# Patient Record
Sex: Male | Born: 1940 | Race: White | Hispanic: No | Smoking: Current some day smoker
Health system: Southern US, Community
[De-identification: ages and names within clinical notes are randomized; demographics above are authoritative.]

## PROBLEM LIST (undated history)

## (undated) DIAGNOSIS — R112 Nausea with vomiting, unspecified: Secondary | ICD-10-CM

## (undated) DIAGNOSIS — R079 Chest pain, unspecified: Secondary | ICD-10-CM

## (undated) DIAGNOSIS — M79605 Pain in left leg: Principal | ICD-10-CM

## (undated) DIAGNOSIS — F329 Major depressive disorder, single episode, unspecified: Secondary | ICD-10-CM

## (undated) DIAGNOSIS — L089 Local infection of the skin and subcutaneous tissue, unspecified: Secondary | ICD-10-CM

## (undated) DIAGNOSIS — I10 Essential (primary) hypertension: Secondary | ICD-10-CM

## (undated) DIAGNOSIS — I209 Angina pectoris, unspecified: Secondary | ICD-10-CM

## (undated) DIAGNOSIS — N189 Chronic kidney disease, unspecified: Secondary | ICD-10-CM

## (undated) DIAGNOSIS — Z8719 Personal history of other diseases of the digestive system: Secondary | ICD-10-CM

## (undated) DIAGNOSIS — E7800 Pure hypercholesterolemia, unspecified: Secondary | ICD-10-CM

## (undated) DIAGNOSIS — E785 Hyperlipidemia, unspecified: Secondary | ICD-10-CM

## (undated) DIAGNOSIS — N1831 Chronic kidney disease, stage 3a: Secondary | ICD-10-CM

## (undated) DIAGNOSIS — I4891 Unspecified atrial fibrillation: Secondary | ICD-10-CM

## (undated) DIAGNOSIS — K219 Gastro-esophageal reflux disease without esophagitis: Secondary | ICD-10-CM

## (undated) DIAGNOSIS — R351 Nocturia: Secondary | ICD-10-CM

## (undated) DIAGNOSIS — I499 Cardiac arrhythmia, unspecified: Secondary | ICD-10-CM

## (undated) DIAGNOSIS — I4892 Unspecified atrial flutter: Secondary | ICD-10-CM

## (undated) DIAGNOSIS — M1990 Unspecified osteoarthritis, unspecified site: Secondary | ICD-10-CM

## (undated) DIAGNOSIS — K5120 Ulcerative (chronic) proctitis without complications: Secondary | ICD-10-CM

## (undated) DIAGNOSIS — F32A Depression, unspecified: Secondary | ICD-10-CM

## (undated) DIAGNOSIS — Z9889 Other specified postprocedural states: Secondary | ICD-10-CM

## (undated) DIAGNOSIS — A389 Scarlet fever, uncomplicated: Secondary | ICD-10-CM

## (undated) DIAGNOSIS — M79604 Pain in right leg: Secondary | ICD-10-CM

## (undated) DIAGNOSIS — B958 Unspecified staphylococcus as the cause of diseases classified elsewhere: Secondary | ICD-10-CM

## (undated) DIAGNOSIS — R32 Unspecified urinary incontinence: Secondary | ICD-10-CM

## (undated) DIAGNOSIS — M4800 Spinal stenosis, site unspecified: Secondary | ICD-10-CM

## (undated) HISTORY — PX: ESOPHAGEAL DILATION: SHX303

## (undated) HISTORY — DX: Gastro-esophageal reflux disease without esophagitis: K21.9

## (undated) HISTORY — PX: INNER EAR SURGERY: SHX679

## (undated) HISTORY — DX: Scarlet fever, uncomplicated: A38.9

## (undated) HISTORY — PX: X-STOP IMPLANTATION: SHX2677

## (undated) HISTORY — PX: CATARACT EXTRACTION W/ INTRAOCULAR LENS  IMPLANT, BILATERAL: SHX1307

## (undated) HISTORY — DX: Pain in right leg: M79.604

## (undated) HISTORY — DX: Pain in right leg: M79.605

## (undated) HISTORY — DX: Ulcerative (chronic) proctitis without complications: K51.20

## (undated) HISTORY — DX: Unspecified atrial flutter: I48.92

## (undated) HISTORY — DX: Spinal stenosis, site unspecified: M48.00

## (undated) HISTORY — DX: Chest pain, unspecified: R07.9

## (undated) HISTORY — DX: Hyperlipidemia, unspecified: E78.5

## (undated) HISTORY — PX: KNEE ARTHROSCOPY: SUR90

## (undated) HISTORY — DX: Pure hypercholesterolemia, unspecified: E78.00

## (undated) HISTORY — DX: Essential (primary) hypertension: I10

## (undated) HISTORY — DX: Unspecified atrial fibrillation: I48.91

---
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
## 1960-06-03 DIAGNOSIS — I4891 Unspecified atrial fibrillation: Secondary | ICD-10-CM

## 1960-06-03 HISTORY — DX: Unspecified atrial fibrillation: I48.91

## 1995-06-04 HISTORY — PX: CARDIAC CATHETERIZATION: SHX172

## 1999-10-22 ENCOUNTER — Emergency Department (HOSPITAL_COMMUNITY): Admission: EM | Admit: 1999-10-22 | Discharge: 1999-10-22 | Payer: Self-pay | Admitting: Emergency Medicine

## 1999-10-30 ENCOUNTER — Encounter: Payer: Self-pay | Admitting: Family Medicine

## 1999-10-30 ENCOUNTER — Encounter: Admission: RE | Admit: 1999-10-30 | Discharge: 1999-10-30 | Payer: Self-pay | Admitting: Family Medicine

## 2005-06-03 HISTORY — PX: ANKLE RECONSTRUCTION: SHX1151

## 2008-03-02 ENCOUNTER — Observation Stay (HOSPITAL_COMMUNITY): Admission: EM | Admit: 2008-03-02 | Discharge: 2008-03-03 | Payer: Self-pay | Admitting: Emergency Medicine

## 2008-03-03 HISTORY — PX: CARDIAC CATHETERIZATION: SHX172

## 2009-06-03 HISTORY — PX: COLONOSCOPY: SHX174

## 2010-10-15 ENCOUNTER — Other Ambulatory Visit: Payer: Self-pay | Admitting: Family Medicine

## 2010-10-15 DIAGNOSIS — M79609 Pain in unspecified limb: Secondary | ICD-10-CM

## 2010-10-16 NOTE — Discharge Summary (Signed)
NAMEBENYAMIN, [name] NO.:  [id number]   MEDICAL RECORD NO.:  [id number]          PATIENT TYPE:  OBV   LOCATION:  2013                         FACILITY:  [facility]   PHYSICIAN:  Cassell Clement, M.D. DATE OF BIRTH:  09-Nov-1940   DATE OF ADMISSION:  03/02/2008  DATE OF DISCHARGE:  03/03/2008                               DISCHARGE SUMMARY   FINAL DIAGNOSES:  1. Chest pain, myocardial infarction ruled out.  2. Normal coronary arteries by cardiac catheterization.  3. Hyperlipidemia.  4. Ulcerative proctitis.  5. Hypertensive cardiovascular disease.  6. Past history of paroxysmal atrial flutter.   OPERATIONS PERFORMED:  Left heart cardiac catheterization and coronary  angiogram by Dr. Peter Swaziland on March 03, 2008.   HISTORY:  This 70 year old gentleman was admitted as an emergency on  March 02, 2008, after experiencing severe substernal chest pain,  dyspnea, and diaphoresis __________ at home after exertion.  He took  aspirin and 1 sublingual nitroglycerin, had the feeling of  partial  improvement.  He had had a cardiac catheterization in 1998, which was  normal.  He has a past history of hypertension and started on Coreg, and  is followed Dr. [name].  He also has a history of ulcerative  proctitis, and is followed by Dr. Sherin Quarry and is on tapering dose of  steroids.   PHYSICAL EXAMINATION:  On admission, essentially unremarkable.  Vital  signs were stable.   HOSPITAL COURSE:  The patient was admitted to telemetry.  EKG showed no  acute changes.  Cardiac enzymes showed no elevation.  Telemetry remained  stable.  On the day of admission, the patient underwent cardiac  catheterization by Dr. Peter Swaziland, which showed normal coronary  arteries and normal left ventricular function and normal ejection  fraction of 55% with no wall motion abnormalities.  The patient  tolerated procedure well.  His groin remained stable postop and he is  expected to be  discharged home improved in the evening of the cath to be  followed by Dr. [name] in followup.  His LDL is 129 with an HDL  of 33 and we are adding low-dose Lipitor to his regimen.   DISCHARGE MEDICATIONS:  1. Aspirin 81 mg daily.  2. Lipitor 20 mg daily.  3. Carvedilol 6.25 mg twice a day.  4. Requip 0.25 mg daily.  5. Prednisone 5 mg daily or as directed by Dr. Sherin Quarry.  6. Dipentum 1500 mg twice a day.  7. Multivitamin 1 daily.  8. Fish oil capsule 1000 mg daily.  9. Coenzyme Q10 daily.  10.Vitamin C daily.  11.Nitrostat __________ sublingually p.r.n. for chest pain.   CONDITION ON DISCHARGE:  Improved.           ______________________________  Cassell Clement, M.D.     TB/MEDQ  D:  03/03/2008  T:  03/04/2008  Job:  [id number]   cc:   Peter M. Swaziland, M.D.  Pam Drown, M.D.  Tasia Catchings, M.D.

## 2010-10-16 NOTE — H&P (Signed)
[name], [name] NO.:  [id number]   MEDICAL RECORD NO.:  [id number]          PATIENT TYPE:  INP   LOCATION:  2013                         FACILITY:  [facility]   PHYSICIAN:  Cassell Clement, M.D. DATE OF BIRTH:  1940/09/27   DATE OF ADMISSION:  03/02/2008  DATE OF DISCHARGE:                              HISTORY & PHYSICAL   CHIEF COMPLAINT:  Chest pain.   HISTORY:  This is a 70 year old married Caucasian gentleman admitted  with substernal chest pain.  He had been shoveling some gravel in his  yard this morning and became fatigued and dyspneic.  He stopped  shoveling, went into lie down and developed shortness of breath and  substernal chest pain.  He estimates the pain lasted several hours.  At  worst, the pain was at 6/10.  Family reports that the patient appeared  somewhat diaphoretic and a ashen in color.  He denied nausea, vomiting,  or any radiation to the arms or neck or to the back.  He took 324 mg of  aspirin and sublingual nitroglycerin, and the intensive pain dropped  from 6/10 to 2/10 and subsequently after arrival in the emergency room  by ambulance has disappeared altogether.  The patient does not have any  prior history of known coronary artery disease.  He did have a previous  cardiac catheterization in 1998, which did not show any significant  obstructive disease according to the patient.   The patient does have past history of hypertension and has been on  Coreg.  His cholesterol status is unknown.  He has a history of  ulcerative proctitis and Dr. Sherin Quarry follows him and he has been on a  tapering dose of prednisone.  The patient also reports that a year ago,  he had paroxysmal atrial flutter.  He went to the [facility]  and his flutter converted spontaneously while his physician was  listening to his heart.   FAMILY HISTORY:  [name] mother died in her late 44s and had a  history of hypertension.  [name] father died  at 44, and died of  prostate cancer.   SOCIAL HISTORY:  The patient is married.  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  He quit smoking cigarettes 40 years ago, but smokes a pipe.  He drinks occasional alcohol.   ALLERGIES:  1. PENICILLIN.  2. COMPAZINE.   PRESENT MEDICATIONS:  1. Carvedilol 6.25 mg twice a day.  2. ReQuip, generic 0.25 mg once a day.  3. Prednisone 5 mg daily.  4. Lorazepam p.r.n.  5. Dipentum 1500 mg daily.  6. Coenzyme Q10 200 mg daily.  7. Multivitamin 1 tablet daily.  8. Fish oil 1000 mg daily.  9. Aspirin 81 mg daily.   REVIEW OF SYSTEMS:  He has not had any cough or sputum production, or  hemoptysis.  He had no pleuritic chest pain.  Gastrointestinal function  has been normal with normal bowel movements and no visible blood.  As  noted, he  has been on a tapering dose prednisone initially 20 mg a day 4  weeks, then 15 for a week, then 10 for a week, and now 5 mg daily.  The  patient's genitourinary history reveals that he has had increasing  urinary frequency for the past week with decreased urine volume.  He has  also had a remote history of prostatitis a year or 2 ago.   PHYSICAL EXAMINATION:  VITAL SIGNS:  His blood pressure is 156/84,  initially and dropped to 121/78 after therapy.  HEENT:  Carotids are normal.  Jugular venous pressure normal.  Thyroid  normal.  CHEST:  Clear.  HEART:  No murmur, gallop, rub, or click.  ABDOMEN:  Soft and nontender.  Liver and spleen not enlarged.  EXTREMITIES:  Good pulses.  No flares.  No edema.   LABORATORY DATA:  His electrocardiogram shows sinus bradycardia and is  nonacute.  The chest x-ray shows no evidence of CHF and his heart size  is normal.  His initial cardiac enzymes are normal.   IMPRESSION:  1. Possible acute coronary syndrome with chest pain resolved.  2. Hypertensive cardiovascular disease.  3. Ulcerative proctitis,  presently on tapering steroids.  4. Past history of paroxysmal atrial flutter.  5. Possible urinary tract infection.   DISPOSITION:  Admit to Dr. Patty Sermons to Telemetry.  We will continue the  IV heparin, which has been started in the emergency room.  We will  continue his beta-blocker.  We will continue daily aspirin.  Will use  nitroglycerin p.r.n.  We will assess fasting lipids in a.m.  We will  anticipate cardiac catheterization tomorrow by Dr. Peter Swaziland, who  incidentally happens to be his wife's cardiologist as well.           ______________________________  Cassell Clement, M.D.     TB/MEDQ  D:  03/02/2008  T:  03/03/2008  Job:  [id number]   cc:   Peter M. Swaziland, M.D.  Pam Drown, M.D.  Tasia Catchings, M.D.

## 2010-10-18 ENCOUNTER — Ambulatory Visit
Admission: RE | Admit: 2010-10-18 | Discharge: 2010-10-18 | Disposition: A | Discharge status: Home / Self Care | Payer: Self-pay | Source: Ambulatory Visit | Attending: Family Medicine | Admitting: Family Medicine

## 2010-10-18 DIAGNOSIS — M79609 Pain in unspecified limb: Secondary | ICD-10-CM

## 2010-11-01 ENCOUNTER — Ambulatory Visit
Admission: RE | Admit: 2010-11-01 | Discharge: 2010-11-01 | Disposition: A | Discharge status: Home / Self Care | Payer: Medicare Other | Source: Ambulatory Visit | Attending: Neurosurgery | Admitting: Neurosurgery

## 2010-11-01 ENCOUNTER — Other Ambulatory Visit: Payer: Self-pay | Admitting: Neurosurgery

## 2010-11-01 DIAGNOSIS — M79606 Pain in leg, unspecified: Secondary | ICD-10-CM

## 2010-11-26 ENCOUNTER — Encounter (HOSPITAL_COMMUNITY)
Admission: RE | Admit: 2010-11-26 | Discharge: 2010-11-26 | Disposition: A | Discharge status: Home / Self Care | Payer: Medicare Other | Source: Ambulatory Visit | Attending: Neurosurgery | Admitting: Neurosurgery

## 2010-11-26 ENCOUNTER — Other Ambulatory Visit (HOSPITAL_COMMUNITY): Payer: Self-pay | Admitting: Neurosurgery

## 2010-11-26 ENCOUNTER — Ambulatory Visit (HOSPITAL_COMMUNITY)
Admission: RE | Admit: 2010-11-26 | Discharge: 2010-11-26 | Disposition: A | Discharge status: Home / Self Care | Payer: Medicare Other | Source: Ambulatory Visit | Attending: Neurosurgery | Admitting: Neurosurgery

## 2010-11-26 DIAGNOSIS — Z01810 Encounter for preprocedural cardiovascular examination: Secondary | ICD-10-CM | POA: Insufficient documentation

## 2010-11-26 DIAGNOSIS — Z01812 Encounter for preprocedural laboratory examination: Secondary | ICD-10-CM | POA: Insufficient documentation

## 2010-11-26 DIAGNOSIS — Z01818 Encounter for other preprocedural examination: Secondary | ICD-10-CM | POA: Insufficient documentation

## 2010-11-26 DIAGNOSIS — M48061 Spinal stenosis, lumbar region without neurogenic claudication: Secondary | ICD-10-CM

## 2010-11-26 DIAGNOSIS — M47814 Spondylosis without myelopathy or radiculopathy, thoracic region: Secondary | ICD-10-CM | POA: Insufficient documentation

## 2010-11-26 LAB — CBC
HCT: 39.5 % (ref 39.0–52.0)
MCHC: 34.7 g/dL (ref 30.0–36.0)
MCV: 93.4 fL (ref 78.0–100.0)
Platelets: 170 10*3/uL (ref 150–400)
RDW: 13.6 % (ref 11.5–15.5)

## 2010-11-26 LAB — BASIC METABOLIC PANEL
BUN: 22 mg/dL (ref 6–23)
Creatinine, Ser: 1.19 mg/dL (ref 0.50–1.35)
GFR calc Af Amer: >60 mL/min (ref >60)
GFR calc non Af Amer: >60 mL/min (ref >60)

## 2010-12-02 HISTORY — PX: LUMBAR LAMINECTOMY: SHX95

## 2010-12-04 ENCOUNTER — Inpatient Hospital Stay (HOSPITAL_COMMUNITY): Payer: Medicare Other

## 2010-12-04 ENCOUNTER — Inpatient Hospital Stay (HOSPITAL_COMMUNITY)
Admission: RE | Admit: 2010-12-04 | Discharge: 2010-12-05 | DRG: 491 | Disposition: A | Discharge status: Home / Self Care | Payer: Medicare Other | Source: Ambulatory Visit | Attending: Neurosurgery | Admitting: Neurosurgery

## 2010-12-04 DIAGNOSIS — I10 Essential (primary) hypertension: Secondary | ICD-10-CM | POA: Diagnosis present

## 2010-12-04 DIAGNOSIS — F17200 Nicotine dependence, unspecified, uncomplicated: Secondary | ICD-10-CM | POA: Diagnosis present

## 2010-12-04 DIAGNOSIS — M48062 Spinal stenosis, lumbar region with neurogenic claudication: Principal | ICD-10-CM | POA: Diagnosis present

## 2010-12-04 DIAGNOSIS — I4891 Unspecified atrial fibrillation: Secondary | ICD-10-CM | POA: Diagnosis present

## 2010-12-04 DIAGNOSIS — R339 Retention of urine, unspecified: Secondary | ICD-10-CM | POA: Diagnosis present

## 2010-12-06 NOTE — Op Note (Signed)
  NAME:  [name], [name] NO.:  [id number]  MEDICAL RECORD NO.:  [id number]  LOCATION:                                 FACILITY:  PHYSICIAN:  Hilda Lias, M.D.   DATE OF BIRTH:  1940-08-30  DATE OF PROCEDURE: DATE OF DISCHARGE:                              OPERATIVE REPORT   PREOPERATIVE DIAGNOSIS:  Lumbar stenosis, L3, L5, S1 with neurogenic claudication.  POSTOPERATIVE DIAGNOSES:  Lumbar stenosis, L3, L5, S1 with neurogenic claudication.  PROCEDURE:  Bilateral L3, L4, L5 laminectomy and foraminotomy. Microscope.  SURGEON:  Hilda Lias, MD.  ASSISTANT:  Danae Orleans. Venetia Maxon, MD.  CLINICAL HISTORY:  The patient is a 70 year old gentleman, complaining of back pain, worse to both legs, associated with weakness.  The pain is getting worse and he noticed that now with less walking.  He developed more pain.  He has arterial Doppler which was negative.  X-rays show severe stenosis at the level of L4-5 and moderate at the level of L3-4. Surgery was advised.  DESCRIPTION OF PROCEDURE:  The patient was taken to the OR, and after intubation, he was positioned in prone manner.  The back was cleaned with DuraPrep.  Drapes were applied.  Midline incision was made through the skin and subcutaneous tissue down to the muscle.  The muscle retracted laterally.  X-rays showed that the clamp was at the level of L3 and L5.  Then, with the Leksell, we removed the spinal process of L3, L4, L5 as well as the interspinous ligament.  Then with the help of the drill as well as the 2- and 3-mm Kerrison punch, we did bilateral laminectomy at L3, L4, L5.  The patient had quite a bit of thickening of the yellow ligament at those two level being the worse level of L4-5 with there was quite a bit of thin of the dura mater.  Decompression was done laterally and then with the help of the microscope we did a foraminotomy to decompress the L3, L4 and L5 nerve root.  At the end, we had  plenty of space with plenty of opening for the foramen.  Valsalva maneuver up to 50 was negative.  Then the area was irrigated.  Fentanyl and Depo-Medrol were left in the dura space and the wound was closed with Vicryl and Steri-Strips.          ______________________________ Hilda Lias, M.D.     EB/MEDQ  D:  12/04/2010  T:  12/05/2010  Job:  [id number]  Electronically Signed by Hilda Lias M.D. on 12/06/2010 01:32:06 PM

## 2010-12-06 NOTE — H&P (Signed)
  NAME:  [name], [name] NO.:  [id number]  MEDICAL RECORD NO.:  [id number]  LOCATION:                                 FACILITY:  PHYSICIAN:  Hilda Lias, M.D.   DATE OF BIRTH:  [birth year]  DATE OF ADMISSION: DATE OF DISCHARGE:                             HISTORY & PHYSICAL   [name] is a gentleman who came to my office about 5 weeks ago complaining of back pain radiating to both legs, which got worse with walking.  At the beginning, it was felt that it was secondary to the cholesterol medication that he was taking, but after he was switched to different medication, he continued to get worse.  The patient had a workup and because of finding, he is being admitted for surgery.  PAST MEDICAL HISTORY:  Negative.  PRIOR SURGERIES:  He has had knee surgery, cardiac catheterization, right femur surgery.  He is allergic to PENICILLIN and COMPAZINE.  He is taking Prilosec, Xanax, Coreg, lisinopril.  FAMILY HISTORY:  Positive for cancer of the prostate, congestive heart failure.  SOCIAL HISTORY:  He does not smoke.  He drinks socially.  He is 5 feet 9 inches, 240 pounds.  REVIEW OF SYSTEMS:  Positive for back pain, leg pain.  PHYSICAL EXAMINATION:  HEAD, EYES, EARS, NOSE, AND THROAT:  Normal. NECK:  Normal. LUNGS:  Clear. HEART:  Heart sounds normal. ABDOMEN:  Normal. EXTREMITIES:  Normal pulse. NEUROLOGIC:  Mental status normal.  Cranial nerves normal.  Strength with no dorsiflexion of both feet.  Straight leg raising is positive bilaterally at 60 degrees.  The lumbar spine MRI showed that he has a severe stenosis at the level of L3-L4, L4-L5.  The patient had arterial Doppler, which was negative.  CLINICAL IMPRESSION:  Lumbar stenosis with neurogenic claudication.  RECOMMENDATION:  The patient is being admitted for surgery.  Procedure would be bilateral L3-L4, L4-L5 laminectomy and foraminotomy.  There is a possibility that we might do a  posterolateral arthrodesis.  He knows the risk of the surgery including possibility of infection, CSF leak, worsening pain, and need for further surgery.          ______________________________ Hilda Lias, M.D.     EB/MEDQ  D:  12/04/2010  T:  12/04/2010  Job:  [id number]  Electronically Signed by Hilda Lias M.D. on 12/06/2010 01:32:03 PM

## 2010-12-13 ENCOUNTER — Other Ambulatory Visit: Payer: Self-pay | Admitting: Neurosurgery

## 2010-12-13 ENCOUNTER — Ambulatory Visit
Admission: RE | Admit: 2010-12-13 | Discharge: 2010-12-13 | Disposition: A | Discharge status: Home / Self Care | Payer: Medicare Other | Source: Ambulatory Visit | Attending: Neurosurgery | Admitting: Neurosurgery

## 2010-12-13 DIAGNOSIS — R51 Headache: Secondary | ICD-10-CM

## 2010-12-19 ENCOUNTER — Other Ambulatory Visit: Payer: Self-pay | Admitting: Otolaryngology

## 2010-12-19 DIAGNOSIS — H7090 Unspecified mastoiditis, unspecified ear: Secondary | ICD-10-CM

## 2010-12-21 ENCOUNTER — Ambulatory Visit
Admission: RE | Admit: 2010-12-21 | Discharge: 2010-12-21 | Disposition: A | Discharge status: Home / Self Care | Payer: Medicare Other | Source: Ambulatory Visit | Attending: Otolaryngology | Admitting: Otolaryngology

## 2010-12-21 DIAGNOSIS — H7090 Unspecified mastoiditis, unspecified ear: Secondary | ICD-10-CM

## 2011-03-04 LAB — CBC
HCT: 43.7
MCHC: 34
MCV: 96.7
Platelets: 169
RDW: 15.7 — ABNORMAL HIGH

## 2011-03-04 LAB — COMPREHENSIVE METABOLIC PANEL
Albumin: 3.3 — ABNORMAL LOW
BUN: 20
Calcium: 8.9
Chloride: 105
Creatinine, Ser: 1.11
Total Bilirubin: 1.7 — ABNORMAL HIGH
Total Protein: 5.6 — ABNORMAL LOW

## 2011-03-04 LAB — URINALYSIS, ROUTINE W REFLEX MICROSCOPIC
Nitrite: NEGATIVE
Specific Gravity, Urine: 1.008
pH: 7.5

## 2011-03-04 LAB — POCT I-STAT, CHEM 8
Calcium, Ion: 1.14
Chloride: 104
HCT: 46
TCO2: 30

## 2011-03-04 LAB — DIFFERENTIAL
Basophils Absolute: 0
Lymphocytes Relative: 17
Monocytes Absolute: 0.4
Neutro Abs: 5.4

## 2011-03-04 LAB — POCT CARDIAC MARKERS: Myoglobin, poc: 103

## 2011-03-04 LAB — APTT: aPTT: 26

## 2011-03-04 LAB — URINE CULTURE

## 2011-03-04 LAB — OCCULT BLOOD X 1 CARD TO LAB, STOOL: Fecal Occult Bld: NEGATIVE

## 2011-03-04 LAB — CK TOTAL AND CKMB (NOT AT ARMC)
CK, MB: 4.8 — ABNORMAL HIGH
Relative Index: 4.3 — ABNORMAL HIGH
Total CK: 111

## 2011-03-05 LAB — HEPARIN LEVEL (UNFRACTIONATED): Heparin Unfractionated: 1.11 — ABNORMAL HIGH

## 2011-03-05 LAB — CBC
MCHC: 34.6
Platelets: 148 — ABNORMAL LOW
RBC: 3.9 — ABNORMAL LOW
WBC: 9.8

## 2011-03-05 LAB — CARDIAC PANEL(CRET KIN+CKTOT+MB+TROPI)
Relative Index: INVALID
Total CK: 86

## 2011-03-05 LAB — C-REACTIVE PROTEIN: CRP: 0.1 — ABNORMAL LOW (ref <0.6)

## 2011-03-05 LAB — LIPID PANEL: Cholesterol: 181

## 2011-05-16 ENCOUNTER — Ambulatory Visit: Payer: Medicare Other | Attending: Family Medicine | Admitting: Physical Therapy

## 2011-05-16 DIAGNOSIS — M545 Low back pain, unspecified: Secondary | ICD-10-CM | POA: Insufficient documentation

## 2011-05-16 DIAGNOSIS — M25559 Pain in unspecified hip: Secondary | ICD-10-CM | POA: Insufficient documentation

## 2011-05-16 DIAGNOSIS — IMO0001 Reserved for inherently not codable concepts without codable children: Secondary | ICD-10-CM | POA: Insufficient documentation

## 2011-05-22 ENCOUNTER — Ambulatory Visit: Payer: Medicare Other

## 2011-05-29 ENCOUNTER — Ambulatory Visit: Payer: Medicare Other

## 2011-06-03 ENCOUNTER — Ambulatory Visit: Payer: Medicare Other

## 2011-06-05 ENCOUNTER — Ambulatory Visit: Payer: Medicare Other | Attending: Family Medicine | Admitting: Physical Therapy

## 2011-06-05 DIAGNOSIS — M545 Low back pain, unspecified: Secondary | ICD-10-CM | POA: Insufficient documentation

## 2011-06-05 DIAGNOSIS — IMO0001 Reserved for inherently not codable concepts without codable children: Secondary | ICD-10-CM | POA: Insufficient documentation

## 2011-06-05 DIAGNOSIS — M25559 Pain in unspecified hip: Secondary | ICD-10-CM | POA: Insufficient documentation

## 2011-06-10 ENCOUNTER — Ambulatory Visit: Payer: Medicare Other | Admitting: Physical Therapy

## 2011-06-12 ENCOUNTER — Ambulatory Visit: Payer: Medicare Other | Admitting: Physical Therapy

## 2011-06-17 ENCOUNTER — Ambulatory Visit: Payer: Medicare Other | Admitting: Physical Therapy

## 2011-06-19 ENCOUNTER — Ambulatory Visit: Payer: Medicare Other | Admitting: Physical Therapy

## 2011-06-24 ENCOUNTER — Encounter: Payer: Medicare Other | Admitting: Physical Therapy

## 2011-06-26 ENCOUNTER — Ambulatory Visit: Payer: Medicare Other | Admitting: Physical Therapy

## 2011-07-01 ENCOUNTER — Ambulatory Visit: Payer: Medicare Other | Admitting: Physical Therapy

## 2011-07-03 ENCOUNTER — Ambulatory Visit: Payer: Medicare Other | Admitting: Physical Therapy

## 2011-07-17 ENCOUNTER — Other Ambulatory Visit: Payer: Self-pay | Admitting: Orthopaedic Surgery

## 2011-07-17 DIAGNOSIS — M5410 Radiculopathy, site unspecified: Secondary | ICD-10-CM

## 2011-07-21 ENCOUNTER — Ambulatory Visit
Admission: RE | Admit: 2011-07-21 | Discharge: 2011-07-21 | Disposition: A | Discharge status: Home / Self Care | Payer: Medicare Other | Source: Ambulatory Visit | Attending: Orthopaedic Surgery | Admitting: Orthopaedic Surgery

## 2011-07-21 DIAGNOSIS — M5410 Radiculopathy, site unspecified: Secondary | ICD-10-CM

## 2011-07-21 MED ORDER — GADOBENATE DIMEGLUMINE 529 MG/ML IV SOLN
20.0000 mL | Freq: Once | INTRAVENOUS | Status: AC | PRN
  Administered 2011-07-21: 20 mL via INTRAVENOUS

## 2012-02-15 IMAGING — CT CT TEMPORAL BONES W/O CM
4 of 5 series · 17 of 30 positions shown, 19 images · non-contrast
Comparison: None.

CLINICAL DATA: Right ear mastoiditis.  Decreased hearing on the
right side.

CT TEMPORAL BONES WITHOUT CONTRAST
TECHNIQUE: Axial and coronal plane CT imaging of the petrous
temporal bones was performed with thin-collimation image
reconstruction.  No intravenous contrast was administered.
Multiplanar CT image reconstructions were also generated.

[Series 3: ax mag right · axial · 0.19mm/px · z∈[-18,+28]mm · 5 of 224 slices shown, 7 images]
[im 38/224  brain]
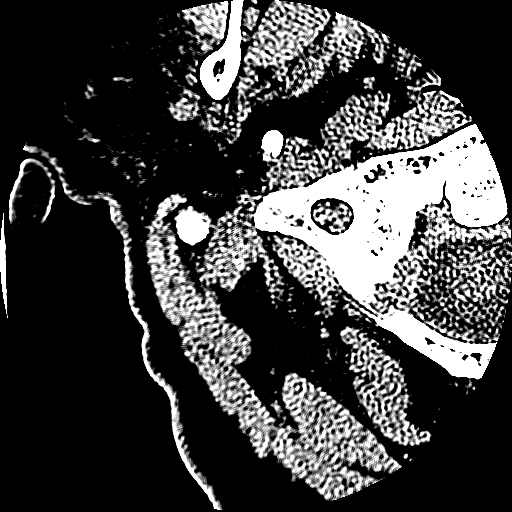
[im 38/224  bone]
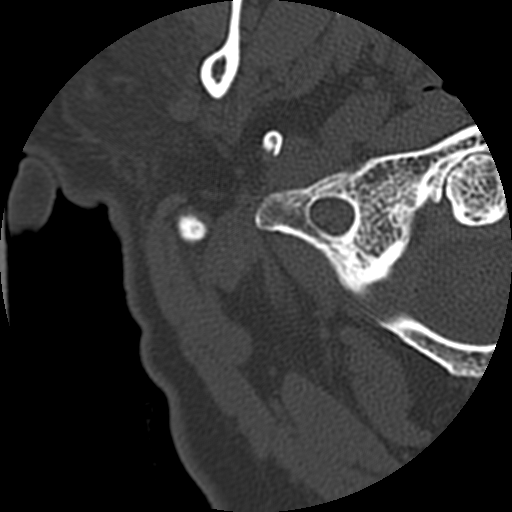
[im 75/224  bone]
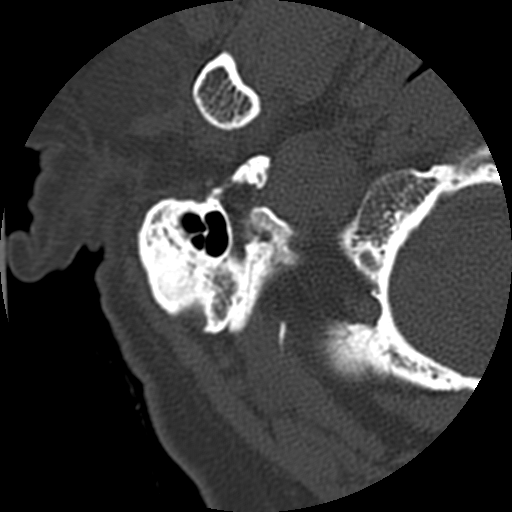
[im 112/224  bone]
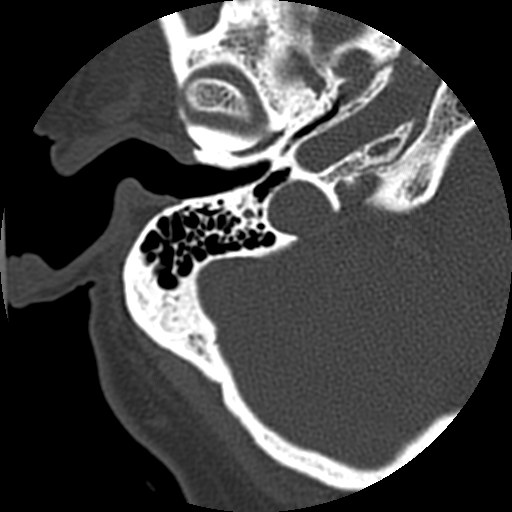
[im 149/224  bone]
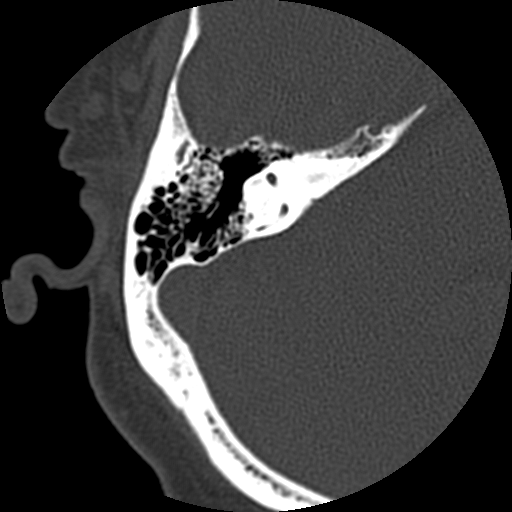
[im 186/224  brain]
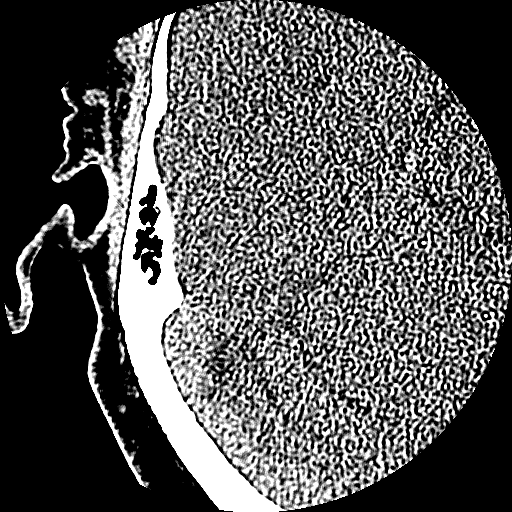
[im 186/224  bone]
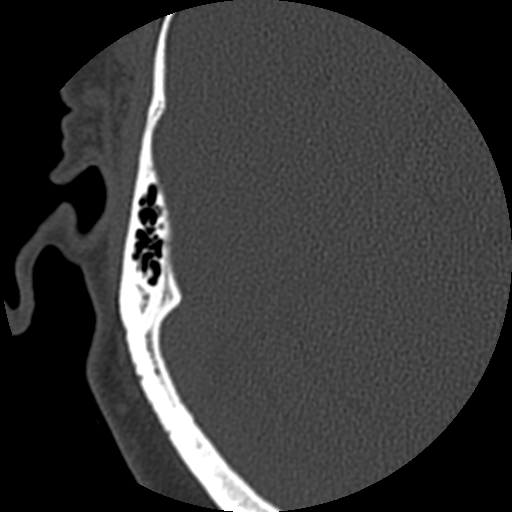

[Series 4: ax mag left · axial · 0.19mm/px · z∈[-16,+26]mm · 4 of 224 slices shown]
[im 45/224  bone]
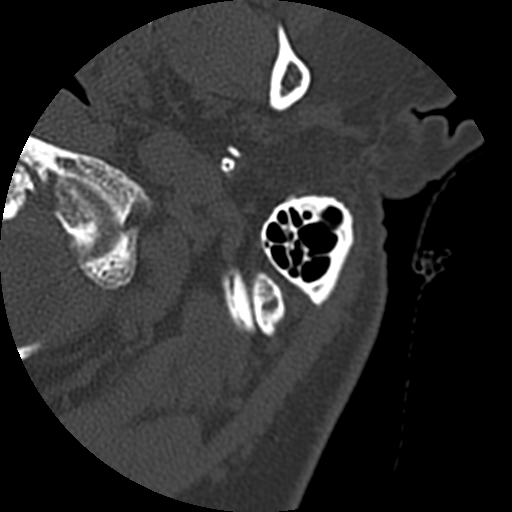
[im 90/224  bone]
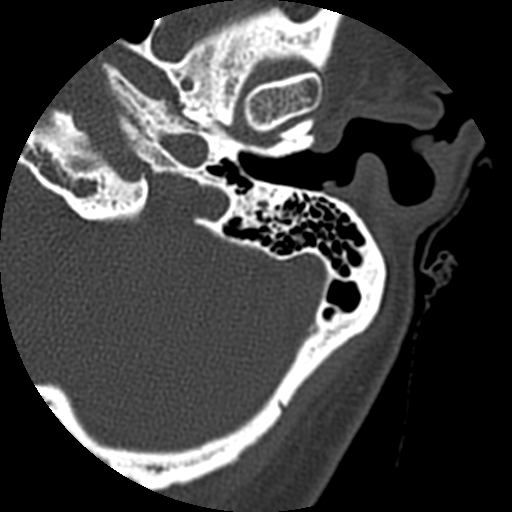
[im 134/224  bone]
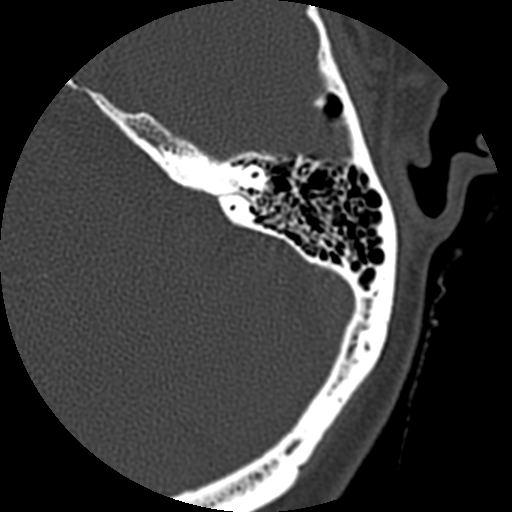
[im 179/224  bone]
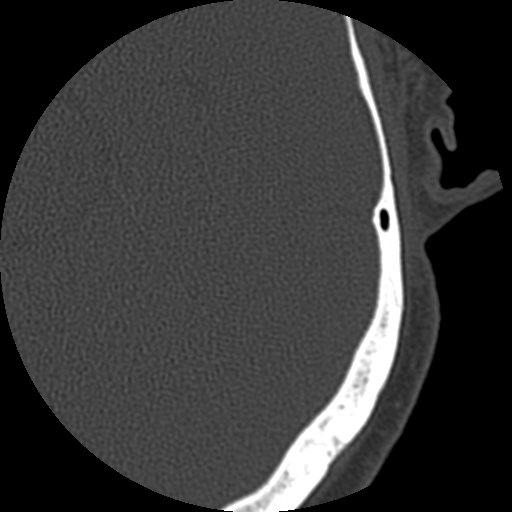

[Series 300: rt cor · coronal · 0.19mm/px · 5 of 266 slices shown]
[im 45/266  bone]
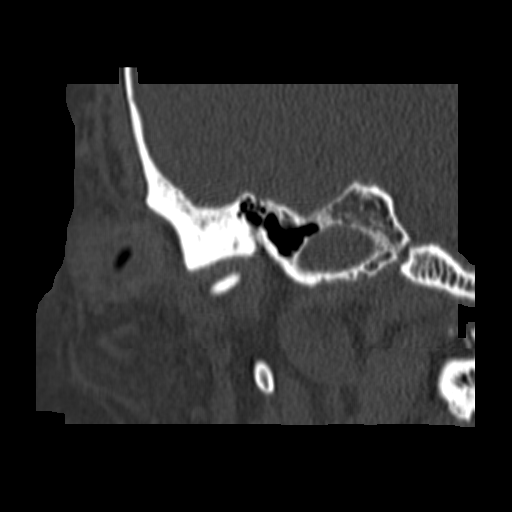
[im 89/266  bone]
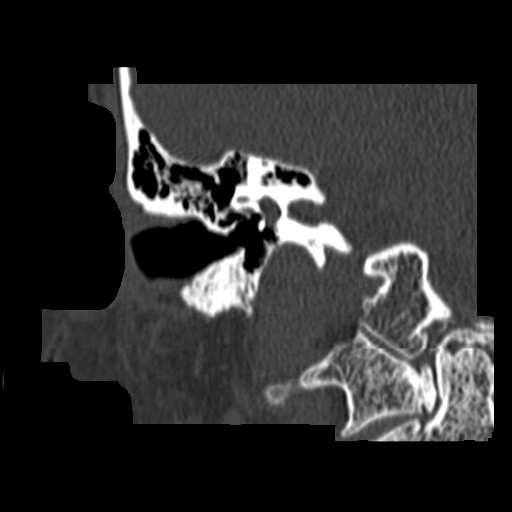
[im 133/266  bone]
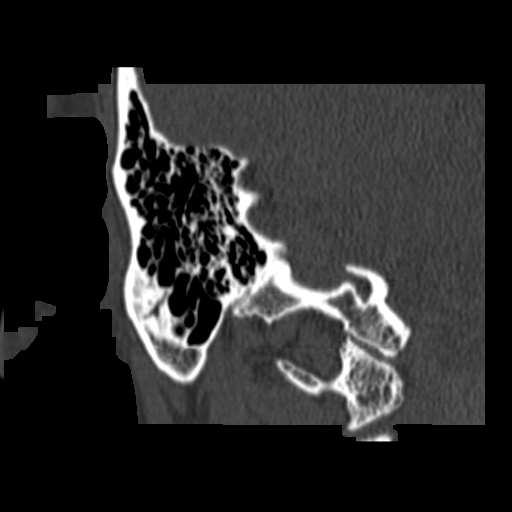
[im 177/266  bone]
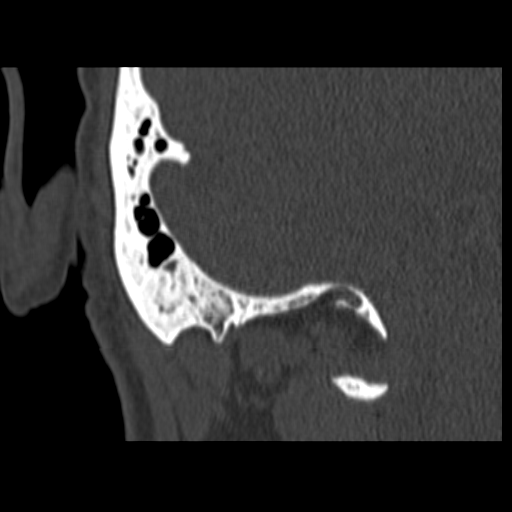
[im 221/266  bone]
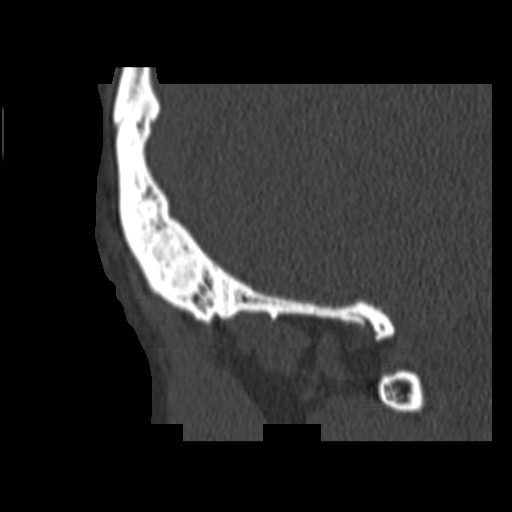

[Series 400: lt cor · coronal · 0.19mm/px · 3 of 264 slices shown]
[im 44/264  bone]
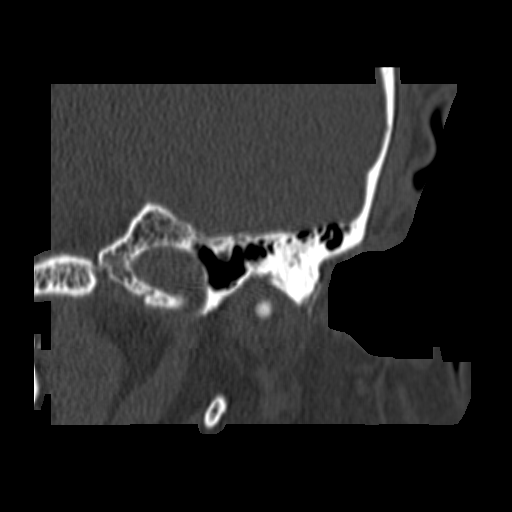
[im 88/264  bone]
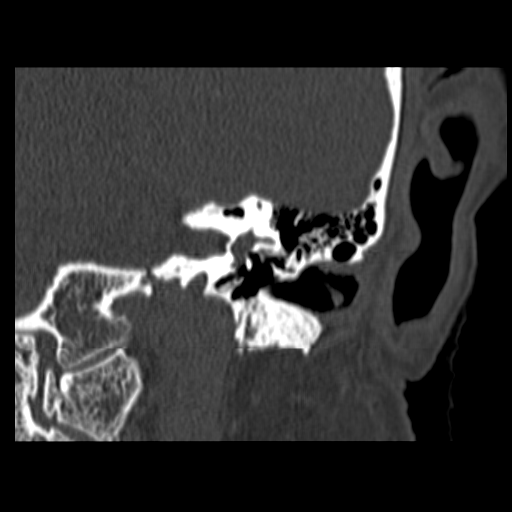
[im 132/264  bone]
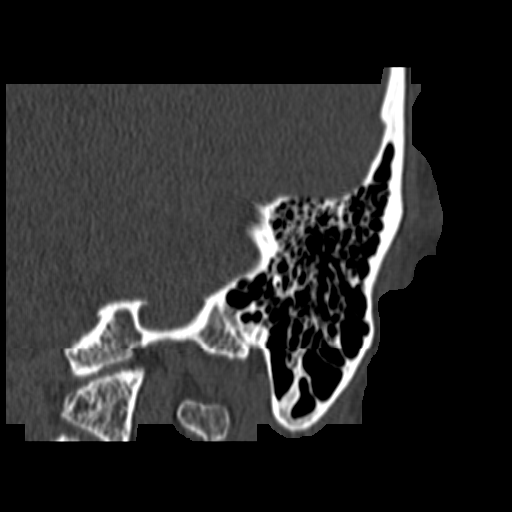

[17 of 30 positions shown; findings below may reference images not displayed]

FINDINGS: The right external auditory canal is clear.  The tympanic
membrane is visualized and appears to be intact.  The middle ear
ossicles are normally formed and articulating.  The round window is
patent.  Minimal fluid is present along the inferior aspect of the
right mastoid air cells posteriorly.  There is no coalescense or
sclerosis.  The vast majority of the mastoid air cells are clear.
The middle ear cavity is clear.  The inner ear structures are
normally formed.  Incidental note is made of a high riding jugular
bulb.  The sigmoid plate is intact.  The internal auditory canal
and vestibular aqueduct are within normal limits.

The left external auditory canal is within normal limits.  The
tympanic membrane is visualized and appears to be intact.  The
middle ear ossicles are normally formed and articulating.  The oval
window is patent.  The mastoid air cells and middle ear cavity are
clear.  The inner ear structures are normally formed.  The
vestibular aqueduct and internal auditory canal are within normal
limits.
IMPRESSION: 1.  Minimal fluid of the right mastoid air cells is likely
incidental.  This is unlikely to represent any significant
mastoiditis.
2.  No coalescense of air cells or sclerosis to suggest chronic
mastoiditis.
3.  Incidental note is made of a high riding jugular bulb on the
right.  The sigmoid plate is intact.
4.  Unremarkable left temporal bone.

## 2012-10-29 ENCOUNTER — Other Ambulatory Visit: Payer: Self-pay | Admitting: Otolaryngology

## 2012-10-29 DIAGNOSIS — H905 Unspecified sensorineural hearing loss: Secondary | ICD-10-CM

## 2012-10-29 DIAGNOSIS — H903 Sensorineural hearing loss, bilateral: Secondary | ICD-10-CM

## 2012-11-23 ENCOUNTER — Ambulatory Visit
Admission: RE | Admit: 2012-11-23 | Discharge: 2012-11-23 | Disposition: A | Discharge status: Home / Self Care | Payer: Medicare Other | Source: Ambulatory Visit | Attending: Otolaryngology | Admitting: Otolaryngology

## 2012-11-23 DIAGNOSIS — H903 Sensorineural hearing loss, bilateral: Secondary | ICD-10-CM

## 2012-11-23 DIAGNOSIS — H905 Unspecified sensorineural hearing loss: Secondary | ICD-10-CM

## 2012-11-23 MED ORDER — GADOBENATE DIMEGLUMINE 529 MG/ML IV SOLN
10.0000 mL | Freq: Once | INTRAVENOUS | Status: AC | PRN
  Administered 2012-11-23: 10 mL via INTRAVENOUS

## 2013-05-24 DIAGNOSIS — E782 Mixed hyperlipidemia: Secondary | ICD-10-CM | POA: Insufficient documentation

## 2013-05-24 DIAGNOSIS — F1920 Other psychoactive substance dependence, uncomplicated: Secondary | ICD-10-CM | POA: Insufficient documentation

## 2013-07-05 ENCOUNTER — Encounter (INDEPENDENT_AMBULATORY_CARE_PROVIDER_SITE_OTHER): Payer: Self-pay

## 2013-07-05 ENCOUNTER — Encounter: Payer: Self-pay | Admitting: Cardiology

## 2013-07-05 ENCOUNTER — Ambulatory Visit (INDEPENDENT_AMBULATORY_CARE_PROVIDER_SITE_OTHER): Payer: Medicare Other | Admitting: Cardiology

## 2013-07-05 VITALS — BP 120/74 | HR 65 | Ht 70.5 in | Wt 247.0 lb

## 2013-07-05 DIAGNOSIS — E782 Mixed hyperlipidemia: Secondary | ICD-10-CM

## 2013-07-05 DIAGNOSIS — M4800 Spinal stenosis, site unspecified: Secondary | ICD-10-CM | POA: Insufficient documentation

## 2013-07-05 DIAGNOSIS — N183 Chronic kidney disease, stage 3 unspecified: Secondary | ICD-10-CM | POA: Insufficient documentation

## 2013-07-05 DIAGNOSIS — I10 Essential (primary) hypertension: Secondary | ICD-10-CM

## 2013-07-05 DIAGNOSIS — K219 Gastro-esophageal reflux disease without esophagitis: Secondary | ICD-10-CM | POA: Insufficient documentation

## 2013-07-05 DIAGNOSIS — H9190 Unspecified hearing loss, unspecified ear: Secondary | ICD-10-CM | POA: Insufficient documentation

## 2013-07-05 DIAGNOSIS — R7301 Impaired fasting glucose: Secondary | ICD-10-CM | POA: Insufficient documentation

## 2013-07-05 DIAGNOSIS — F339 Major depressive disorder, recurrent, unspecified: Secondary | ICD-10-CM | POA: Insufficient documentation

## 2013-07-05 DIAGNOSIS — G4700 Insomnia, unspecified: Secondary | ICD-10-CM | POA: Insufficient documentation

## 2013-07-05 DIAGNOSIS — R1310 Dysphagia, unspecified: Secondary | ICD-10-CM | POA: Insufficient documentation

## 2013-07-05 DIAGNOSIS — K5120 Ulcerative (chronic) proctitis without complications: Secondary | ICD-10-CM | POA: Insufficient documentation

## 2013-07-05 DIAGNOSIS — R000 Tachycardia, unspecified: Secondary | ICD-10-CM | POA: Insufficient documentation

## 2013-07-05 DIAGNOSIS — M1990 Unspecified osteoarthritis, unspecified site: Secondary | ICD-10-CM | POA: Insufficient documentation

## 2013-07-05 MED ORDER — OMEGA-3-ACID ETHYL ESTERS 1 G PO CAPS: 2.0000 g | ORAL_CAPSULE | Freq: Two times a day (BID) | ORAL | Status: DC

## 2013-07-05 NOTE — Patient Instructions (Signed)
Stop fenofibrate  Start Lovaza 2 grams twice a day.  Work on getting your weight down and exercising as much as possible  I will see you in 3 months with fasting lab work.

## 2013-07-06 NOTE — Progress Notes (Signed)
[name] Date of Birth: [birth year] Medical Record #[id number]  History of Present Illness: [name] is seen at the request of Dr. Addison Lank for evaluation of hyperlipidemia. He is a 73 yo WM with history of mixed hyperlipidemia, HTN, and obesity. In review of his lab work cholesterol has ranged from 127-211 on different treatments with triglycerides of 134-300. LDL appears to be about 129 on no Rx. He has been intolerant to statins due to severe myalgias. This includes lipitor at a dose of 20 mg daily and Crestor 10 mg 3 days a week. Welchol apparently made no change in his lipid levels. Niacin was associated with severe flushing. Currently on fenofibrate. He was concerned about taking fish oil due to concerns about mercury. [name] has no history of vascular disease. He had normal cardiac caths in 1998 and 2009. No history of PAD or CVA/TIA. He does have difficulty with his weight and activity is limited by a number of arthritic/orthopedic problems. He apparently had afib in 1962 but no documented recurrence.  No current outpatient prescriptions on file prior to visit.   No current facility-administered medications on file prior to visit.    Allergies  Allergen Reactions  . Atorvastatin   . Gemfibrozil   . Monascus Purpureus Went Yeast   . Penicillins   . Prochlorperazine   . Rosuvastatin     Past Medical History  Diagnosis Date  . Chest pain     Myocardial infarction ruled out  . Hyperlipidemia   . Ulcerative proctitis   . Hypertensive cardiovascular disease   . Paroxysmal atrial flutter     History of Paroxysmal atrial flutter   . Scarlet fever 1945  . Atrial fibrillation 1962  . Esophageal reflux   . HTN (hypertension)     Past Surgical History  Procedure Laterality Date  . Cardiac catheterization  03/03/2008    Left heart cardiac catheterization and coronary  . Knee arthroscopy  1991, 1995  . Ankle reconstruction Right 2007    [place]  . Lumbar laminectomy  12/2010    . Dex insection Left 12/2012    ear  . Cardiac catheterization  1997    Dr Wynonia Lawman    History  Smoking status  . Former Smoker  Smokeless tobacco  . Not on file    Comment: quit 1980    History  Alcohol Use  . Yes    Comment: 1-3 weekly    Family History  Problem Relation Age of Onset  . Prostate cancer Father   . Heart attack Brother   . Kidney cancer Brother   . Brain cancer Brother     Review of Systems: As noted in HPI.  All other systems were reviewed and are negative.  Physical Exam: BP 120/74  Pulse 65  Ht 5' 10.5" (1.791 m)  Wt 247 lb (112.038 kg)  BMI 34.93 kg/m2 He is an obese WM in NAD.  HEENT: [place]/AT, PERRL, EOMI. Sclera are clear. Oropharynx is clear.  Neck: no JVD, bruits, thyromegaly, or adenopathy. Lungs: clear CV: RRR, normal S1-2, no gallop, murmur,click Abdomen: obese, soft, NT, no masses, bruits, or HSM Ext: no cyanosis or edema. Pulses are 2+. Skin: warm and dry, no xanthalaismas or tendon xanthomas. Neuro: alert and oriented x 3. CN II-XII intact  LABORATORY DATA: 05/24/13 Cholesterol-211, triglycerides-300, HDL-24, LDL-126 Complete chemistry panel-normal. Creatinine 1.33  Ecg: Normal  Assessment / Plan: 1. Mixed hyperlipidemia. Multiple drug intolerances including statins and niacin. Little benefit demonstrated with Welchol. While  calculated 10 year risk based on age, lipids, and history of HTN is high, I think this risk is lower based on normal cardiac cath in past. Options may include very low dose statin- ie crestor 5 mg twice a week, Zetia, fibrates, and fish oil. Lifestyle modification is very important here with need for weight loss and increased aerobic activity. I have recommended stopping fenofibrate for now. Will start Lovaza 4 grams daily. Will focus on weight loss. I will see in 3 months and we will check a fasting lipoprotein analysis at that time.   2. HTN- controlled.  3. Normal cardiac cath 2009.

## 2013-10-07 ENCOUNTER — Other Ambulatory Visit: Payer: Medicare Other

## 2013-10-08 ENCOUNTER — Other Ambulatory Visit (INDEPENDENT_AMBULATORY_CARE_PROVIDER_SITE_OTHER): Payer: Medicare Other

## 2013-10-08 DIAGNOSIS — I10 Essential (primary) hypertension: Secondary | ICD-10-CM

## 2013-10-08 DIAGNOSIS — E782 Mixed hyperlipidemia: Secondary | ICD-10-CM

## 2013-10-08 LAB — BASIC METABOLIC PANEL
BUN: 27 mg/dL — ABNORMAL HIGH (ref 6–23)
CALCIUM: 9 mg/dL (ref 8.4–10.5)
CO2: 28 meq/L (ref 19–32)
CREATININE: 1.5 mg/dL (ref 0.4–1.5)
Chloride: 106 mEq/L (ref 96–112)
GFR: 47.04 mL/min — ABNORMAL LOW (ref >60.00)
GLUCOSE: 98 mg/dL (ref 70–99)
Potassium: 4.8 mEq/L (ref 3.5–5.1)
Sodium: 140 mEq/L (ref 135–145)

## 2013-10-08 LAB — HEPATIC FUNCTION PANEL
ALK PHOS: 54 U/L (ref 39–117)
ALT: 19 U/L (ref 0–53)
AST: 23 U/L (ref 0–37)
Albumin: 3.5 g/dL (ref 3.5–5.2)
BILIRUBIN TOTAL: 0.5 mg/dL (ref 0.2–1.2)
Bilirubin, Direct: 0 mg/dL (ref 0.0–0.3)
Total Protein: 6.2 g/dL (ref 6.0–8.3)

## 2013-10-11 LAB — NMR LIPOPROFILE WITH LIPIDS
CHOLESTEROL, TOTAL: 214 mg/dL — AB (ref <200)
HDL PARTICLE NUMBER: 16.7 umol/L — AB (ref >=30.5)
HDL Size: 8.1 nm — ABNORMAL LOW (ref >=9.2)
HDL-C: 27 mg/dL — AB (ref >=40)
LDL CALC: 145 mg/dL — AB (ref <100)
LDL Particle Number: 3115 nmol/L — ABNORMAL HIGH (ref <1000)
LDL Size: 19.6 nm — ABNORMAL LOW (ref >20.5)
LP-IR SCORE: 73 — AB (ref <=45)
Large HDL-P: <1.3 umol/L — ABNORMAL LOW (ref >=4.8)
Large VLDL-P: 2.9 nmol/L — ABNORMAL HIGH (ref <=2.7)
Triglycerides: 210 mg/dL — ABNORMAL HIGH (ref <150)
VLDL Size: 47 nm — ABNORMAL HIGH (ref <=46.6)

## 2013-10-13 ENCOUNTER — Other Ambulatory Visit: Payer: Self-pay

## 2013-10-13 DIAGNOSIS — E782 Mixed hyperlipidemia: Secondary | ICD-10-CM

## 2013-10-13 MED ORDER — EZETIMIBE 10 MG PO TABS: 10.0000 mg | ORAL_TABLET | Freq: Every day | ORAL | Status: DC

## 2013-10-14 ENCOUNTER — Ambulatory Visit (INDEPENDENT_AMBULATORY_CARE_PROVIDER_SITE_OTHER): Payer: Medicare Other | Admitting: Cardiology

## 2013-10-14 ENCOUNTER — Encounter: Payer: Self-pay | Admitting: Cardiology

## 2013-10-14 VITALS — BP 137/87 | HR 80 | Ht 70.0 in | Wt 251.8 lb

## 2013-10-14 DIAGNOSIS — N183 Chronic kidney disease, stage 3 unspecified: Secondary | ICD-10-CM

## 2013-10-14 DIAGNOSIS — E782 Mixed hyperlipidemia: Secondary | ICD-10-CM

## 2013-10-14 DIAGNOSIS — I10 Essential (primary) hypertension: Secondary | ICD-10-CM

## 2013-10-14 NOTE — Patient Instructions (Signed)
Go ahead and start Zetia and keep your appointment with Gay Filler in the lipid clinic.  I will see you in 4 months.

## 2013-10-14 NOTE — Progress Notes (Signed)
[name] Date of Birth: 08-20-1940 Medical Record #[id number]  History of Present Illness: [name] is seen for follow up of hyperlipidemia. He is a 73 yo WM with history of mixed hyperlipidemia, HTN, and obesity. In review of his lab work cholesterol has ranged from 127-211 on different treatments with triglycerides of 134-300. LDL appears to be about 129 on no Rx. He has been intolerant to statins due to severe myalgias. This includes lipitor at a dose of 20 mg daily and Crestor 10 mg 3 days a week. Welchol apparently made no change in his lipid levels. Niacin was associated with severe flushing.  He was started on Lovaza in February. He has no history of vascular disease. He had normal cardiac caths in 1998 and 2009. No history of PAD or CVA/TIA. He does have difficulty with his weight and activity is limited by a number of arthritic/orthopedic problems. He apparently had afib in 1962 but no documented recurrence.  Current Outpatient Prescriptions on File Prior to Visit  Medication Sig Dispense Refill  . ALPRAZolam (XANAX) 0.25 MG tablet Take 0.25 mg by mouth at bedtime as needed for anxiety.      . Ascorbic Acid (VITAMIN C PO) Take 1 capsule by mouth daily.       Marland Kitchen aspirin 81 MG tablet Take 81 mg by mouth daily.      . carvedilol (COREG) 12.5 MG tablet Take 12.5 mg by mouth 2 (two) times daily with a meal.      . COENZYME Q-10 PO Take 1 capsule by mouth daily.       . cyclobenzaprine (FLEXERIL) 5 MG tablet Take 1 tablet by mouth as needed.       . ezetimibe (ZETIA) 10 MG tablet Take 1 tablet (10 mg total) by mouth daily.  30 tablet  6  . HYDROcodone-acetaminophen (NORCO/VICODIN) 5-325 MG per tablet Take 1 tablet by mouth at bedtime.       Marland Kitchen lisinopril (PRINIVIL,ZESTRIL) 20 MG tablet Take 20 mg by mouth daily.      . Magnesium 250 MG TABS Take 1 tablet by mouth daily.       . MULTIPLE VITAMIN PO Take 1 capsule by mouth daily.       . nitroGLYCERIN (NITROSTAT) 0.4 MG SL tablet Place 0.4  mg under the tongue every 5 (five) minutes as needed for chest pain.      Marland Kitchen omega-3 acid ethyl esters (LOVAZA) 1 G capsule Take 2 capsules (2 g total) by mouth 2 (two) times daily.  120 capsule  11  . omeprazole (PRILOSEC OTC) 20 MG tablet Take 20 mg by mouth daily.      . ondansetron (ZOFRAN) 4 MG tablet Take 4 mg by mouth every 8 (eight) hours as needed for nausea or vomiting.       No current facility-administered medications on file prior to visit.    Allergies  Allergen Reactions  . Prochlorperazine   . Atorvastatin   . Gemfibrozil   . Monascus Purpureus Went Yeast   . Penicillins   . Rosuvastatin     Past Medical History  Diagnosis Date  . Chest pain     Myocardial infarction ruled out  . Hyperlipidemia   . Ulcerative proctitis   . Hypertensive cardiovascular disease   . Paroxysmal atrial flutter     History of Paroxysmal atrial flutter   . Scarlet fever 1945  . Atrial fibrillation 1962  . Esophageal reflux   . HTN (hypertension)  Past Surgical History  Procedure Laterality Date  . Cardiac catheterization  03/03/2008    Left heart cardiac catheterization and coronary  . Knee arthroscopy  1991, 1995  . Ankle reconstruction Right 2007    [place]  . Lumbar laminectomy  12/2010  . Dex insection Left 12/2012    ear  . Cardiac catheterization  1997    Dr Wynonia Lawman    History  Smoking status  . Former Smoker  Smokeless tobacco  . Not on file    Comment: quit 1980    History  Alcohol Use  . Yes    Comment: 1-3 weekly    Family History  Problem Relation Age of Onset  . Prostate cancer Father   . Heart attack Brother   . Kidney cancer Brother   . Brain cancer Brother     Review of Systems: As noted in HPI.  All other systems were reviewed and are negative.  Physical Exam: BP 137/87  Pulse 80  Ht 5\' 10"  (1.778 m)  Wt 251 lb 12.8 oz (114.216 kg)  BMI 36.13 kg/m2 He is an obese WM in NAD.  HEENT: /AT, PERRL, EOMI. Sclera are clear. Oropharynx is  clear.  Neck: no JVD, bruits, thyromegaly, or adenopathy. Lungs: clear CV: RRR, normal S1-2, no gallop, murmur,click Abdomen: obese, soft, NT, no masses, bruits, or HSM Ext: no cyanosis or edema. Pulses are 2+. Skin: warm and dry, no xanthalaismas or tendon xanthomas. Neuro: alert and oriented x 3. CN II-XII intact  LABORATORY DATA: Lab Results  Component Value Date   WBC 7.6 11/26/2010   HGB 13.7 11/26/2010   HCT 39.5 11/26/2010   PLT 170 11/26/2010   GLUCOSE 98 10/08/2013   CHOL  Value: 181        ATP III CLASSIFICATION:  <200     mg/dL   Desirable  200-239  mg/dL   Borderline High  >=240    mg/dL   High 03/03/2008   TRIG 210* 10/08/2013   HDL 32* 03/03/2008   LDLCALC 145* 10/08/2013   ALT 19 10/08/2013   AST 23 10/08/2013   NA 140 10/08/2013   K 4.8 10/08/2013   CL 106 10/08/2013   CREATININE 1.5 10/08/2013   BUN 27* 10/08/2013   CO2 28 10/08/2013   INR 0.9 03/02/2008   Recent lipoprotein analysis reviewed in Epic. LDL particle number 3115. Small LDL particle number >2300. Triglycerides 210.   Ecg: Normal  Assessment / Plan: 1. Mixed hyperlipidemia. Multiple drug intolerances including statins and niacin. Little benefit demonstrated with Welchol. While calculated 10 year risk based on age, lipids, and history of HTN is high, I think this risk is lower based on normal cardiac cath in past. Tiglycerides improved with Lovaza. Still markedly elevated LDL particle number with predominant small particles. Lifestyle modification is stressed with need for weight loss and increased aerobic activity.Will start Zetia 10 mg daily. Continue  Lovaza 4 grams daily. Will refer to lipid clinic.   2. HTN- controlled.  3. Normal cardiac cath 2009.

## 2013-10-19 ENCOUNTER — Ambulatory Visit (INDEPENDENT_AMBULATORY_CARE_PROVIDER_SITE_OTHER): Payer: Medicare Other | Admitting: Pharmacist

## 2013-10-19 DIAGNOSIS — Z79899 Other long term (current) drug therapy: Secondary | ICD-10-CM

## 2013-10-19 DIAGNOSIS — E782 Mixed hyperlipidemia: Secondary | ICD-10-CM

## 2013-10-19 MED ORDER — ROSUVASTATIN CALCIUM 10 MG PO TABS: 10.0000 mg | ORAL_TABLET | ORAL | Status: DC

## 2013-10-19 NOTE — Patient Instructions (Signed)
1.  Continue Zetia 10 mg once daily. 2.  In 4 weeks, start Crestor 10 mg once weekly since you tolerated this in the past. 3.  Recheck cholesterol and liver in 3 months (01/11/14 AM- fasting), and see Ysidro Evert 2 days later to review 01/13/14 at 10:00 am.

## 2013-10-19 NOTE — Progress Notes (Signed)
Patient is a pleasant 73 y.o. WM referred to lipid clinic by Dr. Martinique given elevated LDL-P number (3,000 nmol/L) and inability to tolerate lipid lowering therapy.  Patient started on Zetia 10 mg qd one week ago and tolerating this well thus far.  Patient tells me that he has a h/o spinal stenosis which he is now seeing a neurologist in [place] for, and feels this may have contributed to his leg aches in the past, and may not all have been from statins.  He is not certain about this association however.  He is scheduled to get an MRI on his back next week by neurology.  He has a cath in 2010 which was negative for CAD.  He was able to tolerate Crestor once week it seems by PCP, and titrated slowly (2012).  ONce he got to three times per week, he had to stop due to muscle aches in his legs.  Dr. Martinique wanted to know if he qualified for PCSK-9 inhibitor study.  RF:  HTN, age, low HDL, obesity, LDL-P number of 3,000 - LDL goal at least < 100, and LDL-P number goal preferably < 1300 nmol/L Meds:  Zetia 10 mg qd (started one week ago), Lovaza 4 g/d. Intolerant:  Lipitor 20 mg qd, Crestor tiw, Lopid, NIacin, Welchol, red yeast rice  Exercise:  Currently on treadmill for 2-3 days per week for 30 minutes.  Trying to get up to 7 days per week, for at least 30 minutes per day. Social history:  Former smoker.  Drinks alcohol occasionally. Diet:  Eats reasonable low fat diet already.  Labs:   10/2013:  LDL-P number 3115, LDL 145, TG 210, HDL 27, TC 214, LFTs normal, Glucose 98, Scr 1.5 (Lovaza 4 g/d only).  Zetia was started soon after these labs were drawn.  Current Outpatient Prescriptions  Medication Sig Dispense Refill  . ACETAMINOPHEN PO Take 200 mg by mouth every 6 (six) hours as needed.      . ALPRAZolam (XANAX) 0.25 MG tablet Take 0.25 mg by mouth at bedtime as needed for anxiety.      Marland Kitchen amitriptyline (ELAVIL) 25 MG tablet Take 25 mg by mouth daily.      . Ascorbic Acid (VITAMIN C PO) Take 1 capsule  by mouth daily.       Marland Kitchen aspirin 81 MG tablet Take 81 mg by mouth daily.      . carvedilol (COREG) 12.5 MG tablet Take 12.5 mg by mouth 2 (two) times daily with a meal.      . COENZYME Q-10 PO Take 1 capsule by mouth daily.       . cyclobenzaprine (FLEXERIL) 5 MG tablet Take 1 tablet by mouth as needed.       . ezetimibe (ZETIA) 10 MG tablet Take 1 tablet (10 mg total) by mouth daily.  30 tablet  6  . HYDROcodone-acetaminophen (NORCO/VICODIN) 5-325 MG per tablet Take 1 tablet by mouth at bedtime.       Marland Kitchen lisinopril (PRINIVIL,ZESTRIL) 20 MG tablet Take 20 mg by mouth daily.      . Magnesium 250 MG TABS Take 1 tablet by mouth daily.       . MULTIPLE VITAMIN PO Take 1 capsule by mouth daily.       . nitroGLYCERIN (NITROSTAT) 0.4 MG SL tablet Place 0.4 mg under the tongue every 5 (five) minutes as needed for chest pain.      Marland Kitchen omega-3 acid ethyl esters (LOVAZA) 1 G capsule Take  2 capsules (2 g total) by mouth 2 (two) times daily.  120 capsule  11  . omeprazole (PRILOSEC OTC) 20 MG tablet Take 20 mg by mouth daily.      . ondansetron (ZOFRAN) 4 MG tablet Take 4 mg by mouth every 8 (eight) hours as needed for nausea or vomiting.       No current facility-administered medications for this visit.   Allergies  Allergen Reactions  . Prochlorperazine   . Atorvastatin   . Gemfibrozil   . Monascus Purpureus Went Yeast   . Penicillins   . Rosuvastatin     Failed Crestor tiw due to leg aches (tolerated Crestor qweek and biw)  . Welchol [Colesevelam Hcl]     Leg aches   Family History  Problem Relation Age of Onset  . Prostate cancer Father   . Heart attack Brother   . Kidney cancer Brother   . Brain cancer Brother     .

## 2013-10-19 NOTE — Assessment & Plan Note (Signed)
Patient doesn't qualify for SPIRE clinical trial as he doesn't have a h/o CAD, diabetes, PAD, or CVA.  Given he did tolerate Crestor once weekly in the past, and as this can get a 20% LDL reduction, will plan on starting this again.  He recently started Zetia which can also get a 20% reduction, and he is tolerating this well also.  Patient to increase treadmill frequency, and continue Zetia 10 mg qd and Lovaza daily for now.  In 4 weeks, he is to start Crestor 10 mg once weekly (samples given to start).  Will recheck NMR in 3 months, and if LDL-P still elevated, could consider increasing Crestor or adding PCSK-9 inhibitor (if on the market) if needed.

## 2013-10-23 ENCOUNTER — Encounter: Payer: Self-pay | Admitting: Cardiology

## 2014-01-11 ENCOUNTER — Other Ambulatory Visit (INDEPENDENT_AMBULATORY_CARE_PROVIDER_SITE_OTHER): Payer: Medicare Other

## 2014-01-11 DIAGNOSIS — E782 Mixed hyperlipidemia: Secondary | ICD-10-CM

## 2014-01-11 DIAGNOSIS — Z79899 Other long term (current) drug therapy: Secondary | ICD-10-CM

## 2014-01-11 LAB — HEPATIC FUNCTION PANEL
ALT: 18 U/L (ref 0–53)
AST: 19 U/L (ref 0–37)
Albumin: 3.6 g/dL (ref 3.5–5.2)
Alkaline Phosphatase: 50 U/L (ref 39–117)
BILIRUBIN TOTAL: 0.7 mg/dL (ref 0.2–1.2)
Bilirubin, Direct: 0.1 mg/dL (ref 0.0–0.3)
Total Protein: 6.4 g/dL (ref 6.0–8.3)

## 2014-01-12 LAB — NMR LIPOPROFILE WITH LIPIDS
Cholesterol, Total: 135 mg/dL (ref <200)
HDL PARTICLE NUMBER: 20.7 umol/L — AB (ref >=30.5)
HDL Size: 8.4 nm — ABNORMAL LOW (ref >=9.2)
HDL-C: 24 mg/dL — AB (ref >=40)
LDL CALC: 68 mg/dL (ref <100)
LDL Particle Number: 1439 nmol/L — ABNORMAL HIGH (ref <1000)
LDL SIZE: 19.5 nm — AB (ref >20.5)
LP-IR SCORE: 67 — AB (ref <=45)
Large HDL-P: <1.3 umol/L — ABNORMAL LOW (ref >=4.8)
Large VLDL-P: 3.1 nmol/L — ABNORMAL HIGH (ref <=2.7)
Small LDL Particle Number: 1300 nmol/L — ABNORMAL HIGH (ref <=527)
TRIGLYCERIDES: 214 mg/dL — AB (ref <150)
VLDL Size: 44.8 nm (ref <=46.6)

## 2014-01-13 ENCOUNTER — Ambulatory Visit (INDEPENDENT_AMBULATORY_CARE_PROVIDER_SITE_OTHER): Payer: Medicare Other | Admitting: Pharmacist

## 2014-01-13 VITALS — Wt 247.0 lb

## 2014-01-13 DIAGNOSIS — Z79899 Other long term (current) drug therapy: Secondary | ICD-10-CM

## 2014-01-13 DIAGNOSIS — E782 Mixed hyperlipidemia: Secondary | ICD-10-CM

## 2014-01-13 MED ORDER — ROSUVASTATIN CALCIUM 10 MG PO TABS: ORAL_TABLET | ORAL | Status: DC

## 2014-01-13 NOTE — Progress Notes (Signed)
Patient is a pleasant 73 y.o. WM referred to lipid clinic by Dr. Martinique given elevated LDL-P number (3,000 nmol/L) and inability to tolerate lipid lowering therapy.  Patient added Crestor 10 mg once weekly to his Zetia and Lovaza in 10/2013, and is tolerating this regimen well.  He denies any muscle aches or joint stiffness.  Patient tells me that he has a h/o spinal stenosis which he is now seeing a neurologist in [place] for, and feels this may have contributed to his leg aches in the past, and may not all have been from statins.  He is not certain about this association however, but tolerating Crestor weekly well.  He has a cath in 2010 which was negative for CAD.  In the past he was able to tolerate Crestor 1-2 times per week, but once he got to three times per week, he had to stop due to muscle aches in his legs. He doesn't qualify for PCSK-9 inhibitor use.  RF:  HTN, age, low HDL, obesity, LDL-P number of 3,000 - LDL goal at least < 100, and LDL-P number goal preferably < 1300 nmol/L Meds:  Zetia 10 mg qd, Crestor 10 mg once weekly, Lovaza 4 g/d. Intolerant:  Lipitor 20 mg qd, Crestor tiw, Lopid, NIacin, Welchol, red yeast rice  Exercise:  Was doingtreadmill for 2-3 days per week for 30 minutes, however due to ankle pain from a fracture 8 years ago, he is limited to only 5 minutes per day.  Social history:  Former smoker.  Drinks alcohol occasionally. Diet:  Eats reasonable low fat diet already, however admits to eating a lot more carbs and sugar lately, which may explain his TG remaining > 200 mg/dL despite adding zetia and statin.  Labs:   01/2014:  LDL-P number 1439, LDL 68, TG 214, HDL 24, LFTs normal (Crestor 10 mg once weekly, Zetia 10 mg qd, Lovaza 4 g/d) 10/2013:  LDL-P number 3115, LDL 145, TG 210, HDL 27, TC 214, LFTs normal, Glucose 98, Scr 1.5 (Lovaza 4 g/d only).  Zetia was started soon after these labs were drawn.  Current Outpatient Prescriptions  Medication Sig Dispense Refill  .  ACETAMINOPHEN PO Take 200 mg by mouth every 6 (six) hours as needed.      . ALPRAZolam (XANAX) 0.25 MG tablet Take 0.25 mg by mouth at bedtime as needed for anxiety.      Marland Kitchen amitriptyline (ELAVIL) 25 MG tablet Take 25 mg by mouth daily.      . Ascorbic Acid (VITAMIN C PO) Take 1 capsule by mouth daily.       Marland Kitchen aspirin 81 MG tablet Take 81 mg by mouth daily.      . carvedilol (COREG) 12.5 MG tablet Take 12.5 mg by mouth 2 (two) times daily with a meal.      . COENZYME Q-10 PO Take 1 capsule by mouth daily.       . cyclobenzaprine (FLEXERIL) 5 MG tablet Take 1 tablet by mouth as needed.       . ezetimibe (ZETIA) 10 MG tablet Take 1 tablet (10 mg total) by mouth daily.  30 tablet  6  . HYDROcodone-acetaminophen (NORCO/VICODIN) 5-325 MG per tablet Take 1 tablet by mouth at bedtime.       Marland Kitchen lisinopril (PRINIVIL,ZESTRIL) 20 MG tablet Take 20 mg by mouth daily.      . Magnesium 250 MG TABS Take 1 tablet by mouth daily.       . MULTIPLE VITAMIN PO Take  1 capsule by mouth daily.       . nitroGLYCERIN (NITROSTAT) 0.4 MG SL tablet Place 0.4 mg under the tongue every 5 (five) minutes as needed for chest pain.      Marland Kitchen omega-3 acid ethyl esters (LOVAZA) 1 G capsule Take 2 capsules (2 g total) by mouth 2 (two) times daily.  120 capsule  11  . omeprazole (PRILOSEC OTC) 20 MG tablet Take 20 mg by mouth daily.      . ondansetron (ZOFRAN) 4 MG tablet Take 4 mg by mouth every 8 (eight) hours as needed for nausea or vomiting.      . rosuvastatin (CRESTOR) 10 MG tablet Take 1 tablet (10 mg total) by mouth once a week.  90 tablet  3   No current facility-administered medications for this visit.   Allergies  Allergen Reactions  . Prochlorperazine   . Atorvastatin   . Gemfibrozil   . Monascus Purpureus Went Yeast   . Penicillins   . Rosuvastatin     Failed Crestor tiw due to leg aches (tolerated Crestor qweek and biw)  . Welchol [Colesevelam Hcl]     Leg aches   Family History  Problem Relation Age of Onset   . Prostate cancer Father   . Heart attack Brother   . Kidney cancer Brother   . Brain cancer Brother     .

## 2014-01-13 NOTE — Assessment & Plan Note (Signed)
Patient excited to see great improvement, and is asking if he can increase to Crestor twice weekly as he would like to get LDL-P number to < 1300.  He tolerated crestor biw in past, so I think this is a appropriate dose change.  Will continue Zetia + Lovaza, and increase Crestor to 10 mg twice weekly.  Will likely not need to titrate medication further in the future given he has responded so well to this combination.  Will recheck NMR / Liver in 4 months, and see me a few days later.  He agrees to try and limit carbs/sugar between now and next visit.

## 2014-01-13 NOTE — Patient Instructions (Signed)
1.  Continue Zetia 10 mg once daily and Lovaza 4 per day. 2.  Increase Crestor 10 mg to twice weekly (Monday / Thursday) 3.  Continue to walk as much as ankle can tolerate. 4.  Low fat / low sugar diet encouraged. 5.  Recheck cholesterol and liver in 4 months (05/09/14) and see Ysidro Evert 3 days later (05/12/14 at 10:30 am)

## 2014-02-01 ENCOUNTER — Ambulatory Visit (INDEPENDENT_AMBULATORY_CARE_PROVIDER_SITE_OTHER): Payer: Medicare Other | Admitting: Cardiology

## 2014-02-01 ENCOUNTER — Encounter: Payer: Self-pay | Admitting: Cardiology

## 2014-02-01 VITALS — Ht 70.0 in | Wt 252.1 lb

## 2014-02-01 DIAGNOSIS — N183 Chronic kidney disease, stage 3 unspecified: Secondary | ICD-10-CM

## 2014-02-01 DIAGNOSIS — M48061 Spinal stenosis, lumbar region without neurogenic claudication: Secondary | ICD-10-CM

## 2014-02-01 DIAGNOSIS — I10 Essential (primary) hypertension: Secondary | ICD-10-CM

## 2014-02-01 DIAGNOSIS — IMO0001 Reserved for inherently not codable concepts without codable children: Secondary | ICD-10-CM

## 2014-02-01 DIAGNOSIS — Z0389 Encounter for observation for other suspected diseases and conditions ruled out: Secondary | ICD-10-CM

## 2014-02-01 DIAGNOSIS — M4807 Spinal stenosis, lumbosacral region: Secondary | ICD-10-CM

## 2014-02-01 MED ORDER — LISINOPRIL 10 MG PO TABS
10.0000 mg | ORAL_TABLET | Freq: Every day | ORAL | Status: DC
Start: 2014-02-01 — End: 2014-05-12

## 2014-02-01 NOTE — Assessment & Plan Note (Signed)
Some orthostatic symptoms

## 2014-02-01 NOTE — Progress Notes (Signed)
02/01/2014 [name]   1940-06-18  [id number]  Primary Physicia MCNEILL,WENDY, MD Primary Cardiologist: Dr Martinique  HPI:  73 y/o with a history of normal coronaries in 1998 and 2009. He has dyslipidemia and problems with statin intolerance although this actually may have been secondary to spinal stenosis. He is being followed at the lipid clinic and has had marked improvement in his lipid profile. He has been unable to loose wgt secondary to his back pain. Unfortunately he feels like "the injections aren't helping anymore". He has a lot of pain at night.            He is here today for routine follow up. He relates a history of orthostatic episodes. His wife is an Therapist, sports and has taken his B/P during these episodes and found it to be low -"90". He has not had syncope and he denies chest pain or unusual dyspnea.    Current Outpatient Prescriptions  Medication Sig Dispense Refill  . ACETAMINOPHEN PO Take 200 mg by mouth every 6 (six) hours as needed.      . ALPRAZolam (XANAX) 0.25 MG tablet Take 0.25 mg by mouth at bedtime as needed for anxiety.      Marland Kitchen amitriptyline (ELAVIL) 25 MG tablet Take 25 mg by mouth daily.      . Ascorbic Acid (VITAMIN C PO) Take 1 capsule by mouth daily.       Marland Kitchen aspirin 81 MG tablet Take 81 mg by mouth daily.      . carvedilol (COREG) 12.5 MG tablet Take 12.5 mg by mouth 2 (two) times daily with a meal.      . COENZYME Q-10 PO Take 1 capsule by mouth daily.       . cyclobenzaprine (FLEXERIL) 5 MG tablet Take 1 tablet by mouth as needed.       . ezetimibe (ZETIA) 10 MG tablet Take 1 tablet (10 mg total) by mouth daily.  30 tablet  6  . gabapentin (NEURONTIN) 100 MG capsule Take 1 capsule by mouth 3 (three) times daily as needed.      Marland Kitchen HYDROcodone-acetaminophen (NORCO/VICODIN) 5-325 MG per tablet Take 1 tablet by mouth at bedtime.       Marland Kitchen lisinopril (PRINIVIL,ZESTRIL) 10 MG tablet Take 1 tablet (10 mg total) by mouth daily.  30 tablet  11  . Magnesium 250 MG TABS Take  1 tablet by mouth daily.       . MULTIPLE VITAMIN PO Take 1 capsule by mouth daily.       . nitroGLYCERIN (NITROSTAT) 0.4 MG SL tablet Place 0.4 mg under the tongue every 5 (five) minutes as needed for chest pain.      Marland Kitchen omega-3 acid ethyl esters (LOVAZA) 1 G capsule Take 2 capsules (2 g total) by mouth 2 (two) times daily.  120 capsule  11  . omeprazole (PRILOSEC OTC) 20 MG tablet Take 20 mg by mouth daily.      . ondansetron (ZOFRAN) 4 MG tablet Take 4 mg by mouth every 8 (eight) hours as needed for nausea or vomiting.      . rosuvastatin (CRESTOR) 10 MG tablet Take 1 tablet twice weekly  90 tablet  1   No current facility-administered medications for this visit.    Allergies  Allergen Reactions  . Prochlorperazine   . Atorvastatin   . Gemfibrozil   . Monascus Purpureus Went Yeast   . Penicillins   . Rosuvastatin     Failed Crestor  tiw due to leg aches (tolerated Crestor qweek and biw)  . Welchol [Colesevelam Hcl]     Leg aches    History   Social History  . Marital Status: Married    Spouse Name: N/A    Number of Children: N/A  . Years of Education: N/A   Occupational History  . Not on file.   Social History Main Topics  . Smoking status: Former Research scientist (life sciences)  . Smokeless tobacco: Not on file     Comment: quit 1980  . Alcohol Use: Yes     Comment: 1-3 weekly  . Drug Use: No  . Sexual Activity: Not on file   Other Topics Concern  . Not on file   Social History Narrative  . No narrative on file     Review of Systems: General: negative for chills, fever, night sweats or weight changes.  Cardiovascular: negative for chest pain, dyspnea on exertion, edema, orthopnea, palpitations, paroxysmal nocturnal dyspnea or shortness of breath Dermatological: negative for rash Respiratory: negative for cough or wheezing Urologic: negative for hematuria Abdominal: negative for nausea, vomiting, diarrhea, bright red blood per rectum, melena, or hematemesis Neurologic: negative for  visual changes, syncope, or dizziness All other systems reviewed and are otherwise negative except as noted above.    Height 5\' 10"  (1.778 m), weight 252 lb 1.6 oz (114.352 kg).  General appearance: alert, cooperative, no distress and moderately obese Neck: no carotid bruit and no JVD Lungs: clear to auscultation bilaterally Heart: regular rate and rhythm, S1, S2 normal, no murmur, click, rub or gallop  EKG NSR  ASSESSMENT AND PLAN:   Mixed hyperlipidemia Improved, followed by lipid clinic.  Hypertension Some orthostatic symptoms  CKD (chronic kidney disease), stage III Last Scr 1.5  Spinal stenosis Getting injections   Normal coronary arteries 1998, 2009   PLAN  I cut his Lisinopril back to 10 mg daily and asked him to take this QHS. I wanted to check a CBC and BMP but he assures me Dr Leonides Schanz will do this today when he sees her this afternoon. He'll follow up with Dr Martinique in 6 months. He asked if he could switch to regular fish oil (cost of Lovaza) and I told him this would be fine, just let Lipid Clinic know.   Nelle Sayed KPA-C 02/01/2014 9:32 AM

## 2014-02-01 NOTE — Assessment & Plan Note (Signed)
Last Scr 1.5

## 2014-02-01 NOTE — Patient Instructions (Signed)
Kerin Ransom, PA-C has recommended making the following medication changes:  DECREASE Lisinopril to 10 mg - take 1 tablet at bedtime.  Your physician recommends that you schedule a follow-up appointment in 6 months with Dr Martinique.

## 2014-02-01 NOTE — Assessment & Plan Note (Addendum)
Getting injections 

## 2014-02-01 NOTE — Assessment & Plan Note (Signed)
Improved, followed by lipid clinic.

## 2014-02-01 NOTE — Assessment & Plan Note (Signed)
1998, 2009 

## 2014-04-19 ENCOUNTER — Ambulatory Visit (INDEPENDENT_AMBULATORY_CARE_PROVIDER_SITE_OTHER): Payer: Medicare Other | Admitting: Neurology

## 2014-04-19 ENCOUNTER — Encounter: Payer: Self-pay | Admitting: Neurology

## 2014-04-19 VITALS — BP 125/78 | HR 80 | Ht 70.0 in | Wt 251.0 lb

## 2014-04-19 DIAGNOSIS — M79605 Pain in left leg: Secondary | ICD-10-CM

## 2014-04-19 DIAGNOSIS — G2581 Restless legs syndrome: Secondary | ICD-10-CM

## 2014-04-19 DIAGNOSIS — M79604 Pain in right leg: Secondary | ICD-10-CM

## 2014-04-19 MED ORDER — ROPINIROLE HCL 1 MG PO TABS: ORAL_TABLET | ORAL | Status: DC

## 2014-04-19 NOTE — Progress Notes (Signed)
PATIENT: [name] DOB: 1941-04-01  HISTORICAL  [name] is a 73 yo RH male, accompanied by his wife, referred by Dr. Jacelyn Grip, and his primary care physician Dr. Warner Mccreedy nail for evaluation of chronic low back pain, bilateral lower extremity muscle ache  He had a past medical history of hypertension, hyperlipidemia, presenting with chronic low back pain bilateral lower extremity muscle achy pain since 2010. Next  Initially it was thought due to his statin treatment, he has tried different statin without improving his symptoms,over the years, he also received multiple bilateral facet joint fluoroscopy guided injection without improving his symptoms. Next  In 2012, he was diagnosed with L4 and 5 lumbar stenosis, had lumbar laminectomy L3-4, L4-L5 by Dr. [name] in July 2012,without improving his symptoms,  He was seen by different specialists, tried different medications, this including Elavil, Xanax as needed, he is currently taking hydrocodone 5/325 mg 1-2 tablets every night, Xanax 0.25 milligram as needed, Flexeril 10 mg every night, gabapentin 600 mg every night without significant improvement,  During the daytime, he denies significant low back pain, or bilateral lower extremity pain, he does has history of right ankle fracture, mild gait difficulty due to right ankle pain, but starting at evening time, he began to noticed deep achy pain starting from midline low back, radiating along bilateral lateral leg, constant, he tends to pace around, has difficulty sleeping at nighttime, sometimes he has to got up take a hot bath couple times every night, put ice on his back, on his feet, only provide temporary relief.  He has also tried acupuncture, massage, TENS unit, without significant improvement, previously, he has a short trial of low-dose Requip, without improving his symptoms, no significant side effect noticed either.  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 Atorvastatin   . Compazine [Prochlorperazine Edisylate]   . Gemfibrozil   . Monascus Purpureus Went Yeast   . Penicillins   . Rosuvastatin     Failed Crestor tiw due to leg aches (tolerated Crestor qweek and biw)  . Welchol [Colesevelam Hcl]     Leg aches    HOME MEDICATIONS: Current Outpatient Prescriptions on File Prior to Visit  Medication Sig Dispense Refill  . ACETAMINOPHEN PO Take 200 mg by mouth every 6 (six) hours as needed.    . ALPRAZolam (XANAX) 0.25 MG tablet Take 0.25 mg by mouth at bedtime as needed for anxiety.    Marland Kitchen amitriptyline (ELAVIL) 25 MG tablet Take 25 mg by mouth daily.    . Ascorbic Acid (VITAMIN C PO) Take 1 capsule by mouth daily.     Marland Kitchen aspirin 81 MG tablet Take 81 mg by mouth daily.    Marland Kitchen  carvedilol (COREG) 12.5 MG tablet Take 12.5 mg by mouth 2 (two) times daily with a meal.    . COENZYME Q-10 PO Take 1 capsule by mouth daily.     . cyclobenzaprine (FLEXERIL) 5 MG tablet Take 1 tablet by mouth as needed.     . ezetimibe (ZETIA) 10 MG tablet Take 1 tablet (10 mg total) by mouth daily.  30 tablet 6  . gabapentin (NEURONTIN) 100 MG capsule Take 1 capsule by mouth 3 (three) times daily as needed.    Marland Kitchen HYDROcodone-acetaminophen (NORCO/VICODIN) 5-325 MG per tablet Take 1 tablet by mouth at bedtime.     Marland Kitchen lisinopril (PRINIVIL,ZESTRIL) 10 MG tablet Take 1 tablet (10 mg total) by mouth daily. 30 tablet 11  . Magnesium 250 MG TABS Take 1 tablet by mouth daily.     . MULTIPLE VITAMIN PO Take 1 capsule by mouth daily.     . nitroGLYCERIN (NITROSTAT) 0.4 MG SL tablet Place 0.4 mg under the tongue every 5 (five) minutes as needed for chest pain.    Marland Kitchen omega-3 acid ethyl esters (LOVAZA) 1 G capsule Take 2 capsules (2 g total) by mouth 2 (two) times daily. 120 capsule 11  . omeprazole (PRILOSEC OTC) 20 MG tablet Take 20 mg by mouth daily.    . ondansetron (ZOFRAN) 4 MG tablet Take 4 mg by mouth every 8 (eight) hours as needed for nausea or vomiting.    . rosuvastatin (CRESTOR) 10 MG tablet Take 1 tablet twice weekly 90 tablet 1   No current facility-administered medications on file prior to visit.    PAST MEDICAL HISTORY: Past Medical History  Diagnosis Date  . Chest pain     Myocardial infarction ruled out  . Hyperlipidemia   . Ulcerative proctitis   . Hypertensive cardiovascular disease   . Paroxysmal atrial flutter     History of Paroxysmal atrial flutter   . Scarlet fever 1945  . Atrial fibrillation 1962  . Esophageal reflux   . HTN (hypertension)   . Leg pain, bilateral     PAST SURGICAL HISTORY: Past Surgical History  Procedure Laterality Date  . Cardiac catheterization  03/03/2008    Left heart cardiac catheterization and coronary  . Knee arthroscopy  1991, 1995  . Ankle reconstruction Right 2007    [place]  . Lumbar laminectomy  12/2010  . Dex insection Left 12/2012    ear  . Cardiac catheterization  1997    Dr Wynonia Lawman    FAMILY HISTORY: Family History  Problem Relation Age of Onset  . Prostate cancer Father   . Heart attack Brother   . Kidney cancer  Brother   . Brain cancer Brother     SOCIAL HISTORY:  History   Social History  . Marital Status: Married    Spouse Name: Bethena Roys    Number of Children: 3  . Years of Education: college   Occupational History    Retired Network engineer job   Social History Main Topics  . Smoking status: Former Research scientist (life sciences)  . Smokeless tobacco: Never Used     Comment: quit 1980  . Alcohol Use: Yes     Comment: 1-3 weekly  . Drug Use: No  . Sexual Activity: Not on file   Other Topics Concern  . Not on file   Social History Narrative   Patient is retired and lives at home with his wife Bethena Roys.    Education college.   Caffeine one cup daily.     PHYSICAL  EXAM   Filed Vitals:   04/19/14 0803  BP: 125/78  Pulse: 80  Height: 5\' 10"  (1.778 m)  Weight: 251 lb (113.853 kg)    Not recorded      Body mass index is 36.01 kg/(m^2).   Generalized: In no acute distress  Neck: Supple, no carotid bruits   Cardiac: Regular rate rhythm  Pulmonary: Clear to auscultation bilaterally  Musculoskeletal: No deformity  Neurological examination  Mentation: Alert oriented to time, place, history taking, and causual conversation  Cranial nerve II-XII: Pupils were equal round reactive to light. Extraocular movements were full.  Visual field were full on confrontational test. Bilateral fundi were sharp.  Facial sensation and strength were normal. Hearing was intact to finger rubbing bilaterally. Uvula tongue midline.  Head turning and shoulder shrug and were normal and symmetric.Tongue protrusion into cheek strength was normal.  Motor: Normal tone, bulk and strength.  Sensory: Intact to fine touch, pinprick, mildly decreased vibratory sensation at toes,, and preserved proprioception at toes.  Coordination: Normal finger to nose, heel-to-shin bilaterally there was no truncal ataxia  Gait: Rising up from seated position without assistance, normal stance, without trunk ataxia, moderate stride, good arm swing,  smooth turning, able to perform tiptoe, and heel walking without difficulty.   Romberg signs: Negative  Deep tendon reflexes: Brachioradialis 1/1, biceps1/1, triceps 1/1, patellar 2/2, Achilles trace, plantar responses were flexor bilaterally.   DIAGNOSTIC DATA (LABS, IMAGING, TESTING) - I reviewed patient records, labs, notes, testing and imaging myself where available.  Lab Results  Component Value Date   WBC 7.6 11/26/2010   HGB 13.7 11/26/2010   HCT 39.5 11/26/2010   MCV 93.4 11/26/2010   PLT 170 11/26/2010      Component Value Date/Time   NA 140 10/08/2013 0739   K 4.8 10/08/2013 0739   CL 106 10/08/2013 0739   CO2 28 10/08/2013 0739   GLUCOSE 98 10/08/2013 0739   BUN 27* 10/08/2013 0739   CREATININE 1.5 10/08/2013 0739   CALCIUM 9.0 10/08/2013 0739   PROT 6.4 01/11/2014 0811   ALBUMIN 3.6 01/11/2014 0811   AST 19 01/11/2014 0811   ALT 18 01/11/2014 0811   ALKPHOS 50 01/11/2014 0811   BILITOT 0.7 01/11/2014 0811   GFRNONAA >60 11/26/2010 0817   GFRAA >60 11/26/2010 0817   Lab Results  Component Value Date   CHOL  03/03/2008    181        ATP III CLASSIFICATION:  <200     mg/dL   Desirable  200-239  mg/dL   Borderline High  >=240    mg/dL   High   HDL 32* 03/03/2008   LDLCALC 68 01/11/2014   TRIG 214* 01/11/2014   CHOLHDL 5.7 03/03/2008   ASSESSMENT AND PLAN  [name] is a 73 y.o. male complains of chronic low back pain, bilateral lower extremity achy pain, walking around improved symptoms, suggestive of restless leg syndromes, he had a history of L 4 5 stenosis, status post decompression, most recent repeat MRI in June 2015 showed no significant canal or foraminal stenosis, failed to improve multiple lumbar facet joint injection, previous electrodiagnostic study showed no evidence of peripheral neuropathy of all lumbar radiculopathy, he has tried and failed different medications, this including Xanax, Flexeril, Neurontin, hydrocodone, Elavil,  1, will  try titrating dose of Requip, 1 mg every day, titrating to 2 mg every night 2. Bring laboratory result at next visit 3. RTC in 3-4 weeks.   Marcial Pacas,  M.D. Ph.D.  [facility] [address], [place], [place] [address] [phone]

## 2014-05-09 ENCOUNTER — Other Ambulatory Visit (INDEPENDENT_AMBULATORY_CARE_PROVIDER_SITE_OTHER): Payer: Medicare Other | Admitting: *Deleted

## 2014-05-09 DIAGNOSIS — Z79899 Other long term (current) drug therapy: Secondary | ICD-10-CM

## 2014-05-09 DIAGNOSIS — E782 Mixed hyperlipidemia: Secondary | ICD-10-CM

## 2014-05-09 LAB — HEPATIC FUNCTION PANEL
ALK PHOS: 54 U/L (ref 39–117)
ALT: 15 U/L (ref 0–53)
AST: 21 U/L (ref 0–37)
Albumin: 4 g/dL (ref 3.5–5.2)
BILIRUBIN DIRECT: 0 mg/dL (ref 0.0–0.3)
BILIRUBIN TOTAL: 0.6 mg/dL (ref 0.2–1.2)
TOTAL PROTEIN: 6.5 g/dL (ref 6.0–8.3)

## 2014-05-11 LAB — NMR LIPOPROFILE WITH LIPIDS
CHOLESTEROL, TOTAL: 116 mg/dL (ref 100–199)
HDL PARTICLE NUMBER: 19.9 umol/L — AB (ref >=30.5)
HDL Size: 8.8 nm — ABNORMAL LOW (ref >=9.2)
HDL-C: 20 mg/dL — ABNORMAL LOW (ref >39)
LARGE VLDL-P: 11.5 nmol/L — AB (ref <=2.7)
LDL CALC: 56 mg/dL (ref 0–99)
LDL Particle Number: 962 nmol/L (ref <1000)
LDL SIZE: 19.7 nm (ref >=20.8)
LP-IR SCORE: 80 — AB (ref <=45)
SMALL LDL PARTICLE NUMBER: 784 nmol/L — AB (ref <=527)
Triglycerides: 199 mg/dL — ABNORMAL HIGH (ref 0–149)
VLDL Size: 58 nm — ABNORMAL HIGH (ref <=46.6)

## 2014-05-12 ENCOUNTER — Ambulatory Visit (INDEPENDENT_AMBULATORY_CARE_PROVIDER_SITE_OTHER): Payer: Medicare Other | Admitting: Pharmacist

## 2014-05-12 DIAGNOSIS — E782 Mixed hyperlipidemia: Secondary | ICD-10-CM

## 2014-05-12 NOTE — Progress Notes (Signed)
Patient is a pleasant 73 y.o. WM referred to lipid clinic by Dr. Martinique given elevated LDL-P number (3,000 nmol/L) and inability to tolerate lipid lowering therapy.  Patient added Crestor 10 mg once weekly to his Zetia and Lovaza in 10/2013 and saw significant improvement in LDL and LDL-P but was still above goal so this was increased to Crestor 10mg  two days of the week in August.  He denies any muscle aches or joint stiffness.  Patient tells me that he has a h/o spinal stenosis which he is now seeing a neurologist in [place] for, and feels this may have contributed to his leg aches in the past, and may not all have been from statins.  He is not certain about this association however, but tolerating Crestor twice weekly well.  He has a cath in 2010 which was negative for CAD.    RF:  HTN, age, low HDL, obesity, LDL-P number of 3,000 - LDL goal at least < 100, and LDL-P number goal preferably < 1300 nmol/L Meds:  Zetia 10 mg qd, Crestor 10 mg twice weekly, fish oil 4 g/d. Intolerant:  Lipitor 20 mg qd, Crestor tiw, Lopid, NIacin, Welchol, red yeast rice  Exercise:  He will walk on the treadmill for ~15 minutes daily but limited to ankle pain and swelling due to a fracture 8 years ago Social history:  Former smoker.  Drinks alcohol occasionally. Diet:  Eats reasonable low fat diet already, and states that he has tried to do a better job at limiting sugars and fats.  His wife is a retired Marine scientist and helps him keep track of this.   Labs:   05/2014: LDL-P 962, LDL 56, TG 199, HDL 20, TC 116, LFTs normal (Crestor 10mg  2x/week, Zetia 10mg , Fish oil 4g/d) 01/2014:  LDL-P number 1439, LDL 68, TG 214, HDL 24, LFTs normal (Crestor 10 mg once weekly, Zetia 10 mg qd, Lovaza 4 g/d) 10/2013:  LDL-P number 3115, LDL 145, TG 210, HDL 27, TC 214, LFTs normal, Glucose 98, Scr 1.5 (Lovaza 4 g/d only).  Zetia was started soon after these labs were drawn.  Current Outpatient Prescriptions  Medication Sig Dispense Refill  .  carvedilol (COREG) 3.125 MG tablet Take 3.125 mg by mouth 2 (two) times daily with a meal.    . gabapentin (NEURONTIN) 300 MG capsule Take 600 mg by mouth at bedtime.    Marland Kitchen lisinopril (PRINIVIL,ZESTRIL) 10 MG tablet Take 5 mg by mouth daily.    . Magnesium 100 MG TABS Take 200 mg by mouth at bedtime.    Marland Kitchen rOPINIRole (REQUIP) 1 MG tablet Take 2 mg by mouth at bedtime.    . ACETAMINOPHEN PO Take 200 mg by mouth every 6 (six) hours as needed.    . ALPRAZolam (XANAX) 0.25 MG tablet Take 0.25 mg by mouth at bedtime as needed for anxiety.    . Ascorbic Acid (VITAMIN C PO) Take 1 capsule by mouth daily.     Marland Kitchen aspirin 81 MG tablet Take 81 mg by mouth daily.    Marland Kitchen COENZYME Q-10 PO Take 1 capsule by mouth daily.     Marland Kitchen ezetimibe (ZETIA) 10 MG tablet Take 1 tablet (10 mg total) by mouth daily. 30 tablet 6  . HYDROcodone-acetaminophen (NORCO/VICODIN) 5-325 MG per tablet Take 1 tablet by mouth at bedtime.     . MULTIPLE VITAMIN PO Take 1 capsule by mouth daily.     . nitroGLYCERIN (NITROSTAT) 0.4 MG SL tablet Place 0.4 mg under  the tongue every 5 (five) minutes as needed for chest pain.    Marland Kitchen omega-3 acid ethyl esters (LOVAZA) 1 G capsule Take 2 capsules (2 g total) by mouth 2 (two) times daily. 120 capsule 11  . omeprazole (PRILOSEC OTC) 20 MG tablet Take 20 mg by mouth daily.    . ondansetron (ZOFRAN) 4 MG tablet Take 4 mg by mouth every 8 (eight) hours as needed for nausea or vomiting.    . rosuvastatin (CRESTOR) 10 MG tablet Take 1 tablet twice weekly 90 tablet 1   No current facility-administered medications for this visit.   Allergies  Allergen Reactions  . Atorvastatin   . Compazine [Prochlorperazine Edisylate]   . Gemfibrozil   . Monascus Purpureus Went Yeast   . Penicillins   . Rosuvastatin     Failed Crestor tiw due to leg aches (tolerated Crestor qweek and biw)  . Welchol [Colesevelam Hcl]     Leg aches   Family History  Problem Relation Age of Onset  . Prostate cancer Father   . Heart  attack Brother   . Kidney cancer Brother   . Brain cancer Brother     .

## 2014-05-12 NOTE — Patient Instructions (Signed)
Continue your current medications.  Your cholesterol looks great.   Recheck labs in 6 months.

## 2014-05-12 NOTE — Assessment & Plan Note (Signed)
Pt's LDL-P greatly improved since starting therapy and increasing Crestor to twice a week.  He is tolerating therapy with no issues.  Will continue with current plan and recheck in 6 months.  If labs are stable, will have him follow up with PCP and Dr. Martinique for management afterwards.

## 2014-05-17 ENCOUNTER — Ambulatory Visit (INDEPENDENT_AMBULATORY_CARE_PROVIDER_SITE_OTHER): Payer: Medicare Other | Admitting: Neurology

## 2014-05-17 ENCOUNTER — Encounter: Payer: Self-pay | Admitting: Neurology

## 2014-05-17 VITALS — BP 130/81 | HR 69 | Ht 70.0 in | Wt 248.0 lb

## 2014-05-17 DIAGNOSIS — I10 Essential (primary) hypertension: Secondary | ICD-10-CM

## 2014-05-17 MED ORDER — ROPINIROLE HCL 1 MG PO TABS: 4.0000 mg | ORAL_TABLET | Freq: Every day | ORAL | Status: DC

## 2014-05-17 NOTE — Progress Notes (Signed)
PATIENT: [name] DOB: [birth year]  HISTORICAL  [name] is a 73 yo RH male, accompanied by his wife, referred by Dr. Jacelyn Grip, and his primary care physician Dr. Warner Mccreedy nail for evaluation of chronic low back pain, bilateral lower extremity muscle ache  He had a past medical history of hypertension, hyperlipidemia, presenting with chronic low back pain bilateral lower extremity muscle achy pain since 2010. Next  Initially it was thought due to his statin treatment, he has tried different statin without improving his symptoms,over the years, he also received multiple bilateral facet joint fluoroscopy guided injection without improving his symptoms. Next  In 2012, he was diagnosed with L4 and 5 lumbar stenosis, had lumbar laminectomy L3-4, L4-L5 by Dr. [name] in July 2012,without improving his symptoms,  He was seen by different specialists, tried different medications, this including Elavil, Xanax as needed, he is currently taking hydrocodone 5/325 mg 1-2 tablets every night, Xanax 0.25 milligram as needed, Flexeril 10 mg every night, gabapentin 600 mg every night without significant improvement,  During the daytime, he denies significant low back pain, or bilateral lower extremity pain, he does has history of right ankle fracture, mild gait difficulty due to right ankle pain, but starting at evening time, he began to noticed deep achy pain starting from midline low back, radiating along bilateral lateral leg, constant, he tends to pace around, has difficulty sleeping at nighttime, sometimes he has to got up take a hot bath couple times every night, put ice on his back, on his feet, only provide temporary relief.  He has also tried acupuncture, massage, TENS unit, without significant improvement, previously, he has a short trial of low-dose Requip, without improving his symptoms, no significant side effect noticed either.  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 without significant central canal stenosis, mild foraminal narrowing bilaterally at L3 and 4, progression of mild lateral recess narrowing bilaterally at L4-5, foraminal narrowing is stable at this level  MRI of the brain in June 2014 showed mild atrophy, no acute intracranial abnormality  UPDATE Dec 15th 2015:  He is tolerating gabapentin 300 mg 2 tablets every night, Requip 1 mg 2 tablets every night, he can sleep better with the medications, but continue complains of bilateral lower extremity doll achy pain, numbness, urgency to move, especially at nighttime, He also complains of slow progressive gait difficulty, grasps things for support, he has urinary urgency, but no incontinence.  REVIEW OF SYSTEMS: Full 14 system review of systems performed and notable only for hearing loss, ringing ears, joint pain, achy muscles, insomnia, restless legs ALLERGIES: Allergies  Allergen Reactions  . Atorvastatin   . Compazine [Prochlorperazine Edisylate]   . Gemfibrozil   . Monascus Purpureus Went Yeast   . Penicillins   . Rosuvastatin     Failed Crestor tiw due to leg aches (tolerated Crestor qweek and biw)  . Welchol [Colesevelam Hcl]     Leg aches    HOME MEDICATIONS: Current Outpatient Prescriptions on File Prior to Visit  Medication Sig Dispense Refill  . ACETAMINOPHEN PO Take 200 mg by mouth every 6 (six) hours  as needed.    . ALPRAZolam (XANAX) 0.25 MG tablet Take 0.25 mg by mouth at bedtime as needed for anxiety.    . Ascorbic Acid (VITAMIN C PO) Take 1 capsule by mouth daily.     Marland Kitchen aspirin 81 MG tablet  Take 81 mg by mouth daily.    . carvedilol (COREG) 3.125 MG tablet Take 3.125 mg by mouth 2 (two) times daily with a meal.    . COENZYME Q-10 PO Take 1 capsule by mouth daily.     Marland Kitchen ezetimibe (ZETIA) 10 MG tablet Take 1 tablet (10 mg total) by mouth daily. 30 tablet 6  . gabapentin (NEURONTIN) 300 MG capsule Take 600 mg by mouth at bedtime.    Marland Kitchen HYDROcodone-acetaminophen (NORCO/VICODIN) 5-325 MG per tablet Take 1 tablet by mouth at bedtime.     Marland Kitchen lisinopril (PRINIVIL,ZESTRIL) 10 MG tablet Take 5 mg by mouth daily.    . Magnesium 100 MG TABS Take 200 mg by mouth at bedtime.    . MULTIPLE VITAMIN PO Take 1 capsule by mouth daily.     . nitroGLYCERIN (NITROSTAT) 0.4 MG SL tablet Place 0.4 mg under the tongue every 5 (five) minutes as needed for chest pain.    Marland Kitchen omega-3 acid ethyl esters (LOVAZA) 1 G capsule Take 2 capsules (2 g total) by mouth 2 (two) times daily. 120 capsule 11  . omeprazole (PRILOSEC OTC) 20 MG tablet Take 20 mg by mouth daily.    . ondansetron (ZOFRAN) 4 MG tablet Take 4 mg by mouth every 8 (eight) hours as needed for nausea or vomiting.    Marland Kitchen rOPINIRole (REQUIP) 1 MG tablet Take 2 mg by mouth at bedtime.    . rosuvastatin (CRESTOR) 10 MG tablet Take 1 tablet twice weekly 90 tablet 1   No current facility-administered medications on file prior to visit.    PAST MEDICAL HISTORY: Past Medical History  Diagnosis Date  . Chest pain     Myocardial infarction ruled out  . Hyperlipidemia   . Ulcerative proctitis   . Hypertensive cardiovascular disease   . Paroxysmal atrial flutter     History of Paroxysmal atrial flutter   . Scarlet fever 1945  . Atrial fibrillation 1962  . Esophageal reflux   . HTN (hypertension)   . Leg pain, bilateral     PAST SURGICAL HISTORY: Past Surgical History  Procedure Laterality Date  . Cardiac catheterization  03/03/2008    Left heart cardiac catheterization and coronary  . Knee arthroscopy  1991, 1995  . Ankle reconstruction Right 2007     [place]  . Lumbar laminectomy  12/2010  . Dex insection Left 12/2012    ear  . Cardiac catheterization  1997    Dr Wynonia Lawman    FAMILY HISTORY: Family History  Problem Relation Age of Onset  . Prostate cancer Father   . Heart attack Brother   . Kidney cancer Brother   . Brain cancer Brother     SOCIAL HISTORY:  History   Social History  . Marital Status: Married    Spouse Name: Bethena Roys    Number of Children: 3  . Years of Education: college   Occupational History    Retired Network engineer job   Social History Main Topics  . Smoking status: Former Research scientist (life sciences)  . Smokeless tobacco: Never Used     Comment: quit 1980  . Alcohol Use: Yes     Comment: 1-3 weekly  . Drug Use: No  . Sexual Activity:  Not on file   Other Topics Concern  . Not on file   Social History Narrative   Patient is retired and lives at home with his wife Bethena Roys.    Education college.   Caffeine one cup daily.     PHYSICAL EXAM   Filed Vitals:   05/17/14 1245  BP: 130/81  Pulse: 69  Height: 5\' 10"  (1.778 m)  Weight: 248 lb (112.492 kg)    Not recorded      Body mass index is 35.58 kg/(m^2).   Generalized: In no acute distress  Neck: Supple, no carotid bruits   Cardiac: Regular rate rhythm  Pulmonary: Clear to auscultation bilaterally  Musculoskeletal: No deformity  Neurological examination  Mentation: Alert oriented to time, place, history taking, and causual conversation  Cranial nerve II-XII: Pupils were equal round reactive to light. Extraocular movements were full.  Visual field were full on confrontational test. Bilateral fundi were sharp.  Facial sensation and strength were normal. Hearing was intact to finger rubbing bilaterally. Uvula tongue midline.  Head turning and shoulder shrug and were normal and symmetric.Tongue protrusion into cheek strength was normal.  Motor: Normal tone, bulk and strength.  Sensory: Intact to fine touch, pinprick, mildly decreased vibratory sensation at  toes,, and preserved proprioception at toes.  Coordination: Normal finger to nose, heel-to-shin bilaterally there was no truncal ataxia  Gait: Rising up from seated position without assistance, Wide-based, cautious gait, could not perform tiptoe, heel, and tandem walking  Romberg signs: Negative  Deep tendon reflexes: Brachioradialis 1/1, biceps1/1, triceps 1/1, patellar 2/2, Achilles trace, plantar responses were flexor bilaterally.   DIAGNOSTIC DATA (LABS, IMAGING, TESTING) - I reviewed patient records, labs, notes, testing and imaging myself where available.  Lab Results  Component Value Date   WBC 7.6 11/26/2010   HGB 13.7 11/26/2010   HCT 39.5 11/26/2010   MCV 93.4 11/26/2010   PLT 170 11/26/2010      Component Value Date/Time   NA 140 10/08/2013 0739   K 4.8 10/08/2013 0739   CL 106 10/08/2013 0739   CO2 28 10/08/2013 0739   GLUCOSE 98 10/08/2013 0739   BUN 27* 10/08/2013 0739   CREATININE 1.5 10/08/2013 0739   CALCIUM 9.0 10/08/2013 0739   PROT 6.5 05/09/2014 0853   ALBUMIN 4.0 05/09/2014 0853   AST 21 05/09/2014 0853   ALT 15 05/09/2014 0853   ALKPHOS 54 05/09/2014 0853   BILITOT 0.6 05/09/2014 0853   GFRNONAA >60 11/26/2010 0817   GFRAA >60 11/26/2010 0817   Lab Results  Component Value Date   CHOL  03/03/2008    181        ATP III CLASSIFICATION:  <200     mg/dL   Desirable  200-239  mg/dL   Borderline High  >=240    mg/dL   High   HDL 32* 03/03/2008   LDLCALC 56 05/09/2014   TRIG 199* 05/09/2014   CHOLHDL 5.7 03/03/2008   ASSESSMENT AND PLAN  [name] is a 73 y.o. male complains of chronic low back pain, bilateral lower extremity achy pain, walking around improved symptoms, suggestive of restless leg syndromes, he had a history of L 4 5 stenosis, status post decompression, most recent repeat MRI in June 2015 showed no significant canal or foraminal stenosis, failed to improve with multiple lumbar facet joint injection, previous electrodiagnostic  study showed no evidence of peripheral neuropathy or lumbar radiculopathy, he has tried and failed different medications, this includes Xanax, Flexeril, Neurontin,  hydrocodone, Elavil,  1, will try titrating dose of Requip, 1 mgup to 4 mg each night, keep gabapentin 300 mg 2 tablets every night  2. 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 Ph.D.  [facility] [address], [place], [place] [address] [phone]

## 2014-06-13 ENCOUNTER — Encounter: Payer: Self-pay | Admitting: Neurology

## 2014-06-14 ENCOUNTER — Encounter: Payer: Self-pay | Admitting: Neurology

## 2014-06-14 NOTE — Telephone Encounter (Signed)
Dana: Please check on his MRI cervical spine, make sure it is on schedule  Please also move up his appointment after MRI cervical spine

## 2014-07-01 ENCOUNTER — Ambulatory Visit (INDEPENDENT_AMBULATORY_CARE_PROVIDER_SITE_OTHER): Payer: Medicare Other | Admitting: Neurology

## 2014-07-01 ENCOUNTER — Encounter: Payer: Self-pay | Admitting: Neurology

## 2014-07-01 VITALS — BP 132/79 | HR 73 | Ht 69.0 in | Wt 231.0 lb

## 2014-07-01 DIAGNOSIS — M79604 Pain in right leg: Secondary | ICD-10-CM

## 2014-07-01 DIAGNOSIS — I10 Essential (primary) hypertension: Secondary | ICD-10-CM

## 2014-07-01 DIAGNOSIS — G2581 Restless legs syndrome: Secondary | ICD-10-CM

## 2014-07-01 DIAGNOSIS — M79605 Pain in left leg: Secondary | ICD-10-CM

## 2014-07-01 NOTE — Progress Notes (Signed)
PATIENT: [name] DOB: Jun 01, 1941  HISTORICAL  [name] is a 74 yo RH male, accompanied by his wife, referred by Dr. Jacelyn Hawkins, and his primary care physician Dr. Leonides Hawkins for evaluation of chronic low back pain, bilateral lower extremity muscle ache  He had a past medical history of hypertension, hyperlipidemia, presenting with chronic low back pain, bilateral lower extremity muscle achy pain since 2010.   Initially it was thought due to his statin treatment, he has tried different statin without improving his symptoms,over the years, he also received multiple lumbar bilateral facet joint fluoroscopy guided injection without improving his symptoms.   In 2012, he was diagnosed with L4 and 5 lumbar stenosis, had lumbar laminectomy L3-4, L4-L5 by Dr. Joya Hawkins in July 2012,without improving his symptoms,  He was seen by different specialists, tried different medications, this including Elavil, Xanax as needed, he is currently taking hydrocodone 5/325 mg 1-2 tablets every night, Xanax 0.25 milligram as needed, Flexeril 10 mg every night, gabapentin 600 mg every night without significant improvement,  During the daytime, he denies significant low back pain, or bilateral lower extremity pain, he does has history of right ankle fracture, mild gait difficulty due to right ankle pain, but starting at evening time, he began to noticed deep achy pain starting from midline low back, radiating along bilateral lateral leg, constant, he tends to pace around, has difficulty sleeping at nighttime, sometimes he has to got up take a hot bath couple times every night, put ice on his back, on his feet, only provide temporary relief.  He has also tried acupuncture, massage, TENS unit, without significant improvement, previously, he has a short trial of low-dose Requip, without improving his symptoms, no significant side effect noticed either.  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 space without significant central canal stenosis, mild foraminal narrowing bilaterally at L3 and 4, progression of mild lateral recess narrowing bilaterally at L4-5, foraminal narrowing is stable at this level  MRI of the brain in June 2014 showed mild atrophy, no acute intracranial abnormality  UPDATE Dec 15th 2015:  He is tolerating gabapentin 300 mg 2 tablets every night, Requip 1 mg 2 tablets every night, he can sleep better with the medications, but continue complains of bilateral lower extremity dull achy pain, numbness, urgency to move, especially at nighttime, He also complains of slow progressive gait difficulty, grasps things for support, he has urinary urgency, but no incontinence.  UPDATE Jan 29th 2016: He is now taking gabapentin 300mg  32 tabs qhs and requip 1mg , 3 tabs qhs, which has been very helpful,  30%, improvement, he still has bilatarel leg pain at evening, stretching,massage,  He denies bowel and bladder incontinence , he has tinlging at the soles of his feet.   He started water aerobic,  He is taking less hydrocodone, 1/2 tab qhs,    REVIEW OF SYSTEMS: Full 14 system review of systems performed and notable only for  restless leg, insomnia, achy muscles, walking difficulties, hearing loss, ringing ears ALLERGIES: Allergies  Allergen Reactions  . Atorvastatin   . Compazine [Prochlorperazine Edisylate]   .  Gemfibrozil   . Monascus Purpureus Went Yeast   . Penicillins   . Rosuvastatin     Failed Crestor tiw due to leg aches (tolerated Crestor qweek and biw)  . Welchol  [Colesevelam Hcl]     Leg aches    HOME MEDICATIONS: Current Outpatient Prescriptions on File Prior to Visit  Medication Sig Dispense Refill  . ACETAMINOPHEN PO Take 200 mg by mouth every 6 (six) hours as needed.    . ALPRAZolam (XANAX) 0.25 MG tablet Take 0.25 mg by mouth at bedtime as needed for anxiety.    . Ascorbic Acid (VITAMIN C PO) Take 1 capsule by mouth daily.     Marland Kitchen aspirin 81 MG tablet Take 81 mg by mouth daily.    . carvedilol (COREG) 3.125 MG tablet Take 3.125 mg by mouth 2 (two) times daily with a meal.    . COENZYME Q-10 PO Take 1 capsule by mouth daily.     Marland Kitchen ezetimibe (ZETIA) 10 MG tablet Take 1 tablet (10 mg total) by mouth daily. 30 tablet 6  . gabapentin (NEURONTIN) 300 MG capsule Take 600 mg by mouth at bedtime.    Marland Kitchen HYDROcodone-acetaminophen (NORCO/VICODIN) 5-325 MG per tablet Take 1 tablet by mouth at bedtime.     Marland Kitchen lisinopril (PRINIVIL,ZESTRIL) 10 MG tablet Take 5 mg by mouth daily.    . Magnesium 100 MG TABS Take 200 mg by mouth at bedtime.    . MULTIPLE VITAMIN PO Take 1 capsule by mouth daily.     . nitroGLYCERIN (NITROSTAT) 0.4 MG SL tablet Place 0.4 mg under the tongue every 5 (five) minutes as needed for chest pain.    Marland Kitchen omega-3 acid ethyl esters (LOVAZA) 1 G capsule Take 2 capsules (2 g total) by mouth 2 (two) times daily. 120 capsule 11  . omeprazole (PRILOSEC OTC) 20 MG tablet Take 20 mg by mouth daily.    . ondansetron (ZOFRAN) 4 MG tablet Take 4 mg by mouth every 8 (eight) hours as needed for nausea or vomiting.    Marland Kitchen rOPINIRole (REQUIP) 1 MG tablet Take 4 tablets (4 mg total) by mouth at bedtime. 120 tablet 11  . rosuvastatin (CRESTOR) 10 MG tablet Take 1 tablet twice weekly 90 tablet 1   No current facility-administered medications on file prior to visit.    PAST MEDICAL HISTORY: Past Medical History  Diagnosis Date  . Chest pain     Myocardial infarction ruled out  . Hyperlipidemia   . Ulcerative proctitis   . Hypertensive cardiovascular  disease   . Paroxysmal atrial flutter     History of Paroxysmal atrial flutter   . Scarlet fever 1945  . Atrial fibrillation 1962  . Esophageal reflux   . HTN (hypertension)   . Leg pain, bilateral     PAST SURGICAL HISTORY: Past Surgical History  Procedure Laterality Date  . Cardiac catheterization  03/03/2008    Left heart cardiac catheterization and coronary  . Knee arthroscopy  1991, 1995  . Ankle reconstruction Right 2007    [place]  . Lumbar laminectomy  12/2010  . Dex insection Left 12/2012    ear  . Cardiac catheterization  1997    Dr Wynonia Lawman    FAMILY HISTORY: Family History  Problem Relation Age of Onset  . Prostate cancer Father   . Heart attack Brother   . Kidney cancer Brother   . Brain cancer Brother     SOCIAL HISTORY:  History   Social History  .  Marital Status: Married    Spouse Name: Bethena Roys    Number of Children: 3  . Years of Education: college   Occupational History    Retired Network engineer job   Social History Main Topics  . Smoking status: Former Research scientist (life sciences)  . Smokeless tobacco: Never Used     Comment: quit 1980  . Alcohol Use: Yes     Comment: 1-3 weekly  . Drug Use: No  . Sexual Activity: Not on file   Other Topics Concern  . Not on file   Social History Narrative   Patient is retired and lives at home with his wife Bethena Roys.    Education college.   Caffeine one cup daily.     PHYSICAL EXAM   Filed Vitals:   07/01/14 0825  BP: 132/79  Pulse: 73  Height: 5\' 9"  (1.753 m)  Weight: 231 lb (104.781 kg)    Not recorded      Body mass index is 34.1 kg/(m^2).   Generalized: In no acute distress  Neck: Supple, no carotid bruits   Cardiac: Regular rate rhythm  Pulmonary: Clear to auscultation bilaterally  Musculoskeletal: No deformity  Neurological examination  Mentation: Alert oriented to time, place, history taking, and causual conversation  Cranial nerve II-XII: Pupils were equal round reactive to light. Extraocular movements  were full.  Visual field were full on confrontational test. Bilateral fundi were sharp.  Facial sensation and strength were normal. Hearing was intact to finger rubbing bilaterally. Uvula tongue midline.  Head turning and shoulder shrug and were normal and symmetric.Tongue protrusion into cheek strength was normal.  Motor: Normal tone, bulk and strength, with exception of mild toe extension/flexion weakness.  Sensory: Intact to fine touch, pinprick, mildly decreased vibratory sensation at toes,, and preserved proprioception at toes.  Coordination: Normal finger to nose, heel-to-shin bilaterally there was no truncal ataxia  Gait: Rising up from seated position without assistance, Wide-based, cautious gait, could not perform tiptoe, heel, and tandem walking  Romberg signs: Negative  Deep tendon reflexes: Brachioradialis 1/1, biceps1/1, triceps 1/1, patellar 2/2, Achilles trace, plantar responses were flexor bilaterally.   DIAGNOSTIC DATA (LABS, IMAGING, TESTING) - I reviewed patient records, labs, notes, testing and imaging myself where available.  Lab Results  Component Value Date   WBC 7.6 11/26/2010   HGB 13.7 11/26/2010   HCT 39.5 11/26/2010   MCV 93.4 11/26/2010   PLT 170 11/26/2010      Component Value Date/Time   NA 140 10/08/2013 0739   K 4.8 10/08/2013 0739   CL 106 10/08/2013 0739   CO2 28 10/08/2013 0739   GLUCOSE 98 10/08/2013 0739   BUN 27* 10/08/2013 0739   CREATININE 1.5 10/08/2013 0739   CALCIUM 9.0 10/08/2013 0739   PROT 6.5 05/09/2014 0853   ALBUMIN 4.0 05/09/2014 0853   AST 21 05/09/2014 0853   ALT 15 05/09/2014 0853   ALKPHOS 54 05/09/2014 0853   BILITOT 0.6 05/09/2014 0853   GFRNONAA >60 11/26/2010 0817   GFRAA >60 11/26/2010 0817   Lab Results  Component Value Date   CHOL  03/03/2008    181        ATP III CLASSIFICATION:  <200     mg/dL   Desirable  200-239  mg/dL   Borderline High  >=240    mg/dL   High   HDL 32* 03/03/2008   LDLCALC 56  05/09/2014   TRIG 199* 05/09/2014   CHOLHDL 5.7 03/03/2008   ASSESSMENT AND PLAN  Fara Olden A  Handy is a 74 y.o. male complains of chronic low back pain, bilateral lower extremity achy pain, walking around improved symptoms, suggestive of restless leg syndromes, he had a history of L 4 5 stenosis, status post decompression, most recent repeat MRI in June 2015 showed no significant canal or foraminal stenosis, failed to improve with multiple lumbar facet joint injection, previous electrodiagnostic study showed no evidence of peripheral neuropathy or lumbar radiculopathy, he has tried and failed different medications, this includes Xanax, Flexeril, Neurontin, hydrocodone, Elavil,  1, keep current dose of Requip, 1 milligram, 3 tablet each night, keep gabapentin 300 mg 3 tablets every night  2. Rule out cervical spondylitic myelopathy, MRI of cervical spine, 3, return to clinic in 3 month,  No orders of the defined types were placed in this encounter.    Return in about 3 months (around 09/30/2014).  Marcial Pacas, M.D. Ph.D.  [facility] [address] [place], [place] [address] [phone]

## 2014-07-16 ENCOUNTER — Ambulatory Visit
Admission: RE | Admit: 2014-07-16 | Discharge: 2014-07-16 | Disposition: A | Discharge status: Home / Self Care | Payer: Medicare Other | Source: Ambulatory Visit | Attending: Neurology | Admitting: Neurology

## 2014-07-16 DIAGNOSIS — I10 Essential (primary) hypertension: Secondary | ICD-10-CM

## 2014-07-18 ENCOUNTER — Ambulatory Visit (INDEPENDENT_AMBULATORY_CARE_PROVIDER_SITE_OTHER): Payer: Medicare Other | Admitting: Cardiology

## 2014-07-18 ENCOUNTER — Encounter: Payer: Self-pay | Admitting: Cardiology

## 2014-07-18 VITALS — BP 130/60 | HR 68 | Ht 70.0 in | Wt 250.6 lb

## 2014-07-18 DIAGNOSIS — I10 Essential (primary) hypertension: Secondary | ICD-10-CM

## 2014-07-18 DIAGNOSIS — N183 Chronic kidney disease, stage 3 unspecified: Secondary | ICD-10-CM

## 2014-07-18 DIAGNOSIS — E782 Mixed hyperlipidemia: Secondary | ICD-10-CM

## 2014-07-18 MED ORDER — LISINOPRIL 2.5 MG PO TABS: 2.5000 mg | ORAL_TABLET | Freq: Every day | ORAL | Status: DC

## 2014-07-18 NOTE — Patient Instructions (Addendum)
Continue your current therapy  I will see you in 6 months.   

## 2014-07-18 NOTE — Progress Notes (Signed)
[name] Date of Birth: Jun 09, 1940 Medical Record #[id number]  History of Present Illness: [name] is seen for follow up of hyperlipidemia. He has a history of mixed hyperlipidemia, HTN, and obesity.  He has been intolerant to statins due to severe myalgias. This includes lipitor at a dose of 20 mg daily and Crestor 10 mg 3 days a week. Welchol apparently made no change in his lipid levels. Niacin was associated with severe flushing.   He has no history of vascular disease. He had normal cardiac caths in 1998 and 2009. No history of PAD or CVA/TIA.  He apparently had afib in 1962 but no documented recurrence.  Since his last visit with me he has been followed in the lipid clinic. He is now on crestor 2 days a week and Zetia. He had a dramatic drop in his cholesterol. He does complain of the cost of Zetia but likes the results. He is exercising 2 days a week with water aerobics. He is now on gabapentin for restless leg syndrome. Overall he feels well. He did develop lightheadedness on lisinopril 5 mg but with reduction in dose to 2.5 mg the lightheadedness resolved.  Current Outpatient Prescriptions on File Prior to Visit  Medication Sig Dispense Refill  . ACETAMINOPHEN PO Take 200 mg by mouth every 6 (six) hours as needed.    . ALPRAZolam (XANAX) 0.25 MG tablet Take 0.25 mg by mouth at bedtime as needed for anxiety.    . Ascorbic Acid (VITAMIN C PO) Take 1 capsule by mouth daily.     Marland Kitchen aspirin 81 MG tablet Take 81 mg by mouth daily.    . carvedilol (COREG) 3.125 MG tablet Take 3.125 mg by mouth 2 (two) times daily with a meal.    . COENZYME Q-10 PO Take 1 capsule by mouth daily.     Marland Kitchen ezetimibe (ZETIA) 10 MG tablet Take 1 tablet (10 mg total) by mouth daily. 30 tablet 6  . gabapentin (NEURONTIN) 300 MG capsule Take 900 mg by mouth at bedtime.     Marland Kitchen HYDROcodone-acetaminophen (NORCO/VICODIN) 5-325 MG per tablet Take 1 tablet by mouth at bedtime.     . Magnesium 100 MG TABS Take 200 mg by  mouth at bedtime.    . MULTIPLE VITAMIN PO Take 1 capsule by mouth daily.     . nitroGLYCERIN (NITROSTAT) 0.4 MG SL tablet Place 0.4 mg under the tongue every 5 (five) minutes as needed for chest pain.    Marland Kitchen omeprazole (PRILOSEC OTC) 20 MG tablet Take 20 mg by mouth daily.    . ondansetron (ZOFRAN) 4 MG tablet Take 4 mg by mouth every 8 (eight) hours as needed for nausea or vomiting.    Marland Kitchen rOPINIRole (REQUIP) 1 MG tablet Take 4 tablets (4 mg total) by mouth at bedtime. (Patient taking differently: Take 3 mg by mouth at bedtime. ) 120 tablet 11  . rosuvastatin (CRESTOR) 10 MG tablet Take 1 tablet twice weekly 90 tablet 1   No current facility-administered medications on file prior to visit.    Allergies  Allergen Reactions  . Atorvastatin   . Compazine [Prochlorperazine Edisylate]   . Gemfibrozil   . Monascus Purpureus Went Yeast   . Penicillins   . Rosuvastatin     Failed Crestor tiw due to leg aches (tolerated Crestor qweek and biw)  . Welchol [Colesevelam Hcl]     Leg aches    Past Medical History  Diagnosis Date  . Chest pain  Myocardial infarction ruled out  . Hyperlipidemia   . Ulcerative proctitis   . Hypertensive cardiovascular disease   . Paroxysmal atrial flutter     History of Paroxysmal atrial flutter   . Scarlet fever 1945  . Atrial fibrillation 1962  . Esophageal reflux   . HTN (hypertension)   . Leg pain, bilateral     Past Surgical History  Procedure Laterality Date  . Cardiac catheterization  03/03/2008    Left heart cardiac catheterization and coronary  . Knee arthroscopy  1991, 1995  . Ankle reconstruction Right 2007    [place]  . Lumbar laminectomy  12/2010  . Dex insection Left 12/2012    ear  . Cardiac catheterization  1997    Dr Wynonia Lawman    History  Smoking status  . Former Smoker  Smokeless tobacco  . Never Used    Comment: quit 1980    History  Alcohol Use  . Yes    Comment: 1-3 weekly    Family History  Problem Relation Age of  Onset  . Prostate cancer Father   . Heart attack Brother   . Kidney cancer Brother   . Brain cancer Brother     Review of Systems: As noted in HPI.  All other systems were reviewed and are negative.  Physical Exam: BP 130/60 mmHg  Pulse 68  Ht 5\' 10"  (1.778 m)  Wt 250 lb 9.6 oz (113.671 kg)  BMI 35.96 kg/m2 He is an obese WM in NAD.  HEENT: [place]/AT, PERRL, EOMI. Sclera are clear. Oropharynx is clear.  Neck: no JVD, bruits, thyromegaly, or adenopathy. Lungs: clear CV: RRR, normal S1-2, no gallop, murmur,click Abdomen: obese, soft, NT, no masses, bruits, or HSM Ext: no cyanosis or edema. Pulses are 2+. Skin: warm and dry, no xanthalaismas or tendon xanthomas. Neuro: alert and oriented x 3. CN II-XII intact  LABORATORY DATA: Lab Results  Component Value Date   WBC 7.6 11/26/2010   HGB 13.7 11/26/2010   HCT 39.5 11/26/2010   PLT 170 11/26/2010   GLUCOSE 98 10/08/2013   CHOL  03/03/2008    181        ATP III CLASSIFICATION:  <200     mg/dL   Desirable  200-239  mg/dL   Borderline High  >=240    mg/dL   High   TRIG 199* 05/09/2014   HDL 32* 03/03/2008   LDLCALC 56 05/09/2014   ALT 15 05/09/2014   AST 21 05/09/2014   NA 140 10/08/2013   K 4.8 10/08/2013   CL 106 10/08/2013   CREATININE 1.5 10/08/2013   BUN 27* 10/08/2013   CO2 28 10/08/2013   INR 0.9 03/02/2008   Recent lipoprotein analysis reviewed in Epic. LDL particle number reduced from 3115 to 962. Small LDL particle number >2300 to 784. LDL reduced from 145 to 56. Triglycerides 199.     Assessment / Plan: 1. Mixed hyperlipidemia. Multiple drug intolerances including statins and niacin. Marked reduction with combination of Zetia and low dose Crestor twice a week. Continue current therapy. Follow up in 6 months.  2. HTN- controlled. Continue lower dose of lisinopril.   3. Normal cardiac cath 2009.

## 2014-07-19 ENCOUNTER — Telehealth: Payer: Self-pay | Admitting: Neurology

## 2014-07-19 NOTE — Telephone Encounter (Signed)
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

## 2014-07-19 NOTE — Telephone Encounter (Signed)
Aware of results - he will keep his appt in April to further discuss.

## 2014-08-05 ENCOUNTER — Encounter: Payer: Self-pay | Admitting: Neurology

## 2014-08-05 ENCOUNTER — Other Ambulatory Visit: Payer: Self-pay | Admitting: *Deleted

## 2014-08-05 MED ORDER — GABAPENTIN 300 MG PO CAPS: 300.0000 mg | ORAL_CAPSULE | Freq: Three times a day (TID) | ORAL | Status: DC

## 2014-08-11 ENCOUNTER — Encounter: Payer: Self-pay | Admitting: Neurology

## 2014-08-11 NOTE — Telephone Encounter (Signed)
[name]: We will do please take care of this.

## 2014-08-19 ENCOUNTER — Ambulatory Visit (INDEPENDENT_AMBULATORY_CARE_PROVIDER_SITE_OTHER): Payer: Medicare Other | Admitting: Neurology

## 2014-08-19 ENCOUNTER — Encounter: Payer: Self-pay | Admitting: Neurology

## 2014-08-19 VITALS — BP 132/82 | HR 64 | Ht 70.0 in | Wt 252.0 lb

## 2014-08-19 DIAGNOSIS — G2581 Restless legs syndrome: Secondary | ICD-10-CM

## 2014-08-19 MED ORDER — CLONAZEPAM 0.5 MG PO TABS: ORAL_TABLET | ORAL | Status: DC

## 2014-08-19 NOTE — Progress Notes (Signed)
PATIENT: [name] DOB: 1940/06/10  HISTORICAL  [name] is a 74 yo RH male, accompanied by his wife, referred by Dr. Jacelyn Grip, and his primary care physician Dr. Leonides Schanz for evaluation of chronic low back pain, bilateral lower extremity muscle ache  He had a past medical history of hypertension, hyperlipidemia, presenting with chronic low back pain, bilateral lower extremity muscle achy pain since 2010.   Initially it was thought due to his statin treatment, he has tried different statin without improving his symptoms,over the years, he also received multiple lumbar bilateral facet joint fluoroscopy guided injection without improving his symptoms.   In 2012, he was diagnosed with L4 and 5 lumbar stenosis, had lumbar laminectomy L3-4, L4-L5 by Dr. [name] in July 2012,without improving his symptoms,  He was seen by different specialists, tried different medications, this including Elavil, Xanax as needed, he is currently taking hydrocodone 5/325 mg 1-2 tablets every night, Xanax 0.25 milligram as needed, Flexeril 10 mg every night, gabapentin 600 mg every night without significant improvement,  During the daytime, he denies significant low back pain, or bilateral lower extremity pain, he does has history of right ankle fracture, mild gait difficulty due to right ankle pain, but starting at evening time, he began to noticed deep achy pain starting from midline low back, radiating along bilateral lateral leg, constant, he tends to pace around, has difficulty sleeping at nighttime, sometimes he has to got up take a hot bath couple times every night, put ice on his back, on his feet, only provide temporary relief.  He has also tried acupuncture, massage, TENS unit, without significant improvement, previously, he has a short trial of low-dose Requip, without improving his symptoms, no significant side effect noticed either.  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 space without significant central canal stenosis, mild foraminal narrowing bilaterally at L3 and 4, progression of mild lateral recess narrowing bilaterally at L4-5, foraminal narrowing is stable at this level  MRI of the brain in June 2014 showed mild atrophy, no acute intracranial abnormality  UPDATE Dec 15th 2015:  He is tolerating gabapentin 300 mg 2 tablets every night, Requip 1 mg 2 tablets every night, he can sleep better with the medications, but continue complains of bilateral lower extremity dull achy pain, numbness, urgency to move, especially at nighttime, He also complains of slow progressive gait difficulty, grasps things for support, he has urinary urgency, but no incontinence.  UPDATE Jan 29th 2016: He is now taking gabapentin 300mg   2 tabs qhs and requip 1mg , 3 tabs qhs, which has been very helpful,  30%, improvement, he still has bilatarel leg pain at evening, stretching,massage,  He denies bowel and bladder incontinence , he has tinlging at the soles of his feet.   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  He  continue complaining significant difficulty at nighttime, came in with a full page list of symptoms, in the morning time, he denies significant back pain, no low back pain,  symptoms started in the late afternoon, gradually building up, around 9: to10 PM, he noticed achy feeling down lateral side of both leg, increased by sitting still, intense urge to lie on the floor, stretching muscle backwards, late night hot soaking bath to help him sleep, he has to take about 3 AM last night, difficulty sleeping, excessive fatigue during the daytime, could not sit through movies, standing up in the aisle 2/3 of the movie He is now taking, hydrocodone  5/325mg  2 tabs 9pm, advil 200mg  2 tab   He has quit taking Requip 1 mg, up to 4 tablets every night, also gabapentin, complains of upset stomach, without relieving his symptoms, he is now taking Advil 200 mg 2 tablets as needed, hydrocodone/Tylenol 5/325 mg 2 tablets every night for symptoms control, which only mild, temporarily, Tylenol helps some too.  REVIEW OF SYSTEMS: Full 14 system review of systems performed and notable only for restless leg  ALLERGIES: Allergies  Allergen Reactions  . Atorvastatin   . Compazine [Prochlorperazine Edisylate]   . Gemfibrozil   . Monascus Purpureus Went Yeast   . Penicillins   . Rosuvastatin     Failed Crestor tiw due to leg aches (tolerated Crestor qweek and biw)  . Welchol [Colesevelam Hcl]     Leg aches    HOME MEDICATIONS: Current Outpatient Prescriptions on File Prior to Visit  Medication Sig Dispense Refill  . ACETAMINOPHEN PO Take 200 mg by mouth every 6 (six) hours as needed.    . ALPRAZolam (XANAX) 0.25 MG tablet Take 0.25 mg by mouth at bedtime as needed for anxiety.    . Ascorbic Acid (VITAMIN C PO) Take 1 capsule by mouth daily.     Marland Kitchen aspirin 81 MG tablet Take 81 mg by mouth daily.    . carvedilol (COREG) 3.125 MG tablet Take 3.125 mg by mouth 2 (two) times daily with a meal.    . COENZYME Q-10 PO Take 1 capsule by mouth daily.     Marland Kitchen ezetimibe (ZETIA) 10 MG tablet Take 1 tablet (10 mg total) by mouth daily. 30 tablet 6  . HYDROcodone-acetaminophen (NORCO/VICODIN) 5-325  MG per tablet Take 1 tablet by mouth at bedtime.     Marland Kitchen lisinopril (PRINIVIL,ZESTRIL) 2.5 MG tablet Take 1 tablet (2.5 mg total) by mouth daily. 90 tablet 3  . Magnesium 100 MG TABS Take 200 mg by mouth at bedtime.    . MULTIPLE VITAMIN PO Take 1 capsule by mouth daily.     . nitroGLYCERIN (NITROSTAT) 0.4 MG SL tablet Place 0.4 mg under the tongue every 5 (five) minutes as needed for chest pain.    . Omega-3 Fatty Acids (FISH OIL) 1000 MG CAPS Take 1,000 mg by mouth 4 (four) times daily.    Marland Kitchen omeprazole (PRILOSEC OTC) 20 MG tablet Take 20 mg by mouth daily.    . ondansetron (ZOFRAN) 4 MG tablet Take 4 mg by mouth every 8 (eight) hours as needed for nausea or vomiting.    . rosuvastatin (CRESTOR) 10 MG tablet Take 1 tablet twice weekly 90 tablet 1   No current facility-administered medications on file prior to visit.    PAST MEDICAL HISTORY: Past Medical History  Diagnosis Date  . Chest pain     Myocardial infarction ruled out  . Hyperlipidemia   . Ulcerative  proctitis   . Hypertensive cardiovascular disease   . Paroxysmal atrial flutter     History of Paroxysmal atrial flutter   . Scarlet fever 1945  . Atrial fibrillation 1962  . Esophageal reflux   . HTN (hypertension)   . Leg pain, bilateral     PAST SURGICAL HISTORY: Past Surgical History  Procedure Laterality Date  . Cardiac catheterization  03/03/2008    Left heart cardiac catheterization and coronary  . Knee arthroscopy  1991, 1995  . Ankle reconstruction Right 2007    [place]  . Lumbar laminectomy  12/2010  . Dex insection Left 12/2012    ear  . Cardiac catheterization  1997    Dr Wynonia Lawman    FAMILY HISTORY: Family History  Problem Relation Age of Onset  . Prostate cancer Father   . Heart attack Brother   . Kidney cancer Brother   . Brain cancer Brother     SOCIAL HISTORY:  History   Social History  . Marital Status: Married    Spouse Name: Bethena Roys    Number of Children: 3  . Years of Education: college    Occupational History    Retired Network engineer job   Social History Main Topics  . Smoking status: Former Research scientist (life sciences)  . Smokeless tobacco: Never Used     Comment: quit 1980  . Alcohol Use: Yes     Comment: 1-3 weekly  . Drug Use: No  . Sexual Activity: Not on file   Other Topics Concern  . Not on file   Social History Narrative   Patient is retired and lives at home with his wife Bethena Roys.    Education college.   Caffeine one cup daily.     PHYSICAL EXAM   Filed Vitals:   08/19/14 0954  BP: 132/82  Pulse: 64  Height: 5\' 10"  (1.778 m)  Weight: 252 lb (114.306 kg)    Not recorded      Body mass index is 36.16 kg/(m^2). PHYSICAL EXAMNIATION:  Gen: NAD, conversant, well nourised, obese, well groomed                     Cardiovascular: Regular rate rhythm, no peripheral edema, warm, nontender. Eyes: Conjunctivae clear without exudates or hemorrhage Neck: Supple, no carotid bruise. Pulmonary: Clear to auscultation bilaterally   NEUROLOGICAL EXAM:  MENTAL STATUS: Speech:    Speech is normal; fluent and spontaneous with normal comprehension.  Cognition:    The patient is oriented to person, place, and time;     recent and remote memory intact;     language fluent;     normal attention, concentration,     fund of knowledge.  CRANIAL NERVES: CN II: Visual fields are full to confrontation. Fundoscopic exam is normal with sharp discs and no vascular changes. Venous pulsations are present bilaterally. Pupils are 4 mm and briskly reactive to light. Visual acuity is 20/20 bilaterally. CN III, IV, VI: extraocular movement are normal. No ptosis. CN V: Facial sensation is intact to pinprick in all 3 divisions bilaterally. Corneal responses are intact.  CN VII: Face is symmetric with normal eye closure and smile. CN VIII: Hearing is normal to rubbing fingers CN IX, X: Palate elevates symmetrically. Phonation is normal. CN XI: Head turning and shoulder shrug are intact CN XII: Tongue is  midline with normal movements and no atrophy.  MOTOR: There is no pronator drift of out-stretched arms. Muscle bulk and tone are normal. Muscle strength is normal.  Shoulder abduction Shoulder external rotation Elbow flexion Elbow extension Wrist flexion Wrist extension Finger abduction Hip flexion Knee flexion Knee extension Ankle dorsi flexion Ankle plantar flexion  R 5 5 5 5 5 5 5 5 5 5 5 5   L 5 5 5 5 5 5 5 5 5 5 5 5     REFLEXES: Reflexes are 2+ and symmetric at the biceps, triceps, knees, and ankles. Plantar responses are flexor.  SENSORY: Light touch, pinprick, position sense, and vibration sense are intact in fingers and toes.  COORDINATION: Rapid alternating movements and fine finger movements are intact. There is no dysmetria on finger-to-nose and heel-knee-shin. There are no abnormal or extraneous movements.   GAIT/STANCE: Dragging right leg some  Romberg is absent.     DIAGNOSTIC DATA (LABS, IMAGING, TESTING) - I reviewed patient records, labs, notes, testing and imaging myself where available.  Lab Results  Component Value Date   WBC 7.6 11/26/2010   HGB 13.7 11/26/2010   HCT 39.5 11/26/2010   MCV 93.4 11/26/2010   PLT 170 11/26/2010      Component Value Date/Time   NA 140 10/08/2013 0739   K 4.8 10/08/2013 0739   CL 106 10/08/2013 0739   CO2 28 10/08/2013 0739   GLUCOSE 98 10/08/2013 0739   BUN 27* 10/08/2013 0739   CREATININE 1.5 10/08/2013 0739   CALCIUM 9.0 10/08/2013 0739   PROT 6.5 05/09/2014 0853   ALBUMIN 4.0 05/09/2014 0853   AST 21 05/09/2014 0853   ALT 15 05/09/2014 0853   ALKPHOS 54 05/09/2014 0853   BILITOT 0.6 05/09/2014 0853   GFRNONAA >60 11/26/2010 0817   GFRAA >60 11/26/2010 0817   Lab Results  Component Value Date   CHOL 116 05/09/2014   HDL 20* 05/09/2014   LDLCALC 56 05/09/2014   TRIG 199* 05/09/2014   CHOLHDL 5.7 03/03/2008   ASSESSMENT AND PLAN  KUPER RENNELS is a 74 y.o. male complains of chronic low back pain,  bilateral lower extremity achy pain, walking around improved symptoms, suggestive of restless leg syndromes, he had a history of L 4, 5 stenosis, status post decompression, most recent repeat MRI lumbar in June 2015 showed no significant canal or foraminal stenosis, failed to improve with multiple lumbar facet joint injection, previous electrodiagnostic study showed no evidence of peripheral neuropathy or lumbar radiculopathy, he has tried and failed different medications, this includes Xanax, Flexeril, Neurontin, hydrocodone, Elavil,requip, advil, Tylenol  1. Clonazepam 0.5 mg, 1-2 tablets every night  2, decreased frequent advil, hydrocodone, Tylenol use 3. RTC in one month  Marcial Pacas, M.D. Ph.D.  [facility] [address] [place], [place] [address] [phone]

## 2014-08-31 ENCOUNTER — Ambulatory Visit (INDEPENDENT_AMBULATORY_CARE_PROVIDER_SITE_OTHER): Payer: Medicare Other | Admitting: Adult Health

## 2014-08-31 ENCOUNTER — Encounter: Payer: Self-pay | Admitting: Adult Health

## 2014-08-31 VITALS — BP 149/81 | HR 74 | Ht 70.0 in | Wt 252.0 lb

## 2014-08-31 DIAGNOSIS — G2581 Restless legs syndrome: Secondary | ICD-10-CM | POA: Insufficient documentation

## 2014-08-31 MED ORDER — PREGABALIN 50 MG PO CAPS: 50.0000 mg | ORAL_CAPSULE | Freq: Every day | ORAL | Status: DC

## 2014-08-31 NOTE — Progress Notes (Addendum)
PATIENT: [name] DOB: 02/18/1941  REASON FOR VISIT: follow up- RLS HISTORY FROM: patient  HISTORY OF PRESENT ILLNESS: [name] is a 74 year old male with a history of restless leg syndrome. He returns today for follow-up. The patient was just recently seen earlier this month for his restless legs syndrome. Dr. [name] started him on Klonopin. The patient states that he cannot tolerate this medication it caused him to be confused and affected his balance. He has stopped this medication. The patient states that the pain in both his legs have increased. He was taking hydrocodone 1-2 tablets at bedtime and that helps some. He states that he has burning and tingling that travels up and down the entire leg bilaterally. He will sometimes have swelling in the right ankle which he contributes to a prior injury. He states that he will have to use ice packs or a warm bath to control his discomfort. He also states that putting his bare feet on ice does calm down the symptoms. In the past he saw Dr. Jacelyn Grip and has received spinal injections. Patient states that Dr. [name] asked him to hold off on this for now. The patient states that he has had nerve conduction studies in the past that was unremarkable. The patient returns today for an evaluation.   HISTORY 08/19/14 [name]): [name] is a 74 yo RH male, accompanied by his wife, referred by Dr. Jacelyn Grip, and his primary care physician Dr. Leonides Schanz for evaluation of chronic low back pain, bilateral lower extremity muscle ache  He had a past medical history of hypertension, hyperlipidemia, presenting with chronic low back pain, bilateral lower extremity muscle achy pain since 2010.   Initially it was thought due to his statin treatment, he has tried different statin without improving his symptoms,over the years, he also received multiple lumbar bilateral facet joint fluoroscopy guided injection without improving his symptoms.   In 2012, he was diagnosed with L4 and 5  lumbar stenosis, had lumbar laminectomy L3-4, L4-L5 by Dr. [name] in July 2012,without improving his symptoms,  He was seen by different specialists, tried different medications, this including Elavil, Xanax as needed, he is currently taking hydrocodone 5/325 mg 1-2 tablets every night, Xanax 0.25 milligram as needed, Flexeril 10 mg every night, gabapentin 600 mg every night without significant improvement,  During the daytime, he denies significant low back pain, or bilateral lower extremity pain, he does has history of right ankle fracture, mild gait difficulty due to right ankle pain, but starting at evening time, he began to noticed deep achy pain starting from midline low back, radiating along bilateral lateral leg, constant, he tends to pace around, has difficulty sleeping at nighttime, sometimes he has to got up take a hot bath couple times every night, put ice on his back, on his feet, only provide temporary relief.  He has also tried acupuncture, massage, TENS unit, without significant improvement, previously, he has a short trial of low-dose Requip, without improving his symptoms, no significant side effect noticed either.  He has bilateral lateral 3 toes numbness, no weakness, no bilateral fingertips numbness, or weakness  Per record, electrodiagnostic study failed to demonstrate significant etiology,  We have reviewed MRI lumbar in 2012, there was evidence of L4-5 spinal stenosis, and the most recent MRI Lumbar was in June 2014,progression of multilevel facet arthropathy, status post a laminectomy at L3-4, with residual large synovial cyst extending along the posterior epidural space without significant central canal stenosis, mild foraminal narrowing  bilaterally at L3 and 4, progression of mild lateral recess narrowing bilaterally at L4-5, foraminal narrowing is stable at this level  MRI of the brain in June 2014 showed mild atrophy, no acute intracranial abnormality  UPDATE Dec 15th  2015:  He is tolerating gabapentin 300 mg 2 tablets every night, Requip 1 mg 2 tablets every night, he can sleep better with the medications, but continue complains of bilateral lower extremity dull achy pain, numbness, urgency to move, especially at nighttime, He also complains of slow progressive gait difficulty, grasps things for support, he has urinary urgency, but no incontinence.  UPDATE Jan 29th 2016: He is now taking gabapentin 300mg  2 tabs qhs and requip 1mg , 3 tabs qhs, which has been very helpful, 30%, improvement, he still has bilatarel leg pain at evening, stretching,massage, He denies bowel and bladder incontinence , he has tinlging at the soles of his feet.   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  He continue complaining significant difficulty at nighttime, came in with a full page list of symptoms, in the morning time, he denies significant back pain, no low back pain, symptoms started in the late afternoon, gradually building up, around 9: to10 PM, he noticed achy feeling down lateral side of both leg, increased by sitting still, intense urge to lie on the floor, stretching muscle backwards, late night hot soaking bath to help him sleep, he has to take about 3 AM last night, difficulty sleeping, excessive fatigue during the daytime, could not sit through movies, standing up in the aisle 2/3 of the movie He is now taking, hydrocodone 5/325mg  2 tabs 9pm, advil 200mg  2 tab   He has quit taking Requip 1 mg, up to 4 tablets every night, also gabapentin, complains of upset stomach, without relieving his symptoms, he is now taking Advil 200 mg 2 tablets as needed,  hydrocodone/Tylenol 5/325 mg 2 tablets every night for symptoms control, which only mild, temporarily, Tylenol helps some too.  REVIEW OF SYSTEMS: Out of a complete 14 system review of symptoms, the patient complains only of the following symptoms, and all other reviewed systems are negative.  Restless leg, insomnia, activity change, fatigue  ALLERGIES: Allergies  Allergen Reactions  . Atorvastatin   . Compazine [Prochlorperazine Edisylate]   . Gemfibrozil   . Monascus Purpureus Went Yeast   . Penicillins   . Rosuvastatin     Failed Crestor tiw due to leg aches (tolerated Crestor qweek and biw)  . Welchol [Colesevelam Hcl]     Leg aches    HOME MEDICATIONS: Outpatient Prescriptions Prior to Visit  Medication Sig Dispense Refill  . ACETAMINOPHEN PO Take 200 mg by mouth every 6 (six) hours as needed.    . Ascorbic Acid (VITAMIN C PO) Take 500 mg by mouth daily.     Marland Kitchen aspirin 81 MG tablet Take 81 mg by mouth daily.    . carvedilol (COREG) 3.125 MG tablet Take 3.125 mg by mouth 2 (two) times daily with a meal.    . COENZYME Q-10 PO Take 1,000 mg by mouth daily.     Marland Kitchen ezetimibe (ZETIA) 10 MG tablet Take 1 tablet (10 mg total) by mouth daily. 30 tablet 6  . HYDROcodone-acetaminophen (  NORCO/VICODIN) 5-325 MG per tablet Take 1 tablet by mouth at bedtime.     Marland Kitchen lisinopril (PRINIVIL,ZESTRIL) 2.5 MG tablet Take 1 tablet (2.5 mg total) by mouth daily. 90 tablet 3  . MULTIPLE VITAMIN PO Take 1 capsule by mouth daily.     . nitroGLYCERIN (NITROSTAT) 0.4 MG SL tablet Place 0.4 mg under the tongue every 5 (five) minutes as needed for chest pain.    Marland Kitchen omeprazole (PRILOSEC OTC) 20 MG tablet Take 20 mg by mouth daily.    . ondansetron (ZOFRAN) 4 MG tablet Take 4 mg by mouth every 8 (eight) hours as needed for nausea or vomiting.    . rosuvastatin (CRESTOR) 10 MG tablet Take 1 tablet twice weekly 90 tablet 1  . Omega-3 Fatty Acids (FISH OIL) 1000 MG CAPS Take 1,000 mg by mouth 4 (four) times daily.     . clonazePAM (KLONOPIN) 0.5 MG tablet One to 2 tabs po qhs (Patient not taking: Reported on 08/31/2014) 60 tablet 3  . ALPRAZolam (XANAX) 0.25 MG tablet Take 0.25 mg by mouth at bedtime as needed for anxiety.    . Magnesium 100 MG TABS Take 200 mg by mouth at bedtime.     No facility-administered medications prior to visit.    PAST MEDICAL HISTORY: Past Medical History  Diagnosis Date  . Chest pain     Myocardial infarction ruled out  . Hyperlipidemia   . Ulcerative proctitis   . Hypertensive cardiovascular disease   . Paroxysmal atrial flutter     History of Paroxysmal atrial flutter   . Scarlet fever 1945  . Atrial fibrillation 1962  . Esophageal reflux   . HTN (hypertension)   . Leg pain, bilateral     PAST SURGICAL HISTORY: Past Surgical History  Procedure Laterality Date  . Cardiac catheterization  03/03/2008    Left heart cardiac catheterization and coronary  . Knee arthroscopy  1991, 1995  . Ankle reconstruction Right 2007    [place]  . Lumbar laminectomy  12/2010  . Dex insection Left 12/2012    ear  . Cardiac catheterization  1997    Dr Wynonia Lawman    FAMILY HISTORY: Family History  Problem Relation Age of Onset  . Prostate cancer Father   . Heart attack Brother   . Kidney cancer Brother   . Brain cancer Brother     SOCIAL HISTORY: History   Social History  . Marital Status: Married    Spouse Name: Bethena Roys  . Number of Children: 3  . Years of Education: college   Occupational History  .      Retired    Social History Main Topics  . Smoking status: Former Research scientist (life sciences)  . Smokeless tobacco: Never Used     Comment: quit 1980  . Alcohol Use: 0.0 oz/week    0 Standard drinks or equivalent per week     Comment: 1-3 weekly  . Drug Use: No  . Sexual Activity: Not on file   Other Topics Concern  . Not on file   Social History Narrative   Patient is retired and lives at home with his wife Bethena Roys.    Education college.   Caffeine one cup daily.       PHYSICAL EXAM  Filed Vitals:   08/31/14 0822  BP: 149/81  Pulse: 74  Height: 5\' 10"  (1.778 m)  Weight: 252 lb (114.306 kg)   Body mass index is 36.16 kg/(m^2).   Generalized: Well developed, in no acute distress  Neurological examination  Mentation: Alert oriented to time, place, history taking. Follows all commands speech and language fluent Cranial nerve II-XII: Pupils were equal round reactive to light. Extraocular movements were full, visual field were full on confrontational test. Facial sensation and strength were normal. Uvula tongue midline. Head turning and shoulder shrug  were normal and symmetric. Motor: The motor testing reveals 5 over 5 strength of all 4 extremities. Good symmetric motor tone is noted throughout.  Sensory: Sensory testing is intact to soft touch on all 4 extremities. No evidence of extinction is noted.  Coordination: Cerebellar testing reveals good finger-nose-finger and heel-to-shin bilaterally.  Gait and station: Gait is normal. Tandem gait is normal. Romberg is negative. No drift is seen.  Reflexes: Deep tendon reflexes are symmetric and normal bilaterally.     DIAGNOSTIC DATA (LABS, IMAGING, TESTING) - I reviewed patient records, labs, notes, testing and imaging myself where available.  Lab Results  Component Value Date   WBC 7.6 11/26/2010   HGB 13.7 11/26/2010   HCT 39.5 11/26/2010   MCV 93.4 11/26/2010   PLT 170 11/26/2010      Component Value Date/Time   NA 140 10/08/2013 0739   K 4.8 10/08/2013 0739   CL 106 10/08/2013 0739   CO2 28 10/08/2013 0739   GLUCOSE 98 10/08/2013 0739   BUN 27* 10/08/2013 0739   CREATININE 1.5 10/08/2013 0739   CALCIUM 9.0 10/08/2013 0739   PROT 6.5 05/09/2014 0853   ALBUMIN 4.0 05/09/2014 0853   AST 21 05/09/2014 0853   ALT 15 05/09/2014 0853   ALKPHOS 54 05/09/2014 0853   BILITOT 0.6 05/09/2014 0853   GFRNONAA >60 11/26/2010 0817   GFRAA >60 11/26/2010 0817   Lab Results  Component  Value Date   CHOL 116 05/09/2014   HDL 20* 05/09/2014   LDLCALC 56 05/09/2014   TRIG 199* 05/09/2014   CHOLHDL 5.7 03/03/2008      ASSESSMENT AND PLAN 74 y.o. year old male  has a past medical history of Chest pain; Hyperlipidemia; Ulcerative proctitis; Hypertensive cardiovascular disease; Paroxysmal atrial flutter; Scarlet fever (1945); Atrial fibrillation (1962); Esophageal reflux; HTN (hypertension); and Leg pain, bilateral. here with:  1. Restless leg syndrome  Patient continues to have discomfort associated with his restless legs. In the past he has tried Requip and gabapentin but could not tolerate these medications. I will start the patient on a low-dose of Lyrica 50 mg at bedtime. If he is able to tolerate this medication it may need to be increased to reach maximal benefit. I will also check blood work- Iron and ferritin level today. If his symptoms fail to improve he should let us know. Otherwise he will return in approximately 1 month with Dr. [name].     Ward Givens, MSN, NP-C 08/31/2014, 8:50 AM [facility] [address] [place], [place] [id number] [phone]  Note: This document was prepared with digital dictation and possible smart phrase technology. Any transcriptional errors that result from this process are unintentional.

## 2014-08-31 NOTE — Patient Instructions (Addendum)
Begin Lyrica 50 mg at bedtime Try not to take in conjunction with Hydrocodone unless pain in not relieved.  If your symptoms fail to improve please let us know. I will check iron levels today.   Pregabalin capsules What is this medicine? PREGABALIN (pre GAB a lin) is used to treat nerve pain from diabetes, shingles, spinal cord injury, and fibromyalgia. It is also used to control seizures in epilepsy. This medicine may be used for other purposes; ask your health care provider or pharmacist if you have questions. COMMON BRAND NAME(S): Lyrica What should I tell my health care provider before I take this medicine? They need to know if you have any of these conditions: -bleeding problems -heart disease, including heart failure -history of alcohol or drug abuse -kidney disease -suicidal thoughts, plans, or attempt; a previous suicide attempt by you or a family member -an unusual or allergic reaction to pregabalin, gabapentin, other medicines, foods, dyes, or preservatives -pregnant or trying to get pregnant or trying to conceive with your partner -breast-feeding How should I use this medicine? Take this medicine by mouth with a glass of water. Follow the directions on the prescription label. You can take this medicine with or without food. Take your doses at regular intervals. Do not take your medicine more often than directed. Do not stop taking except on your doctor's advice. A special MedGuide will be given to you by the pharmacist with each prescription and refill. Be sure to read this information carefully each time. Talk to your pediatrician regarding the use of this medicine in children. Special care may be needed. Overdosage: If you think you have taken too much of this medicine contact a poison control center or emergency room at once. NOTE: This medicine is only for you. Do not share this medicine with others. What if I miss a dose? If you miss a dose, take it as soon as you can. If  it is almost time for your next dose, take only that dose. Do not take double or extra doses. What may interact with this medicine? -alcohol -certain medicines for blood pressure like captopril, enalapril, or lisinopril -certain medicines for diabetes, like pioglitazone or rosiglitazone -certain medicines for anxiety or sleep -narcotic medicines for pain This list may not describe all possible interactions. Give your health care provider a list of all the medicines, herbs, non-prescription drugs, or dietary supplements you use. Also tell them if you smoke, drink alcohol, or use illegal drugs. Some items may interact with your medicine. What should I watch for while using this medicine? Tell your doctor or healthcare professional if your symptoms do not start to get better or if they get worse. Visit your doctor or health care professional for regular checks on your progress. Do not stop taking except on your doctor's advice. You may develop a severe reaction. Your doctor will tell you how much medicine to take. Wear a medical identification bracelet or chain if you are taking this medicine for seizures, and carry a card that describes your disease and details of your medicine and dosage times. You may get drowsy or dizzy. Do not drive, use machinery, or do anything that needs mental alertness until you know how this medicine affects you. Do not stand or sit up quickly, especially if you are an older patient. This reduces the risk of dizzy or fainting spells. Alcohol may interfere with the effect of this medicine. Avoid alcoholic drinks. If you have a heart condition, like congestive heart failure,  and notice that you are retaining water and have swelling in your hands or feet, contact your health care provider immediately. The use of this medicine may increase the chance of suicidal thoughts or actions. Pay special attention to how you are responding while on this medicine. Any worsening of mood, or  thoughts of suicide or dying should be reported to your health care professional right away. This medicine has caused reduced sperm counts in some men. This may interfere with the ability to father a child. You should talk to your doctor or health care professional if you are concerned about your fertility. Women who become pregnant while using this medicine for seizures may enroll in the [place] Pregnancy Registry by calling ([phone]. This registry collects information about the safety of antiepileptic drug use during pregnancy. What side effects may I notice from receiving this medicine? Side effects that you should report to your doctor or health care professional as soon as possible: -allergic reactions like skin rash, itching or hives, swelling of the face, lips, or tongue -breathing problems -changes in vision -chest pain -confusion -jerking or unusual movements of any part of your body -loss of memory -muscle pain, tenderness, or weakness -suicidal thoughts or other mood changes -swelling of the ankles, feet, hands -unusual bruising or bleeding Side effects that usually do not require medical attention (Report these to your doctor or health care professional if they continue or are bothersome.): -dizziness -drowsiness -dry mouth -headache -nausea -tremors -trouble sleeping -weight gain This list may not describe all possible side effects. Call your doctor for medical advice about side effects. You may report side effects to FDA at 1-800-FDA-1088. Where should I keep my medicine? Keep out of the reach of children. This medicine can be abused. Keep your medicine in a safe place to protect it from theft. Do not share this medicine with anyone. Selling or giving away this medicine is dangerous and against the law. Store at room temperature between 15 and 30 degrees C (59 and 86 degrees F). Throw away any unused medicine after the expiration date. NOTE:  This sheet is a summary. It may not cover all possible information. If you have questions about this medicine, talk to your doctor, pharmacist, or health care provider.  2015, Elsevier/[employer] Standard. (2010-11-22 20:00:36)

## 2014-08-31 NOTE — Progress Notes (Signed)
I agree with the assessment and plan as directed by NP .The patient is known to me .   Champayne Kocian, MD  

## 2014-09-01 ENCOUNTER — Telehealth: Payer: Self-pay | Admitting: Adult Health

## 2014-09-01 LAB — IRON AND TIBC
Iron Saturation: 6 % — CL (ref 15–55)
Iron: 25 ug/dL — ABNORMAL LOW (ref 38–169)
Total Iron Binding Capacity: 419 ug/dL (ref 250–450)
UIBC: 394 ug/dL — ABNORMAL HIGH (ref 111–343)

## 2014-09-01 LAB — FERRITIN: Ferritin: 9 ng/mL — ABNORMAL LOW (ref 30–400)

## 2014-09-01 MED ORDER — FERROUS SULFATE 325 (65 FE) MG PO TABS: 325.0000 mg | ORAL_TABLET | Freq: Three times a day (TID) | ORAL | Status: DC

## 2014-09-01 NOTE — Telephone Encounter (Signed)
I called the patient. I informed him that his lab results does show low iron level as well as ferritin. We will start the patient on ferrous sulfate 325 mg 3 times a day. We will repeat lab work in one to 2 months. I consulted with Dr. Brett Fairy The patient is interested in the new restless leg  research trial going on in our office.

## 2014-09-06 ENCOUNTER — Ambulatory Visit: Payer: Medicare Other | Admitting: Neurology

## 2014-09-07 NOTE — Progress Notes (Signed)
I have reviewed and agreed above plan. 

## 2014-09-19 ENCOUNTER — Ambulatory Visit: Payer: Medicare Other | Admitting: Neurology

## 2014-09-29 ENCOUNTER — Ambulatory Visit: Payer: Medicare Other | Admitting: Neurology

## 2014-09-29 ENCOUNTER — Ambulatory Visit (INDEPENDENT_AMBULATORY_CARE_PROVIDER_SITE_OTHER): Payer: Medicare Other | Admitting: Neurology

## 2014-09-29 ENCOUNTER — Encounter: Payer: Self-pay | Admitting: Neurology

## 2014-09-29 VITALS — BP 133/84 | HR 58 | Ht 70.0 in | Wt 252.0 lb

## 2014-09-29 DIAGNOSIS — I10 Essential (primary) hypertension: Secondary | ICD-10-CM

## 2014-09-29 MED ORDER — CLONAZEPAM 0.5 MG PO TABS: 0.5000 mg | ORAL_TABLET | Freq: Every day | ORAL | Status: DC

## 2014-09-29 MED ORDER — FERROUS SULFATE 325 (65 FE) MG PO TABS: 325.0000 mg | ORAL_TABLET | Freq: Three times a day (TID) | ORAL | Status: DC

## 2014-09-29 MED ORDER — PREGABALIN 50 MG PO CAPS: 150.0000 mg | ORAL_CAPSULE | Freq: Every day | ORAL | Status: DC

## 2014-09-29 NOTE — Progress Notes (Signed)
PATIENT: [name] DOB: Nov 23, 1940  REASON FOR VISIT: follow up- RLS HISTORY FROM: patient  HISTORY OF PRESENT ILLNESS: [name] is a 74 year old male with a history of restless leg syndrome. He returns today for follow-up. The patient was just recently seen earlier this month for his restless legs syndrome. Dr. [name] started him on Klonopin. The patient states that he cannot tolerate this medication it caused him to be confused and affected his balance. He has stopped this medication. The patient states that the pain in both his legs have increased. He was taking hydrocodone 1-2 tablets at bedtime and that helps some. He states that he has burning and tingling that travels up and down the entire leg bilaterally. He will sometimes have swelling in the right ankle which he contributes to a prior injury. He states that he will have to use ice packs or a warm bath to control his discomfort. He also states that putting his bare feet on ice does calm down the symptoms. In the past he saw Dr. Jacelyn Grip and has received spinal injections. Patient states that Dr. [name] asked him to hold off on this for now. The patient states that he has had nerve conduction studies in the past that was unremarkable. The patient returns today for an evaluation.   HISTORY 08/19/14 [name]): [name] is a 74 yo RH male, accompanied by his wife, referred by Dr. Jacelyn Grip, and his primary care physician Dr. Leonides Schanz for evaluation of chronic low back pain, bilateral lower extremity muscle ache  He had a past medical history of hypertension, hyperlipidemia, presenting with chronic low back pain, bilateral lower extremity muscle achy pain since 2010.   Initially it was thought due to his statin treatment, he has tried different statin without improving his symptoms,over the years, he also received multiple lumbar bilateral facet joint fluoroscopy guided injection without improving his symptoms.   In 2012, he was diagnosed with L4 and 5  lumbar stenosis, had lumbar laminectomy L3-4, L4-L5 by Dr. [name] in July 2012,without improving his symptoms,  He was seen by different specialists, tried different medications, this including Elavil, Xanax as needed, he is currently taking hydrocodone 5/325 mg 1-2 tablets every night, Xanax 0.25 milligram as needed, Flexeril 10 mg every night, gabapentin 600 mg every night without significant improvement,  During the daytime, he denies significant low back pain, or bilateral lower extremity pain, he does has history of right ankle fracture, mild gait difficulty due to right ankle pain, but starting at evening time, he began to noticed deep achy pain starting from midline low back, radiating along bilateral lateral leg, constant, he tends to pace around, has difficulty sleeping at nighttime, sometimes he has to got up take a hot bath couple times every night, put ice on his back, on his feet, only provide temporary relief.  He has also tried acupuncture, massage, TENS unit, without significant improvement, previously, he has a short trial of low-dose Requip, without improving his symptoms, no significant side effect noticed either.  He has bilateral lateral 3 toes numbness, no weakness, no bilateral fingertips numbness, or weakness  Per record, electrodiagnostic study failed to demonstrate significant etiology,  We have reviewed MRI lumbar in 2012, there was evidence of L4-5 spinal stenosis, and the most recent MRI Lumbar was in June 2014,progression of multilevel facet arthropathy, status post a laminectomy at L3-4, with residual large synovial cyst extending along the posterior epidural space without significant central canal stenosis, mild foraminal narrowing  bilaterally at L3 and 4, progression of mild lateral recess narrowing bilaterally at L4-5, foraminal narrowing is stable at this level  MRI of the brain in June 2014 showed mild atrophy, no acute intracranial abnormality  UPDATE Dec 15th  2015:  He is tolerating gabapentin 300 mg 2 tablets every night, Requip 1 mg 2 tablets every night, he can sleep better with the medications, but continue complains of bilateral lower extremity dull achy pain, numbness, urgency to move, especially at nighttime, He also complains of slow progressive gait difficulty, grasps things for support, he has urinary urgency, but no incontinence.  UPDATE Jan 29th 2016: He is now taking gabapentin 300mg  2 tabs qhs and requip 1mg , 3 tabs qhs, which has been very helpful, 30%, improvement, he still has bilatarel leg pain at evening, stretching,massage, He denies bowel and bladder incontinence , he has tinlging at the soles of his feet.   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  He continue complaining significant difficulty at nighttime, came in with a full page list of symptoms, in the morning time, he denies significant back pain, no low back pain, symptoms started in the late afternoon, gradually building up, around 9: to10 PM, he noticed achy feeling down lateral side of both leg, increased by sitting still, intense urge to lie on the floor, stretching muscle backwards, late night hot soaking bath to help him sleep, he has to take about 3 AM last night, difficulty sleeping, excessive fatigue during the daytime, could not sit through movies, standing up in the aisle 2/3 of the movie He is now taking, hydrocodone 5/325mg  2 tabs 9pm, advil 200mg  2 tab   He has quit taking Requip 1 mg, up to 4 tablets every night, also gabapentin, complains of upset stomach, without relieving his symptoms, he is now taking Advil 200 mg 2 tablets as needed,  hydrocodone/Tylenol 5/325 mg 2 tablets every night for symptoms control, which only mild, temporarily, Tylenol helps some too.  UPDATE April 29th 2016: Laboratory showed low iron, ferritin level at 9, he was put on iron supplement, his restless leg symptoms has much improved, He is now taking clonazepam 0.5 milligram every night, Lyrica 50 mg every night, he can sleep much better,  He continue, combat right ankle pain  REVIEW OF SYSTEMS: Out of a complete 14 system review of symptoms, the patient complains only of the following symptoms, and all other reviewed systems are negative.  Restless leg, insomnia, activity change, fatigue  ALLERGIES: Allergies  Allergen Reactions  . Atorvastatin   . Compazine [Prochlorperazine Edisylate]   . Gemfibrozil   . Monascus Purpureus Went Yeast   . Penicillins   . Rosuvastatin     Failed Crestor tiw due to leg aches (tolerated Crestor qweek and biw)  . Welchol [Colesevelam Hcl]     Leg aches    HOME MEDICATIONS: Outpatient Prescriptions Prior to Visit  Medication Sig Dispense Refill  . ACETAMINOPHEN PO Take 200 mg by mouth every 6 (six) hours as needed.    . Ascorbic Acid (VITAMIN C PO) Take 500 mg by mouth daily.     Marland Kitchen aspirin 81 MG tablet Take 81 mg by mouth daily.    . carvedilol (COREG) 3.125  MG tablet Take 3.125 mg by mouth 2 (two) times daily with a meal.    . clonazePAM (KLONOPIN) 0.5 MG tablet One to 2 tabs po qhs 60 tablet 3  . COENZYME Q-10 PO Take 1,000 mg by mouth daily.     Marland Kitchen ezetimibe (ZETIA) 10 MG tablet Take 1 tablet (10 mg total) by mouth daily. 30 tablet 6  . ferrous sulfate 325 (65 FE) MG tablet Take 1 tablet (325 mg total) by mouth 3 (three) times daily with meals. 90 tablet 3  . HYDROcodone-acetaminophen (NORCO/VICODIN) 5-325 MG per tablet Take 1 tablet by mouth at bedtime.     Marland Kitchen lisinopril (PRINIVIL,ZESTRIL) 2.5 MG tablet Take 1 tablet (2.5 mg total) by mouth daily. 90 tablet 3  . MULTIPLE VITAMIN PO Take 1 capsule by  mouth daily.     Marland Kitchen omeprazole (PRILOSEC OTC) 20 MG tablet Take 20 mg by mouth daily.    . ondansetron (ZOFRAN) 4 MG tablet Take 4 mg by mouth every 8 (eight) hours as needed for nausea or vomiting.    . pregabalin (LYRICA) 50 MG capsule Take 1 capsule (50 mg total) by mouth at bedtime. 30 capsule 2  . rosuvastatin (CRESTOR) 10 MG tablet Take 1 tablet twice weekly 90 tablet 1  . nitroGLYCERIN (NITROSTAT) 0.4 MG SL tablet Place 0.4 mg under the tongue every 5 (five) minutes as needed for chest pain.     No facility-administered medications prior to visit.    PAST MEDICAL HISTORY: Past Medical History  Diagnosis Date  . Chest pain     Myocardial infarction ruled out  . Hyperlipidemia   . Ulcerative proctitis   . Hypertensive cardiovascular disease   . Paroxysmal atrial flutter     History of Paroxysmal atrial flutter   . Scarlet fever 1945  . Atrial fibrillation 1962  . Esophageal reflux   . HTN (hypertension)   . Leg pain, bilateral     PAST SURGICAL HISTORY: Past Surgical History  Procedure Laterality Date  . Cardiac catheterization  03/03/2008    Left heart cardiac catheterization and coronary  . Knee arthroscopy  1991, 1995  . Ankle reconstruction Right 2007    [place]  . Lumbar laminectomy  12/2010  . Dex insection Left 12/2012    ear  . Cardiac catheterization  1997    Dr Wynonia Lawman    FAMILY HISTORY: Family History  Problem Relation Age of Onset  . Prostate cancer Father   . Heart attack Brother   . Kidney cancer Brother   . Brain cancer Brother     SOCIAL HISTORY: History   Social History  . Marital Status: Married    Spouse Name: Bethena Roys  . Number of Children: 3  . Years of Education: college   Occupational History  .      Retired    Social History Main Topics  . Smoking status: Former Research scientist (life sciences)  . Smokeless tobacco: Never Used     Comment: quit 1980  . Alcohol Use: 0.0 oz/week    0 Standard drinks or equivalent per week     Comment: 1-3 weekly  . Drug  Use: No  . Sexual Activity: Not on file   Other Topics Concern  . Not on file   Social History Narrative   Patient is retired and lives at home with his wife Bethena Roys.    Education college.   Caffeine one cup daily.      PHYSICAL EXAM  Filed Vitals:   09/29/14 1100  BP: 133/84  Pulse: 58  Height: 5\' 10"  (1.778 m)  Weight: 252 lb (114.306 kg)   Body mass index is 36.16 kg/(m^2).   PHYSICAL EXAMNIATION:  Gen: NAD, conversant, well nourised, obese, well groomed                     Cardiovascular: Regular rate rhythm, no peripheral edema, warm, nontender. Eyes: Conjunctivae clear without exudates or hemorrhage Neck: Supple, no carotid bruise. Pulmonary: Clear to auscultation bilaterally   NEUROLOGICAL EXAM:  MENTAL STATUS: Speech:    Speech is normal; fluent and spontaneous with normal comprehension.  Cognition:    The patient is oriented to person, place, and time;     recent and remote memory intact;     language fluent;     normal attention, concentration,     fund of knowledge.  CRANIAL NERVES: CN II: Visual fields are full to confrontation. Fundoscopic exam is normal with sharp discs and no vascular changes. Venous pulsations are present bilaterally. Pupils are 4 mm and briskly reactive to light. Visual acuity is 20/20 bilaterally. CN III, IV, VI: extraocular movement are normal. No ptosis. CN V: Facial sensation is intact to pinprick in all 3 divisions bilaterally. Corneal responses are intact.  CN VII: Face is symmetric with normal eye closure and smile. CN VIII: Hearing is normal to rubbing fingers CN IX, X: Palate elevates symmetrically. Phonation is normal. CN XI: Head turning and shoulder shrug are intact CN XII: Tongue is midline with normal movements and no atrophy.  MOTOR: There is no pronator drift of out-stretched arms. Muscle bulk and tone are normal. Muscle strength is normal.   Shoulder abduction Shoulder external rotation Elbow flexion Elbow  extension Wrist flexion Wrist extension Finger abduction Hip flexion Knee flexion Knee extension Ankle dorsi flexion Ankle plantar flexion  R 5 5 5 5 5 5 5 5 5 5 5 5   L 5 5 5 5 5 5 5 5 5 5 5 5     REFLEXES: Reflexes are 2+ and symmetric at the biceps, triceps, knees, and ankles. Plantar responses are flexor.  SENSORY: Light touch, pinprick, position sense, and vibration sense are intact in fingers and toes.  COORDINATION: Rapid alternating movements and fine finger movements are intact. There is no dysmetria on finger-to-nose and heel-knee-shin. There are no abnormal or extraneous movements.   GAIT/STANCE: Posture is normal. Mild antalgic gait Romberg is absent.   DIAGNOSTIC DATA (LABS, IMAGING, TESTING) - I reviewed patient records, labs, notes, testing and imaging myself where available.  Lab Results  Component Value Date   WBC 7.6 11/26/2010   HGB 13.7 11/26/2010   HCT 39.5 11/26/2010   MCV 93.4 11/26/2010   PLT 170 11/26/2010      Component Value Date/Time   NA 140 10/08/2013 0739   K 4.8 10/08/2013 0739   CL 106 10/08/2013 0739   CO2 28 10/08/2013 0739   GLUCOSE 98 10/08/2013 0739   BUN 27* 10/08/2013 0739   CREATININE 1.5 10/08/2013 0739   CALCIUM 9.0 10/08/2013 0739   PROT 6.5 05/09/2014 0853   ALBUMIN 4.0 05/09/2014 0853   AST 21 05/09/2014 0853   ALT 15 05/09/2014 0853   ALKPHOS 54 05/09/2014 0853   BILITOT 0.6 05/09/2014 0853   GFRNONAA >60 11/26/2010 0817   GFRAA >60 11/26/2010 0817   Lab Results  Component Value Date   CHOL 116 05/09/2014   HDL 20* 05/09/2014   LDLCALC 56 05/09/2014   TRIG 199* 05/09/2014  CHOLHDL 5.7 03/03/2008      ASSESSMENT AND PLAN 74 y.o. year old male with   Restless leg syndrome, low iron level,  His symptoms has much improved with iron supplement, Continue clonazepam 0.5 milligrams once every night, Lyrica 50 mg 2 tablets every night Return to clinic in 2 months   Marcial Pacas, M.D. Ph.D.  [facility] [place], [place] [address] Phone: [phone] Fax:      [phone]

## 2014-11-10 ENCOUNTER — Other Ambulatory Visit (INDEPENDENT_AMBULATORY_CARE_PROVIDER_SITE_OTHER): Payer: Medicare Other | Admitting: *Deleted

## 2014-11-10 DIAGNOSIS — E782 Mixed hyperlipidemia: Secondary | ICD-10-CM | POA: Diagnosis not present

## 2014-11-10 LAB — HEPATIC FUNCTION PANEL
ALBUMIN: 4 g/dL (ref 3.5–5.2)
ALK PHOS: 66 U/L (ref 39–117)
ALT: 20 U/L (ref 0–53)
AST: 25 U/L (ref 0–37)
BILIRUBIN TOTAL: 0.3 mg/dL (ref 0.2–1.2)
Bilirubin, Direct: 0.1 mg/dL (ref 0.0–0.3)
Total Protein: 6.5 g/dL (ref 6.0–8.3)

## 2014-11-10 NOTE — Addendum Note (Signed)
Addended by: Eulis Foster on: 11/10/2014 09:42 AM   Modules accepted: Orders

## 2014-11-12 LAB — NMR LIPOPROFILE WITH LIPIDS
Cholesterol, Total: 146 mg/dL (ref 100–199)
HDL PARTICLE NUMBER: 23.3 umol/L — AB (ref >=30.5)
HDL Size: 8.4 nm — ABNORMAL LOW (ref >=9.2)
HDL-C: 27 mg/dL — AB (ref >39)
LARGE VLDL-P: 5.8 nmol/L — AB (ref <=2.7)
LDL CALC: 84 mg/dL (ref 0–99)
LDL Particle Number: 1302 nmol/L — ABNORMAL HIGH (ref <1000)
LDL Size: 20.2 nm (ref >=20.8)
LP-IR Score: 75 — ABNORMAL HIGH (ref <=45)
Large HDL-P: <1.3 umol/L — ABNORMAL LOW (ref >=4.8)
Small LDL Particle Number: 828 nmol/L — ABNORMAL HIGH (ref <=527)
Triglycerides: 175 mg/dL — ABNORMAL HIGH (ref 0–149)
VLDL Size: 47.9 nm — ABNORMAL HIGH (ref <=46.6)

## 2014-11-17 ENCOUNTER — Ambulatory Visit (INDEPENDENT_AMBULATORY_CARE_PROVIDER_SITE_OTHER): Payer: Medicare Other | Admitting: Pharmacist

## 2014-11-17 DIAGNOSIS — E782 Mixed hyperlipidemia: Secondary | ICD-10-CM

## 2014-11-17 MED ORDER — ROSUVASTATIN CALCIUM 10 MG PO TABS: ORAL_TABLET | ORAL | Status: DC

## 2014-11-17 NOTE — Progress Notes (Signed)
Patient is a pleasant 74 y.o. WM referred to lipid clinic by Dr. Martinique given elevated LDL-P number (3,000 nmol/L) and inability to tolerate lipid lowering therapy.  Patient added Crestor 10 mg once weekly to his Zetia and Lovaza in 10/2013 and saw significant improvement in LDL and LDL-P but was still above goal so this was increased to Crestor 10mg  two days of the week in August.  He denies any muscle aches or joint stiffness.  Patient tells me that he is now seeing a neurologist and after starting iron and Lyrica, he now has no other pains.  He feels this may have contributed to his leg aches in the past, and may not all have been from statins.  He wants to increase his Crestor dose after seeing his labs on MyChart.  He has a cath in 2010 which was negative for CAD.    RF:  HTN, age, low HDL, obesity, LDL-P number of 3,000 - LDL goal at least < 100, and LDL-P number goal preferably < 1300 nmol/L Meds:  Zetia 10 mg qd, Crestor 10 mg twice weekly, fish oil 4 g/d. Intolerant:  Lipitor 20 mg qd, Crestor tiw, Lopid, NIacin, Welchol, red yeast rice  Exercise:  Pt is able to work out in his yard and garden now that his leg pain has resolved.  Social history:  Former smoker.  Drinks alcohol occasionally. Diet:  Eats reasonable low fat diet already, and states that he has tried to do a better job at limiting sugars and fats.  His wife is a retired Marine scientist and helps him keep track of this.   Labs:   11/2014: LDL-P 1302, LDL 84, TG 175, HDL 27, TC 146, LFTs normal (Crestor 10mg  2x/week, Zetia 10mg , Fish oil 4g/d) 05/2014: LDL-P 962, LDL 56, TG 199, HDL 20, TC 116, LFTs normal (Crestor 10mg  2x/week, Zetia 10mg , Fish oil 4g/d) 01/2014:  LDL-P number 1439, LDL 68, TG 214, HDL 24, LFTs normal (Crestor 10 mg once weekly, Zetia 10 mg qd, Lovaza 4 g/d) 10/2013:  LDL-P number 3115, LDL 145, TG 210, HDL 27, TC 214, LFTs normal, Glucose 98, Scr 1.5 (Lovaza 4 g/d only).  Zetia was started soon after these labs were  drawn.  Current Outpatient Prescriptions  Medication Sig Dispense Refill  . ACETAMINOPHEN PO Take 200 mg by mouth every 6 (six) hours as needed.    . Ascorbic Acid (VITAMIN C PO) Take 500 mg by mouth daily.     Marland Kitchen aspirin 81 MG tablet Take 81 mg by mouth daily.    . carvedilol (COREG) 3.125 MG tablet Take 3.125 mg by mouth 2 (two) times daily with a meal.    . clonazePAM (KLONOPIN) 0.5 MG tablet Take 1 tablet (0.5 mg total) by mouth at bedtime. One to 2 tabs po qhs 30 tablet 5  . COENZYME Q-10 PO Take 1,000 mg by mouth daily.     Marland Kitchen ezetimibe (ZETIA) 10 MG tablet Take 1 tablet (10 mg total) by mouth daily. 30 tablet 6  . ferrous sulfate 325 (65 FE) MG tablet Take 1 tablet (325 mg total) by mouth 3 (three) times daily with meals. 90 tablet 11  . lisinopril (PRINIVIL,ZESTRIL) 2.5 MG tablet Take 1 tablet (2.5 mg total) by mouth daily. 90 tablet 3  . MULTIPLE VITAMIN PO Take 1 capsule by mouth daily.     Marland Kitchen omeprazole (PRILOSEC OTC) 20 MG tablet Take 20 mg by mouth daily.    . pregabalin (LYRICA) 50  MG capsule Take 3 capsules (150 mg total) by mouth at bedtime. 90 capsule 6  . rosuvastatin (CRESTOR) 10 MG tablet Take 1 tablet by mouth every day or as directed 90 tablet 1  . sertraline (ZOLOFT) 50 MG tablet 50 mg. Taking three times weekly.    . traMADol (ULTRAM) 50 MG tablet      No current facility-administered medications for this visit.   Allergies  Allergen Reactions  . Atorvastatin   . Compazine [Prochlorperazine Edisylate]   . Gemfibrozil   . Monascus Purpureus Went Yeast   . Penicillins   . Rosuvastatin     Failed Crestor tiw due to leg aches (tolerated Crestor qweek and biw)  . Welchol [Colesevelam Hcl]     Leg aches   Family History  Problem Relation Age of Onset  . Prostate cancer Father   . Heart attack Brother   . Kidney cancer Brother   . Brain cancer Brother     .Assessment and Plan 1.  Hyperlipidemia-  Pt's LDL and LDL-P did increase over the past 6 months.  They  are still much better than baseline.  He has now identified the cause of his past pains were not statin related so he is willing to go up on the Crestor dose.  Will have him titrate 1 extra day a week each month to see what the max dose he may tolerate will be.  Will recheck labs in 6 months.

## 2014-11-17 NOTE — Patient Instructions (Signed)
Increase your Crestor by one extra tablet per week every month to see how you tolerate.  If you start having problems, please call Gay Filler at [phone]  We will recheck your labs in 6 months.

## 2014-12-29 ENCOUNTER — Ambulatory Visit: Payer: Medicare Other | Admitting: Neurology

## 2015-01-03 ENCOUNTER — Ambulatory Visit (INDEPENDENT_AMBULATORY_CARE_PROVIDER_SITE_OTHER): Payer: Medicare Other | Admitting: Neurology

## 2015-01-03 ENCOUNTER — Encounter: Payer: Self-pay | Admitting: Neurology

## 2015-01-03 VITALS — BP 138/82 | HR 64 | Ht 70.0 in | Wt 256.0 lb

## 2015-01-03 DIAGNOSIS — I10 Essential (primary) hypertension: Secondary | ICD-10-CM

## 2015-01-03 DIAGNOSIS — R799 Abnormal finding of blood chemistry, unspecified: Secondary | ICD-10-CM

## 2015-01-03 DIAGNOSIS — R790 Abnormal level of blood mineral: Secondary | ICD-10-CM

## 2015-01-03 DIAGNOSIS — G2581 Restless legs syndrome: Secondary | ICD-10-CM

## 2015-01-03 MED ORDER — FERROUS FUM-IRON POLYSACCH-FA 162-115.2-1 MG PO CAPS: 1.0000 | ORAL_CAPSULE | Freq: Every day | ORAL | Status: DC

## 2015-01-03 NOTE — Progress Notes (Signed)
Chief Complaint  Patient presents with  . Restless Leg Syndrome    Reports his symptoms being well controlled with the combination of clonazepam, Lyrica and his iron supplement.  He is no longer having to use hydrocodone.  . Muscle Pain      PATIENT: [name] DOB: 19-Aug-1940  REASON FOR VISIT: follow up- RLS HISTORY FROM: patient  HISTORY OF PRESENT ILLNESS: [name] is a 74 year old male with a history of restless leg syndrome. He returns today for follow-up. The patient was just recently seen earlier this month for his restless legs syndrome. Dr. Krista Hawkins started him on Klonopin. The patient states that he cannot tolerate this medication it caused him to be confused and affected his balance. He has stopped this medication. The patient states that the pain in both his legs have increased. He was taking hydrocodone 1-2 tablets at bedtime and that helps some. He states that he has burning and tingling that travels up and down the entire leg bilaterally. He will sometimes have swelling in the right ankle which he contributes to a prior injury. He states that he will have to use ice packs or a warm bath to control his discomfort. He also states that putting his bare feet on ice does calm down the symptoms. In the past he saw [name] and has received spinal injections. Patient states that Dr. Krista Hawkins asked him to hold off on this for now. The patient states that he has had nerve conduction studies in the past that was unremarkable. The patient returns today for an evaluation.   HISTORY 08/19/14 [name]): [name] is a 74 yo RH male, accompanied by his wife, referred by [name], and his primary care physician [name] for evaluation of chronic low back pain, bilateral lower extremity muscle ache  He had a past medical history of hypertension, hyperlipidemia, presenting with chronic low back pain, bilateral lower extremity muscle achy pain since 2010.   Initially it was thought due to his statin  treatment, he has tried different statin without improving his symptoms,over the years, he also received multiple lumbar bilateral facet joint fluoroscopy guided injection without improving his symptoms.   In 2012, he was diagnosed with L4 and 5 lumbar stenosis, had lumbar laminectomy L3-4, L4-L5 by [name] in July 2012,without improving his symptoms,  He was seen by different specialists, tried different medications, this including Elavil, Xanax as needed, he is currently taking hydrocodone 5/325 mg 1-2 tablets every night, Xanax 0.25 milligram as needed, Flexeril 10 mg every night, gabapentin 600 mg every night without significant improvement,  During the daytime, he denies significant low back pain, or bilateral lower extremity pain, he does has history of right ankle fracture, mild gait difficulty due to right ankle pain, but starting at evening time, he began to noticed deep achy pain starting from midline low back, radiating along bilateral lateral leg, constant, he tends to pace around, has difficulty sleeping at nighttime, sometimes he has to got up take a hot bath couple times every night, put ice on his back, on his feet, only provide temporary relief.  He has also tried acupuncture, massage, TENS unit, without significant improvement, previously, he has a short trial of low-dose Requip, without improving his symptoms, no significant side effect noticed either.  He has bilateral lateral 3 toes numbness, no weakness, no bilateral fingertips numbness, or weakness  Per record, electrodiagnostic study failed to demonstrate significant etiology,  We have reviewed MRI lumbar in 2012,  there was evidence of L4-5 spinal stenosis, and the most recent MRI Lumbar was in June 2014,progression of multilevel facet arthropathy, status post a laminectomy at L3-4, with residual large synovial cyst extending along the posterior epidural space without significant central canal stenosis, mild foraminal  narrowing bilaterally at L3 and 4, progression of mild lateral recess narrowing bilaterally at L4-5, foraminal narrowing is stable at this level  MRI of the brain in June 2014 showed mild atrophy, no acute intracranial abnormality  UPDATE Dec 15th 2015:  He is tolerating gabapentin 300 mg 2 tablets every night, Requip 1 mg 2 tablets every night, he can sleep better with the medications, but continue complains of bilateral lower extremity dull achy pain, numbness, urgency to move, especially at nighttime, He also complains of slow progressive gait difficulty, grasps things for support, he has urinary urgency, but no incontinence.  UPDATE Jan 29th 2016: He is now taking gabapentin 300mg  2 tabs qhs and requip 1mg , 3 tabs qhs, which has been very helpful, 30%, improvement, he still has bilatarel leg pain at evening, stretching,massage, He denies bowel and bladder incontinence , he has tinlging at the soles of his feet.   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  He continue complaining significant difficulty at nighttime, came in with a full page list of symptoms, in the morning time, he denies significant back pain, no low back pain, symptoms started in the late afternoon, gradually building up, around 9: to10 PM, he noticed achy feeling down lateral side of both leg, increased by sitting still, intense urge to lie on the floor, stretching muscle backwards, late night hot soaking bath to help him sleep, he has to take about 3 AM last night, difficulty sleeping, excessive fatigue during the daytime, could not sit through movies, standing up in the aisle 2/3 of the movie He is now taking,  hydrocodone 5/325mg  2 tabs 9pm, advil 200mg  2 tab   He has quit taking Requip 1 mg, up to 4 tablets every night, also gabapentin, complains of upset stomach, without relieving his symptoms, he is now taking Advil 200 mg 2 tablets as needed, hydrocodone/Tylenol 5/325 mg 2 tablets every night for symptoms control, which only mild, temporarily, Tylenol helps some too.  UPDATE April 29th 2016: Laboratory showed low iron, ferritin level at 9, he was put on iron supplement, his restless leg symptoms has much improved, He is now taking clonazepam 0.5 milligram every night, Lyrica 50 mg every night, he can sleep much better,  He continue, combat right ankle pain  UPDATE August 2nd 2016: He sleeps well, he is now taking clonazepam 0.5mg  qhs, Lyrica 50mg  3 tabs qhs, no longer on  Hydrocodone, he complains of side effect,  REVIEW OF SYSTEMS: Out of a complete 14 system review of symptoms, the patient complains only of the following symptoms, and all other reviewed systems are negative.  Restless leg, insomnia, activity change, fatigue  ALLERGIES: Allergies  Allergen Reactions  . Atorvastatin   . Compazine [Prochlorperazine Edisylate]   . Gemfibrozil   . Monascus Purpureus Went Yeast   . Penicillins   . Rosuvastatin     Failed Crestor tiw due to leg aches (tolerated Crestor qweek and biw)  .  Welchol [Colesevelam Hcl]     Leg aches    HOME MEDICATIONS: Outpatient Prescriptions Prior to Visit  Medication Sig Dispense Refill  . ACETAMINOPHEN PO Take 200 mg by mouth every 6 (six) hours as needed.    . Ascorbic Acid (VITAMIN C PO) Take 500 mg by mouth daily.     Marland Kitchen aspirin 81 MG tablet Take 81 mg by mouth daily.    . carvedilol (COREG) 3.125 MG tablet Take 3.125 mg by mouth 2 (two) times daily with a meal.    . clonazePAM (KLONOPIN) 0.5 MG tablet Take 1 tablet (0.5 mg total) by mouth at bedtime. One to 2 tabs po qhs 30 tablet 5  . COENZYME Q-10 PO Take 1,000 mg by mouth daily.     Marland Kitchen ezetimibe  (ZETIA) 10 MG tablet Take 1 tablet (10 mg total) by mouth daily. 30 tablet 6  . ferrous sulfate 325 (65 FE) MG tablet Take 1 tablet (325 mg total) by mouth 3 (three) times daily with meals. 90 tablet 11  . lisinopril (PRINIVIL,ZESTRIL) 2.5 MG tablet Take 1 tablet (2.5 mg total) by mouth daily. 90 tablet 3  . MULTIPLE VITAMIN PO Take 1 capsule by mouth daily.     Marland Kitchen omeprazole (PRILOSEC OTC) 20 MG tablet Take 20 mg by mouth daily.    . pregabalin (LYRICA) 50 MG capsule Take 3 capsules (150 mg total) by mouth at bedtime. 90 capsule 6  . rosuvastatin (CRESTOR) 10 MG tablet Take 1 tablet by mouth every day or as directed 90 tablet 1  . sertraline (ZOLOFT) 50 MG tablet 50 mg. Taking three times weekly.    . traMADol (ULTRAM) 50 MG tablet      No facility-administered medications prior to visit.    PAST MEDICAL HISTORY: Past Medical History  Diagnosis Date  . Chest pain     Myocardial infarction ruled out  . Hyperlipidemia   . Ulcerative proctitis   . Hypertensive cardiovascular disease   . Paroxysmal atrial flutter     History of Paroxysmal atrial flutter   . Scarlet fever 1945  . Atrial fibrillation 1962  . Esophageal reflux   . HTN (hypertension)   . Leg pain, bilateral     PAST SURGICAL HISTORY: Past Surgical History  Procedure Laterality Date  . Cardiac catheterization  03/03/2008    Left heart cardiac catheterization and coronary  . Knee arthroscopy  1991, 1995  . Ankle reconstruction Right 2007    [place]  . Lumbar laminectomy  12/2010  . Dex insection Left 12/2012    ear  . Cardiac catheterization  1997    Dr Wynonia Lawman    FAMILY HISTORY: Family History  Problem Relation Age of Onset  . Prostate cancer Father   . Heart attack Brother   . Kidney cancer Brother   . Brain cancer Brother     SOCIAL HISTORY: History   Social History  . Marital Status: Married    Spouse Name: Bethena Roys  . Number of Children: 3  . Years of Education: college   Occupational History  .        Retired    Social History Main Topics  . Smoking status: Former Research scientist (life sciences)  . Smokeless tobacco: Never Used     Comment: quit 1980  . Alcohol Use: 0.0 oz/week    0 Standard drinks or equivalent per week     Comment: 1-3 weekly  . Drug Use: No  . Sexual Activity: Not on file   Other  Topics Concern  . Not on file   Social History Narrative   Patient is retired and lives at home with his wife Bethena Roys.    Education college.   Caffeine one cup daily.      PHYSICAL EXAM  Filed Vitals:   01/03/15 1148  BP: 138/82  Pulse: 64  Height: 5\' 10"  (1.778 m)  Weight: 256 lb (116.121 kg)   Body mass index is 36.73 kg/(m^2).   PHYSICAL EXAMNIATION:  Gen: NAD, conversant, well nourised, obese, well groomed                     Cardiovascular: Regular rate rhythm, no peripheral edema, warm, nontender. Eyes: Conjunctivae clear without exudates or hemorrhage Neck: Supple, no carotid bruise. Pulmonary: Clear to auscultation bilaterally   NEUROLOGICAL EXAM:  MENTAL STATUS: Speech:    Speech is normal; fluent and spontaneous with normal comprehension.  Cognition:    The patient is oriented to person, place, and time;     recent and remote memory intact;     language fluent;     normal attention, concentration,     fund of knowledge.  CRANIAL NERVES: CN II: Visual fields are full to confrontation.  Pupils were equal round reactive to light CN III, IV, VI: extraocular movement are normal. No ptosis. CN V: Facial sensation is intact to pinprick in all 3 divisions bilaterally. Corneal responses are intact.  CN VII: Face is symmetric with normal eye closure and smile. CN VIII: Hearing is normal to rubbing fingers CN IX, X: Palate elevates symmetrically. Phonation is normal. CN XI: Head turning and shoulder shrug are intact CN XII: Tongue is midline with normal movements and no atrophy.  MOTOR: There is no pronator drift of out-stretched arms. Muscle bulk and tone are normal. Muscle  strength is normal.   REFLEXES: Reflexes are 2+ and symmetric at the biceps, triceps, knees, and ankles. Plantar responses are flexor.  SENSORY:  length dependent decreased light touch,  Vibratory sensation to distal shin level  COORDINATION: Rapid alternating movements and fine finger movements are intact. There is no dysmetria on finger-to-nose and heel-knee-shin.   GAIT/STANCE: Posture is normal. Mild antalgic gait Romberg is absent.   DIAGNOSTIC DATA (LABS, IMAGING, TESTING) - I reviewed patient records, labs, notes, testing and imaging myself where available.  Lab Results  Component Value Date   WBC 7.6 11/26/2010   HGB 13.7 11/26/2010   HCT 39.5 11/26/2010   MCV 93.4 11/26/2010   PLT 170 11/26/2010      Component Value Date/Time   NA 140 10/08/2013 0739   K 4.8 10/08/2013 0739   CL 106 10/08/2013 0739   CO2 28 10/08/2013 0739   GLUCOSE 98 10/08/2013 0739   BUN 27* 10/08/2013 0739   CREATININE 1.5 10/08/2013 0739   CALCIUM 9.0 10/08/2013 0739   PROT 6.5 11/10/2014 0943   ALBUMIN 4.0 11/10/2014 0943   AST 25 11/10/2014 0943   ALT 20 11/10/2014 0943   ALKPHOS 66 11/10/2014 0943   BILITOT 0.3 11/10/2014 0943   GFRNONAA >60 11/26/2010 0817   GFRAA >60 11/26/2010 0817   Lab Results  Component Value Date   CHOL 146 11/10/2014   HDL 27* 11/10/2014   LDLCALC 84 11/10/2014   TRIG 175* 11/10/2014   CHOLHDL 5.7 03/03/2008   ASSESSMENT AND PLAN 74 y.o. year old male with  Restless leg syndrome, low iron level,  His symptoms has much improved with iron supplement, will change to tamdem one  tablets daily  Continue clonazepam 0.5 milligrams once every night, Lyrica 50 mg 3 tablets every night Gait difficulty:  Due to combination of right ankle pain, peripheral neuropathy,  Limited exercise capacity because previous right ankle surgery   Marcial Pacas, M.D. Ph.D.  [facility] [place], [place] [address] Phone: [phone] Fax:       [phone]

## 2015-01-04 LAB — FERRITIN: Ferritin: 54 ng/mL (ref 30–400)

## 2015-01-04 LAB — IRON AND TIBC
IRON SATURATION: 47 % (ref 15–55)
Iron: 141 ug/dL (ref 38–169)
Total Iron Binding Capacity: 300 ug/dL (ref 250–450)
UIBC: 159 ug/dL (ref 111–343)

## 2015-01-06 ENCOUNTER — Encounter: Payer: Self-pay | Admitting: Neurology

## 2015-01-17 ENCOUNTER — Telehealth: Payer: Self-pay | Admitting: Neurology

## 2015-01-17 NOTE — Telephone Encounter (Signed)
Dr [name],  Something very disturbing happened to me last night, (actually this morning, at 5 AM). While soundly asleep, I lost control of my bladder and awoke in a very wet bed.  This has never happened to me before in my memory. Could this be a side effect of the calming effect of Clonazepam 0.5 mg? Perhaps the calming of my brain and nerves may becoming too calming? Should I cut the pill in half?   The only change in my medication is the adding of the Se-Tan Plus vitamin with iron in place of the 3 iron supplements per day.Marland KitchenMarland KitchenThat should not cause this.   This is very, very worrisome to me.  Thank you,  [name]   Spoke to patient, says this was an isolated incident and he felt he was in such a deep sleep that he just did not wake up to go to the bathroom.  He has already cut his clonazepam in half and he has not had any further episodes.  Also, per Dr. [name], he can use clonazepam on just a prn basis.

## 2015-01-17 NOTE — Telephone Encounter (Signed)
Pt called, stated he sent email 01/06/15 and has not recvd a response. He would like for you to call him.

## 2015-01-23 ENCOUNTER — Encounter: Payer: Self-pay | Admitting: Cardiology

## 2015-01-23 ENCOUNTER — Ambulatory Visit (INDEPENDENT_AMBULATORY_CARE_PROVIDER_SITE_OTHER): Payer: Medicare Other | Admitting: Cardiology

## 2015-01-23 VITALS — BP 130/88 | HR 63 | Ht 70.0 in | Wt 255.0 lb

## 2015-01-23 DIAGNOSIS — E782 Mixed hyperlipidemia: Secondary | ICD-10-CM

## 2015-01-23 DIAGNOSIS — I10 Essential (primary) hypertension: Secondary | ICD-10-CM | POA: Diagnosis not present

## 2015-01-23 NOTE — Progress Notes (Signed)
[name] Date of Birth: [birth year] Medical Record #[id number]  History of Present Illness: [name] is seen for follow up of hyperlipidemia. He has a history of mixed hyperlipidemia, HTN, and obesity.  He has been intolerant to statins in the past due to severe myalgias.  Welchol apparently made no change in his lipid levels. Niacin was associated with severe flushing.   He has no history of vascular disease. He had normal cardiac caths in 1998 and 2009. No history of PAD or CVA/TIA.  He apparently had afib in 1962 but no documented recurrence.  He is currently on Zetia daily and Crestor 3 days/week. Tolerating this well. More inactive due to knee and ankle injury. Had recent cataract surgery.   Current Outpatient Prescriptions on File Prior to Visit  Medication Sig Dispense Refill  . ACETAMINOPHEN PO Take 200 mg by mouth every 6 (six) hours as needed.    . Ascorbic Acid (VITAMIN C PO) Take 500 mg by mouth daily.     Marland Kitchen aspirin 81 MG tablet Take 81 mg by mouth daily.    . carvedilol (COREG) 3.125 MG tablet Take 3.125 mg by mouth 2 (two) times daily with a meal.    . clonazePAM (KLONOPIN) 0.5 MG tablet Take 1 tablet (0.5 mg total) by mouth at bedtime. One to 2 tabs po qhs (Patient taking differently: Take 0.25 mg by mouth as needed. One to 2 tabs po qhs) 30 tablet 5  . COENZYME Q-10 PO Take 1,000 mg by mouth daily.     Marland Kitchen ezetimibe (ZETIA) 10 MG tablet Take 1 tablet (10 mg total) by mouth daily. 30 tablet 6  . Ferrous Fum-Iron Polysacch-FA 162-115.2-1 MG CAPS Take 1 tablet by mouth daily. 90 each 3  . lisinopril (PRINIVIL,ZESTRIL) 2.5 MG tablet Take 1 tablet (2.5 mg total) by mouth daily. 90 tablet 3  . omeprazole (PRILOSEC OTC) 20 MG tablet Take 20 mg by mouth daily.    . pregabalin (LYRICA) 50 MG capsule Take 3 capsules (150 mg total) by mouth at bedtime. 90 capsule 6  . rosuvastatin (CRESTOR) 10 MG tablet Take 1 tablet by mouth every day or as directed (Patient taking differently: Take  10 mg by mouth. Take 1 tablet by mouth three times each week.) 90 tablet 1  . sertraline (ZOLOFT) 50 MG tablet 50 mg. Taking three times weekly.     No current facility-administered medications on file prior to visit.    Allergies  Allergen Reactions  . Atorvastatin   . Compazine [Prochlorperazine Edisylate]   . Gemfibrozil   . Monascus Purpureus Went Yeast   . Penicillins   . Rosuvastatin     Failed Crestor tiw due to leg aches (tolerated Crestor qweek and biw)  . Welchol [Colesevelam Hcl]     Leg aches    Past Medical History  Diagnosis Date  . Chest pain     Myocardial infarction ruled out  . Hyperlipidemia   . Ulcerative proctitis   . Hypertensive cardiovascular disease   . Paroxysmal atrial flutter     History of Paroxysmal atrial flutter   . Scarlet fever 1945  . Atrial fibrillation 1962  . Esophageal reflux   . HTN (hypertension)   . Leg pain, bilateral     Past Surgical History  Procedure Laterality Date  . Cardiac catheterization  03/03/2008    Left heart cardiac catheterization and coronary  . Knee arthroscopy  1991, 1995  . Ankle reconstruction Right 2007  [place]  . Lumbar laminectomy  12/2010  . Dex insection Left 12/2012    ear  . Cardiac catheterization  1997    Dr Wynonia Lawman    History  Smoking status  . Former Smoker  Smokeless tobacco  . Never Used    Comment: quit 1980    History  Alcohol Use  . 0.0 oz/week  . 0 Standard drinks or equivalent per week    Comment: 1-3 weekly    Family History  Problem Relation Age of Onset  . Prostate cancer Father   . Heart attack Brother   . Kidney cancer Brother   . Brain cancer Brother     Review of Systems: As noted in HPI.  All other systems were reviewed and are negative.  Physical Exam: BP 130/88 mmHg  Pulse 63  Ht 5\' 10"  (1.778 m)  Wt 115.667 kg (255 lb)  BMI 36.59 kg/m2 He is an obese WM in NAD.  HEENT: [place]/AT, PERRL, EOMI. Sclera are clear. Oropharynx is clear.  Neck: no JVD,  bruits, thyromegaly, or adenopathy. Lungs: clear CV: RRR, normal S1-2, no gallop, murmur,click Abdomen: obese, soft, NT, no masses, bruits, or HSM Ext: no cyanosis or edema. Pulses are 2+. Skin: warm and dry, no xanthalaismas or tendon xanthomas. Neuro: alert and oriented x 3. CN II-XII intact  LABORATORY DATA: Lab Results  Component Value Date   WBC 7.6 11/26/2010   HGB 13.7 11/26/2010   HCT 39.5 11/26/2010   PLT 170 11/26/2010   GLUCOSE 98 10/08/2013   CHOL 146 11/10/2014   TRIG 175* 11/10/2014   HDL 27* 11/10/2014   LDLCALC 84 11/10/2014   ALT 20 11/10/2014   AST 25 11/10/2014   NA 140 10/08/2013   K 4.8 10/08/2013   CL 106 10/08/2013   CREATININE 1.5 10/08/2013   BUN 27* 10/08/2013   CO2 28 10/08/2013   INR 0.9 03/02/2008   Recent lipoprotein analysis reviewed in Epic. LDL particle number reduced from 3115 to 962 and back to 1302. Small LDL particle number >2300 to 828. LDL reduced from 145 to 84. Triglycerides 175.     Assessment / Plan: 1. Mixed hyperlipidemia. Multiple drug intolerances including statins and niacin. Marked reduction with combination of Zetia and low dose Crestor increased to 3x/week. Continue current therapy. Follow up in 6 months.  2. HTN- controlled. Continue current therapy   3. Normal cardiac cath 2009.

## 2015-01-23 NOTE — Patient Instructions (Signed)
Continue your current therapy  I will see you in 6 months.   

## 2015-03-17 ENCOUNTER — Ambulatory Visit: Payer: Medicare Other | Attending: Otolaryngology | Admitting: Rehabilitative and Restorative Service Providers"

## 2015-03-17 DIAGNOSIS — M6281 Muscle weakness (generalized): Secondary | ICD-10-CM

## 2015-03-17 DIAGNOSIS — H8102 Meniere's disease, left ear: Secondary | ICD-10-CM | POA: Diagnosis present

## 2015-03-17 DIAGNOSIS — R269 Unspecified abnormalities of gait and mobility: Secondary | ICD-10-CM | POA: Diagnosis present

## 2015-03-17 DIAGNOSIS — R42 Dizziness and giddiness: Secondary | ICD-10-CM | POA: Diagnosis present

## 2015-03-17 NOTE — Therapy (Signed)
[facility] [address] [place], [place], [address] Phone: [phone]   Fax:  [phone]  Physical Therapy Evaluation  Patient Details  Name: [name] MRN: [id number] Date of Birth: 07-May-1941 Referring Provider: Dr. Cresenciano Lick  Encounter Date: 03/17/2015      PT End of Session - 03/17/15 1443    Visit Number 1   Number of Visits 12   Date for PT Re-Evaluation 04/30/15   Authorization Type G code every 10th visit   PT Start Time 0935   PT Stop Time 1020   PT Time Calculation (min) 45 min   Equipment Utilized During Treatment Gait belt   Activity Tolerance Patient tolerated treatment well   Behavior During Therapy [facility] for tasks assessed/performed      Past Medical History  Diagnosis Date  . Chest pain     Myocardial infarction ruled out  . Hyperlipidemia   . Ulcerative proctitis   . Hypertensive cardiovascular disease   . Paroxysmal atrial flutter     History of Paroxysmal atrial flutter   . Scarlet fever 1945  . Atrial fibrillation 1962  . Esophageal reflux   . HTN (hypertension)   . Leg pain, bilateral     Past Surgical History  Procedure Laterality Date  . Cardiac catheterization  03/03/2008    Left heart cardiac catheterization and coronary  . Knee arthroscopy  1991, 1995  . Ankle reconstruction Right 2007    [place]  . Lumbar laminectomy  12/2010  . Dex insection Left 12/2012    ear  . Cardiac catheterization  1997    Dr Wynonia Lawman    There were no vitals filed for this visit.  Visit Diagnosis:  Abnormality of gait  Meniere's disease, left  Dizziness and giddiness  Generalized muscle weakness      Subjective Assessment - 03/17/15 0939    Subjective The patient reports episodic dizziness x 13 years.  During spells, the patient experiences vertigo (spinning), vomitting, inability to walk without assistance that can last hours to days.  He notes some vertigo after spells resolve.  The  patient experiences these episodes 4 times/year.  He reports his ENT recommended he reduce salt intake.     Pertinent History The patient had dexamethasone injection in L ear.  Moderate hearing loss with tinnitus.  Patient c/o aural fullness.     Patient Stated Goals "I don't know if therapy is what I need"   Currently in Pain? No/denies  h/o back pain due to stenosis, didn't go to sleep until 4am due to bilateral leg pain            [facility] PT Assessment - 03/17/15 0946    Assessment   Medical Diagnosis Meniere's disease with h/o L ear injection of dex   Referring Provider Dr. Cresenciano Lick   Onset Date/Surgical Date --  chronic, intermittent in nature   Prior Therapy none   Precautions   Precautions Fall   Precaution Comments --  grabs environmental barriers for support    Balance Screen   Has the patient fallen in the past 6 months No   Has the patient had a decrease in activity level because of a fear of falling?  Yes   Is the patient reluctant to leave their home because of a fear of falling?  No   Home Environment   Living Environment Private residence   Living Arrangements Spouse/significant other   Type of [place] to  enter   Entrance Stairs-Number of Steps 3   Home Layout One level   [place] - single point   Observation/Other Assessments   Focus on Therapeutic Outcomes (FOTO)  51%   Other Surveys  --  DHI=72%   Sensation   Light Touch Appears Intact  tingling in legs from restless leg syndrome   Posture/Postural Control   Posture/Postural Control Postural limitations   Postural Limitations Rounded Shoulders;Forward head;Posterior pelvic tilt   ROM / Strength   AROM / PROM / Strength AROM;Strength   AROM   Overall AROM  Deficits   Overall AROM Comments WFLs except for L shoulder with h/o RTC tear 40+ years ago and decreased function over the past year.   Strength   Overall Strength Deficits   Overall Strength Comments R UE is 5/5, L  UE <3/5 due to h/o RTC tear, bilateral hip flexion 4/5, bilateral knee extension 5/5 (tightness with pain noted on extending knees in hamstrings), 5/5 bilateral knee flexion, 4/5 bilateral ankle dorsiflexion   Ambulation/Gait   Ambulation/Gait Yes   Ambulation/Gait Assistance 6: Modified independent (Device/Increase time)   Ambulation Distance (Feet) 200 Feet   Assistive device None   Gait Pattern --  unsteadiness   Ambulation Surface Level   Gait velocity 2.89 ft/sec   Gait Comments Dynamic gait activities iwth horizontal/vertical head turns with CGA worse with up/down   Standardized Balance Assessment   Standardized Balance Assessment Berg Balance Test;Timed Up and Go Test   Berg Balance Test   Sit to Stand Able to stand without using hands and stabilize independently   Standing Unsupported Able to stand safely 2 minutes   Sitting with Back Unsupported but Feet Supported on Floor or Stool Able to sit safely and securely 2 minutes   Stand to Sit Sits safely with minimal use of hands   Transfers Able to transfer safely, minor use of hands   Standing Unsupported with Eyes Closed Able to stand 10 seconds with supervision   Standing Ubsupported with Feet Together Able to place feet together independently and stand 1 minute safely   From Standing, Reach Forward with Outstretched Arm Can reach forward >12 cm safely (5")   From Standing Position, Pick up Object from Floor Able to pick up shoe safely and easily   From Standing Position, Turn to Look Behind Over each Shoulder Needs supervision when turning   Turn 360 Degrees Needs close supervision or verbal cueing   Standing Unsupported, Alternately Place Feet on Step/Stool Able to stand independently and safely and complete 8 steps in 20 seconds   Standing Unsupported, One Foot in Front Able to take small step independently and hold 30 seconds   Standing on One Leg Able to lift leg independently and hold > 10 seconds   Total Score 46   Berg  comment: 46/56 Berg score            Vestibular Assessment - 03/17/15 0950    Symptom Behavior   Type of Dizziness Spinning   Frequency of Dizziness --  episodic multiple times per year   Duration of Dizziness --  hours to days   Aggravating Factors --  episodic/spontaneous onset   Relieving Factors No known relieving factors   Occulomotor Exam   Occulomotor Alignment Normal   Spontaneous Absent   Gaze-induced Absent   Smooth Pursuits Intact   Saccades Intact   Vestibulo-Occular Reflex   VOR 1 Head Only (x 1 viewing) slow gaze x 1 x 5  reps without dizziness (neck tightness noted)      NEUROMUSCULAR RE-EDUCATION: Corner balance exercises consisting of: Standing on level surface tandem and then compliant surface with feet apart with eyes closed with supervision for safety.        PT Education - 03/17/15 1442    Education provided Yes   Education Details HEP: tandem stance in corner and pillow standing with eyes closed   Person(s) Educated Patient   Methods Explanation;Demonstration;Handout   Comprehension Verbalized understanding;Returned demonstration          PT Short Term Goals - 03/17/15 1443    PT SHORT TERM GOAL #1   Title The patient will be indep with HEP for balance, gait and general adaptation exercises   Baseline Target date 04/16/2015   Time 4   Period Weeks   PT SHORT TERM GOAL #2   Title The patient will improve gait speed from 2.89 ft/sec to > or equal to 3.4 ft/sec to demo improvement in functional mobility.   Baseline Target date 04/16/2015   Time 4   Period Weeks   PT SHORT TERM GOAL #3   Title The patient will improve balance demonstrating tandem stance without UE support x 10 seconds.   Baseline Target date 04/16/2015   Time 4   Period Weeks   PT SHORT TERM GOAL #4   Title The patient will perform 360 degree turn without loss of balance in <6 seconds to each direction.   Baseline Target date 04/16/2015   Time 4   Period Weeks   PT  SHORT TERM GOAL #5   Title The patient will perform home walking program with written instruction on progression.   Baseline Target date 04/16/2015   Time 4   Period Weeks           PT Long Term Goals - 03/17/15 1608    PT LONG TERM GOAL #1   Title The patient will verbalize understanding of community exercise program for post d/c.   Baseline Target date 11/27/206   Time 6   Period Weeks   PT LONG TERM GOAL #2   Title The patient will improve dizziness handicap index from 72% to < or equal to 50% to demo decreased self perception of balance.   Baseline Target date 04/30/2015   Time 6   Period Weeks   PT LONG TERM GOAL #3   Title The patient will improve Berg balance score from 46/56 to > or equal to 52/56.   Baseline Target date 04/30/2015   Time 6   Period Weeks               Plan - 03/17/15 1610    Clinical Impression Statement The patient is a 74 yo male with h/o L Meniere's Disease reporting declining mobility and balance over the past year.  He has not had a recept episode of vertigo, however has mild dizziness noted with turns, need to grab walls/rails for balance during functional tasks.  At today's evaluation, he presents with specific impairments of decreased core strength noted during balance activities (h/o back surgery 2012), difficulty with turns and compliant surface standing with eyes closed (most likely from h/o ankle surgery and h/o Meniere's), tightness in hamstrings from immobiity, decreased narrow base of support standing and general decline in confidence during mobiity.  PT explained multifactorial nature of balance issues impacting both sensory and motor control of balance.  Instructed patient in initial HEP to begin today.   Pt will benefit  from skilled therapeutic intervention in order to improve on the following deficits Abnormal gait;Difficulty walking;Decreased mobility;Decreased strength;Impaired sensation;Decreased balance;Impaired  flexibility;Decreased activity tolerance;Dizziness   Rehab Potential Good   PT Frequency 2x / week   PT Duration 6 weeks   PT Treatment/Interventions ADLs/Self Care Home Management;Balance training;Neuromuscular re-education;Gait training;Stair training;Functional mobility training;Therapeutic activities;Therapeutic exercise;Manual techniques;Patient/family education;Vestibular   PT Next Visit Plan Check HEP, add gaze x 1 adaptation, turns and balance activities with head turns to HEP.  Dynamic gait in the clinic and unlevel surface negotiation to tolerance.   Consulted and Agree with Plan of Care Patient;Family member/caregiver   Family Member Consulted spouse          G-Codes - 04-02-2015 1614    Functional Assessment Tool Used Berg=46/56, Dizziness handicap index=72%   Functional Limitation Mobility: Walking and moving around   Mobility: Walking and Moving Around Current Status [phone]) At least 40 percent but less than 60 percent impaired, limited or restricted   Mobility: Walking and Moving Around Goal Status [phone]) At least 20 percent but less than 40 percent impaired, limited or restricted       Problem List Patient Active Problem List   Diagnosis Date Noted  . Restless leg syndrome, uncontrolled 08/31/2014  . Restless leg syndrome 04/19/2014  . Leg pain, bilateral   . Normal coronary arteries 02/01/2014  . CKD (chronic kidney disease), stage III 07/05/2013  . Dysphagia 07/05/2013  . Esophageal reflux 07/05/2013  . Hearing difficulty 07/05/2013  . Hypertension 07/05/2013  . Impaired fasting glucose 07/05/2013  . Insomnia 07/05/2013  . Spinal stenosis 07/05/2013  . Major depressive disorder, recurrent episode ([place]) 07/05/2013  . Osteoarthrosis, unspecified whether generalized or localized, unspecified site 07/05/2013  . Tachyarrhythmia 07/05/2013  . 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[place]) 07/05/2013  . Mixed hyperlipidemia 05/24/2013    Jessi Pitstick, PT 02-Apr-2015,  4:16 PM  [facility] [address] [place], [place], [address] Phone: [phone]   Fax:  [phone]  Name: [name] MRN: [id number] Date of Birth: 02/21/1941

## 2015-03-17 NOTE — Patient Instructions (Signed)
Feet Apart (Compliant Surface) Varied Arm Positions - Eyes Closed    Stand on compliant surface: __pillow______ with feet shoulder width apart and arms at your sides. Close eyes and visualize upright position. Hold__30__ seconds. Repeat __3__ times per session. Do __2__ sessions per day.  Copyright  VHI. All rights reserved.   Feet Heel-Toe "Tandem", Varied Arm Positions - Eyes Open    With eyes open, right foot directly in front of the other, arms out, look straight ahead at a stationary object. Hold __30__ seconds. Repeat __3__ times per (each leg forward) session. Do _2___ sessions per day.  Copyright  VHI. All rights reserved.   HAVE HELP WHEN YOU BEGIN TRYING THESE.

## 2015-03-24 ENCOUNTER — Ambulatory Visit: Payer: Medicare Other | Admitting: Rehabilitative and Restorative Service Providers"

## 2015-03-24 DIAGNOSIS — H8102 Meniere's disease, left ear: Secondary | ICD-10-CM

## 2015-03-24 DIAGNOSIS — R269 Unspecified abnormalities of gait and mobility: Secondary | ICD-10-CM | POA: Diagnosis not present

## 2015-03-24 NOTE — Patient Instructions (Signed)
Knee Extension (Hamstring Stretch) With Pelvis Neutral    Slowly and gently bring foot up- can prop on floor. Be sure pelvis does not tip backward (flex) or rotate. Hold _30__ seconds. Do _3__ times, each leg, _1-2__ times per day.  http://ss.exer.us/88   Copyright  VHI. All rights reserved.  Turning in Place: Solid Surface    Standing in place, lead with head and turn slowly making full turns toward left and then to the right. Repeat __3-5__ times per session. Do __2__ sessions per day.   Copyright  VHI. All rights reserved.  Gaze Stabilization: Standing Feet Apart    Feet shoulder width apart, keeping eyes on target on wall _3___ feet away, tilt head down slightly and move head side to side for _10 times Do _2___ sessions per day. Copyright  VHI. All rights reserved.

## 2015-03-24 NOTE — Therapy (Signed)
[facility] [address] [place], [place], [address] Phone: [phone]   Fax:  [phone]  Physical Therapy Treatment  Patient Details  Name: [name] MRN: [id number] Date of Birth: 1941-05-24 Referring Provider: Dr. Cresenciano Lick  Encounter Date: 03/24/2015      PT End of Session - 03/24/15 1055    Visit Number 2   Number of Visits 12   Date for PT Re-Evaluation 04/30/15   Authorization Type G code every 10th visit   PT Start Time 0804   PT Stop Time 0850   PT Time Calculation (min) 46 min   Equipment Utilized During Treatment Gait belt   Activity Tolerance Patient tolerated treatment well   Behavior During Therapy [facility] for tasks assessed/performed      Past Medical History  Diagnosis Date  . Chest pain     Myocardial infarction ruled out  . Hyperlipidemia   . Ulcerative proctitis   . Hypertensive cardiovascular disease   . Paroxysmal atrial flutter     History of Paroxysmal atrial flutter   . Scarlet fever 1945  . Atrial fibrillation 1962  . Esophageal reflux   . HTN (hypertension)   . Leg pain, bilateral     Past Surgical History  Procedure Laterality Date  . Cardiac catheterization  03/03/2008    Left heart cardiac catheterization and coronary  . Knee arthroscopy  1991, 1995  . Ankle reconstruction Right 2007    [place]  . Lumbar laminectomy  12/2010  . Dex insection Left 12/2012    ear  . Cardiac catheterization  1997    Dr Wynonia Lawman    There were no vitals filed for this visit.  Visit Diagnosis:  Abnormality of gait  Meniere's disease, left      Subjective Assessment - 03/24/15 0808    Subjective The patient reports that his home exercises are getting easier.  He feels like his balance is improving with the HEP since eval.   Patient Stated Goals "I don't know if therapy is what I need"   Currently in Pain? No/denies     NEUROMUSCULAR RE-EDUCATION:  Sensory Organization Testing=60%  compared to age/height normative value of 65%.   Patient demonstrated WNLs use of somatosensory feedback for balance, WNLs use of visual feedback for balance, and moderately decreased use of vestibular feedback for balance.  Corner balance exercises consisting of: Standing on foam surface with feet apart with eyes closed with supervision for safety.  Gaze x 1 viewing adaptation x 10 repetition with report of visual blurring and cues for technique (provided for HEP)  Tandem and backwards walking along countertop with CGA for safety.  180 degree turns and 360 degree x 3 reps each with supervision with cues for visual spotting to targets.  Patient without significant dizizness today.    THERAPEUTIC EXERCISE: Hamstring stretch seated with cues on upright posture (provided for HEP).       [facility] Adult PT Treatment/Exercise - 03/24/15 1058    Ambulation/Gait   Ambulation/Gait Yes   Ambulation/Gait Assistance 7: Independent   Ambulation Distance (Feet) 400 Feet   Assistive device None   Ambulation Surface Level   Gait Comments patient with longer stride length and improved gait over evaluation.  He c/o tightness behind knees with gait activities.   Balance   Balance Assessed --           PT Education - 03/24/15 1055    Education provided Yes   Education Details HEP: 993  degree turns, gaze adaptation x 1 viewing, hamstring stretch   Person(s) Educated Patient   Methods Explanation;Demonstration;Handout   Comprehension Verbalized understanding;Returned demonstration          PT Short Term Goals - 03/24/15 1056    PT SHORT TERM GOAL #1   Title The patient will be indep with HEP for balance, gait and general adaptation exercises   Baseline Met on 03/24/2015   Time 4   Period Weeks   Status Achieved   PT SHORT TERM GOAL #2   Title The patient will improve gait speed from 2.89 ft/sec to > or equal to 3.4 ft/sec to demo improvement in functional mobility.   Baseline Target date  04/16/2015   Time 4   Period Weeks   PT SHORT TERM GOAL #3   Title The patient will improve balance demonstrating tandem stance without UE support x 10 seconds.   Baseline Target date 04/16/2015   Time 4   Period Weeks   PT SHORT TERM GOAL #4   Title The patient will perform 360 degree turn without loss of balance in <6 seconds to each direction.   Baseline Target date 04/16/2015   Time 4   Period Weeks   PT SHORT TERM GOAL #5   Title The patient will perform home walking program with written instruction on progression.   Baseline Target date 04/16/2015   Time 4   Period Weeks           PT Long Term Goals - 03/17/15 1608    PT LONG TERM GOAL #1   Title The patient will verbalize understanding of community exercise program for post d/c.   Baseline Target date 11/27/206   Time 6   Period Weeks   PT LONG TERM GOAL #2   Title The patient will improve dizziness handicap index from 72% to < or equal to 50% to demo decreased self perception of balance.   Baseline Target date 04/30/2015   Time 6   Period Weeks   PT LONG TERM GOAL #3   Title The patient will improve Berg balance score from 46/56 to > or equal to 52/56.   Baseline Target date 04/30/2015   Time 6   Period Weeks               Plan - 03/24/15 1056    Clinical Impression Statement The paitent is demonstrating improved balance with initiation of HEP at evaluation.  PT progressing balance and gait activities to improve functional mobility.    PT Next Visit Plan Progress gaze x 1 viewing, check HEP, dynamic gait.   Consulted and Agree with Plan of Care Patient        Problem List Patient Active Problem List   Diagnosis Date Noted  . Restless leg syndrome, uncontrolled 08/31/2014  . Restless leg syndrome 04/19/2014  . Leg pain, bilateral   . Normal coronary arteries 02/01/2014  . CKD (chronic kidney disease), stage III 07/05/2013  . Dysphagia 07/05/2013  . Esophageal reflux 07/05/2013  . Hearing  difficulty 07/05/2013  . Hypertension 07/05/2013  . Impaired fasting glucose 07/05/2013  . Insomnia 07/05/2013  . Spinal stenosis 07/05/2013  . Major depressive disorder, recurrent episode ([place]) 07/05/2013  . Osteoarthrosis, unspecified whether generalized or localized, unspecified site 07/05/2013  . Tachyarrhythmia 07/05/2013  . 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[place]) 07/05/2013  . Mixed hyperlipidemia 05/24/2013    Tally Mckinnon, PT 03/24/2015, 12:13 PM  [facility] [address] [place],  [place], [address] Phone: [phone]   Fax:  [phone]  Name: [name] MRN: [id number] Date of Birth: 1941-04-01

## 2015-03-28 ENCOUNTER — Ambulatory Visit: Payer: Medicare Other | Admitting: Rehabilitative and Restorative Service Providers"

## 2015-03-28 DIAGNOSIS — R269 Unspecified abnormalities of gait and mobility: Secondary | ICD-10-CM | POA: Diagnosis not present

## 2015-03-28 DIAGNOSIS — H8102 Meniere's disease, left ear: Secondary | ICD-10-CM

## 2015-03-28 DIAGNOSIS — R42 Dizziness and giddiness: Secondary | ICD-10-CM

## 2015-03-28 NOTE — Therapy (Signed)
[facility] [address] [place], [place], [address] Phone: [phone]   Fax:  [phone]  Physical Therapy Treatment  Patient Details  Name: [name] MRN: [id number] Date of Birth: January 07, 1941 Referring Provider: Dr. Cresenciano Lick  Encounter Date: 03/28/2015      PT End of Session - 03/28/15 0945    Visit Number 3   Number of Visits 12   Date for PT Re-Evaluation 04/30/15   Authorization Type G code every 10th visit   PT Start Time 0936   PT Stop Time 1016   PT Time Calculation (min) 40 min   Activity Tolerance Patient tolerated treatment well   Behavior During Therapy [facility] for tasks assessed/performed      Past Medical History  Diagnosis Date  . Chest pain     Myocardial infarction ruled out  . Hyperlipidemia   . Ulcerative proctitis   . Hypertensive cardiovascular disease   . Paroxysmal atrial flutter     History of Paroxysmal atrial flutter   . Scarlet fever 1945  . Atrial fibrillation 1962  . Esophageal reflux   . HTN (hypertension)   . Leg pain, bilateral     Past Surgical History  Procedure Laterality Date  . Cardiac catheterization  03/03/2008    Left heart cardiac catheterization and coronary  . Knee arthroscopy  1991, 1995  . Ankle reconstruction Right 2007    [place]  . Lumbar laminectomy  12/2010  . Dex insection Left 12/2012    ear  . Cardiac catheterization  1997    Dr Wynonia Lawman    There were no vitals filed for this visit.  Visit Diagnosis:  Abnormality of gait  Dizziness and giddiness  Meniere's disease, left      Subjective Assessment - 03/28/15 0942    Subjective The patient reports that in doing his hamstring stretches he hyperextended his right  knee and felt pain and weakness associated.  He held off on exercises on Saturday and Sunday and returned to stretches yesterday.  He felt symptoms improved.  The patient reports he was standing still on Sunday when he experienced a  10 minute episode of dizziness.  It wasn't severe, but described as "a little bit of unsteadiness".     Patient Stated Goals "I don't know if therapy is what I need"   Currently in Pain? No/denies           [facility] Adult PT Treatment/Exercise - 03/28/15 0950    Ambulation/Gait   Ambulation/Gait Yes   Ambulation/Gait Assistance 7: Independent   Ambulation Distance (Feet) 500 Feet   Assistive device None   Ambulation Surface Level   Gait Comments dynamic gait activities with horiz/vertical head turns    Neuro Re-ed    Neuro Re-ed Details  Wall bumps standing on rocker board with eyes open, progressing to eyes closed and then adding head turns with eyes open.  Added proactive balance activities on rocker board with UE reaching.  Performed tandem gait activities near wall decreasing UE support.     Exercises   Exercises Neck   Neck Exercises: Seated   Other Seated Exercise seated levator stretch x 20 seconds R and L sides         Vestibular Treatment/Exercise - 03/28/15 0946    Vestibular Treatment/Exercise   Vestibular Treatment Provided Gaze   Habituation Exercises Seated Horizontal Head Turns   Gaze Exercises X1 Viewing Horizontal   Seated Horizontal Head Turns   Number of Reps  5   Symptom Description  performed for neck A/ROM with HEP provided   X1 Viewing Horizontal   Foot Position standing feet apart   Time --  15 times   Reps 3   Comments patient has increasing neck tightness and reports letter moves as speed increases               PT Education - 03/28/15 1546    Education provided Yes   Education Details HEP: neck A/ROM rotation and diagonal/levator stretch   Person(s) Educated Patient   Methods Explanation;Demonstration;Handout   Comprehension Verbalized understanding;Returned demonstration          PT Short Term Goals - 03/24/15 1056    PT SHORT TERM GOAL #1   Title The patient will be indep with HEP for balance, gait and general adaptation  exercises   Baseline Met on 03/24/2015   Time 4   Period Weeks   Status Achieved   PT SHORT TERM GOAL #2   Title The patient will improve gait speed from 2.89 ft/sec to > or equal to 3.4 ft/sec to demo improvement in functional mobility.   Baseline Target date 04/16/2015   Time 4   Period Weeks   PT SHORT TERM GOAL #3   Title The patient will improve balance demonstrating tandem stance without UE support x 10 seconds.   Baseline Target date 04/16/2015   Time 4   Period Weeks   PT SHORT TERM GOAL #4   Title The patient will perform 360 degree turn without loss of balance in <6 seconds to each direction.   Baseline Target date 04/16/2015   Time 4   Period Weeks   PT SHORT TERM GOAL #5   Title The patient will perform home walking program with written instruction on progression.   Baseline Target date 04/16/2015   Time 4   Period Weeks           PT Long Term Goals - 03/17/15 1608    PT LONG TERM GOAL #1   Title The patient will verbalize understanding of community exercise program for post d/c.   Baseline Target date 11/27/206   Time 6   Period Weeks   PT LONG TERM GOAL #2   Title The patient will improve dizziness handicap index from 72% to < or equal to 50% to demo decreased self perception of balance.   Baseline Target date 04/30/2015   Time 6   Period Weeks   PT LONG TERM GOAL #3   Title The patient will improve Berg balance score from 46/56 to > or equal to 52/56.   Baseline Target date 04/30/2015   Time 6   Period Weeks               Plan - 03/28/15 1547    Clinical Impression Statement The patient is progressing well with HEP.  PT to continue working towards [place].  Patient's neck tightness (h/o guarding from vertigo + stiffness) is limiting performance on gaze adaptation x 1 viewing. PT added neck stretching to current HEP.   PT Next Visit Plan Progress gaze x 1 viewing, check HEP, dynamic gait.   Consulted and Agree with Plan of Care Patient         Problem List Patient Active Problem List   Diagnosis Date Noted  . Restless leg syndrome, uncontrolled 08/31/2014  . Restless leg syndrome 04/19/2014  . Leg pain, bilateral   . Normal coronary arteries 02/01/2014  . CKD (  chronic kidney disease), stage III 07/05/2013  . Dysphagia 07/05/2013  . Esophageal reflux 07/05/2013  . Hearing difficulty 07/05/2013  . Hypertension 07/05/2013  . Impaired fasting glucose 07/05/2013  . Insomnia 07/05/2013  . Spinal stenosis 07/05/2013  . Major depressive disorder, recurrent episode ([place]) 07/05/2013  . Osteoarthrosis, unspecified whether generalized or localized, unspecified site 07/05/2013  . Tachyarrhythmia 07/05/2013  . 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[place]) 07/05/2013  . Mixed hyperlipidemia 05/24/2013    Mickey Esguerra, PT 03/28/2015, 3:54 PM  [facility] [address] [place], [place], [address] Phone: [phone]   Fax:  [phone]  Name: YAHYA BOLDMAN MRN: [id number] Date of Birth: 1941-03-13

## 2015-03-28 NOTE — Patient Instructions (Signed)
1) Letter execise, move 45 degrees to each side x 15 repetitions (2 sets)  Neck Diagonal - Arms at Sides    Standing or sitting facing forward, arms at sides, turn head halfway to one side and move head diagonally to look towards your shoulder.  Hold each position __20__ seconds. Repeat on other side. Do __3__ repetitions.   http://bt.exer.us/254   Copyright  VHI. All rights reserved.  Active Neck Rotation    With head in a comfortable position and chin gently tucked in, rotate head to the right. Hold __3__ seconds. Repeat to the left. Repeat _5___ times. Do __2__ sessions per day.  http://gt2.exer.us/10   Copyright  VHI. All rights reserved.

## 2015-04-04 ENCOUNTER — Ambulatory Visit: Payer: Medicare Other | Attending: Otolaryngology | Admitting: Rehabilitative and Restorative Service Providers"

## 2015-04-04 ENCOUNTER — Encounter: Payer: Self-pay | Admitting: Neurology

## 2015-04-04 DIAGNOSIS — R42 Dizziness and giddiness: Secondary | ICD-10-CM | POA: Diagnosis present

## 2015-04-04 DIAGNOSIS — R269 Unspecified abnormalities of gait and mobility: Secondary | ICD-10-CM | POA: Diagnosis present

## 2015-04-04 NOTE — Patient Instructions (Signed)
TREADMILL TRAINING:  In home, use safety cord and side rails for support. Begin 5 minutes and add 2-3 minutes per week. Begin at 1.5 mph and slowly add speed to tolerance.  Only change one factor per day (either speed or time on the treadmill)

## 2015-04-05 NOTE — Therapy (Signed)
[facility] [address] [place], [place], [address] Phone: [phone]   Fax:  [phone]  Physical Therapy Treatment  Patient Details  Name: [name] MRN: [id number] Date of Birth: 06/27/1940 Referring Provider: Dr. Cresenciano Lick  Encounter Date: 04/04/2015      PT End of Session - 04/04/15 1159    Visit Number 4   Number of Visits 12   Date for PT Re-Evaluation 04/30/15   Authorization Type G code every 10th visit   PT Start Time 1150   PT Stop Time 1230   PT Time Calculation (min) 40 min   Activity Tolerance Patient tolerated treatment well   Behavior During Therapy [facility] for tasks assessed/performed      Past Medical History  Diagnosis Date  . Chest pain     Myocardial infarction ruled out  . Hyperlipidemia   . Ulcerative proctitis   . Hypertensive cardiovascular disease   . Paroxysmal atrial flutter     History of Paroxysmal atrial flutter   . Scarlet fever 1945  . Atrial fibrillation 1962  . Esophageal reflux   . HTN (hypertension)   . Leg pain, bilateral     Past Surgical History  Procedure Laterality Date  . Cardiac catheterization  03/03/2008    Left heart cardiac catheterization and coronary  . Knee arthroscopy  1991, 1995  . Ankle reconstruction Right 2007    [place]  . Lumbar laminectomy  12/2010  . Dex insection Left 12/2012    ear  . Cardiac catheterization  1997    Dr Wynonia Lawman    There were no vitals filed for this visit.  Visit Diagnosis:  Abnormality of gait  Dizziness and giddiness      Subjective Assessment - 04/04/15 1153    Subjective The patient reports that he is unsteady after sitting for long periods of time.  If sitting for minutes, he does not experience the same sensation.  He describes it as unsteady sensation (similar to what was provoked during sensory organization testing).   Currently in Pain? No/denies            [facility] PT Assessment - 04/04/15 1218    Standardized Balance Assessment   Standardized Balance Assessment Berg Balance Test   Berg Balance Test   Sit to Stand Able to stand without using hands and stabilize independently   Standing Unsupported Able to stand safely 2 minutes   Sitting with Back Unsupported but Feet Supported on Floor or Stool Able to sit safely and securely 2 minutes   Stand to Sit Sits safely with minimal use of hands   Transfers Able to transfer safely, minor use of hands   Standing Unsupported with Eyes Closed Able to stand 10 seconds safely   Standing Ubsupported with Feet Together Able to place feet together independently and stand 1 minute safely   From Standing, Reach Forward with Outstretched Arm Can reach confidently >25 cm (10")   From Standing Position, Pick up Object from Floor Able to pick up shoe safely and easily   From Standing Position, Turn to Look Behind Over each Shoulder Looks behind from both sides and weight shifts well   Turn 360 Degrees Able to turn 360 degrees safely in 4 seconds or less   Standing Unsupported, Alternately Place Feet on Step/Stool Able to stand independently and safely and complete 8 steps in 20 seconds   Standing Unsupported, One Foot in [place] to place foot tandem independently and hold 30  seconds   Standing on One Leg Tries to lift leg/unable to hold 3 seconds but remains standing independently   Total Score 53   Berg comment: 53/56            Vestibular Assessment - 04/04/15 1205    Orthostatics   BP supine (x 5 minutes) 119/81 mmHg   HR supine (x 5 minutes) 58   BP standing (after 1 minute) 117/85 mmHg  patient reports 3/10 lightheadedness   HR standing (after 1 minute) 77   BP standing (after 3 minutes) 138/85 mmHg   HR standing (after 3 minutes) 62   Orthostatics Comment can be as bad as 6/10 at home when sitting for long periods     Gait: 70M=4.17 ft/sec Walked on treadmill for 5 minutes at 1.4 mph with supervision to assess if this exercise was  appropriate for home. Pt performed activity well and demonstrated understanding and confidence. He is very motivated to get back into a walking program.    SELF CARE/HOME MANAGEMENT: Gave information about ACT program.  Treadmill training: Begin by using handrails and safety cord. Walk for 5 minutes at 1.5 mph. Progress either time or speed as tolerated (one or the other) and increase time 2-3 minutes per week.           PT Education - 04/05/15 1116    Education provided Yes   Education Details home progression of treadmill walking for safety   Person(s) Educated Patient   Methods Explanation;Handout   Comprehension Verbalized understanding;Returned demonstration          PT Short Term Goals - 04/04/15 1215    PT SHORT TERM GOAL #1   Title The patient will be indep with HEP for balance, gait and general adaptation exercises   Baseline Met on 03/24/2015   Time 4   Period Weeks   Status Achieved   PT SHORT TERM GOAL #2   Title The patient will improve gait speed from 2.89 ft/sec to > or equal to 3.4 ft/sec to demo improvement in functional mobility.   Baseline Met scoring 4.17 ft/sec   Time 4   Period Weeks   Status Achieved   PT SHORT TERM GOAL #3   Title The patient will improve balance demonstrating tandem stance without UE support x 10 seconds.   Baseline Target date 04/16/2015   Time 4   Period Weeks   Status Achieved   PT SHORT TERM GOAL #4   Title The patient will perform 360 degree turn without loss of balance in <6 seconds to each direction.   Baseline Target date 04/16/2015   Time 4   Period Weeks   Status Achieved   PT SHORT TERM GOAL #5   Title The patient will perform home walking program with written instruction on progression.   Baseline Met on 04/05/2015 with instruction on beginning treadmill training, with slow progression.   Time 4   Period Weeks   Status Achieved           PT Long Term Goals - 04/05/15 1119    PT LONG TERM GOAL #1    Title The patient will verbalize understanding of community exercise program for post d/c.   Baseline Target date 11/27/206   Time 6   Period Weeks   PT LONG TERM GOAL #2   Title The patient will improve dizziness handicap index from 72% to < or equal to 50% to demo decreased self perception of balance.   Baseline Target  date 04/30/2015   Time 6   Period Weeks   PT LONG TERM GOAL #3   Title The patient will improve Berg balance score from 46/56 to > or equal to 52/56.   Baseline Pt scores 53/56 on 04/05/2015   Time 6   Period Weeks   Status Achieved               Plan - 04/05/15 1117    Clinical Impression Statement Patient is continue to progress toward goals. Today's session was focused on reassessing short term goals. He fulfilled three short term goals today including: increase gait speed, tandem stance x10 sec, and the 360 degree turns <6 sec. He also met one long term goal by improving his Berg balance score to 53/56.    PT Next Visit Plan Check HEP, discuss community ACT program/silver sneakers; d/c next visit   Consulted and Agree with Plan of Care Patient        Problem List Patient Active Problem List   Diagnosis Date Noted  . Restless leg syndrome, uncontrolled 08/31/2014  . Restless leg syndrome 04/19/2014  . Leg pain, bilateral   . Normal coronary arteries 02/01/2014  . CKD (chronic kidney disease), stage III 07/05/2013  . Dysphagia 07/05/2013  . Esophageal reflux 07/05/2013  . Hearing difficulty 07/05/2013  . Hypertension 07/05/2013  . Impaired fasting glucose 07/05/2013  . Insomnia 07/05/2013  . Spinal stenosis 07/05/2013  . Major depressive disorder, recurrent episode ([place]) 07/05/2013  . Osteoarthrosis, unspecified whether generalized or localized, unspecified site 07/05/2013  . Tachyarrhythmia 07/05/2013  . 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[place]) 07/05/2013  . Mixed hyperlipidemia 05/24/2013    Tycen Dockter, PT 04/05/2015, 11:23 AM  [facility] [address] [place], [place], [address] Phone: [phone]   Fax:  [phone]  Name: [name] MRN: [id number] Date of Birth: 1941/02/26

## 2015-04-07 ENCOUNTER — Encounter: Payer: Medicare Other | Admitting: Rehabilitative and Restorative Service Providers"

## 2015-04-10 ENCOUNTER — Encounter: Payer: Self-pay | Admitting: Neurology

## 2015-04-10 ENCOUNTER — Other Ambulatory Visit: Payer: Self-pay

## 2015-04-10 ENCOUNTER — Encounter: Payer: Medicare Other | Admitting: Rehabilitative and Restorative Service Providers"

## 2015-04-10 ENCOUNTER — Telehealth: Payer: Self-pay | Admitting: *Deleted

## 2015-04-10 MED ORDER — PREGABALIN 50 MG PO CAPS: 200.0000 mg | ORAL_CAPSULE | Freq: Every day | ORAL | Status: DC

## 2015-04-10 NOTE — Telephone Encounter (Signed)
Updated Rx request entered, forwarded to provider for approval.

## 2015-04-10 NOTE — Telephone Encounter (Signed)
Rx for Lyrica faxed and confirmed to pharmacy at [phone].

## 2015-04-17 ENCOUNTER — Encounter: Payer: Medicare Other | Admitting: Rehabilitative and Restorative Service Providers"

## 2015-04-19 ENCOUNTER — Ambulatory Visit: Payer: Medicare Other | Admitting: Rehabilitative and Restorative Service Providers"

## 2015-04-19 ENCOUNTER — Encounter: Payer: Self-pay | Admitting: Rehabilitative and Restorative Service Providers"

## 2015-04-19 DIAGNOSIS — R269 Unspecified abnormalities of gait and mobility: Secondary | ICD-10-CM | POA: Diagnosis not present

## 2015-04-19 DIAGNOSIS — R42 Dizziness and giddiness: Secondary | ICD-10-CM

## 2015-04-19 NOTE — Therapy (Signed)
[facility] [address] [place], [place], [address] Phone: [phone]   Fax:  [phone]  Patient Details  Name: [name] MRN: [id number] Date of Birth: 17-May-1941 Referring Provider:  No ref. provider found  Encounter Date: 04/19/2015  PHYSICAL THERAPY DISCHARGE SUMMARY  Visits from Start of Care: 5  Current functional level related to goals / functional outcomes:     PT Short Term Goals - 04/04/15 1215    PT SHORT TERM GOAL #1   Title The patient will be indep with HEP for balance, gait and general adaptation exercises   Baseline Met on 03/24/2015   Time 4   Period Weeks   Status Achieved   PT SHORT TERM GOAL #2   Title The patient will improve gait speed from 2.89 ft/sec to > or equal to 3.4 ft/sec to demo improvement in functional mobility.   Baseline Met scoring 4.17 ft/sec   Time 4   Period Weeks   Status Achieved   PT SHORT TERM GOAL #3   Title The patient will improve balance demonstrating tandem stance without UE support x 10 seconds.   Baseline Target date 04/16/2015   Time 4   Period Weeks   Status Achieved   PT SHORT TERM GOAL #4   Title The patient will perform 360 degree turn without loss of balance in <6 seconds to each direction.   Baseline Target date 04/16/2015   Time 4   Period Weeks   Status Achieved   PT SHORT TERM GOAL #5   Title The patient will perform home walking program with written instruction on progression.   Baseline Met on 04/05/2015 with instruction on beginning treadmill training, with slow progression.   Time 4   Period Weeks   Status Achieved         PT Long Term Goals - 04/19/15 1048    PT LONG TERM GOAL #1   Title The patient will verbalize understanding of community exercise program for post d/c.   Baseline Patient attending ACT by Dedra Skeens   Time 6   Period Weeks   Status Achieved   PT LONG TERM GOAL #2   Title The patient will improve dizziness  handicap index from 72% to < or equal to 50% to demo decreased self perception of balance.   Baseline Patient improved from 72% to 20% at today's visit.   Time 6   Period Weeks   Status Achieved   PT LONG TERM GOAL #3   Title The patient will improve Berg balance score from 46/56 to > or equal to 52/56.   Baseline Pt scores 53/56 on 04/05/2015   Time 6   Period Weeks   Status Achieved         Plan - 04/19/15 1313    Clinical Impression Statement The patient met 3/3 LTGs and all STGs.  PT discharging with instruction to continue HEP and community wellness program.   PT Next Visit Plan Discharge today- patient requests notes routed to his referring MD, primary MD, and neurologist.   Consulted and Agree with Plan of Care Patient      Remaining deficits: H/o intermittent vertigo, no recent complaints. Decreased high level balance activities.   Education / Equipment: Home exercise program for balance and gaze adaptation, community wellness for post d/c.  Plan: Patient agrees to discharge.  Patient goals were met. Patient is being discharged due to meeting the stated rehab goals.  ?????  Thank you for the referral of this patient. Rudell Cobb, MPT  [facility] 04/19/2015, 7:03 PM  [facility] [address] [place], [place], [address] Phone: [phone]   Fax:  [phone]

## 2015-04-19 NOTE — Therapy (Signed)
[facility] [address] [place], [place], [address] Phone: [phone]   Fax:  [phone]  Physical Therapy Treatment  Patient Details  Name: [name] MRN: [id number] Date of Birth: 1940/12/18 Referring Provider: Dr. Cresenciano Lick  Encounter Date: 04/19/2015      PT End of Session - 04/19/15 1048    Visit Number 5   Number of Visits 12   Date for PT Re-Evaluation 04/30/15   Authorization Type G code every 10th visit   PT Start Time 1018   PT Stop Time 1100   PT Time Calculation (min) 42 min   Activity Tolerance Patient tolerated treatment well   Behavior During Therapy [facility] for tasks assessed/performed      Past Medical History  Diagnosis Date  . Chest pain     Myocardial infarction ruled out  . Hyperlipidemia   . Ulcerative proctitis   . Hypertensive cardiovascular disease   . Paroxysmal atrial flutter     History of Paroxysmal atrial flutter   . Scarlet fever 1945  . Atrial fibrillation 1962  . Esophageal reflux   . HTN (hypertension)   . Leg pain, bilateral     Past Surgical History  Procedure Laterality Date  . Cardiac catheterization  03/03/2008    Left heart cardiac catheterization and coronary  . Knee arthroscopy  1991, 1995  . Ankle reconstruction Right 2007    [place]  . Lumbar laminectomy  12/2010  . Dex insection Left 12/2012    ear  . Cardiac catheterization  1997    Dr Wynonia Lawman    There were no vitals filed for this visit.  Visit Diagnosis:  Abnormality of gait  Dizziness and giddiness      Subjective Assessment - 04/19/15 1024    Subjective The patient tried [facility] and feels that he will be able to exercise there for long term.    The patient reports he tried the treadmill and it hurt the right knee.     Patient Stated Goals "I don't know if therapy is what I need"   Currently in Pain? No/denies     NEUROMUSCULAR RE-EDUCATION: Reviewed prior HEP including: Gaze x 1  adaptation with verbal cues for patient to perform at smaller ROM and faster pace.  He feels limited due to neck tightness.  We discussed performing neck stretches working on improving ROM, but then to use gaze exercise for faster paced activities within tolerable ROM for cervical spine.  Corner balance activities including foam with eyes closed and tandem with eyes open  Habituation- turns x 360 degrees R and L sides performed 3 reps each today with minimal unsteadiness noted  Sensory Organization Testing=72% compared to age/height normative value of 65%.   Patient demonstrated WNLs use of somatosensory feedback for balance, WNLs use of visual feedback for balance, and WNLs use of vestibular feedback for balance.  *Patient improved from initial score of 60% with moderately diminished use of vestibular inputs.  SELF CARE/HOME MANAGEMENT: PT and patient discussed continuation of community exercise after d/c.  The patient was eligible for silver sneakers, however has tried [employer] and had increased pain.  PT recommended a smaller setting where personal trainers available at [facility].  He has been attending 2x/week for the past 2 weeks and feels well supported in that environment for post therapy activities.   DHI=20%       PT Education - 04/19/15 1312    Education provided Yes  Education Details Reviewed all HEP and recommended the patient continue on days he does not participate in community exercise          PT Short Term Goals - 04/04/15 1215    PT SHORT TERM GOAL #1   Title The patient will be indep with HEP for balance, gait and general adaptation exercises   Baseline Met on 03/24/2015   Time 4   Period Weeks   Status Achieved   PT SHORT TERM GOAL #2   Title The patient will improve gait speed from 2.89 ft/sec to > or equal to 3.4 ft/sec to demo improvement in functional mobility.   Baseline Met scoring 4.17 ft/sec   Time 4   Period Weeks   Status Achieved   PT  SHORT TERM GOAL #3   Title The patient will improve balance demonstrating tandem stance without UE support x 10 seconds.   Baseline Target date 04/16/2015   Time 4   Period Weeks   Status Achieved   PT SHORT TERM GOAL #4   Title The patient will perform 360 degree turn without loss of balance in <6 seconds to each direction.   Baseline Target date 04/16/2015   Time 4   Period Weeks   Status Achieved   PT SHORT TERM GOAL #5   Title The patient will perform home walking program with written instruction on progression.   Baseline Met on 04/05/2015 with instruction on beginning treadmill training, with slow progression.   Time 4   Period Weeks   Status Achieved           PT Long Term Goals - May 14, 2015 1048    PT LONG TERM GOAL #1   Title The patient will verbalize understanding of community exercise program for post d/c.   Baseline Patient attending [facility]   Time 6   Period Weeks   Status Achieved   PT LONG TERM GOAL #2   Title The patient will improve dizziness handicap index from 72% to < or equal to 50% to demo decreased self perception of balance.   Baseline Patient improved from 72% to 20% at today's visit.   Time 6   Period Weeks   Status Achieved   PT LONG TERM GOAL #3   Title The patient will improve Berg balance score from 46/56 to > or equal to 52/56.   Baseline Pt scores 53/56 on 04/05/2015   Time 6   Period Weeks   Status Achieved               Plan - May 14, 2015 1313    Clinical Impression Statement The patient met 3/3 LTGs and all STGs.  PT discharging with instruction to continue HEP and community wellness program.   PT Next Visit Plan Discharge today- patient requests notes routed to his referring MD, primary MD, and neurologist.   Consulted and Agree with Plan of Care Patient         G-Codes - 05-14-2015 1900    Functional Assessment Tool Used Berg=53/56.   Functional Limitation Mobility: Walking and moving around   Mobility: Walking and  Moving Around Goal Status [phone]) At least 20 percent but less than 40 percent impaired, limited or restricted   Mobility: Walking and Moving Around Discharge Status [phone]) At least 1 percent but less than 20 percent impaired, limited or restricted        Problem List Patient Active Problem List   Diagnosis Date Noted  . Restless leg syndrome,  uncontrolled 08/31/2014  . Restless leg syndrome 04/19/2014  . Leg pain, bilateral   . Normal coronary arteries 02/01/2014  . CKD (chronic kidney disease), stage III 07/05/2013  . Dysphagia 07/05/2013  . Esophageal reflux 07/05/2013  . Hearing difficulty 07/05/2013  . Hypertension 07/05/2013  . Impaired fasting glucose 07/05/2013  . Insomnia 07/05/2013  . Spinal stenosis 07/05/2013  . Major depressive disorder, recurrent episode ([place]) 07/05/2013  . Osteoarthrosis, unspecified whether generalized or localized, unspecified site 07/05/2013  . Tachyarrhythmia 07/05/2013  . 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[place]) 07/05/2013  . Mixed hyperlipidemia 05/24/2013    Esmeralda Malay, PT 04/19/2015, 1:14 PM  [facility] [address] [place], [place], [address] Phone: [phone]   Fax:  [phone]  Name: [name] MRN: [id number] Date of Birth: [birth year]

## 2015-05-04 ENCOUNTER — Other Ambulatory Visit: Payer: Self-pay | Admitting: Neurology

## 2015-05-10 ENCOUNTER — Other Ambulatory Visit (INDEPENDENT_AMBULATORY_CARE_PROVIDER_SITE_OTHER): Payer: Medicare Other | Admitting: *Deleted

## 2015-05-10 DIAGNOSIS — E782 Mixed hyperlipidemia: Secondary | ICD-10-CM | POA: Diagnosis not present

## 2015-05-10 DIAGNOSIS — I10 Essential (primary) hypertension: Secondary | ICD-10-CM | POA: Diagnosis not present

## 2015-05-10 LAB — HEPATIC FUNCTION PANEL
ALK PHOS: 71 U/L (ref 40–115)
ALT: 23 U/L (ref 9–46)
AST: 26 U/L (ref 10–35)
Albumin: 4.3 g/dL (ref 3.6–5.1)
BILIRUBIN DIRECT: 0.1 mg/dL (ref <=0.2)
Indirect Bilirubin: 0.5 mg/dL (ref 0.2–1.2)
Total Bilirubin: 0.6 mg/dL (ref 0.2–1.2)
Total Protein: 6.6 g/dL (ref 6.1–8.1)

## 2015-05-10 LAB — LIPID PANEL
CHOL/HDL RATIO: 5.2 ratio — AB (ref <=5.0)
Cholesterol: 120 mg/dL — ABNORMAL LOW (ref 125–200)
HDL: 23 mg/dL — ABNORMAL LOW (ref >=40)
LDL Cholesterol: 63 mg/dL (ref <130)
Triglycerides: 172 mg/dL — ABNORMAL HIGH (ref <150)
VLDL: 34 mg/dL — ABNORMAL HIGH (ref <30)

## 2015-05-10 NOTE — Addendum Note (Signed)
Addended by: Eulis Foster on: 05/10/2015 07:49 AM   Modules accepted: Orders

## 2015-05-10 NOTE — Addendum Note (Signed)
Addended by: Eulis Foster on: 05/10/2015 07:48 AM   Modules accepted: Orders

## 2015-05-10 NOTE — Addendum Note (Signed)
Addended by: Eulis Foster on: 05/10/2015 08:05 AM   Modules accepted: Orders

## 2015-05-13 ENCOUNTER — Other Ambulatory Visit: Payer: Self-pay | Admitting: Neurology

## 2015-07-10 ENCOUNTER — Ambulatory Visit (INDEPENDENT_AMBULATORY_CARE_PROVIDER_SITE_OTHER): Payer: Medicare Other | Admitting: Adult Health

## 2015-07-10 ENCOUNTER — Encounter: Payer: Self-pay | Admitting: Adult Health

## 2015-07-10 VITALS — BP 138/84 | HR 68 | Ht 70.0 in | Wt 260.0 lb

## 2015-07-10 DIAGNOSIS — E611 Iron deficiency: Secondary | ICD-10-CM | POA: Diagnosis not present

## 2015-07-10 DIAGNOSIS — G2581 Restless legs syndrome: Secondary | ICD-10-CM

## 2015-07-10 MED ORDER — FERROUS FUM-IRON POLYSACCH-FA 162-115.2-1 MG PO CAPS: 1.0000 | ORAL_CAPSULE | Freq: Every day | ORAL | Status: DC

## 2015-07-10 NOTE — Progress Notes (Signed)
I have reviewed and agreed above plan. 

## 2015-07-10 NOTE — Patient Instructions (Signed)
Continue Klonopin 0.25 mg nightly, lyrica and iron supplement We will check blood work today If your symptoms worsen or you develop new symptoms please let us know.

## 2015-07-10 NOTE — Progress Notes (Signed)
PATIENT: [name] DOB: 1940/12/29  REASON FOR VISIT: follow up- RLS HISTORY FROM: patient  HISTORY OF PRESENT ILLNESS: [name] is a  75 year old male with a history of restless leg syndrome. He returns today for follow-up. The patient states that since he began taking Klonopin , Lyrica and the iron supplement his symptoms have drastically improved. He states that he typically takes a hot bath before bed and this has also helped his symptoms. He does not have any of the symptoms during the day. He states that occasionally he'll have the achy feeling in his legs but nothing compared to what it was. He states that he is now sleeping well. He states that he did decrease the Klonopin after having an episode of urinary incontinence during the night. He states that since he decreased the Klonopin he has not had this happen again. He denies any new neurological symptoms. He returns today for an evaluation.  HISTORY: [name] is a 75 year old male with a history of restless leg syndrome. He returns today for follow-up. The patient was just recently seen earlier this month for his restless legs syndrome. Dr. [name] started him on Klonopin. The patient states that he cannot tolerate this medication it caused him to be confused and affected his balance. He has stopped this medication. The patient states that the pain in both his legs have increased. He was taking hydrocodone 1-2 tablets at bedtime and that helps some. He states that he has burning and tingling that travels up and down the entire leg bilaterally. He will sometimes have swelling in the right ankle which he contributes to a prior injury. He states that he will have to use ice packs or a warm bath to control his discomfort. He also states that putting his bare feet on ice does calm down the symptoms. In the past he saw Dr. Jacelyn Grip and has received spinal injections. Patient states that Dr. [name] asked him to hold off on this for now. The patient  states that he has had nerve conduction studies in the past that was unremarkable. The patient returns today for an evaluation.   HISTORY 08/19/14 [name]): [name] is a 75 yo RH male, accompanied by his wife, referred by Dr. Jacelyn Grip, and his primary care physician Dr. Leonides Schanz for evaluation of chronic low back pain, bilateral lower extremity muscle ache  He had a past medical history of hypertension, hyperlipidemia, presenting with chronic low back pain, bilateral lower extremity muscle achy pain since 2010.   Initially it was thought due to his statin treatment, he has tried different statin without improving his symptoms,over the years, he also received multiple lumbar bilateral facet joint fluoroscopy guided injection without improving his symptoms.   In 2012, he was diagnosed with L4 and 5 lumbar stenosis, had lumbar laminectomy L3-4, L4-L5 by Dr. [name] in July 2012,without improving his symptoms,  He was seen by different specialists, tried different medications, this including Elavil, Xanax as needed, he is currently taking hydrocodone 5/325 mg 1-2 tablets every night, Xanax 0.25 milligram as needed, Flexeril 10 mg every night, gabapentin 600 mg every night without significant improvement,  During the daytime, he denies significant low back pain, or bilateral lower extremity pain, he does has history of right ankle fracture, mild gait difficulty due to right ankle pain, but starting at evening time, he began to noticed deep achy pain starting from midline low back, radiating along bilateral lateral leg, constant, he tends to pace around,  has difficulty sleeping at nighttime, sometimes he has to got up take a hot bath couple times every night, put ice on his back, on his feet, only provide temporary relief.  He has also tried acupuncture, massage, TENS unit, without significant improvement, previously, he has a short trial of low-dose Requip, without improving his symptoms, no significant side  effect noticed either.  He has bilateral lateral 3 toes numbness, no weakness, no bilateral fingertips numbness, or weakness  Per record, electrodiagnostic study failed to demonstrate significant etiology,  We have reviewed MRI lumbar in 2012, there was evidence of L4-5 spinal stenosis, and the most recent MRI Lumbar was in June 2014,progression of multilevel facet arthropathy, status post a laminectomy at L3-4, with residual large synovial cyst extending along the posterior epidural space without significant central canal stenosis, mild foraminal narrowing bilaterally at L3 and 4, progression of mild lateral recess narrowing bilaterally at L4-5, foraminal narrowing is stable at this level  MRI of the brain in June 2014 showed mild atrophy, no acute intracranial abnormality  UPDATE Dec 15th 2015:  He is tolerating gabapentin 300 mg 2 tablets every night, Requip 1 mg 2 tablets every night, he can sleep better with the medications, but continue complains of bilateral lower extremity dull achy pain, numbness, urgency to move, especially at nighttime, He also complains of slow progressive gait difficulty, grasps things for support, he has urinary urgency, but no incontinence.  UPDATE Jan 29th 2016: He is now taking gabapentin 300mg  2 tabs qhs and requip 1mg , 3 tabs qhs, which has been very helpful, 30%, improvement, he still has bilatarel leg pain at evening, stretching,massage, He denies bowel and bladder incontinence , he has tinlging at the soles of his feet.   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  He continue complaining significant difficulty at nighttime, came in with a full  page list of symptoms, in the morning time, he denies significant back pain, no low back pain, symptoms started in the late afternoon, gradually building up, around 9: to10 PM, he noticed achy feeling down lateral side of both leg, increased by sitting still, intense urge to lie on the floor, stretching muscle backwards, late night hot soaking bath to help him sleep, he has to take about 3 AM last night, difficulty sleeping, excessive fatigue during the daytime, could not sit through movies, standing up in the aisle 2/3 of the movie He is now taking, hydrocodone 5/325mg  2 tabs 9pm, advil 200mg  2 tab   He has quit taking Requip 1 mg, up to 4 tablets every night, also gabapentin, complains of upset stomach, without relieving his symptoms, he is now taking Advil 200 mg 2 tablets as needed, hydrocodone/Tylenol 5/325 mg 2 tablets every night for symptoms control, which only mild, temporarily, Tylenol helps some too.  UPDATE April 29th 2016: Laboratory showed low iron, ferritin level at 9, he was put on iron supplement, his restless leg symptoms has much improved, He is now taking clonazepam 0.5 milligram every night, Lyrica 50 mg every night, he can sleep much better,  He continue, combat right ankle pain  UPDATE August 2nd 2016: He sleeps well, he is now taking clonazepam 0.5mg  qhs, Lyrica 50mg  3 tabs qhs, no  longer on Hydrocodone, he complains of side effect,  REVIEW OF SYSTEMS: Out of a complete 14 system review of symptoms, the patient complains only of the following symptoms, and all other reviewed systems are negative.   insomnia, muscle cramps  ALLERGIES: Allergies  Allergen Reactions  . Atorvastatin   . Compazine [Prochlorperazine Edisylate]   . Gemfibrozil   . Monascus Purpureus Went Yeast   . Penicillins   . Rosuvastatin     Failed Crestor tiw due to leg aches (tolerated Crestor qweek and biw)  . Welchol [Colesevelam Hcl]     Leg aches    HOME MEDICATIONS: Outpatient  Prescriptions Prior to Visit  Medication Sig Dispense Refill  . ACETAMINOPHEN PO Take 200 mg by mouth every 6 (six) hours as needed.    . Ascorbic Acid (VITAMIN C PO) Take 500 mg by mouth daily.     Marland Kitchen aspirin 81 MG tablet Take 81 mg by mouth daily.    . carvedilol (COREG) 3.125 MG tablet Take 3.125 mg by mouth 2 (two) times daily with a meal.    . clonazePAM (KLONOPIN) 0.5 MG tablet Take 0.5-1 tablets (0.25-0.5 mg total) by mouth at bedtime as needed. 30 tablet 3  . COENZYME Q-10 PO Take 1,000 mg by mouth daily.     Marland Kitchen ezetimibe (ZETIA) 10 MG tablet Take 1 tablet (10 mg total) by mouth daily. 30 tablet 6  . Ferrous Fum-Iron Polysacch-FA 162-115.2-1 MG CAPS Take 1 tablet by mouth daily. 90 each 3  . lisinopril (PRINIVIL,ZESTRIL) 2.5 MG tablet Take 1 tablet (2.5 mg total) by mouth daily. 90 tablet 3  . omeprazole (PRILOSEC OTC) 20 MG tablet Take 20 mg by mouth daily.    . rosuvastatin (CRESTOR) 10 MG tablet Take 1 tablet by mouth every day or as directed (Patient taking differently: Take 10 mg by mouth. Take 1 tablet by mouth three times each week.) 90 tablet 1  . sertraline (ZOLOFT) 50 MG tablet Take 50 mg by mouth daily. Taking three times weekly.    . pregabalin (LYRICA) 50 MG capsule Take 4 capsules (200 mg total) by mouth at bedtime. 120 capsule 3   No facility-administered medications prior to visit.    PAST MEDICAL HISTORY: Past Medical History  Diagnosis Date  . Chest pain     Myocardial infarction ruled out  . Hyperlipidemia   . Ulcerative proctitis ([place])   . Hypertensive cardiovascular disease   . Paroxysmal atrial flutter ([facility])     History of Paroxysmal atrial flutter   . Scarlet fever 1945  . Atrial fibrillation ([place]) 1962  . Esophageal reflux   . HTN (hypertension)   . Leg pain, bilateral     PAST SURGICAL HISTORY: Past Surgical History  Procedure Laterality Date  . Cardiac catheterization  03/03/2008    Left heart cardiac catheterization and coronary  . Knee  arthroscopy  1991, 1995  . Ankle reconstruction Right 2007    [place]  . Lumbar laminectomy  12/2010  . Dex insection Left 12/2012    ear  . Cardiac catheterization  1997    Dr Wynonia Lawman    FAMILY HISTORY: Family History  Problem Relation Age of Onset  . Prostate cancer Father   . Heart attack Brother   . Kidney cancer Brother   . Brain cancer Brother     SOCIAL HISTORY: Social History   Social History  . Marital Status: Married    Spouse Name: Bethena Roys  . Number of Children: 3  .  Years of Education: college   Occupational History  .      Retired    Social History Main Topics  . Smoking status: Former Research scientist (life sciences)  . Smokeless tobacco: Never Used     Comment: quit 1980  . Alcohol Use: 0.0 oz/week    0 Standard drinks or equivalent per week     Comment: 1-3 weekly  . Drug Use: No  . Sexual Activity: Not on file   Other Topics Concern  . Not on file   Social History Narrative   Patient is retired and lives at home with his wife Bethena Roys.    Education college.   Caffeine one cup daily.      PHYSICAL EXAM  Filed Vitals:   07/10/15 1021  BP: 138/84  Pulse: 68  Height: 5\' 10"  (1.778 m)  Weight: 260 lb (117.935 kg)   Body mass index is 37.31 kg/(m^2).  Generalized: Well developed, in no acute distress   Neurological examination  Mentation: Alert oriented to time, place, history taking. Follows all commands speech and language fluent Cranial nerve II-XII: Pupils were equal round reactive to light. Extraocular movements were full, visual field were full on confrontational test. Facial sensation and strength were normal. Uvula tongue midline. Head turning and shoulder shrug  were normal and symmetric. Motor: The motor testing reveals 5 over 5 strength of all 4 extremities. Good symmetric motor tone is noted throughout.  Sensory: Sensory testing is intact to soft touch on all 4 extremities. No evidence of extinction is noted.  Coordination: Cerebellar testing reveals good  finger-nose-finger and heel-to-shin bilaterally.  Gait and station: Gait is normal. Tandem gait is  unsteady. Romberg is negative. No drift is seen.  Reflexes: Deep tendon reflexes are symmetric and normal bilaterally.   DIAGNOSTIC DATA (LABS, IMAGING, TESTING) - I reviewed patient records, labs, notes, testing and imaging myself where available.      Component Value Date/Time   NA 140 10/08/2013 0739   K 4.8 10/08/2013 0739   CL 106 10/08/2013 0739   CO2 28 10/08/2013 0739   GLUCOSE 98 10/08/2013 0739   BUN 27* 10/08/2013 0739   CREATININE 1.5 10/08/2013 0739   CALCIUM 9.0 10/08/2013 0739   PROT 6.6 05/10/2015 0805   ALBUMIN 4.3 05/10/2015 0805   AST 26 05/10/2015 0805   ALT 23 05/10/2015 0805   ALKPHOS 71 05/10/2015 0805   BILITOT 0.6 05/10/2015 0805   GFRNONAA >60 11/26/2010 0817   GFRAA >60 11/26/2010 0817   Lab Results  Component Value Date   CHOL 120* 05/10/2015   HDL 23* 05/10/2015   LDLCALC 63 05/10/2015   TRIG 172* 05/10/2015   CHOLHDL 5.2* 05/10/2015    Iron/TIBC/Ferritin/ %Sat    Component Value Date/Time   IRON 141 01/03/2015 1249   TIBC 300 01/03/2015 1249   FERRITIN 54 01/03/2015 1249   IRONPCTSAT 47 01/03/2015 1249     ASSESSMENT AND PLAN 75 y.o. year old male  has a past medical history of Chest pain; Hyperlipidemia; Ulcerative proctitis ([place] [address]); Hypertensive cardiovascular disease; Paroxysmal atrial flutter ([place]); Scarlet fever (1945); Atrial fibrillation ([place]) (1962); Esophageal reflux; HTN (hypertension); and Leg pain, bilateral. here with:   1. Restless leg syndrome 2. Iron deficiency  Overall the patient is doing well. He will continue on Klonopin 0.25mg ,  Lyrica 100 mg twice a day and iron supplement. I will recheck his iron levels today. Patient advised that if his symptoms worsen or he develops new symptoms he should let us know.  He will follow-up in 6 months or sooner if needed.  Ward Givens, MSN, NP-C 07/10/2015, 10:38 AM [facility] [address], [place], [place] [address] [phone]

## 2015-07-11 ENCOUNTER — Telehealth: Payer: Self-pay | Admitting: Adult Health

## 2015-07-11 LAB — IRON AND TIBC
Iron Saturation: 30 % (ref 15–55)
Iron: 87 ug/dL (ref 38–169)
TIBC: 291 ug/dL (ref 250–450)
UIBC: 204 ug/dL (ref 111–343)

## 2015-07-11 LAB — FERRITIN: FERRITIN: 64 ng/mL (ref 30–400)

## 2015-07-11 NOTE — Telephone Encounter (Signed)
Called and spoke to patient relayed labs were ok. Relayed to patient continue his iron. Patient understood.

## 2015-07-11 NOTE — Telephone Encounter (Signed)
-----   Message from Ward Givens, NP sent at 07/11/2015  9:53 AM EST ----- Lab work ok. Continue iron supplement

## 2015-07-24 ENCOUNTER — Ambulatory Visit (INDEPENDENT_AMBULATORY_CARE_PROVIDER_SITE_OTHER): Payer: Medicare Other | Admitting: Cardiology

## 2015-07-24 ENCOUNTER — Encounter: Payer: Self-pay | Admitting: Cardiology

## 2015-07-24 VITALS — BP 130/70 | HR 74 | Ht 70.0 in | Wt 257.3 lb

## 2015-07-24 DIAGNOSIS — E782 Mixed hyperlipidemia: Secondary | ICD-10-CM | POA: Diagnosis not present

## 2015-07-24 DIAGNOSIS — I10 Essential (primary) hypertension: Secondary | ICD-10-CM | POA: Diagnosis not present

## 2015-07-24 NOTE — Patient Instructions (Signed)
Continue your current therapy  I will see you in 6 months.   

## 2015-07-24 NOTE — Progress Notes (Signed)
[name] Date of Birth: 04/18/1941 Medical Record [id number]  History of Present Illness: [name] is seen for follow up of hyperlipidemia. He has a history of mixed hyperlipidemia, HTN, and obesity.  He has been intolerant to statins due to severe myalgias. This includes lipitor at a dose of 20 mg daily and Crestor 10 mg 3 days a week. Welchol apparently made no change in his lipid levels. Niacin was associated with severe flushing.   He has no history of vascular disease. He had normal cardiac caths in 1998 and 2009. No history of PAD or CVA/TIA.  He apparently had afib in 1962 but no documented recurrence.  Since his last visit with me he has been followed in the lipid clinic. He is now on crestor 4 days a week and Zetia. He had significant improvement in lipid levels.  His restless leg syndrome has improved on iron and lyrica. Sleeping much better now.   Current Outpatient Prescriptions on File Prior to Visit  Medication Sig Dispense Refill  . ACETAMINOPHEN PO Take 200 mg by mouth every 6 (six) hours as needed.    . Ascorbic Acid (VITAMIN C PO) Take 500 mg by mouth daily.     Marland Kitchen aspirin 81 MG tablet Take 81 mg by mouth daily.    . carvedilol (COREG) 3.125 MG tablet Take 3.125 mg by mouth 2 (two) times daily with a meal.    . clonazePAM (KLONOPIN) 0.5 MG tablet Take 0.5-1 tablets (0.25-0.5 mg total) by mouth at bedtime as needed. 30 tablet 3  . COENZYME Q-10 PO Take 1,000 mg by mouth daily.     Marland Kitchen ezetimibe (ZETIA) 10 MG tablet Take 1 tablet (10 mg total) by mouth daily. 30 tablet 6  . Ferrous Fum-Iron Polysacch-FA 162-115.2-1 MG CAPS Take 1 tablet by mouth daily. 90 each 3  . lisinopril (PRINIVIL,ZESTRIL) 2.5 MG tablet Take 1 tablet (2.5 mg total) by mouth daily. 90 tablet 3  . LYRICA 100 MG capsule Take 100 mg by mouth 2 (two) times daily.     Marland Kitchen omeprazole (PRILOSEC OTC) 20 MG tablet Take 20 mg by mouth daily.    . rosuvastatin (CRESTOR) 10 MG tablet Take 1 tablet by mouth every day  or as directed (Patient taking differently: Take 10 mg by mouth. Take 1 tablet by mouth four times each week.) 90 tablet 1  . sertraline (ZOLOFT) 50 MG tablet Take 50 mg by mouth daily.      No current facility-administered medications on file prior to visit.    Allergies  Allergen Reactions  . Atorvastatin   . Compazine [Prochlorperazine Edisylate]   . Gemfibrozil   . Monascus Purpureus Went Yeast   . Penicillins   . Rosuvastatin     Failed Crestor tiw due to leg aches (tolerated Crestor qweek and biw)  . Welchol [Colesevelam Hcl]     Leg aches    Past Medical History  Diagnosis Date  . Chest pain     Myocardial infarction ruled out  . Hyperlipidemia   . Ulcerative proctitis ([place])   . Hypertensive cardiovascular disease   . Paroxysmal atrial flutter (HCC)     History of Paroxysmal atrial flutter   . Scarlet fever 1945  . Atrial fibrillation ([place]) 1962  . Esophageal reflux   . HTN (hypertension)   . Leg pain, bilateral     Past Surgical History  Procedure Laterality Date  . Cardiac catheterization  03/03/2008    Left heart cardiac catheterization  and coronary  . Knee arthroscopy  1991, 1995  . Ankle reconstruction Right 2007    [place]  . Lumbar laminectomy  12/2010  . Dex insection Left 12/2012    ear  . Cardiac catheterization  1997    Dr Wynonia Lawman    History  Smoking status  . Former Smoker  Smokeless tobacco  . Never Used    Comment: quit 1980    History  Alcohol Use  . 0.0 oz/week  . 0 Standard drinks or equivalent per week    Comment: 1-3 weekly    Family History  Problem Relation Age of Onset  . Prostate cancer Father   . Heart attack Brother   . Kidney cancer Brother   . Brain cancer Brother     Review of Systems: As noted in HPI.  All other systems were reviewed and are negative.  Physical Exam: BP 130/70 mmHg  Pulse 74  Ht 5\' 10"  (1.778 m)  Wt 116.711 kg (257 lb 4.8 oz)  BMI 36.92 kg/m2 He is an obese WM in NAD.  HEENT: [place]/AT,  PERRL, EOMI. Sclera are clear. Oropharynx is clear.  Neck: no JVD, bruits, thyromegaly, or adenopathy. Lungs: clear CV: RRR, normal S1-2, no gallop, murmur,click Abdomen: obese, soft, NT, no masses, bruits, or HSM Ext: no cyanosis or edema. Pulses are 2+. Skin: warm and dry, no xanthalaismas or tendon xanthomas. Neuro: alert and oriented x 3. CN II-XII intact  LABORATORY DATA: Lab Results  Component Value Date   WBC 7.6 11/26/2010   HGB 13.7 11/26/2010   HCT 39.5 11/26/2010   PLT 170 11/26/2010   GLUCOSE 98 10/08/2013   CHOL 120* 05/10/2015   TRIG 172* 05/10/2015   HDL 23* 05/10/2015   LDLCALC 63 05/10/2015   ALT 23 05/10/2015   AST 26 05/10/2015   NA 140 10/08/2013   K 4.8 10/08/2013   CL 106 10/08/2013   CREATININE 1.5 10/08/2013   BUN 27* 10/08/2013   CO2 28 10/08/2013   INR 0.9 03/02/2008     Assessment / Plan: 1. Mixed hyperlipidemia. Multiple drug intolerances including statins and niacin. Marked reduction with combination of Zetia and low dose Crestor 4x  week. Continue current therapy. Follow up in 6 months.  2. HTN- controlled. Continue lower dose of lisinopril.   3. Normal cardiac cath 2009.

## 2015-08-03 ENCOUNTER — Other Ambulatory Visit: Payer: Self-pay | Admitting: *Deleted

## 2015-08-03 ENCOUNTER — Other Ambulatory Visit: Payer: Self-pay | Admitting: Cardiology

## 2015-08-03 MED ORDER — LYRICA 100 MG PO CAPS: 100.0000 mg | ORAL_CAPSULE | Freq: Two times a day (BID) | ORAL | Status: DC

## 2015-08-03 NOTE — Telephone Encounter (Signed)
REFILL 

## 2015-11-19 ENCOUNTER — Other Ambulatory Visit: Payer: Self-pay | Admitting: Neurology

## 2015-12-29 ENCOUNTER — Encounter: Payer: Self-pay | Admitting: Nurse Practitioner

## 2015-12-29 ENCOUNTER — Telehealth: Payer: Self-pay | Admitting: Cardiology

## 2016-01-03 ENCOUNTER — Ambulatory Visit (INDEPENDENT_AMBULATORY_CARE_PROVIDER_SITE_OTHER): Payer: Medicare Other | Admitting: Adult Health

## 2016-01-03 ENCOUNTER — Encounter: Payer: Self-pay | Admitting: Adult Health

## 2016-01-03 VITALS — BP 117/71 | HR 68 | Ht 70.0 in | Wt 255.8 lb

## 2016-01-03 DIAGNOSIS — G2581 Restless legs syndrome: Secondary | ICD-10-CM | POA: Diagnosis not present

## 2016-01-03 MED ORDER — LYRICA 100 MG PO CAPS: 100.0000 mg | ORAL_CAPSULE | Freq: Two times a day (BID) | ORAL | 5 refills | Status: DC

## 2016-01-03 NOTE — Progress Notes (Signed)
I have read the note, and I agree with the clinical assessment and plan.  Richard A. Sater, MD, PhD Certified in Neurology, Clinical Neurophysiology, Sleep Medicine, Pain Medicine and Neuroimaging  [facility] [address], [address] [place], [place] [address] [phone]  

## 2016-01-03 NOTE — Progress Notes (Signed)
PATIENT: [name] DOB: 26-Sep-1940  REASON FOR VISIT: follow up- RLS HISTORY FROM: patient  HISTORY OF PRESENT ILLNESS: HISTORY Per Dr. [name] notes:  HISTORY : [name] is a 75 yo RH male, accompanied by his wife, referred by Dr. Jacelyn Grip, and his primary care physician Dr. Leonides Schanz for evaluation of chronic low back pain, bilateral lower extremity muscle ache He had a past medical history of hypertension, hyperlipidemia, presenting with chronic low back pain, bilateral lower extremity muscle achy pain since 2010.  Initially it was thought due to his statin treatment, he has tried different statin without improving his symptoms,over the years, he also received multiple lumbar bilateral facet joint fluoroscopy guided injection without improving his symptoms.  In 2012, he was diagnosed with L4 and 5 lumbar stenosis, had lumbar laminectomy L3-4, L4-L5 by Dr. [name] in July 2012,without improving his symptoms, He was seen by different specialists, tried different medications, this including Elavil, Xanax as needed, he is currently taking hydrocodone 5/325 mg 1-2 tablets every night, Xanax 0.25 milligram as needed, Flexeril 10 mg every night, gabapentin 600 mg every night without significant improvement, During the daytime, he denies significant low back pain, or bilateral lower extremity pain, he does has history of right ankle fracture, mild gait difficulty due to right ankle pain, but starting at evening time, he began to noticed deep achy pain starting from midline low back, radiating along bilateral lateral leg, constant, he tends to pace around, has difficulty sleeping at nighttime, sometimes he has to got up take a hot bath couple times every night, put ice on his back, on his feet, only provide temporary relief. He has also tried acupuncture, massage, TENS unit, without significant improvement, previously, he has a short trial of low-dose Requip, without improving his symptoms, no  significant side effect noticed either. He has bilateral lateral 3 toes numbness, no weakness, no bilateral fingertips numbness, or weakness Per record, electrodiagnostic study failed to demonstrate significant etiology, We have reviewed MRI lumbar in 2012, there was evidence of L4-5 spinal stenosis, and the most recent MRI Lumbar was in June 2014,progression of multilevel facet arthropathy, status post a laminectomy at L3-4, with residual large synovial cyst extending along the posterior epidural space without significant central canal stenosis, mild foraminal narrowing bilaterally at L3 and 4, progression of mild lateral recess narrowing bilaterally at L4-5, foraminal narrowing is stable at this level MRI of the brain in June 2014 showed mild atrophy, no acute intracranial abnormality  UPDATE Dec 15th 2015:  He is tolerating gabapentin 300 mg 2 tablets every night, Requip 1 mg 2 tablets every night, he can sleep better with the medications, but continue complains of bilateral lower extremity dull achy pain, numbness, urgency to move, especially at nighttime, He also complains of slow progressive gait difficulty, grasps things for support, he has urinary urgency, but no incontinence.  UPDATE Jan 29th 2016: He is now taking gabapentin 300mg  2 tabs qhs and requip 1mg , 3 tabs qhs, which has been very helpful, 30%, improvement, he still has bilatarel leg pain at evening, stretching,massage, He denies bowel and bladder incontinence , he has tinlging at the soles of his feet.   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  He continue complaining significant difficulty at nighttime, came  in with a full page list of symptoms, in the morning time, he denies significant back pain, no low back pain, symptoms started in the late afternoon, gradually building up, around 9: to10 PM, he noticed achy feeling down lateral side of both leg, increased by sitting still, intense urge to lie on the floor, stretching muscle backwards, late night hot soaking bath to help him sleep, he has to take about 3 AM last night, difficulty sleeping, excessive fatigue during the daytime, could not sit through movies, standing up in the aisle 2/3 of the movie He is now taking, hydrocodone 5/325mg  2 tabs 9pm, advil 200mg  2 tab   He has quit taking Requip 1 mg, up to 4 tablets every night, also gabapentin, complains of upset stomach, without relieving his symptoms, he is now taking Advil 200 mg 2 tablets as needed, hydrocodone/Tylenol 5/325 mg 2 tablets every night for symptoms control, which only mild, temporarily, Tylenol helps some too.  UPDATE April 29th 2016: Laboratory showed low iron, ferritin level at 9, he was put on iron supplement, his restless leg symptoms has much improved, He is now taking clonazepam 0.5 milligram every night, Lyrica 50 mg every night, he can sleep much better,  He continue, combat right ankle pain  UPDATE August 2nd 2016: He sleeps well, he is now taking clonazepam 0.5mg  qhs, Lyrica 50mg  3 tabs qhs, no longer on Hydrocodone, he complains of side effect  UPDATE 07/10/15 (MM): 07/10/15: [name] is a  75 year old male with a history of restless leg syndrome. He returns today for follow-up. The patient states that since he began taking Klonopin , Lyrica and the iron supplement his symptoms have drastically improved. He states that he typically takes a hot bath before bed and this has also helped his symptoms. He does not have any of the symptoms during the day. He states that occasionally he'll have the achy feeling in his legs but nothing compared to what it was. He states that he is  now sleeping well. He states that he did decrease the Klonopin after having an episode of urinary incontinence during the night. He states that since he decreased the Klonopin he has not had this happen again. He denies any new neurological symptoms. He returns today for an evaluation.  TODAY 01/03/16  [name] is a 75 year old male with a history of restless leg syndrome. He returns today for follow-up. He continues to get good benefit taking Lyrica Klonopin and iron. He states that he "feels the past he's felt in years." His wife just recently returned from a trip to [place]. he states he has been experiencing some back pain and plans to get an epidural steroid injection with Dr. Jacelyn Grip. He denies any new neurological symptoms. He returns today for follow-up.  REVIEW OF SYSTEMS: Out of a complete 14 system review of symptoms, the patient complains only of the following symptoms, and all other reviewed systems are negative.  Back pain, walking difficulty, restless leg  ALLERGIES: Allergies  Allergen Reactions  . Atorvastatin   . Compazine [Prochlorperazine Edisylate]   . Gemfibrozil   . Monascus Purpureus Went Yeast   . Penicillins   . Rosuvastatin     Failed Crestor tiw due to leg aches (tolerated Crestor qweek and biw)  . Welchol [Colesevelam Hcl]     Leg aches    HOME MEDICATIONS: Outpatient Medications Prior to Visit  Medication Sig Dispense Refill  .  ACETAMINOPHEN PO Take 200 mg by mouth every 6 (six) hours as needed.    . Ascorbic Acid (VITAMIN C PO) Take 500 mg by mouth daily.     Marland Kitchen aspirin 81 MG tablet Take 81 mg by mouth daily.    . carvedilol (COREG) 3.125 MG tablet Take 3.125 mg by mouth 2 (two) times daily with a meal.    . clonazePAM (KLONOPIN) 0.5 MG tablet TAKE 1/2 TO 1 TABLET AT BEDTIME AS NEEDED. 30 tablet 5  . COENZYME Q-10 PO Take 1,000 mg by mouth daily.     . CRESTOR 10 MG tablet TAKE 1 TABLET ONCE DAILY OR AS DIRECTED. (Patient taking differently: TAKE 1 TABLET ONCE  DAILY OR AS DIRECTED.  taking 3 tablets per week at this time.  01-03-16) 60 tablet 6  . ezetimibe (ZETIA) 10 MG tablet Take 1 tablet (10 mg total) by mouth daily. 30 tablet 6  . Ferrous Fum-Iron Polysacch-FA 162-115.2-1 MG CAPS Take 1 tablet by mouth daily. 90 each 3  . lisinopril (PRINIVIL,ZESTRIL) 2.5 MG tablet Take 1 tablet (2.5 mg total) by mouth daily. 90 tablet 3  . omeprazole (PRILOSEC OTC) 20 MG tablet Take 20 mg by mouth daily.    . sertraline (ZOLOFT) 50 MG tablet Take 50 mg by mouth daily.     Marland Kitchen LYRICA 100 MG capsule Take 1 capsule (100 mg total) by mouth 2 (two) times daily. 60 capsule 5   No facility-administered medications prior to visit.     PAST MEDICAL HISTORY: Past Medical History:  Diagnosis Date  . Atrial fibrillation ([place]) 1962  . Chest pain    Myocardial infarction ruled out  . Esophageal reflux   . HTN (hypertension)   . Hyperlipidemia   . Hypertensive cardiovascular disease   . Leg pain, bilateral   . Paroxysmal atrial flutter (HCC)    History of Paroxysmal atrial flutter   . Scarlet fever 1945  . Ulcerative proctitis ([place])     PAST SURGICAL HISTORY: Past Surgical History:  Procedure Laterality Date  . ANKLE RECONSTRUCTION Right 2007   [place]  . CARDIAC CATHETERIZATION  03/03/2008   Left heart cardiac catheterization and coronary  . CARDIAC CATHETERIZATION  1997   Dr Wynonia Lawman  . dex insection Left 12/2012   ear  . KNEE ARTHROSCOPY  1991, 1995  . LUMBAR LAMINECTOMY  12/2010    FAMILY HISTORY: Family History  Problem Relation Age of Onset  . Prostate cancer Father   . Heart attack Brother   . Kidney cancer Brother   . Brain cancer Brother     SOCIAL HISTORY: Social History   Social History  . Marital status: Married    Spouse name: Bethena Roys  . Number of children: 3  . Years of education: college   Occupational History  .      Retired    Social History Main Topics  . Smoking status: Former Research scientist (life sciences)  . Smokeless tobacco: Never Used      Comment: quit 1980  . Alcohol use 0.0 oz/week     Comment: 1-3 weekly  . Drug use: No  . Sexual activity: Not on file   Other Topics Concern  . Not on file   Social History Narrative   Patient is retired and lives at home with his wife Bethena Roys.    Education college.   Caffeine one cup daily.      PHYSICAL EXAM  Vitals:   01/03/16 1329  BP: 117/71  Pulse: 68  Weight:  255 lb 12.8 oz (116 kg)  Height: 5\' 10"  (1.778 m)   Body mass index is 36.7 kg/m.  Generalized: Well developed, in no acute distress   Neurological examination  Mentation: Alert oriented to time, place, history taking. Follows all commands speech and language fluent Cranial nerve II-XII: Pupils were equal round reactive to light. Extraocular movements were full, visual field were full on confrontational test. Facial sensation and strength were normal. Uvula tongue midline. Head turning and shoulder shrug  were normal and symmetric. Motor: The motor testing reveals 5 over 5 strength of all 4 extremities. Good symmetric motor tone is noted throughout.  Sensory: Sensory testing is intact to soft touch on all 4 extremities. No evidence of extinction is noted.  Coordination: Cerebellar testing reveals good finger-nose-finger and heel-to-shin bilaterally.  Gait and station: Gait is normal. Reflexes: Deep tendon reflexes are symmetric and normal bilaterally.   DIAGNOSTIC DATA (LABS, IMAGING, TESTING) - I reviewed patient records, labs, notes, testing and imaging myself where available.      ASSESSMENT AND PLAN 75 y.o. year old male  has a past medical history of Atrial fibrillation ([place]) (1962); Chest pain; Esophageal reflux; HTN (hypertension); Hyperlipidemia; Hypertensive cardiovascular disease; Leg pain, bilateral; Paroxysmal atrial flutter ([place]); Scarlet fever (1945); and Ulcerative proctitis ([place]). here with:  1. Restless leg syndrome  Overall the patient is doing well. He will continue on Lyrica, Klonopin  and iron. Advised that if his symptoms worsen or he develops any new symptoms he should let us know. Follow-up in 6 months with Dr. [name].    Ward Givens, MSN, NP-C 01/03/2016, 2:08 PM [facility] [address] [place], [place] [address] [phone]

## 2016-01-03 NOTE — Patient Instructions (Signed)
Continue Lyrica, iron and Klonopin  If your symptoms worsen or you develop new symptoms please let us know.

## 2016-01-08 ENCOUNTER — Ambulatory Visit: Payer: Medicare Other | Admitting: Adult Health

## 2016-01-18 ENCOUNTER — Other Ambulatory Visit: Payer: Self-pay | Admitting: Cardiology

## 2016-01-18 ENCOUNTER — Encounter: Payer: Self-pay | Admitting: Cardiology

## 2016-01-18 LAB — HEPATIC FUNCTION PANEL
ALT: 22 U/L (ref 9–46)
AST: 34 U/L (ref 10–35)
Albumin: 4.4 g/dL (ref 3.6–5.1)
Alkaline Phosphatase: 65 U/L (ref 40–115)
BILIRUBIN DIRECT: 0.1 mg/dL (ref <=0.2)
BILIRUBIN INDIRECT: 0.3 mg/dL (ref 0.2–1.2)
BILIRUBIN TOTAL: 0.4 mg/dL (ref 0.2–1.2)
Total Protein: 6.5 g/dL (ref 6.1–8.1)

## 2016-01-18 LAB — LIPID PANEL
CHOL/HDL RATIO: 3.8 ratio (ref <=5.0)
Cholesterol: 117 mg/dL — ABNORMAL LOW (ref 125–200)
HDL: 31 mg/dL — AB (ref >=40)
LDL CALC: 65 mg/dL (ref <130)
TRIGLYCERIDES: 107 mg/dL (ref <150)
VLDL: 21 mg/dL (ref <30)

## 2016-01-30 ENCOUNTER — Encounter: Payer: Self-pay | Admitting: Nurse Practitioner

## 2016-01-30 ENCOUNTER — Ambulatory Visit (INDEPENDENT_AMBULATORY_CARE_PROVIDER_SITE_OTHER): Payer: Medicare Other | Admitting: Nurse Practitioner

## 2016-01-30 VITALS — BP 115/73 | HR 60 | Ht 70.0 in | Wt 252.0 lb

## 2016-01-30 DIAGNOSIS — E785 Hyperlipidemia, unspecified: Secondary | ICD-10-CM

## 2016-01-30 DIAGNOSIS — I10 Essential (primary) hypertension: Secondary | ICD-10-CM | POA: Insufficient documentation

## 2016-01-30 NOTE — Progress Notes (Signed)
Office Visit    Patient Name: [name] Date of Encounter: 01/30/2016  Primary Care Provider:  Cari Caraway, MD Primary Cardiologist:  P. Martinique, MD   Chief Complaint    75 year old male with a history of hypertension and hyperlipidemia who presents for follow-up.  Past Medical History    Past Medical History:  Diagnosis Date  . Atrial fibrillation ([place]) 1962   a. No recurrence since 1962.  . Chest pain    a. 1998 Cath: nl cors;  b. 2009 Cath: nl cors.  . Esophageal reflux   . Essential hypertension   . History of Paroxysmal atrial flutter ([place])   . Hyperlipidemia   . Leg pain, bilateral   . Scarlet fever 1945  . Ulcerative proctitis [facility])    Past Surgical History:  Procedure Laterality Date  . ANKLE RECONSTRUCTION Right 2007   [place]  . CARDIAC CATHETERIZATION  03/03/2008   Left heart cardiac catheterization and coronary  . CARDIAC CATHETERIZATION  1997   Dr Wynonia Lawman  . dex insection Left 12/2012   ear  . KNEE ARTHROSCOPY  1991, 1995  . LUMBAR LAMINECTOMY  12/2010    Allergies  Allergies  Allergen Reactions  . Atorvastatin   . Compazine [Prochlorperazine Edisylate]   . Gemfibrozil   . Monascus Purpureus Went Yeast   . Niacin And Related     [place]  . Penicillins   . Rosuvastatin     Failed Crestor tiw due to leg aches (tolerated Crestor qweek and biw)  . Welchol [Colesevelam Hcl]     Leg aches    History of Present Illness    75 year old male with a history of hypertension, hyperlipidemia, and obesity. He has prior intolerance to statins due to severe myalgias but is currently tolerating low-dose Crestor 4 days per week along with Zetia. He has no prior history of CAD, PAD, or CVA/TIA. He has had catheterizations performed in 1998 and again in 2009, both of which were revealing normal coronary arteries. Last lipids from December of 2016 showed a total cholesterol of 120,Triglycerides 172, HDL 23, and LDL of 63. LFTs were normal at that  time.  Since his last visit, he is continued to do well. He tries to walk as much as he can but says this is limited to about a quarter mile a few days a week. He has chronic right knee pain, which limits his activity. That said, he travels quite a bit and recently came back from a [place] cruise. He denies chest pain, dyspnea, PND, orthopnea, dizziness, syncope, edema, or early satiety. He recently had follow-up lipids with an LDL of 65. He is now tolerating Crestor 5 days a week.  Home Medications    Prior to Admission medications   Medication Sig Start Date End Date Taking? Authorizing Provider  ACETAMINOPHEN PO Take 200 mg by mouth every 6 (six) hours as needed.    Historical Provider, MD  Ascorbic Acid (VITAMIN C PO) Take 500 mg by mouth daily.     Historical Provider, MD  aspirin 81 MG tablet Take 81 mg by mouth daily.    Historical Provider, MD  carvedilol (COREG) 3.125 MG tablet Take 3.125 mg by mouth 2 (two) times daily with a meal.    Historical Provider, MD  clonazePAM (KLONOPIN) 0.5 MG tablet TAKE 1/2 TO 1 TABLET AT BEDTIME AS NEEDED. 11/21/15   Marcial Pacas, MD  COENZYME Q-10 PO Take 1,000 mg by mouth daily.     Historical Provider, MD  CRESTOR 10 MG tablet TAKE 1 TABLET ONCE DAILY OR AS DIRECTED. Patient taking differently: TAKE 1 TABLET ONCE DAILY OR AS DIRECTED.  taking 3 tablets per week at this time.  01-03-16 08/03/15   Peter M Martinique, MD  ezetimibe (ZETIA) 10 MG tablet Take 1 tablet (10 mg total) by mouth daily. 10/13/13   Peter M Martinique, MD  Ferrous Fum-Iron Polysacch-FA [id number] MG CAPS Take 1 tablet by mouth daily. 07/10/15   Ward Givens, NP  lisinopril (PRINIVIL,ZESTRIL) 2.5 MG tablet Take 1 tablet (2.5 mg total) by mouth daily. 07/18/14   Peter M Martinique, MD  LYRICA 100 MG capsule Take 1 capsule (100 mg total) by mouth 2 (two) times daily. 01/03/16   Ward Givens, NP  omeprazole (PRILOSEC OTC) 20 MG tablet Take 20 mg by mouth daily.    Historical Provider, MD  sertraline  (ZOLOFT) 50 MG tablet Take 50 mg by mouth daily.     Historical Provider, MD    Review of Systems    As above, doing well. Activities limited by right knee and left shoulder pain.  He denies chest pain, palpitations, dyspnea, pnd, orthopnea, n, v, dizziness, syncope, edema, weight gain, or early satiety.  All other systems reviewed and are otherwise negative except as noted above.  Physical Exam    VS:  BP 115/73   Pulse 60   Ht 5\' 10"  (1.778 m)   Wt 252 lb (114.3 kg)   BMI 36.16 kg/m  , BMI Body mass index is 36.16 kg/m. GEN: Well nourished, well developed, in no acute distress.  HEENT: normal.  Neck: Supple, no JVD, carotid bruits, or masses. Cardiac: RRR, no murmurs, rubs, or gallops. No clubbing, cyanosis, edema.  Radials/DP/PT 2+ and equal bilaterally.  Respiratory:  Respirations regular and unlabored, clear to auscultation bilaterally. GI: Soft, nontender, nondistended, BS + x 4. MS: no deformity or atrophy. Skin: warm and dry, no rash. Neuro:  Strength and sensation are intact. Psych: Normal affect.  Accessory Clinical Findings    ECG - Regular sinus rhythm, 60, left axis, prior inferior infarct, no acute ST or T changes.  Assessment & Plan    1.  Hyperlipidemia: Patient had follow-up lipids which we were able to obtain from salt his labs and will have to have scanned into [place]. His LDL was 65. He is now tolerating Crestor 5 days per week. He says he doesn't think he ever had a true allergy to Crestor but instead was having back pain. He also remains on Zetia. LFTs were within normal limits. No change to therapy at this time.  2. Essential hypertension: Blood pressure is stable on beta blocker and ACE inhibitor therapy. No changes necessary today.  3. Morbid obesity: We discussed the importance of regular activity and calorie restriction. Unfortunate, his activities limited by right knee and left shoulder pain.  4. Disposition: Follow-up with Dr. Martinique in  one year or sooner if necessary.  Murray Hodgkins, NP 01/30/2016, 5:13 PM

## 2016-01-30 NOTE — Patient Instructions (Signed)
Medication Instructions:  Your physician recommends that you continue on your current medications as directed. Please refer to the Current Medication list given to you today.  Labwork: None Ordered  Testing/Procedures: None Ordered  Follow-Up: Your physician wants you to follow-up in: 68 MONTH with DR Martinique. You will receive a reminder letter in the mail two months in advance. If you don't receive a letter, please call our office to schedule the follow-up appointment.  Any Other Special Instructions Will Be Listed Below (If Applicable).  CONTACT OFFICE SOONER THAN A YEAR IF NEEDED.    If you need a refill on your cardiac medications before your next appointment, please call your pharmacy.

## 2016-02-08 ENCOUNTER — Other Ambulatory Visit: Payer: Self-pay | Admitting: Neurology

## 2016-03-16 ENCOUNTER — Other Ambulatory Visit: Payer: Self-pay | Admitting: Orthopaedic Surgery

## 2016-03-16 DIAGNOSIS — M5442 Lumbago with sciatica, left side: Secondary | ICD-10-CM

## 2016-03-25 ENCOUNTER — Ambulatory Visit
Admission: RE | Admit: 2016-03-25 | Discharge: 2016-03-25 | Disposition: A | Discharge status: Home / Self Care | Payer: Medicare Other | Source: Ambulatory Visit | Attending: Orthopaedic Surgery | Admitting: Orthopaedic Surgery

## 2016-03-25 DIAGNOSIS — M5442 Lumbago with sciatica, left side: Secondary | ICD-10-CM

## 2016-03-26 ENCOUNTER — Encounter: Payer: Self-pay | Admitting: Neurology

## 2016-04-03 ENCOUNTER — Other Ambulatory Visit: Payer: Self-pay | Admitting: Neurosurgery

## 2016-04-07 ENCOUNTER — Encounter: Payer: Self-pay | Admitting: Adult Health

## 2016-04-08 NOTE — Telephone Encounter (Signed)
Patient would like to discuss MRI scan and pending surgery. He would like an appointment with Dr. [name] or me. Can Dr. [name] work in him to her schedule. I will not be able to review scan with him.

## 2016-04-09 ENCOUNTER — Telehealth: Payer: Self-pay | Admitting: *Deleted

## 2016-04-09 NOTE — Telephone Encounter (Signed)
Spoke to patient - he is having back surgery with Dr. Vertell Limber on 04/18/16.  He has been prescribed oxycodone for his post-surgical pain.  Dr. Vertell Limber is aware that he is taking Lyrica 100mg , BID.  He still has concerns about taking the two medications together and wanted to clear this with you.

## 2016-04-10 NOTE — Telephone Encounter (Signed)
Please call patient, it is okay to take Lyrica together with narcotics if needed,  But if he is taking narcotics for postsurgical pain, he may be able to decrease Lyrica to 100 mg every night to decrease the side effect of polypharmacy,  Once he is off the narcotics, he may consider increase Lyrica to 100 mg twice a day if needed

## 2016-04-10 NOTE — Telephone Encounter (Signed)
Spoke to patient - he is aware of Dr. Rhea Belton response and verbalized understanding.

## 2016-04-12 NOTE — Pre-Procedure Instructions (Addendum)
[name]  04/12/2016      [place], [place] [address] Phone: [phone] Fax: [phone]    Your procedure is scheduled on November 16  Thursday   Report to [place] at [place].M.  Call this number if you have problems the morning of surgery:  [phone]   Remember:  Do not eat food or drink liquids after midnight.   Take these medicines the morning of surgery with A SIP OF WATER ACETAMINOPHEN, carvedilol (COREG), clonazePAM (KLONOPIN), LYRICA, omeprazole (PRILOSEC OTC), sertraline (ZOLOFT)  7 days prior to surgery STOP taking any Aspirin, Aleve, Naproxen, Ibuprofen, Motrin, Advil, Goody's, BC's, all herbal medications, fish oil, and all vitamins    Do not wear jewelry.  Do not wear lotions, powders, or cologne, or deoderant.  Men may shave face and neck.  Do not bring valuables to the hospital.  [facility] is not responsible for any belongings or valuables.  Contacts, dentures or bridgework may not be worn into surgery.  Leave your suitcase in the car.  After surgery it may be brought to your room.  For patients admitted to the hospital, discharge time will be determined by your treatment team.  Patients discharged the day of surgery will not be allowed to drive home.    Special instructions:   [facility]- Preparing For Surgery  Before surgery, you can play an important role. Because skin is not sterile, your skin needs to be as free of germs as possible. You can reduce the number of germs on your skin by washing with CHG (chlorahexidine gluconate) Soap before surgery.  CHG is an antiseptic cleaner which kills germs and bonds with the skin to continue killing germs even after washing.  Please do not use if you have an allergy to CHG or antibacterial soaps. If your skin becomes reddened/irritated stop using the CHG.  Do not shave (including legs and  underarms) for at least 48 hours prior to first CHG shower. It is OK to shave your face.  Please follow these instructions carefully.   1. Shower the NIGHT BEFORE SURGERY and the MORNING OF SURGERY with CHG.   2. If you chose to wash your hair, wash your hair first as usual with your normal shampoo.  3. After you shampoo, rinse your hair and body thoroughly to remove the shampoo.  4. Use CHG as you would any other liquid soap. You can apply CHG directly to the skin and wash gently with a scrungie or a clean washcloth.   5. Apply the CHG Soap to your body ONLY FROM THE NECK DOWN.  Do not use on open wounds or open sores. Avoid contact with your eyes, ears, mouth and genitals (private parts). Wash genitals (private parts) with your normal soap.  6. Wash thoroughly, paying special attention to the area where your surgery will be performed.  7. Thoroughly rinse your body with warm water from the neck down.  8. DO NOT shower/wash with your normal soap after using and rinsing off the CHG Soap.  9. Pat yourself dry with a CLEAN TOWEL.   10. Wear CLEAN PAJAMAS   11. Place CLEAN SHEETS on your bed the night of your first shower and DO NOT SLEEP WITH PETS.    Day of Surgery: Do not apply any deodorants/lotions. Please wear clean clothes to the hospital/surgery center.      Please  read over the following fact sheets that you were given.

## 2016-04-15 ENCOUNTER — Encounter (HOSPITAL_COMMUNITY)
Admission: RE | Admit: 2016-04-15 | Discharge: 2016-04-15 | Disposition: A | Discharge status: Home / Self Care | Payer: Medicare Other | Source: Ambulatory Visit | Attending: Neurosurgery | Admitting: Neurosurgery

## 2016-04-15 ENCOUNTER — Encounter (HOSPITAL_COMMUNITY): Payer: Self-pay

## 2016-04-15 DIAGNOSIS — K5120 Ulcerative (chronic) proctitis without complications: Secondary | ICD-10-CM | POA: Insufficient documentation

## 2016-04-15 DIAGNOSIS — N183 Chronic kidney disease, stage 3 (moderate): Secondary | ICD-10-CM | POA: Insufficient documentation

## 2016-04-15 DIAGNOSIS — F339 Major depressive disorder, recurrent, unspecified: Secondary | ICD-10-CM | POA: Insufficient documentation

## 2016-04-15 DIAGNOSIS — K219 Gastro-esophageal reflux disease without esophagitis: Secondary | ICD-10-CM | POA: Insufficient documentation

## 2016-04-15 DIAGNOSIS — R000 Tachycardia, unspecified: Secondary | ICD-10-CM

## 2016-04-15 DIAGNOSIS — Z01812 Encounter for preprocedural laboratory examination: Secondary | ICD-10-CM

## 2016-04-15 DIAGNOSIS — R7301 Impaired fasting glucose: Secondary | ICD-10-CM | POA: Insufficient documentation

## 2016-04-15 DIAGNOSIS — I129 Hypertensive chronic kidney disease with stage 1 through stage 4 chronic kidney disease, or unspecified chronic kidney disease: Secondary | ICD-10-CM | POA: Insufficient documentation

## 2016-04-15 DIAGNOSIS — M1990 Unspecified osteoarthritis, unspecified site: Secondary | ICD-10-CM | POA: Insufficient documentation

## 2016-04-15 DIAGNOSIS — M4800 Spinal stenosis, site unspecified: Secondary | ICD-10-CM | POA: Insufficient documentation

## 2016-04-15 DIAGNOSIS — E782 Mixed hyperlipidemia: Secondary | ICD-10-CM | POA: Insufficient documentation

## 2016-04-15 DIAGNOSIS — G2581 Restless legs syndrome: Secondary | ICD-10-CM

## 2016-04-15 DIAGNOSIS — M79605 Pain in left leg: Secondary | ICD-10-CM | POA: Insufficient documentation

## 2016-04-15 DIAGNOSIS — M79604 Pain in right leg: Secondary | ICD-10-CM

## 2016-04-15 HISTORY — DX: Nausea with vomiting, unspecified: R11.2

## 2016-04-15 HISTORY — DX: Unspecified osteoarthritis, unspecified site: M19.90

## 2016-04-15 HISTORY — DX: Unspecified staphylococcus as the cause of diseases classified elsewhere: B95.8

## 2016-04-15 HISTORY — DX: Cardiac arrhythmia, unspecified: I49.9

## 2016-04-15 HISTORY — DX: Other specified postprocedural states: Z98.890

## 2016-04-15 HISTORY — DX: Local infection of the skin and subcutaneous tissue, unspecified: L08.9

## 2016-04-15 HISTORY — DX: Major depressive disorder, single episode, unspecified: F32.9

## 2016-04-15 HISTORY — DX: Nocturia: R35.1

## 2016-04-15 HISTORY — DX: Depression, unspecified: F32.A

## 2016-04-15 LAB — BASIC METABOLIC PANEL
Anion gap: 7 (ref 5–15)
BUN: 20 mg/dL (ref 6–20)
CALCIUM: 9.2 mg/dL (ref 8.9–10.3)
CO2: 26 mmol/L (ref 22–32)
CREATININE: 1.27 mg/dL — AB (ref 0.61–1.24)
Chloride: 104 mmol/L (ref 101–111)
GFR calc Af Amer: >60 mL/min (ref >60)
GFR calc non Af Amer: 53 mL/min — ABNORMAL LOW (ref >60)
GLUCOSE: 88 mg/dL (ref 65–99)
Potassium: 4.9 mmol/L (ref 3.5–5.1)
Sodium: 137 mmol/L (ref 135–145)

## 2016-04-15 LAB — CBC
HEMATOCRIT: 44 % (ref 39.0–52.0)
Hemoglobin: 14.9 g/dL (ref 13.0–17.0)
MCH: 31.6 pg (ref 26.0–34.0)
MCHC: 33.9 g/dL (ref 30.0–36.0)
MCV: 93.4 fL (ref 78.0–100.0)
Platelets: 201 10*3/uL (ref 150–400)
RBC: 4.71 MIL/uL (ref 4.22–5.81)
RDW: 13.6 % (ref 11.5–15.5)
WBC: 7.4 10*3/uL (ref 4.0–10.5)

## 2016-04-15 LAB — SURGICAL PCR SCREEN
MRSA, PCR: NEGATIVE
Staphylococcus aureus: POSITIVE — AB

## 2016-04-15 LAB — ABO/RH: ABO/RH(D): O POS

## 2016-04-15 LAB — TYPE AND SCREEN
ABO/RH(D): O POS
Antibody Screen: NEGATIVE

## 2016-04-15 NOTE — Progress Notes (Signed)
   04/15/16 0950  OBSTRUCTIVE SLEEP APNEA  Have you ever been diagnosed with sleep apnea through a sleep study? No  Do you snore loudly (loud enough to be heard through closed doors)?  0  Do you often feel tired, fatigued, or sleepy during the daytime (such as falling asleep during driving or talking to someone)? 0  Has anyone observed you stop breathing during your sleep? 0  Do you have, or are you being treated for high blood pressure? 1  BMI more than 35 kg/m2? 1  Age > 50 (1-yes) 1  Neck circumference greater than:Male 16 inches or larger, Male 17inches or larger? 1  Male Gender (Yes=1) 1  Obstructive Sleep Apnea Score 5  Score 5 or greater  Results sent to PCP

## 2016-04-16 NOTE — Progress Notes (Signed)
Anesthesia Chart Review:  Pt is a 75 year old male scheduled for right L3-4, L4-5 anterolateral lumbar interbody fusion on 04/18/2016 with Erline Levine, MD.   - Cardiologist is Peter Martinique, MD, last office visit 01/30/16 with Murray Hodgkins, NP, follow up in 1 year recommended.  - PCP is Cari Caraway, MD  PMH includes:  Atrial fibrillation (none since 1962), paroxysmal atrial flutter, HTN, ulcerative proctitis, post-op N/V, GERD.  Current smoker. BMI 35  Medications include: ASA, carvedilol, crestor, zetia, iron, lisinopril, prilosec  Preoperative labs reviewed.    EKG 01/30/16: NSR. LAD. Inferior infarct, age undetermined. Appears stable since 2012 EKG.   Cardiac cath 03/03/08: Normal coronaries  If no changes, I anticipate pt can proceed with surgery as scheduled.   Willeen Cass, FNP-BC [facility]/Anesthesiology Phone: [phone] 04/16/2016 11:03 AM

## 2016-04-16 NOTE — H&P (Signed)
Patient ID:   [id number] Patient: [name]  Date of Birth: Sep 16, 1940 Visit Type: Office Visit   Date: 04/03/2016 11:30 AM Provider: Marchia Meiers. Vertell Limber MD   This 75 year old male presents for back pain.  History of Present Illness: 1.  back pain    [name], 75 year old retired male, and father of [name] CRNA at [employer] [facility] [employer], visits for evaluation of increasing left leg pain and weakness.  Urinary urge and stress incontinence has become problematic over the last month. His left leg pain radiates into his shin and he has occasional bilateral foot tingling. His pain is relieved when he leans forward.   Lyrica 100 mg twice a day Ibuprofen 200 mg one per day, instructed to avoid due to kidney issues Norco caused severe nausea  ESI's last year Physical therapy in years past  History: HTN, depression, cholesterol, GERD Surgical history: Cataract surgery 03/12/16, right ankle fracture repair 2007, laminectomy bilateral L3-L5 2012 by Dr. [name], right knee scope 1991 & 2017  X-rays on Can[facility] reveal L4-5 spondylolisthesis   03/25/16 MRI: Progressive lumbar spondylosis and degenerative disc disease, causing mild to moderate impingement at L3-4 and mild impingement at L2-3 and L4-5 as detailed above. Prior posterior decompression at L3-L4-L5.        PAST MEDICAL/SURGICAL HISTORY   (Detailed)  Disease/disorder Onset Date Management Date Comments    Cataract extraction    Anxiety      Arm weakness    LRH 04/03/2016 -  Depression      Hypertension         PAST MEDICAL HISTORY, SURGICAL HISTORY, FAMILY HISTORY, SOCIAL HISTORY AND REVIEW OF SYSTEMS  04/03/2016, which I have signed.  Family History  (Detailed)   SOCIAL HISTORY  (Detailed) Tobacco use reviewed. Preferred language is Unknown.   Smoking status: Light tobacco smoker.  SMOKING STATUS Use Status Type Smoking Status Usage Per Day Years Used Total Pack Years  yes Pipe Light tobacco smoker 1 Pipes             MEDICATIONS(added, continued or stopped this visit): Started Medication Directions Instruction Stopped   aspirin 81 mg tablet,delayed release take 1 tablet by oral route  every day     clonazepam 0.5 mg tablet take 1 tablet by oral route  every day     Coreg 3.125 mg tablet take 1 tablet by oral route 2 times every day with food     Lyrica 100 mg capsule take 1 capsule by oral route 2 times every day    04/03/2016 Percocet 5 mg-325 mg tablet ake 1 po q6hrs prn pain     Tylenol 325 mg tablet take 1 tablet by oral route  every 4 hours as needed       ALLERGIES: Ingredient Reaction Medication Name Comment  PROCHLORPERAZINE  Compazine   PENICILLINS Unknown     Reviewed, updated.    Vitals Date Temp F BP Pulse Ht In Wt Lb BMI BSA Pain Score  04/03/2016  135/83 64 70 249 35.73  9/10     PHYSICAL EXAM General Level of Distress: no acute distress Overall Appearance: normal    Cardiovascular Cardiac: regular rate and rhythm without murmur  Respiratory Lungs: clear to auscultation  Neurological Recent and Remote Memory: normal Attention Span and Concentration:   normal Language: normal Fund of Knowledge: normal  Right Left Sensation: normal normal Upper Extremity Coordination: normal normal  Lower Extremity Coordination: normal normal  Musculoskeletal Gait and Station: normal  Right Left Upper Extremity Muscle Strength: normal normal Lower Extremity Muscle Strength: normal normal Upper Extremity Muscle Tone:  normal normal Lower Extremity Muscle Tone: normal normal  Motor Strength Upper and lower extremity motor strength was tested in the clinically pertinent muscles.     Deep Tendon Reflexes  Right Left Biceps: normal normal Triceps: normal normal Brachiloradialis: normal normal Patellar: normal normal Achilles: normal normal  Sensory Sensation was tested at L1 to S1.   Cranial Nerves II. Optic Nerve/Visual Fields: normal III.  Oculomotor: normal IV. Trochlear: normal V. Trigeminal: normal VI. Abducens: normal VII. Facial: normal VIII. Acoustic/Vestibular: normal IX. Glossopharyngeal: normal X. Vagus: normal XI. Spinal Accessory: normal XII. Hypoglossal: normal  Motor and other Tests Lhermittes: negative Rhomberg: negative    Right Left Hoffman's: normal normal Clonus: normal normal Babinski: normal normal SLR: negative positive Patrick's Corky Sox): negative negative Toe Walk: normal normal Toe Lift: normal normal Heel Walk: normal normal SI Joint: nontender nontender   Additional Findings:  UE and LE strength is normal, no pain to palpation on low back or sciatic notch discomfort   DIAGNOSTIC RESULTS X-rays on Canopy reveal L4-5 spondylolisthesis   03/25/16 MRI: Progressive lumbar spondylosis and degenerative disc disease, causing mild to moderate impingement at L3-4 and mild impingement at L2-3 and L4-5 as detailed above. Prior posterior decompression at L3-L4-L5.    IMPRESSION The patient is experiencing left buttock and leg pain/weakness as well as urinary urgency. On review of his imaging, he has progressive spondylosis and degeneration throughout his lumbar spine that is causing moderate impingement at L3-4 and L4-5. His facet joint at L3-4 is very incompetent and there is a spondylolisthesis at the L4-5 level. On confrontational testing, his strength and reflexes are normal, but he has a positive SLR on the left. I recommend an L3-4, L4-5 XLIF for alleviation of his left leg symptoms. As far as his urinary urgency, I believe this may be due to his extreme pain and that he is holding his urine because it is so painful to get up at night. I will prescribe him with percocet for additional pain relief.   Completed Orders (this encounter) Order Details Reason Side Interpretation Result Initial Treatment Date Region  Lumbar Spine- AP/Lat/Flex/Ex      04/03/2016 All Levels to All Levels    Assessment/Plan # Detail Type Description   1. Assessment Lumbar radiculopathy (M54.16).       2. 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         Prescribed percocet. We discussed the importance of weight loss. Schedule L3-4, L4-5 XLIF. Nurse education given. Fitted for LSO brace.   Orders: Diagnostic Procedures: Assessment Procedure  M54.16 Lumbar Spine- AP/Lat/Flex/Ex  Miscellaneous: Assessment   M99.83 Aspen Lo Sag Rigid Panel Quick    MEDICATIONS PRESCRIBED TODAY    Rx Quantity Refills  PERCOCET 5 mg-325 mg  60 0            Provider:  Marchia Meiers. Vertell Limber MD  04/03/2016 12:52 PM Dictation edited by: Johnella Moloney    CC Providers: [facility] [address] [place],  [place]  [id number]-   [facility] [place], [place] [address]-              Electronically signed by Marchia Meiers. Vertell Limber MD on 04/06/2016 12:37 PM

## 2016-04-17 MED ORDER — VANCOMYCIN HCL 10 G IV SOLR
1500.0000 mg | INTRAVENOUS | Status: AC
  Administered 2016-04-18: 1500 mg via INTRAVENOUS
  Filled 2016-04-17: qty 1500

## 2016-04-17 NOTE — Anesthesia Preprocedure Evaluation (Addendum)
Anesthesia Evaluation  Patient identified by MRN, date of birth, ID band Patient awake    Reviewed: Allergy & Precautions, H&P , NPO status , Patient's Chart, lab work & pertinent test results, reviewed documented beta blocker date and time   History of Anesthesia Complications (+) PONV  Airway Mallampati: I  TM Distance: >3 FB Neck ROM: Full    Dental no notable dental hx. (+) Teeth Intact, Dental Advisory Given   Pulmonary Current Smoker,    Pulmonary exam normal breath sounds clear to auscultation       Cardiovascular hypertension, Pt. on medications and Pt. on home beta blockers + dysrhythmias Atrial Fibrillation  Rhythm:Regular Rate:Normal     Neuro/Psych Depression negative neurological ROS  negative psych ROS   GI/Hepatic Neg liver ROS, PUD, GERD  Medicated and Controlled,  Endo/Other  negative endocrine ROS  Renal/GU negative Renal ROS  negative genitourinary   Musculoskeletal  (+) Arthritis , Osteoarthritis,    Abdominal   Peds  Hematology negative hematology ROS (+)   Anesthesia Other Findings   Reproductive/Obstetrics negative OB ROS                            Anesthesia Physical Anesthesia Plan  ASA: II  Anesthesia Plan: General   Post-op Pain Management:    Induction: Intravenous  Airway Management Planned: Oral ETT  Additional Equipment:   Intra-op Plan:   Post-operative Plan: Extubation in OR  Informed Consent: I have reviewed the patients History and Physical, chart, labs and discussed the procedure including the risks, benefits and alternatives for the proposed anesthesia with the patient or authorized representative who has indicated his/her understanding and acceptance.   Dental advisory given  Plan Discussed with: CRNA  Anesthesia Plan Comments:        Anesthesia Quick Evaluation

## 2016-04-18 ENCOUNTER — Inpatient Hospital Stay (HOSPITAL_COMMUNITY): Payer: Medicare Other

## 2016-04-18 ENCOUNTER — Encounter (HOSPITAL_COMMUNITY)
Admission: RE | Disposition: A | Discharge status: Skilled Nursing Facility | Payer: Self-pay | Source: Ambulatory Visit | Attending: Neurosurgery

## 2016-04-18 ENCOUNTER — Inpatient Hospital Stay (HOSPITAL_COMMUNITY): Payer: Medicare Other | Admitting: Emergency Medicine

## 2016-04-18 ENCOUNTER — Inpatient Hospital Stay (HOSPITAL_COMMUNITY): Payer: Medicare Other | Admitting: Anesthesiology

## 2016-04-18 ENCOUNTER — Inpatient Hospital Stay (HOSPITAL_COMMUNITY)
Admission: RE | Admit: 2016-04-18 | Discharge: 2016-04-22 | DRG: 460 | Disposition: A | Discharge status: Skilled Nursing Facility | Payer: Medicare Other | Source: Ambulatory Visit | Attending: Neurosurgery | Admitting: Neurosurgery

## 2016-04-18 ENCOUNTER — Encounter (HOSPITAL_COMMUNITY): Payer: Self-pay | Admitting: *Deleted

## 2016-04-18 DIAGNOSIS — N3946 Mixed incontinence: Secondary | ICD-10-CM | POA: Diagnosis present

## 2016-04-18 DIAGNOSIS — F419 Anxiety disorder, unspecified: Secondary | ICD-10-CM | POA: Diagnosis present

## 2016-04-18 DIAGNOSIS — M4316 Spondylolisthesis, lumbar region: Secondary | ICD-10-CM | POA: Diagnosis present

## 2016-04-18 DIAGNOSIS — Z8711 Personal history of peptic ulcer disease: Secondary | ICD-10-CM | POA: Diagnosis not present

## 2016-04-18 DIAGNOSIS — K219 Gastro-esophageal reflux disease without esophagitis: Secondary | ICD-10-CM | POA: Diagnosis present

## 2016-04-18 DIAGNOSIS — Z419 Encounter for procedure for purposes other than remedying health state, unspecified: Secondary | ICD-10-CM

## 2016-04-18 DIAGNOSIS — M48062 Spinal stenosis, lumbar region with neurogenic claudication: Secondary | ICD-10-CM | POA: Diagnosis present

## 2016-04-18 DIAGNOSIS — I10 Essential (primary) hypertension: Secondary | ICD-10-CM | POA: Diagnosis present

## 2016-04-18 DIAGNOSIS — Z79899 Other long term (current) drug therapy: Secondary | ICD-10-CM | POA: Diagnosis not present

## 2016-04-18 DIAGNOSIS — E785 Hyperlipidemia, unspecified: Secondary | ICD-10-CM | POA: Diagnosis present

## 2016-04-18 DIAGNOSIS — F329 Major depressive disorder, single episode, unspecified: Secondary | ICD-10-CM | POA: Diagnosis present

## 2016-04-18 DIAGNOSIS — M545 Low back pain: Secondary | ICD-10-CM | POA: Diagnosis present

## 2016-04-18 DIAGNOSIS — F17200 Nicotine dependence, unspecified, uncomplicated: Secondary | ICD-10-CM | POA: Diagnosis present

## 2016-04-18 DIAGNOSIS — I4891 Unspecified atrial fibrillation: Secondary | ICD-10-CM | POA: Diagnosis present

## 2016-04-18 DIAGNOSIS — M5116 Intervertebral disc disorders with radiculopathy, lumbar region: Principal | ICD-10-CM | POA: Diagnosis present

## 2016-04-18 HISTORY — PX: LUMBAR PERCUTANEOUS PEDICLE SCREW 2 LEVEL: SHX5561

## 2016-04-18 HISTORY — PX: ANTERIOR LAT LUMBAR FUSION: SHX1168

## 2016-04-18 SURGERY — ANTERIOR LATERAL LUMBAR FUSION 2 LEVELS
Anesthesia: General | Site: Spine Lumbar | Laterality: Right

## 2016-04-18 MED ORDER — SERTRALINE HCL 50 MG PO TABS
50.0000 mg | ORAL_TABLET | Freq: Every day | ORAL | Status: DC
  Administered 2016-04-19 – 2016-04-22 (×4): 50 mg via ORAL
  Filled 2016-04-18 (×4): qty 1

## 2016-04-18 MED ORDER — SUFENTANIL CITRATE 50 MCG/ML IV SOLN
INTRAVENOUS | Status: DC | PRN
  Administered 2016-04-18 (×4): 5 ug via INTRAVENOUS
  Administered 2016-04-18: 20 ug via INTRAVENOUS

## 2016-04-18 MED ORDER — HYDROMORPHONE HCL 1 MG/ML IJ SOLN
0.2500 mg | INTRAMUSCULAR | Status: DC | PRN
  Administered 2016-04-18 (×4): 0.5 mg via INTRAVENOUS

## 2016-04-18 MED ORDER — HYDROMORPHONE HCL 1 MG/ML IJ SOLN
0.5000 mg | INTRAMUSCULAR | Status: DC | PRN
Start: 2016-04-18 — End: 2016-04-22
  Administered 2016-04-18: 1 mg via INTRAVENOUS
  Filled 2016-04-18: qty 1

## 2016-04-18 MED ORDER — LACTATED RINGERS IV SOLN
INTRAVENOUS | Status: DC | PRN
  Administered 2016-04-18 (×2): via INTRAVENOUS

## 2016-04-18 MED ORDER — PHENYLEPHRINE HCL 10 MG/ML IJ SOLN
INTRAMUSCULAR | Status: DC | PRN
  Administered 2016-04-18 (×2): 40 ug via INTRAVENOUS

## 2016-04-18 MED ORDER — GLYCOPYRROLATE 0.2 MG/ML IJ SOLN
INTRAMUSCULAR | Status: DC | PRN
  Administered 2016-04-18: 0.2 mg via INTRAVENOUS

## 2016-04-18 MED ORDER — LISINOPRIL 2.5 MG PO TABS
2.5000 mg | ORAL_TABLET | Freq: Every day | ORAL | Status: DC
  Administered 2016-04-18 – 2016-04-22 (×3): 2.5 mg via ORAL
  Filled 2016-04-18 (×4): qty 1

## 2016-04-18 MED ORDER — NITROGLYCERIN 0.4 MG SL SUBL: 0.4000 mg | SUBLINGUAL_TABLET | SUBLINGUAL | Status: DC | PRN

## 2016-04-18 MED ORDER — LIDOCAINE-EPINEPHRINE 1 %-1:100000 IJ SOLN
INTRAMUSCULAR | Status: DC | PRN
Start: 2016-04-18 — End: 2016-04-18
  Administered 2016-04-18: 5 mL
  Administered 2016-04-18: 6.5 mL

## 2016-04-18 MED ORDER — LIDOCAINE 2% (20 MG/ML) 5 ML SYRINGE
INTRAMUSCULAR | Status: AC
  Filled 2016-04-18: qty 5

## 2016-04-18 MED ORDER — ROSUVASTATIN CALCIUM 5 MG PO TABS
10.0000 mg | ORAL_TABLET | Freq: Every day | ORAL | Status: DC
  Administered 2016-04-18 – 2016-04-21 (×4): 10 mg via ORAL
  Filled 2016-04-18 (×4): qty 2

## 2016-04-18 MED ORDER — SUGAMMADEX SODIUM 200 MG/2ML IV SOLN
INTRAVENOUS | Status: AC
  Filled 2016-04-18: qty 2

## 2016-04-18 MED ORDER — PREGABALIN 100 MG PO CAPS
100.0000 mg | ORAL_CAPSULE | Freq: Two times a day (BID) | ORAL | Status: DC
  Administered 2016-04-18 – 2016-04-22 (×8): 100 mg via ORAL
  Filled 2016-04-18: qty 1
  Filled 2016-04-18: qty 2
  Filled 2016-04-18 (×5): qty 1
  Filled 2016-04-18: qty 2

## 2016-04-18 MED ORDER — CHLORHEXIDINE GLUCONATE CLOTH 2 % EX PADS: 6.0000 | MEDICATED_PAD | Freq: Once | CUTANEOUS | Status: DC

## 2016-04-18 MED ORDER — ACETAMINOPHEN 325 MG PO TABS: 650.0000 mg | ORAL_TABLET | ORAL | Status: DC | PRN

## 2016-04-18 MED ORDER — PROPOFOL 500 MG/50ML IV EMUL
INTRAVENOUS | Status: DC | PRN
  Administered 2016-04-18: 50 ug/kg/min via INTRAVENOUS

## 2016-04-18 MED ORDER — SUFENTANIL CITRATE 50 MCG/ML IV SOLN
INTRAVENOUS | Status: AC
  Filled 2016-04-18: qty 1

## 2016-04-18 MED ORDER — VITAMIN C 500 MG PO TABS: 500.0000 mg | ORAL_TABLET | Freq: Every day | ORAL | Status: DC

## 2016-04-18 MED ORDER — BUPIVACAINE HCL (PF) 0.5 % IJ SOLN
INTRAMUSCULAR | Status: DC | PRN
  Administered 2016-04-18: 6.5 mL
  Administered 2016-04-18: 5 mL

## 2016-04-18 MED ORDER — SENNOSIDES-DOCUSATE SODIUM 8.6-50 MG PO TABS
1.0000 | ORAL_TABLET | Freq: Every evening | ORAL | Status: DC | PRN
  Administered 2016-04-20: 1 via ORAL
  Filled 2016-04-18: qty 1

## 2016-04-18 MED ORDER — VANCOMYCIN HCL IN DEXTROSE 1-5 GM/200ML-% IV SOLN
1000.0000 mg | Freq: Once | INTRAVENOUS | Status: AC
  Administered 2016-04-18: 1000 mg via INTRAVENOUS
  Filled 2016-04-18: qty 200

## 2016-04-18 MED ORDER — DOCUSATE SODIUM 100 MG PO CAPS
100.0000 mg | ORAL_CAPSULE | Freq: Two times a day (BID) | ORAL | Status: DC
  Administered 2016-04-18 – 2016-04-22 (×8): 100 mg via ORAL
  Filled 2016-04-18 (×8): qty 1

## 2016-04-18 MED ORDER — 0.9 % SODIUM CHLORIDE (POUR BTL) OPTIME
TOPICAL | Status: DC | PRN
  Administered 2016-04-18: 1000 mL

## 2016-04-18 MED ORDER — BISACODYL 10 MG RE SUPP
10.0000 mg | Freq: Every day | RECTAL | Status: DC | PRN
  Administered 2016-04-22: 10 mg via RECTAL
  Filled 2016-04-18: qty 1

## 2016-04-18 MED ORDER — CARVEDILOL 3.125 MG PO TABS
3.1250 mg | ORAL_TABLET | Freq: Two times a day (BID) | ORAL | Status: DC
  Administered 2016-04-18 – 2016-04-22 (×5): 3.125 mg via ORAL
  Filled 2016-04-18 (×5): qty 1

## 2016-04-18 MED ORDER — ASPIRIN EC 81 MG PO TBEC
81.0000 mg | DELAYED_RELEASE_TABLET | Freq: Every day | ORAL | Status: DC
  Administered 2016-04-19 – 2016-04-22 (×4): 81 mg via ORAL
  Filled 2016-04-18 (×4): qty 1

## 2016-04-18 MED ORDER — ZOLPIDEM TARTRATE 5 MG PO TABS: 5.0000 mg | ORAL_TABLET | Freq: Every evening | ORAL | Status: DC | PRN

## 2016-04-18 MED ORDER — FERROUS SULFATE 325 (65 FE) MG PO TABS
325.0000 mg | ORAL_TABLET | Freq: Every day | ORAL | Status: DC
  Administered 2016-04-19 – 2016-04-22 (×4): 325 mg via ORAL
  Filled 2016-04-18 (×4): qty 1

## 2016-04-18 MED ORDER — DEXTROSE 5 % IV SOLN
500.0000 mg | Freq: Four times a day (QID) | INTRAVENOUS | Status: DC | PRN
  Filled 2016-04-18: qty 5

## 2016-04-18 MED ORDER — EPHEDRINE 5 MG/ML INJ
INTRAVENOUS | Status: AC
  Filled 2016-04-18: qty 10

## 2016-04-18 MED ORDER — PHENYLEPHRINE 40 MCG/ML (10ML) SYRINGE FOR IV PUSH (FOR BLOOD PRESSURE SUPPORT)
PREFILLED_SYRINGE | INTRAVENOUS | Status: AC
  Filled 2016-04-18: qty 10

## 2016-04-18 MED ORDER — ONDANSETRON HCL 4 MG/2ML IJ SOLN
4.0000 mg | INTRAMUSCULAR | Status: DC | PRN
  Administered 2016-04-19: 4 mg via INTRAVENOUS
  Filled 2016-04-18: qty 2

## 2016-04-18 MED ORDER — METHOCARBAMOL 500 MG PO TABS
500.0000 mg | ORAL_TABLET | Freq: Four times a day (QID) | ORAL | Status: DC | PRN
  Administered 2016-04-18 – 2016-04-22 (×7): 500 mg via ORAL
  Filled 2016-04-18 (×7): qty 1

## 2016-04-18 MED ORDER — ARTIFICIAL TEARS OP OINT
TOPICAL_OINTMENT | OPHTHALMIC | Status: AC
  Filled 2016-04-18: qty 3.5

## 2016-04-18 MED ORDER — LIDOCAINE-EPINEPHRINE 1 %-1:100000 IJ SOLN
INTRAMUSCULAR | Status: AC
  Filled 2016-04-18: qty 1

## 2016-04-18 MED ORDER — SUCCINYLCHOLINE CHLORIDE 20 MG/ML IJ SOLN
INTRAMUSCULAR | Status: DC | PRN
  Administered 2016-04-18: 100 mg via INTRAVENOUS
  Administered 2016-04-18: 40 mg via INTRAVENOUS

## 2016-04-18 MED ORDER — PANTOPRAZOLE SODIUM 40 MG IV SOLR
40.0000 mg | Freq: Every day | INTRAVENOUS | Status: DC
  Administered 2016-04-18: 40 mg via INTRAVENOUS

## 2016-04-18 MED ORDER — BUPIVACAINE LIPOSOME 1.3 % IJ SUSP
20.0000 mL | INTRAMUSCULAR | Status: AC
  Administered 2016-04-18: 20 mL
  Filled 2016-04-18: qty 20

## 2016-04-18 MED ORDER — PROPOFOL 500 MG/50ML IV EMUL
INTRAVENOUS | Status: AC
  Filled 2016-04-18: qty 200

## 2016-04-18 MED ORDER — HYDROMORPHONE HCL 1 MG/ML IJ SOLN
INTRAMUSCULAR | Status: AC
  Filled 2016-04-18: qty 1

## 2016-04-18 MED ORDER — KCL IN DEXTROSE-NACL 20-5-0.45 MEQ/L-%-% IV SOLN: INTRAVENOUS | Status: DC

## 2016-04-18 MED ORDER — HYDROCODONE-ACETAMINOPHEN 5-325 MG PO TABS: 1.0000 | ORAL_TABLET | ORAL | Status: DC | PRN

## 2016-04-18 MED ORDER — ONDANSETRON HCL 4 MG/2ML IJ SOLN
INTRAMUSCULAR | Status: DC | PRN
  Administered 2016-04-18: 4 mg via INTRAVENOUS

## 2016-04-18 MED ORDER — ACETAMINOPHEN 325 MG PO TABS: 325.0000 mg | ORAL_TABLET | Freq: Four times a day (QID) | ORAL | Status: DC | PRN

## 2016-04-18 MED ORDER — MENTHOL 3 MG MT LOZG
1.0000 | LOZENGE | OROMUCOSAL | Status: DC | PRN
Start: 2016-04-18 — End: 2016-04-22

## 2016-04-18 MED ORDER — DEXAMETHASONE SODIUM PHOSPHATE 10 MG/ML IJ SOLN
INTRAMUSCULAR | Status: DC | PRN
  Administered 2016-04-18: 10 mg via INTRAVENOUS

## 2016-04-18 MED ORDER — CLOTRIMAZOLE 1 % EX CREA
1.0000 "application " | TOPICAL_CREAM | Freq: Two times a day (BID) | CUTANEOUS | Status: DC | PRN
  Filled 2016-04-18: qty 15

## 2016-04-18 MED ORDER — LIDOCAINE HCL (CARDIAC) 20 MG/ML IV SOLN
INTRAVENOUS | Status: DC | PRN
  Administered 2016-04-18: 60 mg via INTRAVENOUS

## 2016-04-18 MED ORDER — THROMBIN 5000 UNITS EX SOLR
CUTANEOUS | Status: DC | PRN
  Administered 2016-04-18 (×2): 5000 [IU] via TOPICAL

## 2016-04-18 MED ORDER — FERROUS FUM-IRON POLYSACCH-FA 162-115.2-1 MG PO CAPS: 1.0000 | ORAL_CAPSULE | Freq: Every day | ORAL | Status: DC

## 2016-04-18 MED ORDER — PHENOL 1.4 % MT LIQD
1.0000 | OROMUCOSAL | Status: DC | PRN
Start: 2016-04-18 — End: 2016-04-22

## 2016-04-18 MED ORDER — FLEET ENEMA 7-19 GM/118ML RE ENEM: 1.0000 | ENEMA | Freq: Once | RECTAL | Status: DC | PRN

## 2016-04-18 MED ORDER — HEMOSTATIC AGENTS (NO CHARGE) OPTIME
TOPICAL | Status: DC | PRN
  Administered 2016-04-18: 1 via TOPICAL

## 2016-04-18 MED ORDER — ONDANSETRON HCL 4 MG/2ML IJ SOLN
INTRAMUSCULAR | Status: AC
  Filled 2016-04-18: qty 2

## 2016-04-18 MED ORDER — ACETAMINOPHEN 650 MG RE SUPP: 650.0000 mg | RECTAL | Status: DC | PRN

## 2016-04-18 MED ORDER — SODIUM CHLORIDE 0.9% FLUSH: 3.0000 mL | INTRAVENOUS | Status: DC | PRN

## 2016-04-18 MED ORDER — EZETIMIBE 10 MG PO TABS
10.0000 mg | ORAL_TABLET | Freq: Every day | ORAL | Status: DC
  Administered 2016-04-18 – 2016-04-22 (×5): 10 mg via ORAL
  Filled 2016-04-18 (×5): qty 1

## 2016-04-18 MED ORDER — GLYCOPYRROLATE 0.2 MG/ML IV SOSY
PREFILLED_SYRINGE | INTRAVENOUS | Status: AC
  Filled 2016-04-18: qty 6

## 2016-04-18 MED ORDER — ALUM & MAG HYDROXIDE-SIMETH 200-200-20 MG/5ML PO SUSP
30.0000 mL | Freq: Four times a day (QID) | ORAL | Status: DC | PRN
  Administered 2016-04-20 (×2): 30 mL via ORAL
  Filled 2016-04-18 (×2): qty 30

## 2016-04-18 MED ORDER — COENZYME Q-10 60 MG PO CAPS: 1000.0000 mg | ORAL_CAPSULE | Freq: Every day | ORAL | Status: DC

## 2016-04-18 MED ORDER — ACETAMINOPHEN 10 MG/ML IV SOLN
INTRAVENOUS | Status: AC
  Filled 2016-04-18: qty 100

## 2016-04-18 MED ORDER — DOXYCYCLINE HYCLATE 100 MG PO TABS
100.0000 mg | ORAL_TABLET | Freq: Two times a day (BID) | ORAL | Status: DC
  Administered 2016-04-18 – 2016-04-22 (×8): 100 mg via ORAL
  Filled 2016-04-18 (×9): qty 1

## 2016-04-18 MED ORDER — OMEPRAZOLE MAGNESIUM 20 MG PO TBEC: 20.0000 mg | DELAYED_RELEASE_TABLET | Freq: Every day | ORAL | Status: DC

## 2016-04-18 MED ORDER — SODIUM CHLORIDE 0.9% FLUSH
3.0000 mL | Freq: Two times a day (BID) | INTRAVENOUS | Status: DC
  Administered 2016-04-19 – 2016-04-21 (×4): 3 mL via INTRAVENOUS
  Administered 2016-04-21: 10 mL via INTRAVENOUS

## 2016-04-18 MED ORDER — ACETAMINOPHEN 10 MG/ML IV SOLN
INTRAVENOUS | Status: DC | PRN
  Administered 2016-04-18: 1000 mg via INTRAVENOUS

## 2016-04-18 MED ORDER — OXYCODONE-ACETAMINOPHEN 5-325 MG PO TABS
1.0000 | ORAL_TABLET | ORAL | Status: DC | PRN
Start: 2016-04-18 — End: 2016-04-22
  Administered 2016-04-18 – 2016-04-19 (×7): 2 via ORAL
  Administered 2016-04-20: 1 via ORAL
  Administered 2016-04-20: 2 via ORAL
  Administered 2016-04-20: 1 via ORAL
  Administered 2016-04-20: 2 via ORAL
  Administered 2016-04-20 – 2016-04-21 (×4): 1 via ORAL
  Administered 2016-04-22 (×2): 2 via ORAL
  Filled 2016-04-18: qty 2
  Filled 2016-04-18 (×2): qty 1
  Filled 2016-04-18: qty 2
  Filled 2016-04-18 (×2): qty 1
  Filled 2016-04-18 (×3): qty 2
  Filled 2016-04-18 (×2): qty 1
  Filled 2016-04-18 (×5): qty 2
  Filled 2016-04-18: qty 1
  Filled 2016-04-18: qty 2

## 2016-04-18 MED ORDER — SODIUM CHLORIDE 0.9 % IV SOLN: 250.0000 mL | INTRAVENOUS | Status: DC

## 2016-04-18 MED ORDER — SODIUM CHLORIDE 0.9 % IJ SOLN
INTRAMUSCULAR | Status: AC
  Filled 2016-04-18: qty 10

## 2016-04-18 MED ORDER — PROPOFOL 10 MG/ML IV BOLUS
INTRAVENOUS | Status: AC
  Filled 2016-04-18: qty 20

## 2016-04-18 MED ORDER — EPHEDRINE SULFATE 50 MG/ML IJ SOLN
INTRAMUSCULAR | Status: DC | PRN
  Administered 2016-04-18: 5 mg via INTRAVENOUS
  Administered 2016-04-18: 10 mg via INTRAVENOUS

## 2016-04-18 MED ORDER — THROMBIN 5000 UNITS EX SOLR
CUTANEOUS | Status: AC
  Filled 2016-04-18: qty 10000

## 2016-04-18 MED ORDER — PROPOFOL 10 MG/ML IV BOLUS
INTRAVENOUS | Status: DC | PRN
  Administered 2016-04-18: 30 mg via INTRAVENOUS
  Administered 2016-04-18: 110 mg via INTRAVENOUS

## 2016-04-18 MED ORDER — DEXAMETHASONE SODIUM PHOSPHATE 10 MG/ML IJ SOLN
INTRAMUSCULAR | Status: AC
  Filled 2016-04-18: qty 1

## 2016-04-18 MED ORDER — PHENYLEPHRINE HCL 10 MG/ML IJ SOLN
INTRAMUSCULAR | Status: DC | PRN
  Administered 2016-04-18: 50 ug/min via INTRAVENOUS

## 2016-04-18 SURGICAL SUPPLY — 61 items
ADH SKN CLS APL DERMABOND .7 (GAUZE/BANDAGES/DRESSINGS) ×6
BLADE CLIPPER SURG (BLADE) ×3 IMPLANT
CAGE COROENT XL WIDE 14X22X60M (Cage) ×3 IMPLANT
CAGE COROENT XL WIDE14X22X55 (Cage) ×3 IMPLANT
CLIP NEUROVISION LG (CLIP) ×3 IMPLANT
DERMABOND ADVANCED (GAUZE/BANDAGES/DRESSINGS) ×3
DERMABOND ADVANCED .7 DNX12 (GAUZE/BANDAGES/DRESSINGS) ×6 IMPLANT
DRAPE C-ARM 42X72 X-RAY (DRAPES) ×3 IMPLANT
DRAPE C-ARMOR (DRAPES) ×3 IMPLANT
DRAPE LAPAROTOMY 100X72X124 (DRAPES) IMPLANT
DRAPE POUCH INSTRU U-SHP 10X18 (DRAPES) ×6 IMPLANT
DRSG OPSITE POSTOP 3X4 (GAUZE/BANDAGES/DRESSINGS) ×3 IMPLANT
DRSG OPSITE POSTOP 4X6 (GAUZE/BANDAGES/DRESSINGS) ×9 IMPLANT
DURAPREP 26ML APPLICATOR (WOUND CARE) ×6 IMPLANT
ELECT REM PT RETURN 9FT ADLT (ELECTROSURGICAL) ×6
ELECTRODE REM PT RTRN 9FT ADLT (ELECTROSURGICAL) ×4 IMPLANT
GAUZE SPONGE 4X4 16PLY XRAY LF (GAUZE/BANDAGES/DRESSINGS) ×3 IMPLANT
GLOVE BIO SURGEON STRL SZ8 (GLOVE) ×6 IMPLANT
GLOVE BIOGEL PI IND STRL 7.0 (GLOVE) ×6 IMPLANT
GLOVE BIOGEL PI IND STRL 7.5 (GLOVE) ×8 IMPLANT
GLOVE BIOGEL PI IND STRL 8 (GLOVE) ×4 IMPLANT
GLOVE BIOGEL PI IND STRL 8.5 (GLOVE) ×4 IMPLANT
GLOVE BIOGEL PI INDICATOR 7.0 (GLOVE) ×3
GLOVE BIOGEL PI INDICATOR 7.5 (GLOVE) ×4
GLOVE BIOGEL PI INDICATOR 8 (GLOVE) ×2
GLOVE BIOGEL PI INDICATOR 8.5 (GLOVE) ×2
GLOVE ECLIPSE 7.0 STRL STRAW (GLOVE) ×3 IMPLANT
GLOVE ECLIPSE 8.0 STRL XLNG CF (GLOVE) ×6 IMPLANT
GLOVE SS N UNI LF 6.5 STRL (GLOVE) ×6 IMPLANT
GLOVE SS N UNI LF 7.0 STRL (GLOVE) ×6 IMPLANT
GOWN STRL REUS W/ TWL LRG LVL3 (GOWN DISPOSABLE) ×4 IMPLANT
GOWN STRL REUS W/ TWL XL LVL3 (GOWN DISPOSABLE) ×8 IMPLANT
GOWN STRL REUS W/TWL 2XL LVL3 (GOWN DISPOSABLE) ×6 IMPLANT
GOWN STRL REUS W/TWL LRG LVL3 (GOWN DISPOSABLE) ×6
GOWN STRL REUS W/TWL XL LVL3 (GOWN DISPOSABLE) ×12
GUIDEWIRE NITINOL BEVEL TIP (WIRE) ×18 IMPLANT
KIT BASIN OR (CUSTOM PROCEDURE TRAY) ×6 IMPLANT
KIT DILATOR XLIF 5 (KITS) ×2 IMPLANT
KIT INFUSE X SMALL 1.4CC (Orthopedic Implant) ×3 IMPLANT
KIT ROOM TURNOVER OR (KITS) ×3 IMPLANT
KIT SURGICAL ACCESS MAXCESS 4 (KITS) ×3 IMPLANT
KIT XLIF (KITS) ×1
MODULE NVM5 NEXT GEN EMG (NEEDLE) ×3 IMPLANT
NEEDLE HYPO 25X1 1.5 SAFETY (NEEDLE) ×3 IMPLANT
NEEDLE I PASS (NEEDLE) ×3 IMPLANT
NS IRRIG 1000ML POUR BTL (IV SOLUTION) ×3 IMPLANT
PACK LAMINECTOMY NEURO (CUSTOM PROCEDURE TRAY) ×6 IMPLANT
PUTTY BONE ATTRAX 10CC STRIP (Putty) ×3 IMPLANT
ROD RELINE TI MAS 5.5X70MM LD (Rod) ×6 IMPLANT
SCREW LOCK RELINE 5.5 TULIP (Screw) ×18 IMPLANT
SCREW MAS RELINE 6.5X45 POLY (Screw) ×15 IMPLANT
SCREW MAS RELINE POLY 6.5X40 (Screw) ×3 IMPLANT
STAPLER SKIN PROX WIDE 3.9 (STAPLE) ×3 IMPLANT
SUT VIC AB 1 CT1 18XBRD ANBCTR (SUTURE) ×4 IMPLANT
SUT VIC AB 1 CT1 8-18 (SUTURE) ×6
SUT VIC AB 2-0 CT1 18 (SUTURE) ×6 IMPLANT
SUT VIC AB 3-0 SH 8-18 (SUTURE) ×6 IMPLANT
TOWEL OR 17X24 6PK STRL BLUE (TOWEL DISPOSABLE) ×3 IMPLANT
TOWEL OR 17X26 10 PK STRL BLUE (TOWEL DISPOSABLE) ×3 IMPLANT
TRAY FOLEY W/METER SILVER 16FR (SET/KITS/TRAYS/PACK) ×3 IMPLANT
WATER STERILE IRR 1000ML POUR (IV SOLUTION) ×3 IMPLANT

## 2016-04-18 NOTE — Progress Notes (Signed)
Pharmacy Antibiotic Note  [name] is a 75 y.o. male admitted on 04/18/2016 for a planned lumbar surgery.  Pharmacy has been consulted for Vancomycin x 1 dose post-op for surgical prophylaxis.  The patient received 1 dose of Vancomycin 1500 mg at 0630 this AM. Wt: 110 kg, SCr 1.27 (11/13), CrCl~50-60 ml/min (normalized)  Plan: 1. Vancomycin 1g IV x 1 dose at 1830 this evening (~12 hours after pre-op dose) 2. Pharmacy will sign off as no further doses expected at this time.   Weight: 244 lb (110.7 kg)  Temp (24hrs), Avg:97.9 F (36.6 C), Min:97.6 F (36.4 C), Max:98.3 F (36.8 C)   Recent Labs Lab 04/15/16 1012  WBC 7.4  CREATININE 1.27*    Estimated Creatinine Clearance: 60.7 mL/min (by C-G formula based on SCr of 1.27 mg/dL (H)).    Allergies  Allergen Reactions  . Atorvastatin Other (See Comments)    unknown  . Compazine [Prochlorperazine Edisylate] Other (See Comments)    Involuntary muscle movement   . Gemfibrozil Other (See Comments)    unknown  . Monascus Purpureus Went Yeast   . Niacin And Related     Flushing  . Rosuvastatin     Failed Crestor tiw due to leg aches (tolerated Crestor qweek and biw)  . Welchol [Colesevelam Hcl]     Leg aches  . Penicillins Nausea And Vomiting and Rash    Has patient had a PCN reaction causing immediate rash, facial/tongue/throat swelling, SOB or lightheadedness with hypotension: Yes Has patient had a PCN reaction causing severe rash involving mucus membranes or skin necrosis: No Has patient had a PCN reaction that required hospitalization No Has patient had a PCN reaction occurring within the last 10 years: No If all of the above answers are "NO", then may proceed with Cephalosporin use.      Lawson Radar 04/18/2016 4:42 PM

## 2016-04-18 NOTE — Interval H&P Note (Signed)
History and Physical Interval Note:  04/18/2016 8:18 AM  [name]  has presented today for surgery, with the diagnosis of LUMBAR FORAMINAL STENOSIS  The various methods of treatment have been discussed with the patient and family. After consideration of risks, benefits and other options for treatment, the patient has consented to  Procedure(s) with comments: RIGHT L3-4 L4-5 ANTEROLATERAL LUMBAR INTERBODY FUSION (Right) - RIGHT L3-4 L4-5 ANTEROLATERAL LUMBAR INTERBODY FUSION LUMBAR PERCUTANEOUS PEDICLE SCREW 2 LEVEL (N/A) as a surgical intervention .  [name] history has been reviewed, patient examined, no change in status, stable for surgery.  I have reviewed the patient's chart and labs.  Questions were answered to the patient's satisfaction.     [name] D

## 2016-04-18 NOTE — Anesthesia Procedure Notes (Signed)
Procedure Name: Intubation Date/Time: 04/18/2016 7:36 AM Performed by: Izora Gala Pre-anesthesia Checklist: Patient identified, Emergency Drugs available, Suction available and Patient being monitored Patient Re-evaluated:Patient Re-evaluated prior to inductionOxygen Delivery Method: Circle system utilized Preoxygenation: Pre-oxygenation with 100% oxygen Intubation Type: IV induction Ventilation: Mask ventilation without difficulty Laryngoscope Size: Miller and 3 Grade View: Grade I Tube type: Oral Tube size: 7.5 mm Airway Equipment and Method: Stylet and LTA kit utilized Placement Confirmation: ETT inserted through vocal cords under direct vision,  positive ETCO2 and breath sounds checked- equal and bilateral Secured at: 22 cm Tube secured with: Tape Dental Injury: Teeth and Oropharynx as per pre-operative assessment

## 2016-04-18 NOTE — Anesthesia Postprocedure Evaluation (Signed)
Anesthesia Post Note  Patient: CASHDEN HORNBEAK  Procedure(s) Performed: Procedure(s) (LRB): RIGHT LUMBAR THREE-FOUR, LUMBAR FOUR-FIVE ANTEROLATERAL LUMBAR INTERBODY FUSION (Right) LUMBAR THREE-FOUR, LUMBAR FOUR-FIVE PERCUTANEOUS PEDICLE SCREW (N/A)  Patient location during evaluation: PACU Anesthesia Type: General Level of consciousness: awake and alert Pain management: pain level controlled Vital Signs Assessment: post-procedure vital signs reviewed and stable Respiratory status: spontaneous breathing, nonlabored ventilation, respiratory function stable and patient connected to nasal cannula oxygen Cardiovascular status: blood pressure returned to baseline and stable Postop Assessment: no signs of nausea or vomiting Anesthetic complications: no    Last Vitals:  Vitals:   04/18/16 1448 04/18/16 1503  BP: (!) 144/76 134/81  Pulse: 65 (!) 56  Resp: 18 12  Temp:      Last Pain:  Vitals:   04/18/16 1503  PainSc: Asleep    LLE Motor Response: Purposeful movement;Responds to commands (04/18/16 1503) LLE Sensation: Tingling (04/18/16 1503) RLE Motor Response: Purposeful movement;Responds to commands (04/18/16 1503) RLE Sensation: Tingling (04/18/16 1503)      Domenick Quebedeaux,W. EDMOND

## 2016-04-18 NOTE — Op Note (Signed)
04/18/2016  12:44 PM  PATIENT:  [name]  75 y.o. male  PRE-OPERATIVE DIAGNOSIS:  LUMBAR FORAMINAL STENOSIS, facet fracture with instability, spondylolisthesis, herniated lumbar disc, radiculopathy, lumbago L 34 and L 45 levels  POST-OPERATIVE DIAGNOSIS:  LUMBAR FORAMINAL STENOSIS, facet fracture with instability, spondylolisthesis, herniated lumbar disc, radiculopathy, lumbago L 34 and L 45 levels  PROCEDURE:  Procedure(s) with comments: RIGHT LUMBAR THREE-FOUR, LUMBAR FOUR-FIVE ANTEROLATERAL LUMBAR INTERBODY FUSION (Right) - RIGHT L3-4 L4-5 ANTEROLATERAL LUMBAR INTERBODY FUSION LUMBAR THREE-FOUR, LUMBAR FOUR-FIVE PERCUTANEOUS PEDICLE SCREW (N/A)  SURGEON:  Surgeon(s) and Role:    * Erline Levine, MD - Primary    * Consuella Lose, MD - Assisting  PHYSICIAN ASSISTANT:   ASSISTANTS: Poteat, RN   ANESTHESIA:   general  EBL:  Total I/O In: 2000 [I.V.:1500; IV Piggyback:500] Out: [id number] [Urine:700; Blood:75]  BLOOD ADMINISTERED:none  DRAINS: none   LOCAL MEDICATIONS USED:  MARCAINE    and LIDOCAINE   SPECIMEN:  No Specimen  DISPOSITION OF SPECIMEN:  N/A  COUNTS:  YES  TOURNIQUET:  * No tourniquets in log *  DICTATION: DICTATION: Patient is a 75 year old with severe spondylosis stenosis and herniated lumbar disc with left L34 facet fracture and spondylolisthesis of the lumbar spine. It was elected to take him to surgery for anterolateral decompression and posterior pedicle screw fixation at the L 34 and L 45 levels.    Procedure: Patient was brought to the operating room and placed in a left lateral decubitus position on the operative table and using orthogonally projected C-arm fluoroscopy the patient was placed so that the L3-4 and L 45 levels were visualized in AP and lateral plane. The patient was then taped into position. The table was flexed so as to expose the L 45 level as the patient has a high iliac crest. Skin was marked along with a posterior finger dissection  incision. His flank was then prepped and draped in usual sterile fashion and incisions were made sequentially at L 45 and  L 34 levels. Posterior finger dissection was made to enter the retroperitoneal space and then subsequently the probe was inserted into the psoas muscle from the right side initially at the L 45 level. After mapping the neural elements were able to dock the probe at the posterior third of this vertebral level and without indications electrically of too close proximity to the neural tissues. Subsequently the self-retaining tractor was.after sequential dilators were utilized the shim was employed and the interspace was cleared of psoas muscle and then incised. A thorough discectomy was performed. Angled instruments were used to clear the interspace of disc material.An anterior entry with posterior trajectory was performed to avoid neural elements.   After thorough discectomy was performed and this was performed using AP and lateral fluoroscopy a 14 lordotic by 60 x 22 mm x 10 degree implant was packed with extra extra small BMP and Attrax. This was tamped into position using the slides and its position was confirmed on AP and lateral fluoroscopy. Subsequently exposure was performed at the L3-4 level and similar dissection was performed with locking of the self-retaining retractor. At this level were able to place a 14 x 22 61mm 10 degree lordotic implant packed in a similar fashion.  Hemostasis was assured the wounds were irrigated and closed with interrupted Vicryl sutures.  Sterile occlusive dressings were placed. Retractor times were:  L 45: 28 minutes;  L 34:  29 minutes.   Patient was then turned into a prone position on  the operating table on Jackson table and using AP and lateral fluoroscopy throughout this portion of the procedure, pedicle screws were placed using Reline Nuvasive cannulated percutaneous screws. 2 screws were placed at L3 and (6.5 x 45 mm left and 6.5 x 40 mm right) and  two at L4 (6.5x 45 mm),  2 at L5 of a similar size. Care was taken to avoid the total laminectomy defect. 70 mm rod was then affixed to the screw heads and locked down on the screws on the left and 70 mm rod on the right. All connections were then torqued and the Towers were disassembled. The wounds were irrigated and then closed with 1, 2-0 and 3-0 Vicryl stitches. Sterile occlusive dressing was placed with Dermabond. Long-acting Marcaine was injected. The patient was then extubated in the operating room and taken to recovery in stable and satisfactory condition having tolerated his operation well. Counts were correct at the end of the case.  Pelvic Parameters:  Preop:  PI 63; LL66; PI-LL -3  PLAN OF CARE: Admit to inpatient   PATIENT DISPOSITION:  PACU - hemodynamically stable.   Delay start of Pharmacological VTE agent (>24hrs) due to surgical blood loss or risk of bleeding: yes

## 2016-04-18 NOTE — Progress Notes (Signed)
Awake, alert, conversant.  MAEW with good strength.  Doing well. 

## 2016-04-18 NOTE — Transfer of Care (Signed)
Immediate Anesthesia Transfer of Care Note  Patient: [name]  Procedure(s) Performed: Procedure(s) with comments: RIGHT LUMBAR THREE-FOUR, LUMBAR FOUR-FIVE ANTEROLATERAL LUMBAR INTERBODY FUSION (Right) - RIGHT L3-4 L4-5 ANTEROLATERAL LUMBAR INTERBODY FUSION LUMBAR THREE-FOUR, LUMBAR FOUR-FIVE PERCUTANEOUS PEDICLE SCREW (N/A)  Patient Location: PACU  Anesthesia Type:General  Level of Consciousness: awake, sedated and patient cooperative  Airway & Oxygen Therapy: Patient Spontanous Breathing and Patient connected to nasal cannula oxygen  Post-op Assessment: Report given to RN, Post -op Vital signs reviewed and stable, Patient moving all extremities and Patient moving all extremities X 4  Post vital signs: Reviewed and stable  Last Vitals:  Vitals:   04/18/16 0547 04/18/16 1303  BP: 130/76   Pulse: (!) 56   Resp: 20   Temp: 36.8 C (P) 36.4 C    Last Pain: There were no vitals filed for this visit.       Complications: No apparent anesthesia complications

## 2016-04-18 NOTE — Brief Op Note (Signed)
04/18/2016  12:44 PM  PATIENT:  [name]  75 y.o. male  PRE-OPERATIVE DIAGNOSIS:  LUMBAR FORAMINAL STENOSIS, facet fracture with instability, spondylolisthesis, herniated lumbar disc, radiculopathy, lumbago L 34 and L 45 levels  POST-OPERATIVE DIAGNOSIS:  LUMBAR FORAMINAL STENOSIS, facet fracture with instability, spondylolisthesis, herniated lumbar disc, radiculopathy, lumbago L 34 and L 45 levels  PROCEDURE:  Procedure(s) with comments: RIGHT LUMBAR THREE-FOUR, LUMBAR FOUR-FIVE ANTEROLATERAL LUMBAR INTERBODY FUSION (Right) - RIGHT L3-4 L4-5 ANTEROLATERAL LUMBAR INTERBODY FUSION LUMBAR THREE-FOUR, LUMBAR FOUR-FIVE PERCUTANEOUS PEDICLE SCREW (N/A)  SURGEON:  Surgeon(s) and Role:    * Erline Levine, MD - Primary    * Consuella Lose, MD - Assisting  PHYSICIAN ASSISTANT:   ASSISTANTS: Poteat, RN   ANESTHESIA:   general  EBL:  Total I/O In: 2000 [I.V.:1500; IV Piggyback:500] Out: [id number] [Urine:700; Blood:75]  BLOOD ADMINISTERED:none  DRAINS: none   LOCAL MEDICATIONS USED:  MARCAINE    and LIDOCAINE   SPECIMEN:  No Specimen  DISPOSITION OF SPECIMEN:  N/A  COUNTS:  YES  TOURNIQUET:  * No tourniquets in log *  DICTATION: DICTATION: Patient is a 75 year old with severe spondylosis stenosis and herniated lumbar disc with left L34 facet fracture and spondylolisthesis of the lumbar spine. It was elected to take him to surgery for anterolateral decompression and posterior pedicle screw fixation at the L 34 and L 45 levels.    Procedure: Patient was brought to the operating room and placed in a left lateral decubitus position on the operative table and using orthogonally projected C-arm fluoroscopy the patient was placed so that the L3-4 and L 45 levels were visualized in AP and lateral plane. The patient was then taped into position. The table was flexed so as to expose the L 45 level as the patient has a high iliac crest. Skin was marked along with a posterior finger dissection  incision. His flank was then prepped and draped in usual sterile fashion and incisions were made sequentially at L 45 and  L 34 levels. Posterior finger dissection was made to enter the retroperitoneal space and then subsequently the probe was inserted into the psoas muscle from the right side initially at the L 45 level. After mapping the neural elements were able to dock the probe at the posterior third of this vertebral level and without indications electrically of too close proximity to the neural tissues. Subsequently the self-retaining tractor was.after sequential dilators were utilized the shim was employed and the interspace was cleared of psoas muscle and then incised. A thorough discectomy was performed. Angled instruments were used to clear the interspace of disc material.An anterior entry with posterior trajectory was performed to avoid neural elements.   After thorough discectomy was performed and this was performed using AP and lateral fluoroscopy a 14 lordotic by 60 x 22 mm x 10 degree implant was packed with extra extra small BMP and Attrax. This was tamped into position using the slides and its position was confirmed on AP and lateral fluoroscopy. Subsequently exposure was performed at the L3-4 level and similar dissection was performed with locking of the self-retaining retractor. At this level were able to place a 14 x 22 25mm 10 degree lordotic implant packed in a similar fashion.  Hemostasis was assured the wounds were irrigated and closed with interrupted Vicryl sutures.  Sterile occlusive dressings were placed. Retractor times were:  L 45: 28 minutes;  L 34:  29 minutes.   Patient was then turned into a prone position on  the operating table on Jackson table and using AP and lateral fluoroscopy throughout this portion of the procedure, pedicle screws were placed using Reline Nuvasive cannulated percutaneous screws. 2 screws were placed at L3 and (6.5 x 45 mm left and 6.5 x 40 mm right) and  two at L4 (6.5x 45 mm),  2 at L5 of a similar size. Care was taken to avoid the total laminectomy defect. 70 mm rod was then affixed to the screw heads and locked down on the screws on the left and 70 mm rod on the right. All connections were then torqued and the Towers were disassembled. The wounds were irrigated and then closed with 1, 2-0 and 3-0 Vicryl stitches. Sterile occlusive dressing was placed with Dermabond. Long-acting Marcaine was injected. The patient was then extubated in the operating room and taken to recovery in stable and satisfactory condition having tolerated his operation well. Counts were correct at the end of the case.  Pelvic Parameters:  Preop:  PI 63; LL66; PI-LL -3  PLAN OF CARE: Admit to inpatient   PATIENT DISPOSITION:  PACU - hemodynamically stable.   Delay start of Pharmacological VTE agent (>24hrs) due to surgical blood loss or risk of bleeding: yes

## 2016-04-19 LAB — GLUCOSE, CAPILLARY: Glucose-Capillary: 129 mg/dL — ABNORMAL HIGH (ref 65–99)

## 2016-04-19 MED ORDER — PANTOPRAZOLE SODIUM 40 MG PO TBEC
40.0000 mg | DELAYED_RELEASE_TABLET | Freq: Every day | ORAL | Status: DC
  Administered 2016-04-19 – 2016-04-21 (×3): 40 mg via ORAL
  Filled 2016-04-19 (×3): qty 1

## 2016-04-19 MED ORDER — OXYCODONE-ACETAMINOPHEN 5-325 MG PO TABS: 1.0000 | ORAL_TABLET | ORAL | 0 refills | Status: DC | PRN

## 2016-04-19 MED ORDER — METHOCARBAMOL 500 MG PO TABS: 500.0000 mg | ORAL_TABLET | Freq: Four times a day (QID) | ORAL | 1 refills | Status: DC | PRN

## 2016-04-19 NOTE — Progress Notes (Signed)
Subjective: Patient reports doing well  Objective: Vital signs in last 24 hours: Temp:  [97.6 F (36.4 C)-98.4 F (36.9 C)] 98.4 F (36.9 C) (11/17 0400) Pulse Rate:  [54-65] 57 (11/17 0400) Resp:  [10-20] 20 (11/17 0400) BP: (111-144)/(66-92) 120/66 (11/17 0400) SpO2:  [93 %-100 %] 98 % (11/17 0400)  Intake/Output from previous day: 11/16 0701 - 11/17 0700 In: 2240 [P.O.:240; I.V.:1500; IV Piggyback:500] Out: 2125 [Urine:2050; Blood:75] Intake/Output this shift: No intake/output data recorded.  Physical Exam: Full strength both legs, stabbing pain is gone. No numbness.  Dressings CDI.  Lab Results: No results for input(s): WBC, HGB, HCT, PLT in the last 72 hours. BMET No results for input(s): NA, K, CL, CO2, GLUCOSE, BUN, CREATININE, CALCIUM in the last 72 hours.  Studies/Results: Dg Lumbar Spine 2-3 Views  Result Date: 04/18/2016 CLINICAL DATA:  L3-L5 XLIF. EXAM: DG C-ARM GT 120 MIN; LUMBAR SPINE - 2-3 VIEW FLUOROSCOPY TIME:  Fluoroscopy Time:  6 minutes 5 seconds Number of Acquired Spot Images: 4 COMPARISON:  04/19/2009 lumbar spine radiographs. FINDINGS: Four nondiagnostic spot fluoroscopic intraoperative lumbar spine radiographs demonstrate postsurgical changes from bilateral posterior spinal fusion at L3-L5. Bone cages are seen in the L3-4 and L4-5 disc spaces. IMPRESSION: Intraoperative fluoroscopic guidance for L3-L5 lumbar spine surgery. Electronically Signed   By: Ilona Sorrel M.D.   On: 04/18/2016 13:24   Dg C-arm Gt 120 Min  Result Date: 04/18/2016 CLINICAL DATA:  L3-L5 XLIF. EXAM: DG C-ARM GT 120 MIN; LUMBAR SPINE - 2-3 VIEW FLUOROSCOPY TIME:  Fluoroscopy Time:  6 minutes 5 seconds Number of Acquired Spot Images: 4 COMPARISON:  04/19/2009 lumbar spine radiographs. FINDINGS: Four nondiagnostic spot fluoroscopic intraoperative lumbar spine radiographs demonstrate postsurgical changes from bilateral posterior spinal fusion at L3-L5. Bone cages are seen in the L3-4 and  L4-5 disc spaces. IMPRESSION: Intraoperative fluoroscopic guidance for L3-L5 lumbar spine surgery. Electronically Signed   By: Ilona Sorrel M.D.   On: 04/18/2016 13:24    Assessment/Plan: Patient is doing well.  Mobilize with PT.  Wants to go to [facility] (bed is reserved for him).  May need to move to [facility] depending on when bed is available.    LOS: 1 day    Peggyann Shoals, MD 04/19/2016, 7:34 AM

## 2016-04-19 NOTE — Evaluation (Signed)
Occupational Therapy Evaluation Patient Details Name: [name] MRN: [id number] DOB: Jan 05, 1941 Today's Date: 04/19/2016    History of Present Illness Pt is a 75 y/o male who presents s/p L3-L5 anterior lateral fusion on 04/18/16.   Past Medical History:  Diagnosis Date  . Arthritis   . Atrial fibrillation ([place]) 1962   a. No recurrence since 1962.  . Chest pain    a. 1998 Cath: nl cors;  b. 2009 Cath: nl cors.  . Depression   . Dysrhythmia    NO TROUBLE IN 1 YR    DR. PETER [place]   . Esophageal reflux   . Essential hypertension   . History of Paroxysmal atrial flutter ([place])   . Hyperlipidemia   . Leg pain, bilateral   . Nocturia   . PONV (postoperative nausea and vomiting)   . Scarlet fever 1945  . Staph skin infection    LEFT GROIN  04/11/16  TX W/ DOXYCYCLINE  . Ulcerative proctitis ([place])       Clinical Impression   Patient is s/p L3-5  surgery resulting in functional limitations due to the deficits listed below (see OT problem list). Pt with decr in BP during session and required return to supine.  Patient will benefit from skilled OT acutely to increase independence and safety with ADLS to allow discharge SNF.      Follow Up Recommendations  SNF    Equipment Recommendations  Other (comment) (defer snf)    Recommendations for Other Services       Precautions / Restrictions Precautions Precautions: Fall;Back Precaution Comments: back handout provided and reviewed for adls in detail Required Braces or Orthoses: Spinal Brace Spinal Brace: Lumbar corset;Applied in sitting position      Mobility Bed Mobility Overal bed mobility: Needs Assistance   Rolling: Min assist     Sit to supine: Mod assist   General bed mobility comments: requires (A) with bil LE   Transfers Overall transfer level: Needs assistance Equipment used: Rolling walker (2 wheeled) Transfers: Sit to/from Stand Sit to Stand: Min guard         General transfer comment:  cues for hand placement    Balance                                            ADL Overall ADL's : Needs assistance/impaired Eating/Feeding: Modified independent Eating/Feeding Details (indicate cue type and reason): sitting eob unsupported on arrival   Grooming Details (indicate cue type and reason): educated on use of cup for oral care but unable to complete due to BP low Upper Body Bathing: Min guard   Lower Body Bathing: Moderate assistance           Toilet Transfer: Minimal assistance             General ADL Comments: pt reports dizziness as "i am spinning " on arrival. pt reports I am going to have to lay down. BP obtained 102/50 sitting and 98/67 static standing to step toward HOB. Pt with incr symptoms with change of position. Rn notified adn coming to bolus patient fluids  Back handout provided and reviewed adls in detail. Pt educated on: clothing between brace, never sleep in brace, set an alarm at night for medication, avoid sitting for long periods of time, correct bed positioning for sleeping, correct sequence for bed mobility, avoiding lifting more  than 5 pounds and never wash directly over incision. All education is complete and patient indicates understanding.       Vision     Perception     Praxis      Pertinent Vitals/Pain Pain Assessment: Faces Faces Pain Scale: Hurts little more Pain Location: R side incision and dizziness  Pain Descriptors / Indicators: Operative site guarding Pain Intervention(s): Monitored during session;Premedicated before session;Repositioned     Hand Dominance Right   Extremity/Trunk Assessment Upper Extremity Assessment Upper Extremity Assessment: Overall WFL for tasks assessed   Lower Extremity Assessment Lower Extremity Assessment: Defer to PT evaluation   Cervical / Trunk Assessment Cervical / Trunk Assessment: Other exceptions Cervical / Trunk Exceptions:  (s/p surg)   Communication  Communication Communication: No difficulties   Cognition Arousal/Alertness: Awake/alert Behavior During Therapy: WFL for tasks assessed/performed Overall Cognitive Status: Within Functional Limits for tasks assessed                     General Comments       Exercises       Shoulder Instructions      Home Living Family/patient expects to be discharged to:: Skilled nursing facility                                        Prior Functioning/Environment Level of Independence: Independent with assistive device(s)        Comments: Pt reports he would begin the day using the cane for assist and progress to needing crutches/walker by the end of the day due to pain.         OT Problem List: Decreased strength;Decreased activity tolerance;Impaired balance (sitting and/or standing);Decreased safety awareness;Decreased knowledge of use of DME or AE;Decreased knowledge of precautions;Cardiopulmonary status limiting activity;Pain   OT Treatment/Interventions: Self-care/ADL training;Therapeutic exercise;DME and/or AE instruction;Therapeutic activities;Patient/family education;Balance training    OT Goals(Current goals can be found in the care plan section) Acute Rehab OT Goals Patient Stated Goal: Home after rehab OT Goal Formulation: With patient Time For Goal Achievement: 05/03/16 Potential to Achieve Goals: Good  OT Frequency: Min 2X/week   Barriers to D/C:            Co-evaluation              End of Session Equipment Utilized During Treatment: Gait belt;Rolling walker;Back brace Nurse Communication: Mobility status;Precautions  Activity Tolerance: Patient tolerated treatment well Patient left: in bed;with call bell/phone within reach;with nursing/sitter in room   Time: 0820-0837 OT Time Calculation (min): 17 min Charges:  OT General Charges $OT Visit: 1 Procedure OT Evaluation $OT Eval Moderate Complexity: 1 Procedure G-Codes:    Peri Maris 05-04-2016, 3:24 PM   Jeri Modena   OTR/L Pager: [phone] Office: [phone] .

## 2016-04-19 NOTE — Evaluation (Signed)
Physical Therapy Evaluation Patient Details Name: [name] MRN: [id number] DOB: 01-Apr-1941 Today's Date: 04/19/2016   History of Present Illness  Pt is a 75 y/o male who presents s/p L3-L5 anterior lateral fusion on 04/18/16.   Clinical Impression  Pt admitted with above diagnosis. Pt currently with functional limitations due to the deficits listed below (see PT Problem List). Patient lives with wife who is recovering from a recent MI and does not have adequate support to safely discharge home. Recommend skilled nursing facility for post-acute therapy needs. Pt will benefit from skilled PT to increase their independence and safety with mobility to allow discharge to the venue listed below.       Follow Up Recommendations SNF;Supervision/Assistance - 24 hour    Equipment Recommendations  None recommended by PT    Recommendations for Other Services       Precautions / Restrictions Precautions Precautions: Fall;Back Precaution Booklet Issued: Yes (comment) Precaution Comments: Reviewed handout with pt and he was cued for precautions during functional mobility.  Required Braces or Orthoses: Spinal Brace Spinal Brace: Lumbar corset;Applied in sitting position Restrictions Weight Bearing Restrictions: No      Mobility  Bed Mobility Overal bed mobility: Needs Assistance Bed Mobility: Rolling;Sidelying to Sit Rolling: Supervision Sidelying to sit: Min guard       General bed mobility comments: Close guard for safety and maintenance of precautions during transition to EOB.   Transfers Overall transfer level: Needs assistance Equipment used: Rolling walker (2 wheeled) Transfers: Sit to/from Stand Sit to Stand: Min guard         General transfer comment: Close guard for safety as pt powered-up to full stand. Initially required increased time to gain/maintain balance.   Ambulation/Gait Ambulation/Gait assistance: Min guard Ambulation Distance (Feet): 175 Feet Assistive  device: Rolling walker (2 wheeled) Gait Pattern/deviations: Step-through pattern;Decreased stride length;Trunk flexed Gait velocity: Decreased Gait velocity interpretation: Below normal speed for age/gender General Gait Details: Pt required hands-on guarding during gait training as he complained of dizziness (pt felt likely from hypoglycemia). Several standing rest breaks required due to dizziness. RN notified.  Stairs            Wheelchair Mobility    Modified Rankin (Stroke Patients Only)       Balance Overall balance assessment: Needs assistance Sitting-balance support: Feet supported;No upper extremity supported Sitting balance-Leahy Scale: Good     Standing balance support: No upper extremity supported;During functional activity Standing balance-Leahy Scale: Fair Standing balance comment: Statically                             Pertinent Vitals/Pain Pain Assessment: Faces Faces Pain Scale: Hurts a little bit Pain Location: Incision site Pain Descriptors / Indicators: Operative site guarding;Discomfort Pain Intervention(s): Limited activity within patient's tolerance;Monitored during session;Repositioned    Home Living Family/patient expects to be discharged to:: Skilled nursing facility Living Arrangements: Spouse/significant other                    Prior Function Level of Independence: Independent with assistive device(s)         Comments: Pt reports he would begin the day using the cane for assist and progress to needing crutches/walker by the end of the day due to pain.      Hand Dominance        Extremity/Trunk Assessment   Upper Extremity Assessment: Defer to OT evaluation  Lower Extremity Assessment: Generalized weakness      Cervical / Trunk Assessment: Other exceptions  Communication   Communication: No difficulties  Cognition Arousal/Alertness: Awake/alert Behavior During Therapy: WFL for tasks  assessed/performed Overall Cognitive Status: Within Functional Limits for tasks assessed                      General Comments      Exercises     Assessment/Plan    PT Assessment Patient needs continued PT services  PT Problem List Decreased strength;Decreased range of motion;Decreased activity tolerance;Decreased balance;Decreased mobility;Decreased knowledge of use of DME;Decreased safety awareness;Decreased knowledge of precautions;Pain          PT Treatment Interventions DME instruction;Gait training;Stair training;Functional mobility training;Therapeutic activities;Therapeutic exercise;Neuromuscular re-education;Patient/family education    PT Goals (Current goals can be found in the Care Plan section)  Acute Rehab PT Goals Patient Stated Goal: Home after rehab PT Goal Formulation: With patient Time For Goal Achievement: 04/26/16 Potential to Achieve Goals: Good    Frequency Min 5X/week   Barriers to discharge Decreased caregiver support Per pt, wife recovering from recent MI    Co-evaluation               End of Session Equipment Utilized During Treatment: Back brace Activity Tolerance: Treatment limited secondary to medical complications (Comment) (Dizziness possibly from hypoglycemia) Patient left: with call bell/phone within reach (Sitting EOB eating crackers/peanut butter) Nurse Communication: Mobility status         Time: [id number] PT Time Calculation (min) (ACUTE ONLY): 16 min   Charges:   PT Evaluation $PT Eval Moderate Complexity: 1 Procedure     PT G CodesThelma Comp 05-08-2016, 8:48 AM   Rolinda Roan, PT, DPT Acute Rehabilitation Services Pager: [phone]

## 2016-04-19 NOTE — Progress Notes (Signed)
Report called to Claiborne Billings, RN on 87M. Pt transferred to 87M09 d/t Pt needing SNF placement and will be here through the weekend. Pt is transferred by wheelchair @ 1635 per MD order. Pt is stable @ transfer. Holli Humbles, RN

## 2016-04-19 NOTE — Discharge Summary (Signed)
Physician Discharge Summary  Patient ID: [name] MRN: [id number] DOB/AGE: [birth year] 75 y.o.  Admit date: 04/18/2016 Discharge date: 04/19/2016  Admission Diagnoses:Lumbar spondylolisthesis, facet fracture, stenosis, herniated lumbar disc, radiculopathy, lumbago  Discharge Diagnoses: Lumbar spondylolisthesis, facet fracture, stenosis, herniated lumbar disc, radiculopathy, lumbago Active Problems:   Spinal stenosis, lumbar region, with neurogenic claudication   Discharged Condition: good  Hospital Course: Patient underwent two level decompression and fusion surgery with L 34 and L 45 XLIF and percutaneous pedicle screw fixation L 3- L 5 levels.  Postoperatively, he did well, was mobilized with PT and had resolution of preoperative leg pain  Consults: None  Significant Diagnostic Studies: None  Treatments: surgery: two level decompression and fusion surgery with L 34 and L 45 XLIF and percutaneous pedicle screw fixation L 3- L 5 levels  Discharge Exam: Blood pressure (!) 97/52, pulse 62, temperature 99.2 F (37.3 C), temperature source Oral, resp. rate 18, weight 110.7 kg (244 lb), SpO2 94 %. Neurologic: Alert and oriented X 3, normal strength and tone. Normal symmetric reflexes. Normal coordination and gait Wound:CDI  Disposition: SNF for postoperative rehab.     Medication List    TAKE these medications   ACETAMINOPHEN PO Take 325 mg by mouth every 6 (six) hours as needed (pain).   ANTI-FUNGAL 1 % cream Generic drug:  clotrimazole Apply 1 application topically 2 (two) times daily as needed (rash).   aspirin 81 MG tablet Take 81 mg by mouth daily.   carvedilol 3.125 MG tablet Commonly known as:  COREG Take 3.125 mg by mouth 2 (two) times daily with a meal.   clonazePAM 0.5 MG tablet Commonly known as:  KLONOPIN TAKE 1/2 TO 1 TABLET AT BEDTIME AS NEEDED.   COENZYME Q-10 PO Take 1,000 mg by mouth daily.   CRESTOR 10 MG tablet Generic drug:   rosuvastatin TAKE 1 TABLET ONCE DAILY OR AS DIRECTED. What changed:  See the new instructions.   doxycycline 100 MG EC tablet Commonly known as:  DORYX Take 100 mg by mouth 2 (two) times daily.   ezetimibe 10 MG tablet Commonly known as:  ZETIA Take 1 tablet (10 mg total) by mouth daily.   Ferrous Fum-Iron Polysacch-FA 162-115.2-1 MG Caps Take 1 tablet by mouth daily.   lisinopril 2.5 MG tablet Commonly known as:  PRINIVIL,ZESTRIL Take 1 tablet (2.5 mg total) by mouth daily.   LYRICA 100 MG capsule Generic drug:  pregabalin Take 1 capsule (100 mg total) by mouth 2 (two) times daily.   methocarbamol 500 MG tablet Commonly known as:  ROBAXIN Take 1 tablet (500 mg total) by mouth every 6 (six) hours as needed for muscle spasms.   nitroGLYCERIN 0.4 MG SL tablet Commonly known as:  NITROSTAT Place 0.4 mg under the tongue every 5 (five) minutes as needed for chest pain.   omeprazole 20 MG tablet Commonly known as:  PRILOSEC OTC Take 20 mg by mouth daily.   oxyCODONE-acetaminophen 5-325 MG tablet Commonly known as:  PERCOCET/ROXICET Take 1-2 tablets by mouth every 4 (four) hours as needed for moderate pain.   sertraline 50 MG tablet Commonly known as:  ZOLOFT Take 50 mg by mouth daily.   VITAMIN C PO Take 500 mg by mouth daily.        Signed: Peggyann Shoals, MD 04/19/2016, 2:25 PM

## 2016-04-19 NOTE — NC FL2 (Signed)
[place] LEVEL OF CARE SCREENING TOOL     IDENTIFICATION  Patient Name: [name] Birthdate: 01-27-1941 Sex: male Admission Date (Current Location): 04/18/2016  [facility] and [place] Number:  Herbalist and Address:  [facility] [place] [address], [place], [place] [address]      Provider Number: [id number]  Attending Physician Name and Address:  Erline Levine, MD  Relative Name and Phone Number:       Current Level of Care: Hospital Recommended Level of Care: [place] Prior Approval Number:    Date Approved/Denied:   PASRR Number: [id number]  Discharge Plan: SNF    Current Diagnoses: Patient Active Problem List   Diagnosis Date Noted  . Spinal stenosis, lumbar region, with neurogenic claudication 04/18/2016  . Essential hypertension   . Restless leg syndrome, uncontrolled 08/31/2014  . Restless leg syndrome 04/19/2014  . Leg pain, bilateral   . Normal coronary arteries 02/01/2014  . CKD (chronic kidney disease), stage III 07/05/2013  . Dysphagia 07/05/2013  . Esophageal reflux 07/05/2013  . Hearing difficulty 07/05/2013  . Hypertension 07/05/2013  . Impaired fasting glucose 07/05/2013  . Insomnia 07/05/2013  . Spinal stenosis 07/05/2013  . Major depressive disorder, recurrent episode ([place]) 07/05/2013  . Osteoarthrosis, unspecified whether generalized or localized, unspecified site 07/05/2013  . Tachyarrhythmia 07/05/2013  . 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 Mixed hyperlipidemia 05/24/2013    Orientation RESPIRATION BLADDER Height & Weight     Self, Time, Situation, Place  Normal Continent Weight: 110.7 kg (244 lb) Height:     BEHAVIORAL SYMPTOMS/MOOD NEUROLOGICAL BOWEL NUTRITION STATUS   (NONE)  (NONE) Continent Diet (Regular)  AMBULATORY STATUS COMMUNICATION OF NEEDS Skin   Limited Assist Verbally Surgical wounds (Incisions - right flank, bilateral back)                        Personal Care Assistance Level of Assistance  Bathing, Feeding, Dressing Bathing Assistance: Limited assistance Feeding assistance: Independent Dressing Assistance: Limited assistance     Functional Limitations Info  Sight, Hearing, Speech Sight Info: Adequate Hearing Info: Adequate Speech Info: Adequate    SPECIAL CARE FACTORS FREQUENCY  PT (By licensed PT), OT (By licensed OT)     PT Frequency: 5/week OT Frequency: 5/week            Contractures Contractures Info: Not present    Additional Factors Info  Code Status, Allergies, Psychotropic Code Status Info: Full Code Allergies Info: Atorvastatin, Compazine Prochlorperazine Edisylate, Gemfibrozil, Monascus Purpureus Went Yeast, Niacin And Related, Rosuvastatin, Welchol Colesevelam Hcl, Penicillins Psychotropic Info: Zoloft         Current Medications (04/19/2016):  This is the current hospital active medication list Current Facility-Administered Medications  Medication Dose Route Frequency Provider Last Rate Last Dose  . 0.9 %  sodium chloride infusion  250 mL Intravenous Continuous Erline Levine, MD      . acetaminophen (TYLENOL) tablet 650 mg  650 mg Oral Q4H PRN Erline Levine, MD       Or  . acetaminophen (TYLENOL) suppository 650 mg  650 mg Rectal Q4H PRN Erline Levine, MD      . alum & mag hydroxide-simeth (MAALOX/MYLANTA) 200-200-20 MG/5ML suspension 30 mL  30 mL Oral Q6H PRN Erline Levine, MD      . aspirin EC tablet 81 mg  81 mg Oral Daily Erline Levine, MD   81 mg at 04/19/16 1000  . bisacodyl (  DULCOLAX) suppository 10 mg  10 mg Rectal Daily PRN Erline Levine, MD      . carvedilol (COREG) tablet 3.125 mg  3.125 mg Oral BID WC Erline Levine, MD   3.125 mg at 04/19/16 1000  . clotrimazole (LOTRIMIN) 1 % cream 1 application  1 application Topical BID PRN Erline Levine, MD      . dextrose 5 % and 0.45 % NaCl with KCl 20 mEq/L infusion   Intravenous Continuous Erline Levine, MD      . docusate sodium (COLACE)  capsule 100 mg  100 mg Oral BID Erline Levine, MD   100 mg at 04/19/16 1000  . doxycycline (VIBRA-TABS) tablet 100 mg  100 mg Oral BID Erline Levine, MD   100 mg at 04/19/16 1001  . ezetimibe (ZETIA) tablet 10 mg  10 mg Oral Daily Erline Levine, MD   10 mg at 04/19/16 1001  . ferrous sulfate tablet 325 mg  325 mg Oral Q breakfast Erline Levine, MD   325 mg at 04/19/16 1000  . HYDROcodone-acetaminophen (NORCO/VICODIN) 5-325 MG per tablet 1-2 tablet  1-2 tablet Oral Q4H PRN Erline Levine, MD      . HYDROmorphone (DILAUDID) injection 0.5-1 mg  0.5-1 mg Intravenous Q2H PRN Erline Levine, MD   1 mg at 04/18/16 1825  . lisinopril (PRINIVIL,ZESTRIL) tablet 2.5 mg  2.5 mg Oral Daily Erline Levine, MD   2.5 mg at 04/18/16 2035  . menthol-cetylpyridinium (CEPACOL) lozenge 3 mg  1 lozenge Oral PRN Erline Levine, MD       Or  . phenol (CHLORASEPTIC) mouth spray 1 spray  1 spray Mouth/Throat PRN Erline Levine, MD      . methocarbamol (ROBAXIN) tablet 500 mg  500 mg Oral Q6H PRN Erline Levine, MD   500 mg at 04/19/16 0522   Or  . methocarbamol (ROBAXIN) 500 mg in dextrose 5 % 50 mL IVPB  500 mg Intravenous Q6H PRN Erline Levine, MD      . nitroGLYCERIN (NITROSTAT) SL tablet 0.4 mg  0.4 mg Sublingual Q5 min PRN Erline Levine, MD      . ondansetron [facility]) injection 4 mg  4 mg Intravenous Q4H PRN Erline Levine, MD      . oxyCODONE-acetaminophen (PERCOCET/ROXICET) 5-325 MG per tablet 1-2 tablet  1-2 tablet Oral Q4H PRN Erline Levine, MD   2 tablet at 04/19/16 0520  . pantoprazole (PROTONIX) injection 40 mg  40 mg Intravenous QHS Erline Levine, MD   40 mg at 04/18/16 2041  . pregabalin (LYRICA) capsule 100 mg  100 mg Oral BID Erline Levine, MD   100 mg at 04/19/16 0959  . rosuvastatin (CRESTOR) tablet 10 mg  10 mg Oral q1800 Erline Levine, MD   10 mg at 04/18/16 2034  . senna-docusate (Senokot-S) tablet 1 tablet  1 tablet Oral QHS PRN Erline Levine, MD      . sertraline (ZOLOFT) tablet 50 mg  50 mg Oral Daily Erline Levine, MD   50  mg at 04/19/16 1000  . sodium chloride flush (NS) 0.9 % injection 3 mL  3 mL Intravenous Q12H Erline Levine, MD      . sodium chloride flush (NS) 0.9 % injection 3 mL  3 mL Intravenous PRN Erline Levine, MD      . sodium phosphate (FLEET) 7-19 GM/118ML enema 1 enema  1 enema Rectal Once PRN Erline Levine, MD      . zolpidem (AMBIEN) tablet 5 mg  5 mg Oral QHS PRN Broadus John  Vertell Limber, MD         Discharge Medications: Please see discharge summary for a list of discharge medications.  Relevant Imaging Results:  Relevant Lab Results:   Additional Information ssn: [id number]  Rigoberto Noel, LCSW

## 2016-04-20 ENCOUNTER — Encounter (HOSPITAL_COMMUNITY): Payer: Self-pay | Admitting: *Deleted

## 2016-04-20 NOTE — Progress Notes (Signed)
Patient ID: [name], male   DOB: 12/07/1940, 75 y.o.   MRN: [id number] Subjective:  The patient is alert and pleasant. He has some mild abdominal discomfort. He denies flatus. There's been no nausea or vomiting. His back is appropriately sore. He is having some leg cramping.  Objective: Vital signs in last 24 hours: Temp:  [98 F (36.7 C)-99.5 F (37.5 C)] 98 F (36.7 C) (11/18 0605) Pulse Rate:  [61-85] 66 (11/18 0605) Resp:  [18-20] 20 (11/18 0605) BP: (97-121)/(52-76) 109/61 (11/18 0605) SpO2:  [92 %-98 %] 96 % (11/18 0605)  Intake/Output from previous day: 11/17 0701 - 11/18 0700 In: 1080 [P.O.:1080] Out: 450 [Urine:450] Intake/Output this shift: No intake/output data recorded.  Physical exam patient is alert and pleasant. His abdomen is obese and soft. He is moving his lower extremities well. His wounds are healing well without drainage. There is some ecchymosis.  Lab Results: No results for input(s): WBC, HGB, HCT, PLT in the last 72 hours. BMET No results for input(s): NA, K, CL, CO2, GLUCOSE, BUN, CREATININE, CALCIUM in the last 72 hours.  Studies/Results: Dg Lumbar Spine 2-3 Views  Result Date: 04/18/2016 CLINICAL DATA:  L3-L5 XLIF. EXAM: DG C-ARM GT 120 MIN; LUMBAR SPINE - 2-3 VIEW FLUOROSCOPY TIME:  Fluoroscopy Time:  6 minutes 5 seconds Number of Acquired Spot Images: 4 COMPARISON:  04/19/2009 lumbar spine radiographs. FINDINGS: Four nondiagnostic spot fluoroscopic intraoperative lumbar spine radiographs demonstrate postsurgical changes from bilateral posterior spinal fusion at L3-L5. Bone cages are seen in the L3-4 and L4-5 disc spaces. IMPRESSION: Intraoperative fluoroscopic guidance for L3-L5 lumbar spine surgery. Electronically Signed   By: Ilona Sorrel M.D.   On: 04/18/2016 13:24   Dg C-arm Gt 120 Min  Result Date: 04/18/2016 CLINICAL DATA:  L3-L5 XLIF. EXAM: DG C-ARM GT 120 MIN; LUMBAR SPINE - 2-3 VIEW FLUOROSCOPY TIME:  Fluoroscopy Time:  6 minutes 5  seconds Number of Acquired Spot Images: 4 COMPARISON:  04/19/2009 lumbar spine radiographs. FINDINGS: Four nondiagnostic spot fluoroscopic intraoperative lumbar spine radiographs demonstrate postsurgical changes from bilateral posterior spinal fusion at L3-L5. Bone cages are seen in the L3-4 and L4-5 disc spaces. IMPRESSION: Intraoperative fluoroscopic guidance for L3-L5 lumbar spine surgery. Electronically Signed   By: Ilona Sorrel M.D.   On: 04/18/2016 13:24    Assessment/Plan: Postop day #2: I encouraged patient to mobilize. We are awaiting nursing home placement. If he has any nausea or vomiting we may consider a KUB to rule out ileus.  LOS: 2 days     [name] D 04/20/2016, 7:05 AM

## 2016-04-20 NOTE — Clinical Social Work Note (Signed)
Per CSW handoff, patient can discharge to [facility] today. MD notified and will have discharge summary and discharge order in, in 30 minutes-1 hour.   [name], [place]

## 2016-04-20 NOTE — Progress Notes (Signed)
Physical Therapy Treatment Patient Details Name: [name] MRN: [id number] DOB: Jan 27, 1941 Today's Date: 04/20/2016    History of Present Illness Pt is a 75 y/o male who presents s/p L3-L5 anterior lateral fusion on 04/18/16.     PT Comments    Patient limited today due to increase in pain and dizziness during gait.    Follow Up Recommendations  SNF;Supervision/Assistance - 24 hour     Equipment Recommendations  None recommended by PT    Recommendations for Other Services       Precautions / Restrictions Precautions Precautions: Fall;Back Precaution Comments: Reviewed precautions Required Braces or Orthoses: Spinal Brace Spinal Brace: Lumbar corset;Applied in sitting position Restrictions Weight Bearing Restrictions: No    Mobility  Bed Mobility Overal bed mobility: Needs Assistance Bed Mobility: Rolling;Sidelying to Sit Rolling: Min assist Sidelying to sit: Min assist       General bed mobility comments: Verbal cues for technique to maintain precautions.  Assist to complete rolling and to raise trunk to sitting position.  Min assist to don brace.  Transfers Overall transfer level: Needs assistance Equipment used: Rolling walker (2 wheeled) Transfers: Sit to/from Stand Sit to Stand: Min guard         General transfer comment: cues for hand placement  Ambulation/Gait Ambulation/Gait assistance: Min guard Ambulation Distance (Feet): 125 Feet Assistive device: Rolling walker (2 wheeled) Gait Pattern/deviations: Step-through pattern;Decreased stride length Gait velocity: Decreased Gait velocity interpretation: Below normal speed for age/gender General Gait Details: Verbal cues to stand upright.  Patient c/o dizziness during gait, requiring several standing rest breaks.  Limited distance due to dizziness and pain.  RN notified.   Stairs            Wheelchair Mobility    Modified Rankin (Stroke Patients Only)       Balance            Standing balance support: Bilateral upper extremity supported Standing balance-Leahy Scale: Poor                      Cognition Arousal/Alertness: Awake/alert Behavior During Therapy: WFL for tasks assessed/performed Overall Cognitive Status: Within Functional Limits for tasks assessed                      Exercises      General Comments        Pertinent Vitals/Pain Pain Assessment: 0-10 Pain Score: 8  Pain Location: back Pain Descriptors / Indicators: Sore;Tightness Pain Intervention(s): Limited activity within patient's tolerance;Monitored during session;Repositioned    Home Living Family/patient expects to be discharged to:: Skilled nursing facility Living Arrangements: Spouse/significant other                  Prior Function            PT Goals (current goals can now be found in the care plan section) Acute Rehab PT Goals Patient Stated Goal: Home after rehab Progress towards PT goals: Not progressing toward goals - comment (Dizziness)    Frequency    Min 5X/week      PT Plan Current plan remains appropriate    Co-evaluation             End of Session Equipment Utilized During Treatment: Back brace Activity Tolerance: Patient limited by pain (Limited by dizziness.) Patient left: in chair;with call bell/phone within reach;with chair alarm set;with family/visitor present     Time: 1336-1350 PT Time Calculation (min) (ACUTE ONLY): 14  min  Charges:  $Gait Training: 8-22 mins                    G Codes:      Despina Pole 05-20-16, 1:58 PM Carita Pian. Sanjuana Kava, [place] Pager [phone]

## 2016-04-21 NOTE — Progress Notes (Signed)
Gait steady. No complaints of dizziness.

## 2016-04-21 NOTE — Progress Notes (Signed)
Pt standby assist with ambulation with rolling walker brace on and aligned. Surgical honeycomb dressing dry and intact. Pt denies discomfort at this time. Pt to be discharged tomorrow to [facility].

## 2016-04-21 NOTE — Progress Notes (Signed)
No acute events Moving legs well Incisions look good Plan is to d/c to [facility]

## 2016-04-21 NOTE — Progress Notes (Signed)
Physical Therapy Treatment Patient Details Name: [name] MRN: [id number] DOB: 1941-04-30 Today's Date: 04/21/2016    History of Present Illness Pt is a 75 y/o male who presents s/p L3-L5 anterior lateral fusion on 04/18/16.     PT Comments    Patient progressing well with mobility and gait today.  No dizziness reported.  Follow Up Recommendations  SNF;Supervision/Assistance - 24 hour     Equipment Recommendations  None recommended by PT    Recommendations for Other Services       Precautions / Restrictions Precautions Precautions: Fall;Back Precaution Comments: Reviewed precautions Required Braces or Orthoses: Spinal Brace Spinal Brace: Lumbar corset;Applied in sitting position Restrictions Weight Bearing Restrictions: No    Mobility  Bed Mobility Overal bed mobility: Needs Assistance Bed Mobility: Sit to Sidelying         Sit to sidelying: Min guard General bed mobility comments: Patient able to use correct technique.  Required increased time.  Slight struggle to bring LE's onto bed.  Transfers Overall transfer level: Needs assistance Equipment used: Rolling walker (2 wheeled) Transfers: Sit to/from Stand Sit to Stand: Min guard         General transfer comment: cues for hand placement  Ambulation/Gait Ambulation/Gait assistance: Min guard Ambulation Distance (Feet): 200 Feet Assistive device: Rolling walker (2 wheeled) Gait Pattern/deviations: Step-through pattern;Decreased stride length (Leaning slightly to Lt) Gait velocity: Decreased Gait velocity interpretation: Below normal speed for age/gender General Gait Details: Cues to stand upright.  Focus on minimizing weight through UE's.  No dizziness today.  Continues to lean to Lt at times.  Patient with slight loss of balance during turn x1 and able to self-correct.   Stairs            Wheelchair Mobility    Modified Rankin (Stroke Patients Only)       Balance            Standing balance support: No upper extremity supported;During functional activity (washing hands) Standing balance-Leahy Scale: Fair                      Cognition Arousal/Alertness: Awake/alert Behavior During Therapy: WFL for tasks assessed/performed Overall Cognitive Status: Within Functional Limits for tasks assessed                      Exercises      General Comments        Pertinent Vitals/Pain Pain Assessment: 0-10 Pain Score: 6  (during gait) Pain Location: back Pain Descriptors / Indicators: Aching;Sore Pain Intervention(s): Monitored during session;Repositioned    Home Living                      Prior Function            PT Goals (current goals can now be found in the care plan section) Acute Rehab PT Goals Patient Stated Goal: Home after rehab Progress towards PT goals: Progressing toward goals    Frequency    Min 5X/week      PT Plan Current plan remains appropriate    Co-evaluation             End of Session Equipment Utilized During Treatment: Gait belt;Back brace Activity Tolerance: Patient tolerated treatment well Patient left: in bed;with call bell/phone within reach;with bed alarm set     Time: 1724-1736 PT Time Calculation (min) (ACUTE ONLY): 12 min  Charges:  $Gait Training: 8-22 mins  G Codes:      Despina Pole 04/21/2016, 8:13 PM Carita Pian. Sanjuana Kava, [place] Pager [phone]

## 2016-04-22 NOTE — Care Management Important Message (Signed)
Important Message  Patient Details  Name: [name] MRN: [id number] Date of Birth: 1941-05-28   Medicare Important Message Given:  Yes    Tryniti Laatsch [address] 04/22/2016, 11:32 AM

## 2016-04-22 NOTE — Progress Notes (Signed)
Physical Therapy Treatment Patient Details Name: [name] MRN: [id number] DOB: February 27, 1941 Today's Date: 04/22/2016    History of Present Illness Pt is a 75 y/o male who presents s/p L3-L5 anterior lateral fusion on 04/18/16.     PT Comments    Pt very receptive to abdominal isometric exercises and education on precautions. Pt progressing well beginning to amb without AD however fatigues quickly. Acute PT to con't to follow.  Follow Up Recommendations  SNF;Supervision/Assistance - 24 hour     Equipment Recommendations  None recommended by PT    Recommendations for Other Services       Precautions / Restrictions Precautions Precautions: Fall;Back Precaution Booklet Issued: Yes (comment) Precaution Comments: Reviewed precautions Required Braces or Orthoses: Spinal Brace Spinal Brace: Lumbar corset;Applied in sitting position Restrictions Weight Bearing Restrictions: No    Mobility  Bed Mobility Overal bed mobility: Needs Assistance Bed Mobility: Rolling;Sidelying to Sit Rolling: Supervision Sidelying to sit: Supervision       General bed mobility comments: pt with increased time and definite use of bed rail  Transfers Overall transfer level: Needs assistance Equipment used: Rolling walker (2 wheeled) Transfers: Sit to/from Stand Sit to Stand: Min guard         General transfer comment: cues for hand placement  Ambulation/Gait Ambulation/Gait assistance: Min guard;Supervision Ambulation Distance (Feet): 200 Feet Assistive device: None;Rolling walker (2 wheeled) Gait Pattern/deviations: Step-through pattern;Decreased stride length Gait velocity: decreased Gait velocity interpretation: Below normal speed for age/gender General Gait Details: amb with RW at first, v/c's to minimize UE depdencence, pt supervision. amb 50' without AD, pt gaurded with decreased pace and min guard to steady pt. v/c's to contract abdominal muscles   Stairs             Wheelchair Mobility    Modified Rankin (Stroke Patients Only)       Balance Overall balance assessment: Needs assistance Sitting-balance support: Feet supported Sitting balance-Leahy Scale: Good     Standing balance support: No upper extremity supported Standing balance-Leahy Scale: Fair                      Cognition Arousal/Alertness: Awake/alert Behavior During Therapy: WFL for tasks assessed/performed Overall Cognitive Status: Within Functional Limits for tasks assessed                      Exercises      General Comments General comments (skin integrity, edema, etc.): pt able to don brace indep      Pertinent Vitals/Pain Pain Assessment: 0-10 Pain Score: 2  Pain Location: back Pain Descriptors / Indicators: Sore Pain Intervention(s): Monitored during session    Home Living                      Prior Function            PT Goals (current goals can now be found in the care plan section) Acute Rehab PT Goals Patient Stated Goal: [facility] Progress towards PT goals: Progressing toward goals    Frequency    Min 5X/week      PT Plan Current plan remains appropriate    Co-evaluation             End of Session Equipment Utilized During Treatment: Gait belt;Back brace Activity Tolerance: Patient tolerated treatment well Patient left: in chair;with call bell/phone within reach     Time: 1140-1158 PT Time Calculation (min) (ACUTE ONLY): 18 min  Charges:  $Gait Training: 8-22 mins                    G Codes:      Berline Lopes 05-11-16, 1:37 PM   Kittie Plater, PT, DPT Pager #: [phone] Office #: [phone]

## 2016-04-22 NOTE — Discharge Summary (Signed)
Physician Discharge Summary  Patient ID: [name] MRN: [id number] DOB/AGE: 02/06/1941 75 y.o.  Admit date: 04/18/2016 Discharge date: 04/22/2016  Admission Diagnoses:LUMBAR FORAMINAL STENOSIS, facet fracture with instability, spondylolisthesis, herniated lumbar disc, radiculopathy, lumbago L 34 and L 45 levels   Discharge Diagnoses: LUMBAR FORAMINAL STENOSIS, facet fracture with instability, spondylolisthesis, herniated lumbar disc, radiculopathy, lumbago L 34 and L 45 levels s/p RIGHT LUMBAR THREE-FOUR, LUMBAR FOUR-FIVE ANTEROLATERAL LUMBAR INTERBODY FUSION (Right) - RIGHT L3-4 L4-5 ANTEROLATERAL LUMBAR INTERBODY FUSION LUMBAR THREE-FOUR, LUMBAR FOUR-FIVE PERCUTANEOUS PEDICLE SCREW (N/A)   Active Problems:   Spinal stenosis, lumbar region, with neurogenic claudication   Discharged Condition: good  Hospital Course: [name] was admitted for surgery with dx lumbar foraminal stenosis, facet fracture, spondylolisthesis, and radiculopathy. Following uncomplicated XLIF [id number], [id number], he recovered well. He has mobilized nicely with PT.   Consults: None  Significant Diagnostic Studies: radiology: X-Ray: intra-op  Treatments: surgery: RIGHT LUMBAR THREE-FOUR, LUMBAR FOUR-FIVE ANTEROLATERAL LUMBAR INTERBODY FUSION (Right) - RIGHT L3-4 L4-5 ANTEROLATERAL LUMBAR INTERBODY FUSION LUMBAR THREE-FOUR, LUMBAR FOUR-FIVE PERCUTANEOUS PEDICLE SCREW (N/A)   Discharge Exam: Blood pressure 109/63, pulse (!) 58, temperature 97.9 F (36.6 C), temperature source Oral, resp. rate 20, height 5\' 9"  (1.753 m), weight 110.7 kg (244 lb), SpO2 96 %. Alert, conversant. Reports increased lumbar and right thigh pain overnight. Good strength BLE. Incisions without erythema, swelling, or drainage. Bruising right side, not unexpected - pt aware. Few small BM's over weekend.    Disposition: Discharge to SNF [facility]).  Rx's on chart & FL2 signed. Pt verbalizes understanding of d/c instructions and agrees to  call office to schedule 3-4 week f/u appt.          Medication List    TAKE these medications   ACETAMINOPHEN PO Take 325 mg by mouth every 6 (six) hours as needed (pain).   ANTI-FUNGAL 1 % cream Generic drug:  clotrimazole Apply 1 application topically 2 (two) times daily as needed (rash).   aspirin 81 MG tablet Take 81 mg by mouth daily.   carvedilol 3.125 MG tablet Commonly known as:  COREG Take 3.125 mg by mouth 2 (two) times daily with a meal.   clonazePAM 0.5 MG tablet Commonly known as:  KLONOPIN TAKE 1/2 TO 1 TABLET AT BEDTIME AS NEEDED.   COENZYME Q-10 PO Take 1,000 mg by mouth daily.   CRESTOR 10 MG tablet Generic drug:  rosuvastatin TAKE 1 TABLET ONCE DAILY OR AS DIRECTED. What changed:  See the new instructions.   doxycycline 100 MG EC tablet Commonly known as:  DORYX Take 100 mg by mouth 2 (two) times daily.   ezetimibe 10 MG tablet Commonly known as:  ZETIA Take 1 tablet (10 mg total) by mouth daily.   Ferrous Fum-Iron Polysacch-FA 162-115.2-1 MG Caps Take 1 tablet by mouth daily.   lisinopril 2.5 MG tablet Commonly known as:  PRINIVIL,ZESTRIL Take 1 tablet (2.5 mg total) by mouth daily.   LYRICA 100 MG capsule Generic drug:  pregabalin Take 1 capsule (100 mg total) by mouth 2 (two) times daily.   methocarbamol 500 MG tablet Commonly known as:  ROBAXIN Take 1 tablet (500 mg total) by mouth every 6 (six) hours as needed for muscle spasms.   nitroGLYCERIN 0.4 MG SL tablet Commonly known as:  NITROSTAT Place 0.4 mg under the tongue every 5 (five) minutes as needed for chest pain.   omeprazole 20 MG tablet Commonly known as:  PRILOSEC OTC Take 20 mg by mouth daily.  oxyCODONE-acetaminophen 5-325 MG tablet Commonly known as:  PERCOCET/ROXICET Take 1-2 tablets by mouth every 4 (four) hours as needed for moderate pain.   sertraline 50 MG tablet Commonly known as:  ZOLOFT Take 50 mg by mouth daily.   VITAMIN C PO Take 500 mg by mouth  daily.      Contact information for after-discharge care    Destination    [facility] .   Specialty:  [place] information: [facility] [phone]              Signed: Verdis Prime 04/22/2016, 8:05 AM   Patient progressing well.  Transfer to SNF today.

## 2016-04-22 NOTE — Care Management Note (Signed)
Case Management Note  Patient Details  Name: [name] MRN: [id number] Date of Birth: Feb 07, 1941  Subjective/Objective:                    Action/Plan: Pt discharging to [facility] today. No further needs per CM.  Expected Discharge Date:                  Expected Discharge Plan:  Skilled Nursing Facility  In-House Referral:  Clinical Social Work  Discharge planning Services     Post Acute Care Choice:    Choice offered to:     DME Arranged:    DME Agency:     HH Arranged:    [place] Agency:     Status of Service:  Completed, signed off  If discussed at [employer], dates discussed:    Additional Comments:  Pollie Friar, RN 04/22/2016, 12:45 PM

## 2016-04-22 NOTE — Clinical Social Work Placement (Signed)
   CLINICAL SOCIAL WORK PLACEMENT  NOTE  Date:  04/22/2016  Patient Details  Name: [name] MRN: [id number] Date of Birth: 1940-10-26  Clinical Social Work is seeking post-discharge placement for this patient at the [place] level of care (*CSW will initial, date and re-position this form in  chart as items are completed):      Patient/family provided with [facility] Work Department's list of facilities offering this level of care within the geographic area requested by the patient (or if unable, by the patient's family).      Patient/family informed of their freedom to choose among providers that offer the needed level of care, that participate in Medicare, Medicaid or managed care program needed by the patient, have an available bed and are willing to accept the patient.      Patient/family informed of [facility]'s ownership interest in [facility] and [facility], as well as of the fact that they are under no obligation to receive care at these facilities.  PASRR submitted to EDS on       PASRR number received on       Existing PASRR number confirmed on       FL2 transmitted to all facilities in geographic area requested by pt/family on 04/19/16     FL2 transmitted to all facilities within larger geographic area on       Patient informed that his/her managed care company has contracts with or will negotiate with certain facilities, including the following:        Yes   Patient/family informed of bed offers received.  Patient chooses bed at [facility]     Physician recommends and patient chooses bed at      Patient to be transferred to [facility] on 04/22/16.  Patient to be transferred to facility by Pt's son     Patient family notified on 04/22/16 of transfer.  Name of family member notified:  Pt's son, Gaylord     PHYSICIAN       Additional Comment:    _______________________________________________ Darden Dates,  LCSW 04/22/2016, 12:34 PM

## 2016-04-22 NOTE — Clinical Social Work Note (Signed)
Pt is ready for discharge today and will go to [facility]. Facility is ready to admit pt as they have received discharge information. Pt and son are agreeable to discharge plan. RN will call report. Pt's son will provide transportation. CSW is signing off as no further needs identified.   Darden Dates, MSW, LCSW  Clinical Social Worker [phone]

## 2016-04-22 NOTE — Progress Notes (Addendum)
Subjective: Patient reports "I had a rough night last night...I let the pain get ahead of me"  Objective: Vital signs in last 24 hours: Temp:  [97.9 F (36.6 C)-98.9 F (37.2 C)] 97.9 F (36.6 C) (11/20 0530) Pulse Rate:  [58-73] 58 (11/20 0530) Resp:  [18-20] 20 (11/20 0530) BP: (107-122)/(53-84) 109/63 (11/20 0530) SpO2:  [95 %-96 %] 96 % (11/20 0530)  Intake/Output from previous day: 11/19 0701 - 11/20 0700 In: -  Out: 150 [Urine:150] Intake/Output this shift: No intake/output data recorded.  Alert, conversant. Reports increased lumbar and right thigh pain overnight. Good strength BLE. Incisions without erythema, swelling, or drainage. Bruising right side, not unexpected - pt aware. Few small BM's over weekend.   Lab Results: No results for input(s): WBC, HGB, HCT, PLT in the last 72 hours. BMET No results for input(s): NA, K, CL, CO2, GLUCOSE, BUN, CREATININE, CALCIUM in the last 72 hours.  Studies/Results: No results found.  Assessment/Plan: Improving  LOS: 4 days  Per Dr. Vertell Limber, d/c IV, d/c to SNF [facility]) today. Rx's on chart & FL2 signed. Pt verbalizes understanding of d/c instructions and agrees to call office to schedule 3-4 week f/u appt.     Verdis Prime 04/22/2016, 8:01 AM    Patient is progressing well.  Transfer to SNF.

## 2016-04-22 NOTE — Progress Notes (Signed)
Prescriptions were given to patient. Report given to [facility]

## 2016-04-23 ENCOUNTER — Encounter: Payer: Self-pay | Admitting: Internal Medicine

## 2016-04-23 ENCOUNTER — Non-Acute Institutional Stay (SKILLED_NURSING_FACILITY): Payer: Medicare Other | Admitting: Internal Medicine

## 2016-04-23 DIAGNOSIS — R2681 Unsteadiness on feet: Secondary | ICD-10-CM | POA: Diagnosis not present

## 2016-04-23 DIAGNOSIS — F330 Major depressive disorder, recurrent, mild: Secondary | ICD-10-CM | POA: Diagnosis not present

## 2016-04-23 DIAGNOSIS — D509 Iron deficiency anemia, unspecified: Secondary | ICD-10-CM | POA: Diagnosis not present

## 2016-04-23 DIAGNOSIS — K5901 Slow transit constipation: Secondary | ICD-10-CM

## 2016-04-23 DIAGNOSIS — M48062 Spinal stenosis, lumbar region with neurogenic claudication: Secondary | ICD-10-CM | POA: Diagnosis not present

## 2016-04-23 DIAGNOSIS — N183 Chronic kidney disease, stage 3 unspecified: Secondary | ICD-10-CM

## 2016-04-23 DIAGNOSIS — I10 Essential (primary) hypertension: Secondary | ICD-10-CM

## 2016-04-23 DIAGNOSIS — G2581 Restless legs syndrome: Secondary | ICD-10-CM

## 2016-04-23 DIAGNOSIS — K219 Gastro-esophageal reflux disease without esophagitis: Secondary | ICD-10-CM

## 2016-04-23 NOTE — Progress Notes (Signed)
LOCATION: [facility]  PCP: Cari Caraway, MD   Code Status: Full Code  Goals of care: Advanced Directive information Advanced Directives 04/20/2016  Does Patient Have a Medical Advance Directive? Yes  Type of Paramedic of [place];Living will  Does patient want to make changes to medical advance directive? No - Patient declined  Copy of [place] in Chart? No - copy requested       Extended Emergency Contact Information Primary Emergency Contact: Cosey,Judith Address: [address]          [place], [place] [address] Johnnette Litter of [place] Phone: [phone] Mobile Phone: [phone] Relation: Spouse Secondary Emergency Contact: Valarie Cones [place] of [place] Mobile Phone: [phone] Relation: Daughter   Allergies  Allergen Reactions  . Atorvastatin Other (See Comments)    unknown  . Compazine [Prochlorperazine Edisylate] Other (See Comments)    Involuntary muscle movement   . Gemfibrozil Other (See Comments)    unknown  . Monascus Purpureus Went Yeast   . Niacin And Related     Flushing  . Rosuvastatin     Failed Crestor tiw due to leg aches (tolerated Crestor qweek and biw)  . Welchol [Colesevelam Hcl]     Leg aches  . Penicillins Nausea And Vomiting and Rash    Has patient had a PCN reaction causing immediate rash, facial/tongue/throat swelling, SOB or lightheadedness with hypotension: Yes Has patient had a PCN reaction causing severe rash involving mucus membranes or skin necrosis: No Has patient had a PCN reaction that required hospitalization No Has patient had a PCN reaction occurring within the last 10 years: No If all of the above answers are "NO", then may proceed with Cephalosporin use.     Chief Complaint  Patient presents with  . New Admit To SNF    New Admission Visit     HPI:  Patient is a 75 y.o. male seen today for short term rehabilitation post hospital admission  from 04/18/16-04/22/16 with lumbar stenosis. He underwent right lumbar interbody fusion L3-4 and L4-5 with screw fixation. He is seen in his room today.   Review of Systems:  Constitutional: Negative for fever, chills, diaphoresis.  HENT: Negative for headache, congestion, nasal discharge, sore throat, difficulty swallowing.   Eyes: Negative for double vision and discharge. Wears glasses.  Respiratory: Negative for cough, shortness of breath and wheezing.   Cardiovascular: Negative for chest pain, palpitations, leg swelling.  Gastrointestinal: Negative for nausea, vomiting, loss of appetite. Positive for heartburn and his PPI helps his symptom. Last bowel movement was yesterday. He would like to be on stool softner.  Genitourinary: Negative for dysuria and flank pain.  Musculoskeletal: Negative for fall in the facility. pain medication has been helpful with his back.  Skin: Negative for itching, rash.  Neurological: Positive for dizziness with change of position. Psychiatric/Behavioral: Negative for depression   Past Medical History:  Diagnosis Date  . Arthritis   . Atrial fibrillation ([place]) 1962   a. No recurrence since 1962.  . Chest pain    a. 1998 Cath: nl cors;  b. 2009 Cath: nl cors.  . Depression   . Dysrhythmia    NO TROUBLE IN 1 YR    DR. PETER [place]   . Esophageal reflux   . Essential hypertension   . History of Paroxysmal atrial flutter ([place])   . Hyperlipidemia   . Leg pain, bilateral   . Nocturia   . PONV (postoperative nausea and vomiting)   .  Scarlet fever 1945  . Staph skin infection    LEFT GROIN  04/11/16  TX W/ DOXYCYCLINE  . Ulcerative proctitis [facility])    Past Surgical History:  Procedure Laterality Date  . ANKLE RECONSTRUCTION Right 2007   [place]  . CARDIAC CATHETERIZATION  03/03/2008   Left heart cardiac catheterization and coronary  . CARDIAC CATHETERIZATION  1997   Dr Wynonia Lawman  . ESOPHAGEAL DILATION     2008  . INNER EAR SURGERY     EAR INJECTION  FOR MENIERES  . KNEE ARTHROSCOPY  1991, 1995  . LUMBAR LAMINECTOMY  12/2010   Social History:   reports that he has been smoking [place].  He has never used smokeless tobacco. He reports that he drinks alcohol. He reports that he does not use drugs.  Family History  Problem Relation Age of Onset  . Prostate cancer Father   . Heart attack Brother   . Kidney cancer Brother   . Brain cancer Brother     Medications:   Medication List       Accurate as of 04/23/16 11:54 AM. Always use your most recent med list.          ACETAMINOPHEN PO Take 325 mg by mouth every 6 (six) hours as needed (pain).   ANTI-FUNGAL 1 % cream Generic drug:  clotrimazole Apply 1 application topically 2 (two) times daily as needed (rash).   aspirin 81 MG tablet Take 81 mg by mouth daily.   carvedilol 3.125 MG tablet Commonly known as:  COREG Take 3.125 mg by mouth 2 (two) times daily with a meal.   clonazePAM 0.5 MG tablet Commonly known as:  KLONOPIN TAKE 1/2 TO 1 TABLET AT BEDTIME AS NEEDED.   COENZYME Q-10 PO Take 1,000 mg by mouth daily.   doxycycline 100 MG EC tablet Commonly known as:  DORYX Take 100 mg by mouth 2 (two) times daily.   ezetimibe 10 MG tablet Commonly known as:  ZETIA Take 1 tablet (10 mg total) by mouth daily.   Ferrous Fum-Iron Polysacch-FA 162-115.2-1 MG Caps Take 1 tablet by mouth daily.   lisinopril 2.5 MG tablet Commonly known as:  PRINIVIL,ZESTRIL Take 1 tablet (2.5 mg total) by mouth daily.   LYRICA 100 MG capsule Generic drug:  pregabalin Take 1 capsule (100 mg total) by mouth 2 (two) times daily.   methocarbamol 500 MG tablet Commonly known as:  ROBAXIN Take 1 tablet (500 mg total) by mouth every 6 (six) hours as needed for muscle spasms.   nitroGLYCERIN 0.4 MG SL tablet Commonly known as:  NITROSTAT Place 0.4 mg under the tongue every 5 (five) minutes as needed for chest pain.   omeprazole 20 MG tablet Commonly known as:  PRILOSEC OTC Take 20 mg  by mouth daily.   oxyCODONE-acetaminophen 5-325 MG tablet Commonly known as:  PERCOCET/ROXICET Take 1-2 tablets by mouth every 4 (four) hours as needed for moderate pain.   sertraline 50 MG tablet Commonly known as:  ZOLOFT Take 50 mg by mouth daily.   VITAMIN C PO Take 500 mg by mouth daily.       Immunizations: Immunization History  Administered Date(s) Administered  . Influenza,inj,Quad PF,36+ Mos 02/01/2014  . PPD Test 04/22/2016  . Pneumococcal Conjugate-13 09/20/2013  . Pneumococcal Polysaccharide-23 09/13/2014  . Td 10/13/2006  . Zoster 06/03/2010     Physical Exam: Vitals:   04/23/16 1143  BP: 112/68  Pulse: 78  Resp: 20  Temp: 97 F (36.1 C)  TempSrc: Oral  SpO2: 95%  Weight: 244 lb (110.7 kg)  Height: 5\' 9"  (1.753 m)   Body mass index is 36.03 kg/m.  General- elderly male, obese, in no acute distress Head- normocephalic, atraumatic Nose- no maxillary or frontal sinus tenderness, no nasal discharge Throat- moist mucus membrane  Eyes- PERRLA, EOMI, no pallor, no icterus, no discharge, normal conjunctiva, normal sclera Neck- no cervical lymphadenopathy Cardiovascular- normal s1,s2, no murmur, trace leg edema Respiratory- bilateral clear to auscultation, no wheeze, no rhonchi, no crackles, no use of accessory muscles Abdomen- bowel sounds present, soft, non tender Musculoskeletal- able to move all 4 extremities, generalized weakness Neurological- alert and oriented to person, place and time Skin- warm and dry, 3 surgical incision on the lumbar region with internal sutures and glue, 2 surgical incision to right lateral abdominal wall Psychiatry- normal mood and affect    Labs reviewed: Basic Metabolic Panel:  Recent Labs  04/15/16 1012  NA 137  K 4.9  CL 104  CO2 26  GLUCOSE 88  BUN 20  CREATININE 1.27*  CALCIUM 9.2   Liver Function Tests:  Recent Labs  05/10/15 0805 01/18/16 0819  AST 26 34  ALT 23 22  ALKPHOS 71 65  BILITOT  0.6 0.4  PROT 6.6 6.5  ALBUMIN 4.3 4.4   No results for input(s): LIPASE, AMYLASE in the last 8760 hours. No results for input(s): AMMONIA in the last 8760 hours. CBC:  Recent Labs  04/15/16 1012  WBC 7.4  HGB 14.9  HCT 44.0  MCV 93.4  PLT 201   CBG:  Recent Labs  04/19/16 0832  GLUCAP 129*    Radiological Exams: Dg Lumbar Spine 2-3 Views  Result Date: 04/18/2016 CLINICAL DATA:  L3-L5 XLIF. EXAM: DG C-ARM GT 120 MIN; LUMBAR SPINE - 2-3 VIEW FLUOROSCOPY TIME:  Fluoroscopy Time:  6 minutes 5 seconds Number of Acquired Spot Images: 4 COMPARISON:  04/19/2009 lumbar spine radiographs. FINDINGS: Four nondiagnostic spot fluoroscopic intraoperative lumbar spine radiographs demonstrate postsurgical changes from bilateral posterior spinal fusion at L3-L5. Bone cages are seen in the L3-4 and L4-5 disc spaces. IMPRESSION: Intraoperative fluoroscopic guidance for L3-L5 lumbar spine surgery. Electronically Signed   By: Ilona Sorrel M.D.   On: 04/18/2016 13:24   Mr Lumbar Spine Wo Contrast  Result Date: 03/26/2016 CLINICAL DATA:  Low back pain for 2 months. Left leg pain and numbness. Prior back surgeries at L3-4 L4-5. EXAM: MRI LUMBAR SPINE WITHOUT CONTRAST TECHNIQUE: Multiplanar, multisequence MR imaging of the lumbar spine was performed. No intravenous contrast was administered. COMPARISON:  11/04/2013 FINDINGS: Segmentation: The lowest lumbar type non-rib-bearing vertebra is labeled as L5. Alignment: 3 mm degenerative retrolisthesis at L3-4 and 3 mm degenerative anterolisthesis at L4-5. Vertebrae: Laminectomies and posterior decompression at L3, L4, and L5. Mild type 2 degenerative endplate findings at [id number] and L4-5. Conus medullaris: Extends to the L1 level and appears normal. Paraspinal and other soft tissues: Mild disc desiccation at L2- 3, L3-4, and L4-5. Disc levels: L1-2:  Unremarkable. L2-3: Mild right subarticular lateral recess stenosis due to disc bulge and facet arthropathy. Small  right facet joint effusion. L3-4: Mild to moderate bilateral foraminal stenosis due to facet and intervertebral spurring along with disc bulge. Left facet joint effusion. A 1.6 by 1.1 by 0.5 cm (volume = 0.5 cm^3) left posterior extradural fluid collection could be a postoperative seroma or a small synovial cyst. Impingement at this level is mildly worsened compared prior. L4-5: Mild bilateral foraminal stenosis and mild right  subarticular lateral recess stenosis due to disc bulge, intervertebral spurring, facet arthropathy, and a right lateral recess and inferior foraminal disc protrusion. Impingement mildly worsened compared to prior. L5-S1 coal no impingement. Bilateral facet arthropathy. Mild disc bulge. IMPRESSION: 1. 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 2. Prior posterior decompression at L3-L4-L5. Electronically Signed   By: Van Clines M.D.   On: 03/26/2016 08:22   Dg C-arm Gt 22 Min  Result Date: 04/18/2016 CLINICAL DATA:  L3-L5 XLIF. EXAM: DG C-ARM GT 120 MIN; LUMBAR SPINE - 2-3 VIEW FLUOROSCOPY TIME:  Fluoroscopy Time:  6 minutes 5 seconds Number of Acquired Spot Images: 4 COMPARISON:  04/19/2009 lumbar spine radiographs. FINDINGS: Four nondiagnostic spot fluoroscopic intraoperative lumbar spine radiographs demonstrate postsurgical changes from bilateral posterior spinal fusion at L3-L5. Bone cages are seen in the L3-4 and L4-5 disc spaces. IMPRESSION: Intraoperative fluoroscopic guidance for L3-L5 lumbar spine surgery. Electronically Signed   By: Ilona Sorrel M.D.   On: 04/18/2016 13:24     Assessment/Plan  Unsteady gait Will have patient work with PT/OT as tolerated to regain strength and restore function.  Fall precautions are in place.  Lumbar stenosis  S/p lumbar interbody fusion. Continue tylenol 325 mg q6h prn pain, percocet 5-325 mg 1-2 tab q4h prn  pain. Continue robaxin 500 mg q6h prn muscle spasm. To use back brace when out of bed. Will have him work with physical therapy and occupational therapy team to help with gait training and muscle strengthening exercises.fall precautions. Skin care. Encourage to be out of bed. Has follow up with neurosurgery.   Constipation Slow transit constipation. Add senokot s 2 tab qhs and miralax daily as needed and monitor  Iron def anemia Continue iron supplement and vitamin c, monitor  HTN Continue carvedilol 3125 mg bid and lisinopril 2.5 mg daily  Chronic depression Continue sertraline 50 mg daily and monitor.   gerd Stable, continue omeprazole 20 mg daily.   RLS Continue lyrica 100 mg bid and monitor  ckd stage 3 Monitor bmp  Discontinue doxycycline. Clarified with patient- was supposed to be on a course of doxycycline for skin infection prior to surgery.    Goals of care: short term rehabilitation   Labs/tests ordered: cbc, bmp 04/29/16  Family/ staff Communication: reviewed care plan with patient and nursing supervisor    Blanchie Serve, MD Internal Medicine [place], [place] [address] Cell Phone (Monday-Friday 8 am - 5 pm): [phone] On Call: [phone] and follow prompts after 5 pm and on weekends Office Phone: [phone] Office Fax: [phone]

## 2016-04-29 ENCOUNTER — Encounter: Payer: Self-pay | Admitting: Adult Health

## 2016-04-29 ENCOUNTER — Non-Acute Institutional Stay (SKILLED_NURSING_FACILITY): Payer: Medicare Other | Admitting: Adult Health

## 2016-04-29 DIAGNOSIS — E785 Hyperlipidemia, unspecified: Secondary | ICD-10-CM | POA: Diagnosis not present

## 2016-04-29 DIAGNOSIS — F419 Anxiety disorder, unspecified: Secondary | ICD-10-CM | POA: Diagnosis not present

## 2016-04-29 DIAGNOSIS — F330 Major depressive disorder, recurrent, mild: Secondary | ICD-10-CM

## 2016-04-29 DIAGNOSIS — R2681 Unsteadiness on feet: Secondary | ICD-10-CM

## 2016-04-29 DIAGNOSIS — N183 Chronic kidney disease, stage 3 unspecified: Secondary | ICD-10-CM

## 2016-04-29 DIAGNOSIS — G2581 Restless legs syndrome: Secondary | ICD-10-CM

## 2016-04-29 DIAGNOSIS — K5901 Slow transit constipation: Secondary | ICD-10-CM

## 2016-04-29 DIAGNOSIS — I10 Essential (primary) hypertension: Secondary | ICD-10-CM | POA: Diagnosis not present

## 2016-04-29 DIAGNOSIS — D509 Iron deficiency anemia, unspecified: Secondary | ICD-10-CM | POA: Diagnosis not present

## 2016-04-29 DIAGNOSIS — K219 Gastro-esophageal reflux disease without esophagitis: Secondary | ICD-10-CM | POA: Diagnosis not present

## 2016-04-29 DIAGNOSIS — M48062 Spinal stenosis, lumbar region with neurogenic claudication: Secondary | ICD-10-CM

## 2016-04-29 LAB — CBC AND DIFFERENTIAL
HEMATOCRIT: 35 % — AB (ref 41–53)
Hemoglobin: 11.5 g/dL — AB (ref 13.5–17.5)
NEUTROS ABS: 4 /uL
PLATELETS: 4 10*3/uL — AB (ref 150–399)
WBC: 7.4 10*3/mL

## 2016-04-29 LAB — BASIC METABOLIC PANEL
BUN: 19 mg/dL (ref 4–21)
Creatinine: 1.1 mg/dL (ref 0.6–1.3)
GLUCOSE: 94 mg/dL
Potassium: 4.8 mmol/L (ref 3.4–5.3)
Sodium: 138 mmol/L (ref 137–147)

## 2016-04-29 NOTE — Progress Notes (Signed)
DATE:  04/29/2016   MRN:  [id number]  BIRTHDAY: 01/30/1941  Facility:  [facility]ursing Home Location:  [place] Room Number: 408-P  LEVEL OF CARE:  SNF [phone])  Contact Information    Name Relation Home Work Mobile   [place] Spouse [phone]  [phone]   [facility] Daughter   [phone]   Dwanye, [place] [phone]         Code Status History    Date Active Date Inactive Code Status Order ID Comments User Context   04/18/2016  4:34 PM 04/22/2016  4:43 PM Full Code [id number]  Erline Levine, MD Inpatient       Chief Complaint  Patient presents with  . Discharge Note    HISTORY OF PRESENT ILLNESS:  This is a 75 year old male who is for discharge home with Home health PT, OT, CNA and Nursing.  He has been admitted to [facility] on 04/22/16 from [facility] admission dates 04/18/16 thru 04/19/16 with lumbar stenosis S/P right lumbar interbody fusion L3-4 and L4-5 with screw fixation.  Patient was admitted to this facility for short-term rehabilitation after the patient's recent hospitalization.  Patient has completed SNF rehabilitation and therapy has cleared the patient for discharge.   PAST MEDICAL HISTORY:  Past Medical History:  Diagnosis Date  . Arthritis   . Atrial fibrillation ([place]) 1962   a. No recurrence since 1962.  . Chest pain    a. 1998 Cath: nl cors;  b. 2009 Cath: nl cors.  . Depression   . Dysrhythmia    NO TROUBLE IN 1 YR    DR. PETER [place]   . Esophageal reflux   . Essential hypertension   . History of Paroxysmal atrial flutter ([place])   . Hyperlipidemia   . Leg pain, bilateral   . Nocturia   . PONV (postoperative nausea and vomiting)   . Scarlet fever 1945  . Staph skin infection    LEFT GROIN  04/11/16  TX W/ DOXYCYCLINE  . Ulcerative proctitis ([place])      CURRENT MEDICATIONS: Reviewed  Patient's Medications  New Prescriptions   No medications on file    Previous Medications   ACETAMINOPHEN PO    Take 325 mg by mouth every 6 (six) hours as needed (pain).    ASCORBIC ACID (VITAMIN C PO)    Take 500 mg by mouth daily.    ASPIRIN 81 MG TABLET    Take 81 mg by mouth daily.   CARVEDILOL (COREG) 3.125 MG TABLET    Take 3.125 mg by mouth 2 (two) times daily with a meal.    CLONAZEPAM (KLONOPIN) 0.5 MG TABLET    TAKE 1/2 TO 1 TABLET AT BEDTIME AS NEEDED.   CLOTRIMAZOLE (ANTI-FUNGAL) 1 % CREAM    Apply 1 application topically 2 (two) times daily as needed (rash).   COENZYME Q-10 PO    Take 200 mg by mouth daily.    EZETIMIBE (ZETIA) 10 MG TABLET    Take 1 tablet (10 mg total) by mouth daily.   FERROUS FUM-IRON POLYSACCH-FA 162-115.2-1 MG CAPS    Take 1 tablet by mouth daily.   LISINOPRIL (PRINIVIL,ZESTRIL) 2.5 MG TABLET    Take 1 tablet (2.5 mg total) by mouth daily.   LYRICA 100 MG CAPSULE    Take 1 capsule (100 mg total) by mouth 2 (two) times daily.   METHOCARBAMOL (ROBAXIN) 500 MG TABLET    Take 1 tablet (500 mg  total) by mouth every 6 (six) hours as needed for muscle spasms.   NITROGLYCERIN (NITROSTAT) 0.4 MG SL TABLET    Place 0.4 mg under the tongue every 5 (five) minutes as needed for chest pain.   OMEPRAZOLE (PRILOSEC OTC) 20 MG TABLET    Take 20 mg by mouth daily.   OXYCODONE-ACETAMINOPHEN (PERCOCET/ROXICET) 5-325 MG TABLET    Take 1-2 tablets by mouth every 4 (four) hours as needed for moderate pain.   POLYETHYLENE GLYCOL (MIRALAX / GLYCOLAX) PACKET    Take 17 g by mouth daily as needed for mild constipation.   SENNOSIDES-DOCUSATE SODIUM (SENOKOT-S) 8.6-50 MG TABLET    Take 2 tablets by mouth at bedtime.   SERTRALINE (ZOLOFT) 50 MG TABLET    Take 50 mg by mouth daily.   Modified Medications   No medications on file  Discontinued Medications   DOXYCYCLINE (DORYX) 100 MG EC TABLET    Take 100 mg by mouth 2 (two) times daily.     Allergies  Allergen Reactions  . Atorvastatin Other (See Comments)    unknown  . Compazine  [Prochlorperazine Edisylate] Other (See Comments)    Involuntary muscle movement   . Gemfibrozil Other (See Comments)    unknown  . Monascus Purpureus Went Yeast   . Niacin And Related     Flushing  . Rosuvastatin     Failed Crestor tiw due to leg aches (tolerated Crestor qweek and biw)  . Welchol [Colesevelam Hcl]     Leg aches  . Penicillins Nausea And Vomiting and Rash    Has patient had a PCN reaction causing immediate rash, facial/tongue/throat swelling, SOB or lightheadedness with hypotension: Yes Has patient had a PCN reaction causing severe rash involving mucus membranes or skin necrosis: No Has patient had a PCN reaction that required hospitalization No Has patient had a PCN reaction occurring within the last 10 years: No If all of the above answers are "NO", then may proceed with Cephalosporin use.      REVIEW OF SYSTEMS:  GENERAL: no change in appetite, no fatigue, no weight changes, no fever, chills or weakness EYES: Denies change in vision, dry eyes, eye pain, itching or discharge EARS: Denies change in hearing, ringing in ears, or earache NOSE: Denies nasal congestion or epistaxis MOUTH and THROAT: Denies oral discomfort, gingival pain or bleeding, pain from teeth or hoarseness   RESPIRATORY: no cough, SOB, DOE, wheezing, hemoptysis CARDIAC: no chest pain, edema or palpitations GI: no abdominal pain, diarrhea, constipation, heart burn, nausea or vomiting GU: Denies dysuria, frequency, hematuria, incontinence, or discharge PSYCHIATRIC: Denies feeling of depression or anxiety. No report of hallucinations, insomnia, paranoia, or agitation   PHYSICAL EXAMINATION  GENERAL APPEARANCE: Well nourished. In no acute distress. Obese SKIN:  3 healing surgical sites on the lumbar area and 2 surgical incisions on the right lateral abdominal wall, no erythema, dry HEAD: Normal in size and contour. No evidence of trauma EYES: Lids open and close normally. No blepharitis,  entropion or ectropion. PERRL. Conjunctivae are clear and sclerae are white. Lenses are without opacity EARS: Pinnae are normal. Patient hears normal voice tunes of the examiner MOUTH and THROAT: Lips are without lesions. Oral mucosa is moist and without lesions. Tongue is normal in shape, size, and color and without lesions NECK: supple, trachea midline, no neck masses, no thyroid tenderness, no thyromegaly LYMPHATICS: no LAN in the neck, no supraclavicular LAN RESPIRATORY: breathing is even & unlabored, BS CTAB CARDIAC: RRR, no murmur,no extra heart  sounds, no edema GI: abdomen soft, normal BS, no masses, no tenderness, no hepatomegaly, no splenomegaly EXTREMITIES:  Able to move X 4 extremities PSYCHIATRIC: Alert and oriented X 3. Affect and behavior are appropriate   LABS/RADIOLOGY: Labs reviewed: Basic Metabolic Panel:  Recent Labs  04/15/16 1012 04/29/16  NA 137 138  K 4.9 4.8  CL 104  --   CO2 26  --   GLUCOSE 88  --   BUN 20 19  CREATININE 1.27* 1.1  CALCIUM 9.2  --    Liver Function Tests:  Recent Labs  05/10/15 0805 01/18/16 0819  AST 26 34  ALT 23 22  ALKPHOS 71 65  BILITOT 0.6 0.4  PROT 6.6 6.5  ALBUMIN 4.3 4.4   CBC:  Recent Labs  04/15/16 1012 04/29/16  WBC 7.4 7.4  NEUTROABS  --  4  HGB 14.9 11.5*  HCT 44.0 35*  MCV 93.4  --   PLT 201 4*   Lipid Panel:  Recent Labs  05/10/15 0805 01/18/16 0819  HDL 23* 31*   CBG:  Recent Labs  04/19/16 0832  GLUCAP 129*      Dg Lumbar Spine 2-3 Views  Result Date: 04/18/2016 CLINICAL DATA:  L3-L5 XLIF. EXAM: DG C-ARM GT 120 MIN; LUMBAR SPINE - 2-3 VIEW FLUOROSCOPY TIME:  Fluoroscopy Time:  6 minutes 5 seconds Number of Acquired Spot Images: 4 COMPARISON:  04/19/2009 lumbar spine radiographs. FINDINGS: Four nondiagnostic spot fluoroscopic intraoperative lumbar spine radiographs demonstrate postsurgical changes from bilateral posterior spinal fusion at L3-L5. Bone cages are seen in the L3-4 and  L4-5 disc spaces. IMPRESSION: Intraoperative fluoroscopic guidance for L3-L5 lumbar spine surgery. Electronically Signed   By: Ilona Sorrel M.D.   On: 04/18/2016 13:24   Dg C-arm Gt 120 Min  Result Date: 04/18/2016 CLINICAL DATA:  L3-L5 XLIF. EXAM: DG C-ARM GT 120 MIN; LUMBAR SPINE - 2-3 VIEW FLUOROSCOPY TIME:  Fluoroscopy Time:  6 minutes 5 seconds Number of Acquired Spot Images: 4 COMPARISON:  04/19/2009 lumbar spine radiographs. FINDINGS: Four nondiagnostic spot fluoroscopic intraoperative lumbar spine radiographs demonstrate postsurgical changes from bilateral posterior spinal fusion at L3-L5. Bone cages are seen in the L3-4 and L4-5 disc spaces. IMPRESSION: Intraoperative fluoroscopic guidance for L3-L5 lumbar spine surgery. Electronically Signed   By: Ilona Sorrel M.D.   On: 04/18/2016 13:24    ASSESSMENT/PLAN:  1. Unsteady gait - for home health PT and OT, for therapeutic strengthening exercises; fall precautions   2. Spinal stenosis of lumbar region with neurogenic claudication - S/P lumbar interbody fusion, for home health PT and OT, for therapeutic strengthening exercises; follow-up with neurosurgery, continue Robaxin 500 mg 1 tab by mouth every 6 hours when necessary for muscle spasm; Percocet 5-3 25 mg 1-2 tabs by mouth every 4 hours when necessary for pain   3. Gastroesophageal reflux disease without esophagitis - continue omeprazole 20 mg 1 tab by mouth daily   4. CKD (chronic kidney disease), stage III - creatinine 1.12, stable   5. Iron deficiency anemia, unspecified iron deficiency anemia type - hgb 11.5 , stable   6. Essential hypertension - well-controlled; continue lisinopril 2.5 mg 1 tab by mouth daily and Coreg 3.125 mg 1 tab by mouth daily   7. Mild episode of recurrent major depressive disorder (HCC) - mood this is stable; continue sertraline 50 mg 1 tab by mouth daily   8. Slow transit constipation - continue MiraLAX 17 g by mouth daily when necessary and senna  S 8.6-50  mg 2 tabs by mouth daily at bedtime   9. Restless leg syndrome - continue Lyrica 100 mg 1 capsule by mouth twice a day   10. Anxiety - mood is stable; continue clonazepam 0.5 mg 1/2 tab = 0.25 mg by mouth daily at bedtime when necessary  11.  Hyperlipidemia - continue Zetia 10 mg 1 tab by mouth daily     I have filled out patient's discharge paperwork and written prescriptions.  Patient will receive home health PT, OT, Nursing and CNA.  DME provided:  None  Total discharge time: Greater than 30 minutes Greater than 50% was spent in counseling and coordination of care.   Discharge time involved coordination of the discharge process with social worker, nursing staff and therapy department. Medical justification for home health services verified.    Monina C. Alpha - NP [employer] [phone]

## 2016-06-30 ENCOUNTER — Other Ambulatory Visit: Payer: Self-pay | Admitting: Neurology

## 2016-07-01 ENCOUNTER — Other Ambulatory Visit: Payer: Self-pay | Admitting: *Deleted

## 2016-07-04 ENCOUNTER — Other Ambulatory Visit: Payer: Self-pay | Admitting: Neurosurgery

## 2016-07-04 ENCOUNTER — Ambulatory Visit
Admission: RE | Admit: 2016-07-04 | Discharge: 2016-07-04 | Disposition: A | Discharge status: Home / Self Care | Payer: Medicare Other | Source: Ambulatory Visit | Attending: Neurosurgery | Admitting: Neurosurgery

## 2016-07-04 DIAGNOSIS — M5416 Radiculopathy, lumbar region: Secondary | ICD-10-CM

## 2016-07-04 MED ORDER — GADOBENATE DIMEGLUMINE 529 MG/ML IV SOLN
20.0000 mL | Freq: Once | INTRAVENOUS | Status: AC | PRN
  Administered 2016-07-04: 20 mL via INTRAVENOUS

## 2016-07-08 ENCOUNTER — Ambulatory Visit: Payer: Medicare Other | Admitting: Neurology

## 2016-07-22 ENCOUNTER — Telehealth: Payer: Self-pay | Admitting: Cardiology

## 2016-07-22 NOTE — Telephone Encounter (Signed)
New message     Pt asking for Dr. Doug Sou nurse to call. He would not let me know what it was in regards to.

## 2016-07-22 NOTE — Telephone Encounter (Signed)
Returned call, informed pt [name] out of office - offered to assist. Pt states non-urgent , not a medical concern - requested [name] to call back when in office. May also speak to wife. Informed him I would relay msg. Pt voiced thanks.

## 2016-07-25 NOTE — Telephone Encounter (Signed)
Returned call to patient 07/23/16.He stated he wanted to let Dr.Jordan know he may need a letter for cancelling upcoming cruise.Stated he will call back if he needs letter.

## 2016-07-26 ENCOUNTER — Telehealth: Payer: Self-pay | Admitting: Cardiology

## 2016-07-26 NOTE — Telephone Encounter (Signed)
New message     Pt is calling regarding the cruise that he canceled. He would like his money back for the cruise and needs that letter from Dr. Martinique for the refund for both his wife and himself. He states he spoke with someone but cannot remember who.

## 2016-07-26 NOTE — Telephone Encounter (Signed)
Patient spoke with Malachy Mood on 2/20 about letter for cruise, message routed

## 2016-07-29 NOTE — Telephone Encounter (Signed)
Spoke to patient he stated he does need letter to get refund from cruise.Stated wife is not able to go at this time.Advised I will mail letter.

## 2016-08-01 ENCOUNTER — Other Ambulatory Visit: Payer: Self-pay | Admitting: Adult Health

## 2016-08-05 ENCOUNTER — Other Ambulatory Visit: Payer: Self-pay | Admitting: Neurosurgery

## 2016-08-06 ENCOUNTER — Ambulatory Visit (INDEPENDENT_AMBULATORY_CARE_PROVIDER_SITE_OTHER): Payer: Medicare Other | Admitting: Neurology

## 2016-08-06 ENCOUNTER — Encounter: Payer: Self-pay | Admitting: *Deleted

## 2016-08-06 ENCOUNTER — Encounter: Payer: Self-pay | Admitting: Neurology

## 2016-08-06 VITALS — BP 113/69 | HR 97 | Ht 70.0 in | Wt 246.0 lb

## 2016-08-06 DIAGNOSIS — G2581 Restless legs syndrome: Secondary | ICD-10-CM | POA: Diagnosis not present

## 2016-08-06 DIAGNOSIS — M48062 Spinal stenosis, lumbar region with neurogenic claudication: Secondary | ICD-10-CM

## 2016-08-06 MED ORDER — PREGABALIN 100 MG PO CAPS: 100.0000 mg | ORAL_CAPSULE | Freq: Two times a day (BID) | ORAL | 5 refills | Status: DC

## 2016-08-06 NOTE — Progress Notes (Signed)
PATIENT: [name] DOB: Feb 25, 1941  REASON FOR VISIT: follow up- RLS HISTORY FROM: patient  HISTORY OF PRESENT ILLNESS: HISTORY Per Dr. [name] notes:  HISTORY : [name] is a 76 yo RH male, accompanied by his wife, referred by Dr. Jacelyn Grip, and his primary care physician Dr. Leonides Schanz for evaluation of chronic low back pain, bilateral lower extremity muscle ache He had a past medical history of hypertension, hyperlipidemia, presenting with chronic low back pain, bilateral lower extremity muscle achy pain since 2010.  Initially it was thought due to his statin treatment, he has tried different statin without improving his symptoms,over the years, he also received multiple lumbar bilateral facet joint fluoroscopy guided injection without improving his symptoms.  In 2012, he was diagnosed with L4 and 5 lumbar stenosis, had lumbar laminectomy L3-4, L4-L5 by Dr. [name] in July 2012,without improving his symptoms, He was seen by different specialists, tried different medications, this including Elavil, Xanax as needed, he is currently taking hydrocodone 5/325 mg 1-2 tablets every night, Xanax 0.25 milligram as needed, Flexeril 10 mg every night, gabapentin 600 mg every night without significant improvement, During the daytime, he denies significant low back pain, or bilateral lower extremity pain, he does has history of right ankle fracture, mild gait difficulty due to right ankle pain, but starting at evening time, he began to noticed deep achy pain starting from midline low back, radiating along bilateral lateral leg, constant, he tends to pace around, has difficulty sleeping at nighttime, sometimes he has to got up take a hot bath couple times every night, put ice on his back, on his feet, only provide temporary relief. He has also tried acupuncture, massage, TENS unit, without significant improvement, previously, he has a short trial of low-dose Requip, without improving his symptoms, no  significant side effect noticed either. He has bilateral lateral 3 toes numbness, no weakness, no bilateral fingertips numbness, or weakness Per record, electrodiagnostic study failed to demonstrate significant etiology, We have reviewed MRI lumbar in 2012, there was evidence of L4-5 spinal stenosis, and the most recent MRI Lumbar was in June 2014,progression of multilevel facet arthropathy, status post a laminectomy at L3-4, with residual large synovial cyst extending along the posterior epidural space without significant central canal stenosis, mild foraminal narrowing bilaterally at L3 and 4, progression of mild lateral recess narrowing bilaterally at L4-5, foraminal narrowing is stable at this level MRI of the brain in June 2014 showed mild atrophy, no acute intracranial abnormality  UPDATE Dec 15th 2015:  He is tolerating gabapentin 300 mg 2 tablets every night, Requip 1 mg 2 tablets every night, he can sleep better with the medications, but continue complains of bilateral lower extremity dull achy pain, numbness, urgency to move, especially at nighttime, He also complains of slow progressive gait difficulty, grasps things for support, he has urinary urgency, but no incontinence.  UPDATE Jan 29th 2016: He is now taking gabapentin 300mg  2 tabs qhs and requip 1mg , 3 tabs qhs, which has been very helpful, 30%, improvement, he still has bilatarel leg pain at evening, stretching,massage, He denies bowel and bladder incontinence , he has tinlging at the soles of his feet.   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  He continue complaining significant difficulty at nighttime, came  in with a full page list of symptoms, in the morning time, he denies significant back pain, no low back pain, symptoms started in the late afternoon, gradually building up, around 9: to10 PM, he noticed achy feeling down lateral side of both leg, increased by sitting still, intense urge to lie on the floor, stretching muscle backwards, late night hot soaking bath to help him sleep, he has to take about 3 AM last night, difficulty sleeping, excessive fatigue during the daytime, could not sit through movies, standing up in the aisle 2/3 of the movie He is now taking, hydrocodone 5/325mg  2 tabs 9pm, advil 200mg  2 tab   He has quit taking Requip 1 mg, up to 4 tablets every night, also gabapentin, complains of upset stomach, without relieving his symptoms, he is now taking Advil 200 mg 2 tablets as needed, hydrocodone/Tylenol 5/325 mg 2 tablets every night for symptoms control, which only mild, temporarily, Tylenol helps some too.  UPDATE April 29th 2016: Laboratory showed low iron, ferritin level at 9, he was put on iron supplement, his restless leg symptoms has much improved, He is now taking clonazepam 0.5 milligram every night, Lyrica 50 mg every night, he can sleep much better,  He continue, combat right ankle pain  UPDATE August 2nd 2016: He sleeps well, he is now taking clonazepam 0.5mg  qhs, Lyrica 50mg  3 tabs qhs, no longer on Hydrocodone, he complains of side effect  UPDATE 07/10/15 (MM): 07/10/15: Mr. [name] is a  76 year old male with a history of restless leg syndrome. He returns today for follow-up. The patient states that since he began taking Klonopin , Lyrica and the iron supplement his symptoms have drastically improved. He states that he typically takes a hot bath before bed and this has also helped his symptoms. He does not have any of the symptoms during the day. He states that occasionally he'll have the achy feeling in his legs but nothing compared to what it was. He states that he is  now sleeping well. He states that he did decrease the Klonopin after having an episode of urinary incontinence during the night. He states that since he decreased the Klonopin he has not had this happen again. He denies any new neurological symptoms. He returns today for an evaluation.  TODAY 01/03/16  Mr. Kapral is a 76 year old male with a history of restless leg syndrome. He returns today for follow-up. He continues to get good benefit taking Lyrica Klonopin and iron. He states that he "feels the past he's felt in years." His wife just recently returned from a trip to [place]. he states he has been experiencing some back pain and plans to get an epidural steroid injection with Dr. Jacelyn Grip. He denies any new neurological symptoms. He returns today for follow-up.  UPDATE August 06 2016: I reviewed operation record on April 18 2016, he had right lumbar L3-4, L4-5 anterolateral lumbar interbody fusion, percutaneous pedicle screw on April 18 2016, which has helped his left-sided low back pain radiating pain to left lower extremity, 4. Of time he was almost pain-free, unfortunately he fell landed on his right side, is then he began to experience excruciating radiating pain from left side to left anterior thigh.  We have personally reviewed and compared to MRI lumbar in October 2017, and in February 2018. Which showed pedicle screw and interbody fusion at L3-4, L4-5 without stenosis, small fluid collection posterior to the thecal sac  at L3-4, moderate right foraminal narrowing, mild foraminal narrowing at L4-5.  He is planning on to have second left-sided lumbar decompression surgery on August 15 2016  His restless leg symptoms is under good control with current dose of Lyrica 100 mg twice a day, clonazepam as needed, oxycodone 5 mg every night  His most recent ferritin level was 47, hemoglobin was 11 point 5,  REVIEW OF SYSTEMS: Out of a complete 14 system review of symptoms, the patient complains only of  the following symptoms, and all other reviewed systems are negative.  Back pain, gait abnormality  ALLERGIES: Allergies  Allergen Reactions  . Compazine [Prochlorperazine Edisylate] Other (See Comments)    Involuntary muscle movement   . Atorvastatin Other (See Comments)    Pt states he tolerate  . Gemfibrozil Nausea Only    Pt states he tolerates   . Niacin And Related     Flushing  . Red Yeast Rice [Cholestin] Other (See Comments)    flushing  . Rosuvastatin     Failed Crestor tiw due to leg aches (tolerated Crestor qweek and biw)  . Welchol [Colesevelam Hcl]     Leg aches  . Penicillins Nausea And Vomiting and Rash    Has patient had a PCN reaction causing immediate rash, facial/tongue/throat swelling, SOB or lightheadedness with hypotension: Yes Has patient had a PCN reaction causing severe rash involving mucus membranes or skin necrosis: No Has patient had a PCN reaction that required hospitalization No Has patient had a PCN reaction occurring within the last 10 years: No If all of the above answers are "NO", then may proceed with Cephalosporin use.     HOME MEDICATIONS: Outpatient Medications Prior to Visit  Medication Sig Dispense Refill  . acetaminophen (TYLENOL) 325 MG tablet Take 650 mg by mouth every 6 (six) hours as needed.    Marland Kitchen aspirin EC 81 MG tablet Take 81 mg by mouth daily.    . carvedilol (COREG) 3.125 MG tablet Take 3.125 mg by mouth 2 (two) times daily with a meal.     . clonazePAM (KLONOPIN) 0.5 MG tablet TAKE 1/2 TO 1 TABLET AT BEDTIME AS NEEDED. (Patient taking differently: TAKE 1/2 TO 1 TABLET AT BEDTIME AS NEEDED FOR SLEEP) 30 tablet 5  . Coenzyme Q10 (COQ10 PO) Take 15 mLs by mouth daily.    Marland Kitchen ezetimibe (ZETIA) 10 MG tablet Take 1 tablet (10 mg total) by mouth daily. 30 tablet 6  . lisinopril (PRINIVIL,ZESTRIL) 2.5 MG tablet Take 1 tablet (2.5 mg total) by mouth daily. 90 tablet 3  . LYRICA 100 MG capsule TAKE (1) CAPSULE TWICE DAILY. 60 capsule 5  .  methocarbamol (ROBAXIN) 500 MG tablet Take 1 tablet (500 mg total) by mouth every 6 (six) hours as needed for muscle spasms. 60 tablet 1  . nitroGLYCERIN (NITROSTAT) 0.4 MG SL tablet Place 0.4 mg under the tongue every 5 (five) minutes as needed for chest pain.    Marland Kitchen omeprazole (PRILOSEC OTC) 20 MG tablet Take 20 mg by mouth daily.    Marland Kitchen oxyCODONE-acetaminophen (PERCOCET/ROXICET) 5-325 MG tablet Take 1-2 tablets by mouth every 4 (four) hours as needed for moderate pain. (Patient taking differently: Take 1 tablet by mouth at bedtime. ) 80 tablet 0  . polyethylene glycol (MIRALAX / GLYCOLAX) packet Take 17 g by mouth daily as needed for mild constipation.    . rosuvastatin (CRESTOR) 10 MG tablet Take 10 mg by mouth See admin instructions. 5 DAYS A WEEKS ([place]  FRIDAY)    . sennosides-docusate sodium (SENOKOT-S) 8.6-50 MG tablet Take 2 tablets by mouth at bedtime as needed for constipation.     . sertraline (ZOLOFT) 50 MG tablet Take 50 mg by mouth 2 (two) times daily.     . vitamin C (ASCORBIC ACID) 500 MG tablet Take 500 mg by mouth daily.    . IRON PO Take by mouth daily.     No facility-administered medications prior to visit.     PAST MEDICAL HISTORY: Past Medical History:  Diagnosis Date  . Arthritis   . Atrial fibrillation ([place]) 1962   a. No recurrence since 1962.  . Chest pain    a. 1998 Cath: nl cors;  b. 2009 Cath: nl cors.  . Depression   . Dysrhythmia    NO TROUBLE IN 1 YR    DR. PETER Martinique   . Esophageal reflux   . Essential hypertension   . History of Paroxysmal atrial flutter ([place])   . Hyperlipidemia   . Leg pain, bilateral   . Nocturia   . PONV (postoperative nausea and vomiting)   . Scarlet fever 1945  . Staph skin infection    LEFT GROIN  04/11/16  [place] W/ DOXYCYCLINE  . Ulcerative proctitis ([place])     PAST SURGICAL HISTORY: Past Surgical History:  Procedure Laterality Date  . ANKLE RECONSTRUCTION Right 2007   [place]  . ANTERIOR LAT LUMBAR FUSION Right  04/18/2016   Procedure: RIGHT LUMBAR THREE-FOUR, LUMBAR FOUR-FIVE ANTEROLATERAL LUMBAR INTERBODY FUSION;  Surgeon: Erline Levine, MD;  Location: [facility];  Service: Neurosurgery;  Laterality: Right;  RIGHT L3-4 L4-5 ANTEROLATERAL LUMBAR INTERBODY FUSION  . CARDIAC CATHETERIZATION  03/03/2008   Left heart cardiac catheterization and coronary  . CARDIAC CATHETERIZATION  1997   Dr Wynonia Lawman  . ESOPHAGEAL DILATION     2008  . INNER EAR SURGERY     EAR INJECTION FOR MENIERES  . KNEE ARTHROSCOPY  1991, 1995  . LUMBAR LAMINECTOMY  12/2010  . LUMBAR PERCUTANEOUS PEDICLE SCREW 2 LEVEL N/A 04/18/2016   Procedure: LUMBAR THREE-FOUR, LUMBAR FOUR-FIVE PERCUTANEOUS PEDICLE SCREW;  Surgeon: Erline Levine, MD;  Location: [facility];  Service: Neurosurgery;  Laterality: N/A;    FAMILY HISTORY: Family History  Problem Relation Age of Onset  . Prostate cancer Father   . Heart attack Brother   . Kidney cancer Brother   . Brain cancer Brother     SOCIAL HISTORY: Social History   Social History  . Marital status: Married    Spouse name: Bethena Roys  . Number of children: 3  . Years of education: college   Occupational History  .      Retired    Social History Main Topics  . Smoking status: Current Some Day Smoker    Types: Pipe  . Smokeless tobacco: Never Used     Comment: quit 1980  . Alcohol use 0.0 oz/week     Comment: 1-3 weekly  . Drug use: No  . Sexual activity: Not on file   Other Topics Concern  . Not on file   Social History Narrative   Patient is retired and lives at home with his wife Bethena Roys.    Education college.   Caffeine one cup daily.      PHYSICAL EXAM  Vitals:   08/06/16 1428  BP: 113/69  Pulse: 97  Weight: 246 lb (111.6 kg)  Height: 5\' 10"  (1.778 m)   Body mass index is 35.3 kg/m.  PHYSICAL EXAMNIATION:  Gen: NAD, conversant, well nourised, obese, well groomed                     Cardiovascular: Regular rate rhythm, no peripheral edema, warm, nontender. Eyes:  Conjunctivae clear without exudates or hemorrhage Neck: Supple, no carotid bruits. Pulmonary: Clear to auscultation bilaterally   NEUROLOGICAL EXAM:  MENTAL STATUS: Speech:    Speech is normal; fluent and spontaneous with normal comprehension.  Cognition:     Orientation to time, place and person     Normal recent and remote memory     Normal Attention span and concentration     Normal Language, naming, repeating,spontaneous speech     Fund of knowledge   CRANIAL NERVES: CN II: Visual fields are full to confrontation. Fundoscopic exam is normal with sharp discs and no vascular changes. Pupils are round equal and briskly reactive to light. CN III, IV, VI: extraocular movement are normal. No ptosis. CN V: Facial sensation is intact to pinprick in all 3 divisions bilaterally. Corneal responses are intact.  CN VII: Face is symmetric with normal eye closure and smile. CN VIII: Hearing is normal to rubbing fingers CN IX, X: Palate elevates symmetrically. Phonation is normal. CN XI: Head turning and shoulder shrug are intact CN XII: Tongue is midline with normal movements and no atrophy.  MOTOR: There is no pronator drift of out-stretched arms. Muscle bulk and tone are normal. Muscle strength is normal.  REFLEXES: Reflexes are 2+ and symmetric at the biceps, triceps, knees, and ankles. Plantar responses are flexor.  SENSORY: Intact to light touch, pinprick, positional and vibratory sensation are intact in fingers and toes.  COORDINATION: Rapid alternating movements and fine finger movements are intact. There is no dysmetria on finger-to-nose and heel-knee-shin.    GAIT/STANCE: Need push up to get up from seated position, antalgic, cautious, wide based.  DIAGNOSTIC DATA (LABS, IMAGING, TESTING) - I reviewed patient records, labs, notes, testing and imaging myself where available.  ASSESSMENT AND PLAN  Restless leg syndrome:  Refill his Lyrica 100 mg twice a day, oxycodone 5 mg  every night, clonazepam as needed  Ferritin 47, continue iron supplements  Lumbar radiculopathy  Plant to have 2nd lumbar L3-4 decompression surgery by Dr. Vertell Limber on August 15 2016     Marcial Pacas, M.D. Ph.D.  [facility] [place], [place] [address] Phone: [phone] Fax:      [phone]

## 2016-08-08 ENCOUNTER — Encounter (HOSPITAL_COMMUNITY): Payer: Self-pay

## 2016-08-08 ENCOUNTER — Encounter (HOSPITAL_COMMUNITY)
Admission: RE | Admit: 2016-08-08 | Discharge: 2016-08-08 | Disposition: A | Discharge status: Home / Self Care | Payer: Medicare Other | Source: Ambulatory Visit | Attending: Neurosurgery | Admitting: Neurosurgery

## 2016-08-08 DIAGNOSIS — E782 Mixed hyperlipidemia: Secondary | ICD-10-CM | POA: Diagnosis not present

## 2016-08-08 DIAGNOSIS — Z01812 Encounter for preprocedural laboratory examination: Secondary | ICD-10-CM | POA: Diagnosis present

## 2016-08-08 DIAGNOSIS — R7301 Impaired fasting glucose: Secondary | ICD-10-CM | POA: Insufficient documentation

## 2016-08-08 DIAGNOSIS — G2581 Restless legs syndrome: Secondary | ICD-10-CM | POA: Insufficient documentation

## 2016-08-08 DIAGNOSIS — K219 Gastro-esophageal reflux disease without esophagitis: Secondary | ICD-10-CM | POA: Insufficient documentation

## 2016-08-08 DIAGNOSIS — M48062 Spinal stenosis, lumbar region with neurogenic claudication: Secondary | ICD-10-CM | POA: Insufficient documentation

## 2016-08-08 DIAGNOSIS — M4800 Spinal stenosis, site unspecified: Secondary | ICD-10-CM | POA: Diagnosis not present

## 2016-08-08 DIAGNOSIS — I10 Essential (primary) hypertension: Secondary | ICD-10-CM | POA: Diagnosis not present

## 2016-08-08 HISTORY — DX: Chronic kidney disease, unspecified: N18.9

## 2016-08-08 LAB — SURGICAL PCR SCREEN
MRSA, PCR: NEGATIVE
Staphylococcus aureus: NEGATIVE

## 2016-08-08 LAB — BASIC METABOLIC PANEL
Anion gap: 6 (ref 5–15)
BUN: 20 mg/dL (ref 6–20)
CHLORIDE: 104 mmol/L (ref 101–111)
CO2: 29 mmol/L (ref 22–32)
CREATININE: 1.21 mg/dL (ref 0.61–1.24)
Calcium: 9.2 mg/dL (ref 8.9–10.3)
GFR, EST NON AFRICAN AMERICAN: 56 mL/min — AB (ref >60)
Glucose, Bld: 99 mg/dL (ref 65–99)
POTASSIUM: 4.6 mmol/L (ref 3.5–5.1)
SODIUM: 139 mmol/L (ref 135–145)

## 2016-08-08 LAB — CBC
HCT: 44.4 % (ref 39.0–52.0)
Hemoglobin: 14.4 g/dL (ref 13.0–17.0)
MCH: 30.7 pg (ref 26.0–34.0)
MCHC: 32.4 g/dL (ref 30.0–36.0)
MCV: 94.7 fL (ref 78.0–100.0)
PLATELETS: 144 10*3/uL — AB (ref 150–400)
RBC: 4.69 MIL/uL (ref 4.22–5.81)
RDW: 14.3 % (ref 11.5–15.5)
WBC: 5.4 10*3/uL (ref 4.0–10.5)

## 2016-08-08 NOTE — Pre-Procedure Instructions (Signed)
[name]  08/08/2016      [facility], [place] [address] Phone: [phone] Fax: [phone]    Your procedure is scheduled on .March 15  Report to [facility] at [id number].  Call this number if you have problems the morning of surgery:  [phone]   Remember:  Do not eat food or drink liquids after midnight.  Take these medicines the morning of surgery with A SIP OF WATER Tylenol if needed, Carvedilol (Coreg), Methocarbamol (Robaxin) if needed, Nitrostat if needed, Omeprazole (Prilosec), Pregabalin (Lyrica),Sertraline (Zoloft)  Stop taking aspirin, Ibuprofen, Advil, Motrin, BC's, Goody's, Herbal medications,Fihs oil   Do not wear jewelry, make-up or nail polish.  Do not wear lotions, powders, or perfumes, or deoderant.  Do not shave 48 hours prior to surgery.  Men may shave face and neck.  Do not bring valuables to the hospital.  [facility] is not responsible for any belongings or valuables.  Contacts, dentures or bridgework may not be worn into surgery.  Leave your suitcase in the car.  After surgery it may be brought to your room.  For patients admitted to the hospital, discharge time will be determined by your treatment team.  Patients discharged the day of surgery will not be allowed to drive home.    Special instructions:  [facility] - Preparing for Surgery  Before surgery, you can play an important role.  Because skin is not sterile, your skin needs to be as free of germs as possible.  You can reduce the number of germs on you skin by washing with CHG (chlorahexidine gluconate) soap before surgery.  CHG is an antiseptic cleaner which kills germs and bonds with the skin to continue killing germs even after washing.  Please DO NOT use if you have an allergy to CHG or antibacterial soaps.  If your skin becomes reddened/irritated stop using the CHG and inform  your nurse when you arrive at Short Stay.  Do not shave (including legs and underarms) for at least 48 hours prior to the first CHG shower.  You may shave your face.  Please follow these instructions carefully:   1.  Shower with CHG Soap the night before surgery and the  morning of Surgery.  2.  If you choose to wash your hair, wash your hair first as usual with your   normal shampoo.  3.  After you shampoo, rinse your hair and body thoroughly to remove the Shampoo.  4.  Use CHG as you would any other liquid soap.  You can apply chg directly to the skin and wash gently with scrungie or a clean washcloth.  5.  Apply the CHG Soap to your body ONLY FROM THE NECK DOWN.   Do not use on open wounds or open sores.  Avoid contact with your eyes,  ears, mouth and genitals (private parts).  Wash genitals (private parts)   with your normal soap.  6.  Wash thoroughly, paying special attention to the area where your surgery will be performed.  7.  Thoroughly rinse your body with warm water from the neck down.  8.  DO NOT shower/wash with your normal soap after using and rinsing off  the CHG Soap.  9.  Pat yourself dry with a clean towel.            10.  Wear clean pajamas.  11.  Place clean sheets on your bed the night of your first shower and do not sleep with pets.  Day of Surgery  Do not apply any lotions/deoderants the morning of surgery.  Please wear clean clothes to the hospital/surgery center.     Please read over the following fact sheets that you were given. Pain Booklet, Coughing and Deep Breathing, MRSA Information and Surgical Site Infection Prevention

## 2016-08-08 NOTE — Progress Notes (Signed)
PCP is Dr. Cari Caraway Cardiologist is Dr. Martinique states he saw last 6 months ago. See [facility] notes from 04-15-16 Card cath noted from 03-03-08 Denies ever having a stress test or echo. Denies having any chest pain.

## 2016-08-14 MED ORDER — SODIUM CHLORIDE 0.9 % IV SOLN
1500.0000 mg | INTRAVENOUS | Status: AC
  Administered 2016-08-15: 1500 mg via INTRAVENOUS
  Filled 2016-08-14: qty 1500

## 2016-08-15 ENCOUNTER — Inpatient Hospital Stay (HOSPITAL_COMMUNITY)
Admission: AD | Admit: 2016-08-15 | Discharge: 2016-08-20 | DRG: 519 | Disposition: A | Discharge status: Home / Self Care | Payer: Medicare Other | Source: Ambulatory Visit | Attending: Neurosurgery | Admitting: Neurosurgery

## 2016-08-15 ENCOUNTER — Ambulatory Visit (HOSPITAL_COMMUNITY): Payer: Medicare Other | Admitting: Certified Registered Nurse Anesthetist

## 2016-08-15 ENCOUNTER — Encounter (HOSPITAL_COMMUNITY): Payer: Self-pay

## 2016-08-15 ENCOUNTER — Encounter (HOSPITAL_COMMUNITY)
Admission: AD | Disposition: A | Discharge status: Home / Self Care | Payer: Self-pay | Source: Ambulatory Visit | Attending: Neurosurgery

## 2016-08-15 ENCOUNTER — Ambulatory Visit (HOSPITAL_COMMUNITY): Payer: Medicare Other

## 2016-08-15 DIAGNOSIS — G960 Cerebrospinal fluid leak: Secondary | ICD-10-CM | POA: Diagnosis present

## 2016-08-15 DIAGNOSIS — I10 Essential (primary) hypertension: Secondary | ICD-10-CM | POA: Diagnosis present

## 2016-08-15 DIAGNOSIS — S32038A Other fracture of third lumbar vertebra, initial encounter for closed fracture: Secondary | ICD-10-CM | POA: Diagnosis present

## 2016-08-15 DIAGNOSIS — R1110 Vomiting, unspecified: Secondary | ICD-10-CM

## 2016-08-15 DIAGNOSIS — K219 Gastro-esophageal reflux disease without esophagitis: Secondary | ICD-10-CM | POA: Diagnosis present

## 2016-08-15 DIAGNOSIS — R51 Headache: Secondary | ICD-10-CM | POA: Diagnosis not present

## 2016-08-15 DIAGNOSIS — Z419 Encounter for procedure for purposes other than remedying health state, unspecified: Secondary | ICD-10-CM

## 2016-08-15 DIAGNOSIS — Z888 Allergy status to other drugs, medicaments and biological substances status: Secondary | ICD-10-CM

## 2016-08-15 DIAGNOSIS — F329 Major depressive disorder, single episode, unspecified: Secondary | ICD-10-CM | POA: Diagnosis present

## 2016-08-15 DIAGNOSIS — Z7982 Long term (current) use of aspirin: Secondary | ICD-10-CM

## 2016-08-15 DIAGNOSIS — Z880 Allergy status to penicillin: Secondary | ICD-10-CM | POA: Diagnosis not present

## 2016-08-15 DIAGNOSIS — F17210 Nicotine dependence, cigarettes, uncomplicated: Secondary | ICD-10-CM | POA: Diagnosis present

## 2016-08-15 DIAGNOSIS — M48062 Spinal stenosis, lumbar region with neurogenic claudication: Secondary | ICD-10-CM | POA: Diagnosis present

## 2016-08-15 DIAGNOSIS — W19XXXA Unspecified fall, initial encounter: Secondary | ICD-10-CM | POA: Diagnosis present

## 2016-08-15 DIAGNOSIS — Z981 Arthrodesis status: Secondary | ICD-10-CM | POA: Diagnosis not present

## 2016-08-15 DIAGNOSIS — M549 Dorsalgia, unspecified: Secondary | ICD-10-CM | POA: Diagnosis present

## 2016-08-15 DIAGNOSIS — Z79899 Other long term (current) drug therapy: Secondary | ICD-10-CM

## 2016-08-15 HISTORY — PX: LUMBAR LAMINECTOMY/DECOMPRESSION MICRODISCECTOMY: SHX5026

## 2016-08-15 SURGERY — LUMBAR LAMINECTOMY/DECOMPRESSION MICRODISCECTOMY 1 LEVEL
Anesthesia: General | Laterality: Left

## 2016-08-15 MED ORDER — ASPIRIN EC 81 MG PO TBEC
81.0000 mg | DELAYED_RELEASE_TABLET | Freq: Every day | ORAL | Status: DC
  Administered 2016-08-15 – 2016-08-20 (×6): 81 mg via ORAL
  Filled 2016-08-15 (×6): qty 1

## 2016-08-15 MED ORDER — MORPHINE SULFATE (PF) 2 MG/ML IV SOLN
2.0000 mg | INTRAVENOUS | Status: DC | PRN
  Administered 2016-08-15 (×2): 2 mg via INTRAVENOUS
  Administered 2016-08-15: 4 mg via INTRAVENOUS
  Filled 2016-08-15: qty 1
  Filled 2016-08-15: qty 2
  Filled 2016-08-15: qty 1

## 2016-08-15 MED ORDER — PHENOL 1.4 % MT LIQD: 1.0000 | OROMUCOSAL | Status: DC | PRN

## 2016-08-15 MED ORDER — SERTRALINE HCL 50 MG PO TABS
50.0000 mg | ORAL_TABLET | Freq: Two times a day (BID) | ORAL | Status: DC
  Administered 2016-08-15 – 2016-08-20 (×10): 50 mg via ORAL
  Filled 2016-08-15 (×10): qty 1

## 2016-08-15 MED ORDER — OMEPRAZOLE MAGNESIUM 20 MG PO TBEC: 20.0000 mg | DELAYED_RELEASE_TABLET | Freq: Every day | ORAL | Status: DC

## 2016-08-15 MED ORDER — PROPOFOL 10 MG/ML IV BOLUS
INTRAVENOUS | Status: DC | PRN
  Administered 2016-08-15: 150 mg via INTRAVENOUS

## 2016-08-15 MED ORDER — BUPIVACAINE HCL (PF) 0.5 % IJ SOLN
INTRAMUSCULAR | Status: DC | PRN
  Administered 2016-08-15: 5 mL

## 2016-08-15 MED ORDER — POLYETHYLENE GLYCOL 3350 17 G PO PACK
17.0000 g | PACK | Freq: Every day | ORAL | Status: DC | PRN
  Administered 2016-08-16: 17 g via ORAL
  Filled 2016-08-15 (×2): qty 1

## 2016-08-15 MED ORDER — LISINOPRIL 2.5 MG PO TABS
2.5000 mg | ORAL_TABLET | Freq: Every day | ORAL | Status: DC
  Administered 2016-08-16 – 2016-08-20 (×4): 2.5 mg via ORAL
  Filled 2016-08-15 (×5): qty 1

## 2016-08-15 MED ORDER — FENTANYL CITRATE (PF) 100 MCG/2ML IJ SOLN
INTRAMUSCULAR | Status: AC
Start: 2016-08-15 — End: 2016-08-15
  Filled 2016-08-15: qty 4

## 2016-08-15 MED ORDER — FENTANYL CITRATE (PF) 100 MCG/2ML IJ SOLN
INTRAMUSCULAR | Status: AC
  Filled 2016-08-15: qty 2

## 2016-08-15 MED ORDER — DOCUSATE SODIUM 100 MG PO CAPS
100.0000 mg | ORAL_CAPSULE | Freq: Two times a day (BID) | ORAL | Status: DC
  Administered 2016-08-15 – 2016-08-20 (×10): 100 mg via ORAL
  Filled 2016-08-15 (×10): qty 1

## 2016-08-15 MED ORDER — NITROGLYCERIN 0.4 MG SL SUBL: 0.4000 mg | SUBLINGUAL_TABLET | SUBLINGUAL | Status: DC | PRN

## 2016-08-15 MED ORDER — PROPOFOL 10 MG/ML IV BOLUS
INTRAVENOUS | Status: AC
  Filled 2016-08-15: qty 40

## 2016-08-15 MED ORDER — OXYCODONE-ACETAMINOPHEN 5-325 MG PO TABS
1.0000 | ORAL_TABLET | ORAL | Status: DC | PRN
  Administered 2016-08-15 – 2016-08-18 (×8): 2 via ORAL
  Administered 2016-08-18: 1 via ORAL
  Filled 2016-08-15 (×7): qty 2
  Filled 2016-08-15: qty 1
  Filled 2016-08-15: qty 2

## 2016-08-15 MED ORDER — ROCURONIUM BROMIDE 10 MG/ML (PF) SYRINGE
PREFILLED_SYRINGE | INTRAVENOUS | Status: DC | PRN
  Administered 2016-08-15: 50 mg via INTRAVENOUS

## 2016-08-15 MED ORDER — ROSUVASTATIN CALCIUM 5 MG PO TABS
10.0000 mg | ORAL_TABLET | ORAL | Status: DC
Start: 2016-08-15 — End: 2016-08-20
  Administered 2016-08-16 – 2016-08-19 (×2): 10 mg via ORAL
  Filled 2016-08-15 (×2): qty 2

## 2016-08-15 MED ORDER — DEXAMETHASONE SODIUM PHOSPHATE 10 MG/ML IJ SOLN
INTRAMUSCULAR | Status: AC
  Filled 2016-08-15: qty 1

## 2016-08-15 MED ORDER — HYDROMORPHONE HCL 1 MG/ML IJ SOLN
0.5000 mg | INTRAMUSCULAR | Status: AC | PRN
  Administered 2016-08-15 (×4): 0.5 mg via INTRAVENOUS

## 2016-08-15 MED ORDER — LACTATED RINGERS IV SOLN
INTRAVENOUS | Status: DC
  Administered 2016-08-15: 08:00:00 via INTRAVENOUS

## 2016-08-15 MED ORDER — SENNOSIDES-DOCUSATE SODIUM 8.6-50 MG PO TABS: 2.0000 | ORAL_TABLET | Freq: Every evening | ORAL | Status: DC | PRN

## 2016-08-15 MED ORDER — ONDANSETRON HCL 4 MG PO TABS: 4.0000 mg | ORAL_TABLET | Freq: Four times a day (QID) | ORAL | Status: DC | PRN

## 2016-08-15 MED ORDER — ONDANSETRON HCL 4 MG/2ML IJ SOLN
4.0000 mg | Freq: Four times a day (QID) | INTRAMUSCULAR | Status: DC | PRN
  Administered 2016-08-17 (×2): 4 mg via INTRAVENOUS
  Filled 2016-08-15 (×2): qty 2

## 2016-08-15 MED ORDER — MENTHOL 3 MG MT LOZG: 1.0000 | LOZENGE | OROMUCOSAL | Status: DC | PRN

## 2016-08-15 MED ORDER — LIDOCAINE-EPINEPHRINE 2 %-1:100000 IJ SOLN
INTRAMUSCULAR | Status: AC
  Filled 2016-08-15: qty 1

## 2016-08-15 MED ORDER — VECURONIUM BROMIDE 10 MG IV SOLR
INTRAVENOUS | Status: DC | PRN
  Administered 2016-08-15 (×2): 2 mg via INTRAVENOUS

## 2016-08-15 MED ORDER — ONDANSETRON HCL 4 MG/2ML IJ SOLN
INTRAMUSCULAR | Status: AC
  Filled 2016-08-15: qty 2

## 2016-08-15 MED ORDER — HEMOSTATIC AGENTS (NO CHARGE) OPTIME
TOPICAL | Status: DC | PRN
  Administered 2016-08-15: 1 via TOPICAL

## 2016-08-15 MED ORDER — SODIUM CHLORIDE 0.9% FLUSH: 3.0000 mL | INTRAVENOUS | Status: DC | PRN

## 2016-08-15 MED ORDER — METHOCARBAMOL 1000 MG/10ML IJ SOLN
500.0000 mg | Freq: Four times a day (QID) | INTRAVENOUS | Status: DC | PRN
  Filled 2016-08-15: qty 5

## 2016-08-15 MED ORDER — CHLORHEXIDINE GLUCONATE CLOTH 2 % EX PADS: 6.0000 | MEDICATED_PAD | Freq: Once | CUTANEOUS | Status: DC

## 2016-08-15 MED ORDER — CLONAZEPAM 0.5 MG PO TABS
0.5000 mg | ORAL_TABLET | Freq: Three times a day (TID) | ORAL | Status: DC | PRN
  Administered 2016-08-16: 0.5 mg via ORAL
  Filled 2016-08-15: qty 1

## 2016-08-15 MED ORDER — SODIUM CHLORIDE 0.9 % IV SOLN
250.0000 mL | INTRAVENOUS | Status: DC
  Administered 2016-08-15: 250 mL via INTRAVENOUS

## 2016-08-15 MED ORDER — MEPERIDINE HCL 25 MG/ML IJ SOLN: 6.2500 mg | INTRAMUSCULAR | Status: DC | PRN

## 2016-08-15 MED ORDER — PANTOPRAZOLE SODIUM 40 MG IV SOLR
40.0000 mg | Freq: Every day | INTRAVENOUS | Status: DC
Start: 2016-08-15 — End: 2016-08-16
  Administered 2016-08-15: 40 mg via INTRAVENOUS
  Filled 2016-08-15: qty 40

## 2016-08-15 MED ORDER — LIDOCAINE 2% (20 MG/ML) 5 ML SYRINGE
INTRAMUSCULAR | Status: AC
  Filled 2016-08-15: qty 15

## 2016-08-15 MED ORDER — LIDOCAINE HCL 4 % MT SOLN
OROMUCOSAL | Status: DC | PRN
  Administered 2016-08-15: 4 mL via TOPICAL

## 2016-08-15 MED ORDER — SODIUM CHLORIDE 0.9% FLUSH
3.0000 mL | Freq: Two times a day (BID) | INTRAVENOUS | Status: DC
  Administered 2016-08-15 – 2016-08-19 (×9): 3 mL via INTRAVENOUS

## 2016-08-15 MED ORDER — PROMETHAZINE HCL 25 MG/ML IJ SOLN
INTRAMUSCULAR | Status: AC
  Filled 2016-08-15: qty 1

## 2016-08-15 MED ORDER — HYDROMORPHONE HCL 1 MG/ML IJ SOLN
INTRAMUSCULAR | Status: AC
  Filled 2016-08-15: qty 0.5

## 2016-08-15 MED ORDER — EZETIMIBE 10 MG PO TABS
10.0000 mg | ORAL_TABLET | Freq: Every day | ORAL | Status: DC
  Administered 2016-08-15 – 2016-08-20 (×6): 10 mg via ORAL
  Filled 2016-08-15 (×7): qty 1

## 2016-08-15 MED ORDER — METHOCARBAMOL 500 MG PO TABS
500.0000 mg | ORAL_TABLET | Freq: Four times a day (QID) | ORAL | Status: DC | PRN
  Filled 2016-08-15 (×2): qty 1

## 2016-08-15 MED ORDER — PREGABALIN 50 MG PO CAPS
100.0000 mg | ORAL_CAPSULE | Freq: Two times a day (BID) | ORAL | Status: DC
  Administered 2016-08-15 – 2016-08-20 (×10): 100 mg via ORAL
  Filled 2016-08-15 (×9): qty 2
  Filled 2016-08-15: qty 4

## 2016-08-15 MED ORDER — POLYETHYLENE GLYCOL 3350 17 G PO PACK: 17.0000 g | PACK | Freq: Every day | ORAL | Status: DC | PRN

## 2016-08-15 MED ORDER — HYDROMORPHONE HCL 1 MG/ML IJ SOLN: 0.2500 mg | INTRAMUSCULAR | Status: DC | PRN

## 2016-08-15 MED ORDER — ZOLPIDEM TARTRATE 5 MG PO TABS: 5.0000 mg | ORAL_TABLET | Freq: Every evening | ORAL | Status: DC | PRN

## 2016-08-15 MED ORDER — ONDANSETRON HCL 4 MG/2ML IJ SOLN
INTRAMUSCULAR | Status: DC | PRN
  Administered 2016-08-15: 4 mg via INTRAVENOUS

## 2016-08-15 MED ORDER — THROMBIN 5000 UNITS EX SOLR
CUTANEOUS | Status: AC
  Filled 2016-08-15: qty 10000

## 2016-08-15 MED ORDER — ACETAMINOPHEN 325 MG PO TABS: 650.0000 mg | ORAL_TABLET | ORAL | Status: DC | PRN

## 2016-08-15 MED ORDER — FENTANYL CITRATE (PF) 100 MCG/2ML IJ SOLN
INTRAMUSCULAR | Status: DC | PRN
  Administered 2016-08-15 (×4): 50 ug via INTRAVENOUS

## 2016-08-15 MED ORDER — VANCOMYCIN HCL IN DEXTROSE 1-5 GM/200ML-% IV SOLN
1000.0000 mg | Freq: Once | INTRAVENOUS | Status: AC
  Administered 2016-08-15: 1000 mg via INTRAVENOUS
  Filled 2016-08-15: qty 200

## 2016-08-15 MED ORDER — THROMBIN 5000 UNITS EX SOLR
CUTANEOUS | Status: DC | PRN
  Administered 2016-08-15: 10000 [IU] via TOPICAL

## 2016-08-15 MED ORDER — LIDOCAINE-EPINEPHRINE 2 %-1:100000 IJ SOLN
INTRAMUSCULAR | Status: DC | PRN
  Administered 2016-08-15: 5 mL via INTRADERMAL

## 2016-08-15 MED ORDER — SUGAMMADEX SODIUM 200 MG/2ML IV SOLN
INTRAVENOUS | Status: DC | PRN
  Administered 2016-08-15: 216 mg via INTRAVENOUS

## 2016-08-15 MED ORDER — VECURONIUM BROMIDE 10 MG IV SOLR
INTRAVENOUS | Status: AC
  Filled 2016-08-15: qty 10

## 2016-08-15 MED ORDER — PHENYLEPHRINE HCL 10 MG/ML IJ SOLN
INTRAVENOUS | Status: DC | PRN
  Administered 2016-08-15: 25 ug/min via INTRAVENOUS

## 2016-08-15 MED ORDER — DEXAMETHASONE SODIUM PHOSPHATE 10 MG/ML IJ SOLN
INTRAMUSCULAR | Status: DC | PRN
  Administered 2016-08-15: 10 mg via INTRAVENOUS

## 2016-08-15 MED ORDER — KCL IN DEXTROSE-NACL 20-5-0.45 MEQ/L-%-% IV SOLN
INTRAVENOUS | Status: DC
  Administered 2016-08-15: 21:00:00 via INTRAVENOUS
  Filled 2016-08-15 (×3): qty 1000

## 2016-08-15 MED ORDER — ACETAMINOPHEN 650 MG RE SUPP: 650.0000 mg | RECTAL | Status: DC | PRN

## 2016-08-15 MED ORDER — SODIUM CHLORIDE 0.9 % IJ SOLN
INTRAMUSCULAR | Status: AC
  Filled 2016-08-15: qty 20

## 2016-08-15 MED ORDER — LIDOCAINE 2% (20 MG/ML) 5 ML SYRINGE
INTRAMUSCULAR | Status: DC | PRN
  Administered 2016-08-15: 100 mg via INTRAVENOUS

## 2016-08-15 MED ORDER — COQ10 30 MG PO CAPS: ORAL_CAPSULE | Freq: Every day | ORAL | Status: DC

## 2016-08-15 MED ORDER — PHENYLEPHRINE 40 MCG/ML (10ML) SYRINGE FOR IV PUSH (FOR BLOOD PRESSURE SUPPORT)
PREFILLED_SYRINGE | INTRAVENOUS | Status: DC | PRN
  Administered 2016-08-15: 120 ug via INTRAVENOUS
  Administered 2016-08-15: 80 ug via INTRAVENOUS

## 2016-08-15 MED ORDER — PROMETHAZINE HCL 25 MG/ML IJ SOLN
6.2500 mg | Freq: Once | INTRAMUSCULAR | Status: AC
  Administered 2016-08-15: 6.25 mg via INTRAVENOUS

## 2016-08-15 MED ORDER — ROCURONIUM BROMIDE 50 MG/5ML IV SOSY
PREFILLED_SYRINGE | INTRAVENOUS | Status: AC
  Filled 2016-08-15: qty 5

## 2016-08-15 MED ORDER — MIDAZOLAM HCL 2 MG/2ML IJ SOLN
INTRAMUSCULAR | Status: AC
  Filled 2016-08-15: qty 2

## 2016-08-15 MED ORDER — LACTATED RINGERS IV SOLN
INTRAVENOUS | Status: DC | PRN
  Administered 2016-08-15 (×2): via INTRAVENOUS

## 2016-08-15 MED ORDER — ACETAMINOPHEN 325 MG PO TABS
650.0000 mg | ORAL_TABLET | Freq: Four times a day (QID) | ORAL | Status: DC | PRN
  Administered 2016-08-16 – 2016-08-20 (×4): 650 mg via ORAL
  Filled 2016-08-15 (×4): qty 2

## 2016-08-15 MED ORDER — OXYCODONE HCL 5 MG PO TABS
5.0000 mg | ORAL_TABLET | ORAL | Status: DC | PRN
  Administered 2016-08-16 – 2016-08-17 (×2): 10 mg via ORAL
  Filled 2016-08-15 (×3): qty 2

## 2016-08-15 MED ORDER — CARVEDILOL 3.125 MG PO TABS
3.1250 mg | ORAL_TABLET | Freq: Two times a day (BID) | ORAL | Status: DC
  Administered 2016-08-15 – 2016-08-20 (×8): 3.125 mg via ORAL
  Filled 2016-08-15 (×8): qty 1

## 2016-08-15 MED ORDER — VITAMIN C 500 MG PO TABS
500.0000 mg | ORAL_TABLET | Freq: Every day | ORAL | Status: DC
  Administered 2016-08-15 – 2016-08-20 (×6): 500 mg via ORAL
  Filled 2016-08-15 (×6): qty 1

## 2016-08-15 MED ORDER — ONDANSETRON HCL 4 MG/2ML IJ SOLN
4.0000 mg | Freq: Once | INTRAMUSCULAR | Status: AC | PRN
  Administered 2016-08-15: 4 mg via INTRAVENOUS

## 2016-08-15 MED ORDER — BISACODYL 10 MG RE SUPP
10.0000 mg | Freq: Every day | RECTAL | Status: DC | PRN
  Administered 2016-08-19: 10 mg via RECTAL
  Filled 2016-08-15: qty 1

## 2016-08-15 MED ORDER — PHENYLEPHRINE 40 MCG/ML (10ML) SYRINGE FOR IV PUSH (FOR BLOOD PRESSURE SUPPORT)
PREFILLED_SYRINGE | INTRAVENOUS | Status: AC
  Filled 2016-08-15: qty 10

## 2016-08-15 MED ORDER — FLEET ENEMA 7-19 GM/118ML RE ENEM: 1.0000 | ENEMA | Freq: Once | RECTAL | Status: DC | PRN

## 2016-08-15 MED ORDER — SUGAMMADEX SODIUM 200 MG/2ML IV SOLN
INTRAVENOUS | Status: AC
  Filled 2016-08-15: qty 2

## 2016-08-15 MED ORDER — METHOCARBAMOL 500 MG PO TABS
500.0000 mg | ORAL_TABLET | Freq: Four times a day (QID) | ORAL | Status: DC | PRN
  Administered 2016-08-15 – 2016-08-18 (×5): 500 mg via ORAL
  Filled 2016-08-15 (×3): qty 1

## 2016-08-15 MED ORDER — 0.9 % SODIUM CHLORIDE (POUR BTL) OPTIME
TOPICAL | Status: DC | PRN
  Administered 2016-08-15: 1000 mL

## 2016-08-15 SURGICAL SUPPLY — 60 items
ADH SKN CLS APL DERMABOND .7 (GAUZE/BANDAGES/DRESSINGS) ×1
BIT DRILL NEURO 2X3.1 SFT TUCH (MISCELLANEOUS) ×1 IMPLANT
BLADE CLIPPER SURG (BLADE) IMPLANT
BUR ROUND FLUTED 5 RND (BURR) ×2 IMPLANT
CANISTER SUCT 3000ML PPV (MISCELLANEOUS) ×2 IMPLANT
CARTRIDGE OIL MAESTRO DRILL (MISCELLANEOUS) ×1 IMPLANT
DECANTER SPIKE VIAL GLASS SM (MISCELLANEOUS) ×2 IMPLANT
DERMABOND ADVANCED (GAUZE/BANDAGES/DRESSINGS) ×1
DERMABOND ADVANCED .7 DNX12 (GAUZE/BANDAGES/DRESSINGS) ×1 IMPLANT
DIFFUSER DRILL AIR PNEUMATIC (MISCELLANEOUS) ×2 IMPLANT
DRAPE HALF SHEET 40X57 (DRAPES) ×2 IMPLANT
DRAPE LAPAROTOMY 100X72X124 (DRAPES) ×2 IMPLANT
DRAPE MICROSCOPE LEICA (MISCELLANEOUS) ×2 IMPLANT
DRAPE POUCH INSTRU U-SHP 10X18 (DRAPES) ×2 IMPLANT
DRAPE SURG 17X23 STRL (DRAPES) ×2 IMPLANT
DRILL NEURO 2X3.1 SOFT TOUCH (MISCELLANEOUS) ×2
DRSG OPSITE POSTOP 4X6 (GAUZE/BANDAGES/DRESSINGS) ×2 IMPLANT
DURAPREP 26ML APPLICATOR (WOUND CARE) ×2 IMPLANT
ELECT REM PT RETURN 9FT ADLT (ELECTROSURGICAL) ×2
ELECTRODE REM PT RTRN 9FT ADLT (ELECTROSURGICAL) ×1 IMPLANT
GAUZE SPONGE 4X4 12PLY STRL (GAUZE/BANDAGES/DRESSINGS) IMPLANT
GAUZE SPONGE 4X4 16PLY XRAY LF (GAUZE/BANDAGES/DRESSINGS) IMPLANT
GLOVE BIO SURGEON STRL SZ8 (GLOVE) ×2 IMPLANT
GLOVE BIOGEL PI IND STRL 8 (GLOVE) ×2 IMPLANT
GLOVE BIOGEL PI IND STRL 8.5 (GLOVE) ×2 IMPLANT
GLOVE BIOGEL PI INDICATOR 8 (GLOVE) ×2
GLOVE BIOGEL PI INDICATOR 8.5 (GLOVE) ×2
GLOVE ECLIPSE 7.0 STRL STRAW (GLOVE) ×2 IMPLANT
GLOVE ECLIPSE 8.0 STRL XLNG CF (GLOVE) ×2 IMPLANT
GLOVE EXAM NITRILE LRG STRL (GLOVE) IMPLANT
GLOVE EXAM NITRILE XL STR (GLOVE) IMPLANT
GLOVE EXAM NITRILE XS STR PU (GLOVE) IMPLANT
GLOVE INDICATOR 7.5 STRL GRN (GLOVE) ×2 IMPLANT
GOWN STRL REUS W/ TWL LRG LVL3 (GOWN DISPOSABLE) IMPLANT
GOWN STRL REUS W/ TWL XL LVL3 (GOWN DISPOSABLE) ×1 IMPLANT
GOWN STRL REUS W/TWL 2XL LVL3 (GOWN DISPOSABLE) ×2 IMPLANT
GOWN STRL REUS W/TWL LRG LVL3 (GOWN DISPOSABLE)
GOWN STRL REUS W/TWL XL LVL3 (GOWN DISPOSABLE) ×2
KIT BASIN OR (CUSTOM PROCEDURE TRAY) ×2 IMPLANT
KIT ROOM TURNOVER OR (KITS) ×2 IMPLANT
NEEDLE HYPO 18GX1.5 BLUNT FILL (NEEDLE) IMPLANT
NEEDLE HYPO 25X1 1.5 SAFETY (NEEDLE) ×2 IMPLANT
NS IRRIG 1000ML POUR BTL (IV SOLUTION) ×2 IMPLANT
OIL CARTRIDGE MAESTRO DRILL (MISCELLANEOUS) ×2
PACK LAMINECTOMY NEURO (CUSTOM PROCEDURE TRAY) ×2 IMPLANT
PAD ARMBOARD 7.5X6 YLW CONV (MISCELLANEOUS) ×6 IMPLANT
PATTIES SURGICAL .25X.25 (GAUZE/BANDAGES/DRESSINGS) ×2 IMPLANT
RUBBERBAND STERILE (MISCELLANEOUS) ×4 IMPLANT
SEALANT ADHERUS EXTEND TIP (MISCELLANEOUS) ×2 IMPLANT
SPONGE SURGIFOAM ABS GEL SZ50 (HEMOSTASIS) ×2 IMPLANT
SUT PROLENE 6 0 BV (SUTURE) ×8 IMPLANT
SUT VIC AB 0 CT1 18XCR BRD8 (SUTURE) ×1 IMPLANT
SUT VIC AB 0 CT1 8-18 (SUTURE) ×2
SUT VIC AB 2-0 CT1 18 (SUTURE) ×4 IMPLANT
SUT VIC AB 3-0 SH 8-18 (SUTURE) ×2 IMPLANT
SYR 5ML LL (SYRINGE) IMPLANT
TOWEL GREEN STERILE (TOWEL DISPOSABLE) ×2 IMPLANT
TOWEL GREEN STERILE FF (TOWEL DISPOSABLE) ×2 IMPLANT
TRAY FOLEY W/METER SILVER 16FR (SET/KITS/TRAYS/PACK) ×2 IMPLANT
WATER STERILE IRR 1000ML POUR (IV SOLUTION) ×2 IMPLANT

## 2016-08-15 NOTE — H&P (Signed)
Patient ID:   [id number] Patient: [name]  Date of Birth: August 18, 1940 Visit Type: Office Visit   Date: 07/22/2016 10:45 AM Provider: Marchia Meiers. Vertell Limber MD   This 76 year old male presents for back pain.  History of Present Illness: 1.  back pain    04/18/2016 right L3-4, L4-5 XL FX with percutaneous pedicle screws.  Patient returns for evaluation and review of MRI.  Left hip, thigh pain persist.  Physical Exam:  Hip flexion intact on the left and improved left LE strength.  07/04/16 MRI:  Mild spinal stenosis at L2-3 has progressed.  Pedical screw and interbody fusion L3-4 and L4-5 without stenosis.  Small fluid collection posterior to the thecal sac at L3-4 is similar to the prior study.  There is moderate right foraminal narrowing and mild left foraminal narrowing L4-5.  Disc facet degeneration L5-S1 without stenosis.      Medical/Surgical/Interim History Reviewed, no change.  Last detailed document date:04/03/2016.   Family History: Reviewed, no changes.  Last detailed document: 04/03/2016.   Social History: Tobacco use reviewed. Reviewed, no changes. Last detailed document date: 04/03/2016.      MEDICATIONS(added, continued or stopped this visit): Started Medication Directions Instruction Stopped   aspirin 81 mg tablet,delayed release take 1 tablet by oral route  every day    07/17/2016 clindamycin HCl 300 mg capsule take 2 capsule by oral route  60 minutes prior to procedure.     clonazepam 0.5 mg tablet take 1 tablet by oral route  every day     Coreg 3.125 mg tablet take 1 tablet by oral route 2 times every day with food     Lyrica 100 mg capsule take 1 capsule by oral route 2 times every day    06/26/2016 Medrol (Pak) 4 mg tablets in a dose pack take as directed    05/20/2016 Percocet 5 mg-325 mg tablet ake 1-2 po q6hrs prn pain     Tylenol 325 mg tablet take 1 tablet by oral route  every 4 hours as needed       ALLERGIES: Ingredient Reaction Medication Name  Comment  PROCHLORPERAZINE  Compazine   PENICILLINS Unknown        Vitals Date Temp F BP Pulse Ht In Wt Lb BMI BSA Pain Score  07/22/2016  134/80 58 70 241 34.58  4/10      IMPRESSION The patient returns to review his MRI.  He reports persistent and increased pain from his left buttock that radiates to his thigh, terminating in his knee.  The pain is not present when sitting but is when walking/moving.  His MRI shows no new areas of concern, well-positioned screws and rods with XLIF grafts, but a fractured left L3-4 facet joint that has a fragment of bone impinging on the nerve.  Confrontational testing shows improved, but still diminished hip flexion on the left and increased left LE strength.  12/16 he had a fall in the shower with a pain exacerbation that was not resolved with PT.  We discussed his prospective cruise in August and I cleared him for travel as long as he gets the surgery soon.  He will discuss surgery with his wife and then we will schedule a re-do  left L3-4 laminectomy with removal of fractured facet fragment.  Completed Orders (this encounter) Order Details Reason Side Interpretation Result Initial Treatment Date Region  Lifestyle education regarding diet Patient encouraged to eat a well balanced diet.  Hypertension education Patient to follow up with primary care provider.         Assessment/Plan # Detail Type Description   1. Assessment Body mass index (BMI) 34.0-34.9, adult (Z68.34).   Plan Orders Today's instructions / counseling include(s) Lifestyle education regarding diet.       2. Assessment Essential (primary) hypertension (I10).         Pain Assessment/Treatment Pain Scale: 4/10. Method: Numeric Pain Intensity Scale. Location: back. Onset: 06/26/2008. Duration: varies. Quality: burning, discomforting. Pain Assessment/Treatment follow-up plan of care: Patient taking medication as prescribed..  Fall Risk Plan The patient has not fallen in  the last year.  Schedule re-do laminectomy left L3-4.  Orders: Instruction(s)/Education: Assessment Instruction  I10 Hypertension education  [phone] Lifestyle education regarding diet             Provider:  Marchia Meiers. Vertell Limber MD  07/22/2016 11:04 AM Dictation edited by: Daine Gravel    CC Providers: [facility] [address] [place],  [place]  [address]-   [facility] [place], [place] [address]-              Electronically signed by Marchia Meiers. Vertell Limber MD on 07/22/2016 04:38 PM

## 2016-08-15 NOTE — Anesthesia Preprocedure Evaluation (Addendum)
Anesthesia Evaluation  Patient identified by MRN, date of birth, ID band Patient awake    Reviewed: Allergy & Precautions, NPO status , Patient's Chart, lab work & pertinent test results  History of Anesthesia Complications (+) PONV and history of anesthetic complications  Airway Mallampati: I  TM Distance: >3 FB Neck ROM: full    Dental  (+) Teeth Intact, Dental Advidsory Given   Pulmonary Current Smoker,    Pulmonary exam normal        Cardiovascular hypertension, On Medications and On Home Beta Blockers Normal cardiovascular exam     Neuro/Psych PSYCHIATRIC DISORDERS Depression    GI/Hepatic GERD  Medicated,  Endo/Other    Renal/GU Renal InsufficiencyRenal disease     Musculoskeletal   Abdominal   Peds  Hematology   Anesthesia Other Findings   Reproductive/Obstetrics                           Anesthesia Physical Anesthesia Plan  ASA: II  Anesthesia Plan: General ETT and General   Post-op Pain Management:    Induction: Intravenous  Airway Management Planned: Oral ETT  Additional Equipment:   Intra-op Plan:   Post-operative Plan: Extubation in OR  Informed Consent: I have reviewed the patients History and Physical, chart, labs and discussed the procedure including the risks, benefits and alternatives for the proposed anesthesia with the patient or authorized representative who has indicated his/her understanding and acceptance.   Dental Advisory Given  Plan Discussed with: CRNA and Surgeon  Anesthesia Plan Comments:       Anesthesia Quick Evaluation

## 2016-08-15 NOTE — Anesthesia Postprocedure Evaluation (Signed)
Anesthesia Post Note  Patient: [name]  Procedure(s) Performed: Procedure(s) (LRB): Left Left three- four Redo laminectomy (Left)  Patient location during evaluation: PACU Anesthesia Type: General Level of consciousness: awake and alert Pain management: pain level controlled Vital Signs Assessment: post-procedure vital signs reviewed and stable Respiratory status: spontaneous breathing, nonlabored ventilation, respiratory function stable and patient connected to nasal cannula oxygen Cardiovascular status: blood pressure returned to baseline and stable Postop Assessment: no signs of nausea or vomiting Anesthetic complications: no       Last Vitals:  Vitals:   08/15/16 1400 08/15/16 1416  BP:  121/68  Pulse: (!) 55 60  Resp: (!) 8 20  Temp:      Last Pain:  Vitals:   08/15/16 1345  TempSrc:   PainSc: 4                  Kamelia Lampkins DAVID

## 2016-08-15 NOTE — Interval H&P Note (Signed)
History and Physical Interval Note:  08/15/2016 9:32 AM  [name]  has presented today for surgery, with the diagnosis of Lumbar foraminal stenosis  The various methods of treatment have been discussed with the patient and family. After consideration of risks, benefits and other options for treatment, the patient has consented to  Procedure(s) with comments: Left Left three- four Redo laminectomy (Left) - Left L3-4 Redo laminectomy as a surgical intervention .  [name] history has been reviewed, patient examined, no change in status, stable for surgery.  I have reviewed the patient's chart and labs.  Questions were answered to the patient's satisfaction.     [name] D

## 2016-08-15 NOTE — Transfer of Care (Signed)
Immediate Anesthesia Transfer of Care Note  Patient: [name]  Procedure(s) Performed: Procedure(s) with comments: Left Left three- four Redo laminectomy (Left) - Left L3-4 Redo laminectomy  Patient Location: PACU  Anesthesia Type:General  Level of Consciousness: awake, alert , oriented and patient cooperative  Airway & Oxygen Therapy: Patient Spontanous Breathing and Patient connected to face mask oxygen  Post-op Assessment: Report given to RN, Post -op Vital signs reviewed and stable and Patient moving all extremities X 4  Post vital signs: Reviewed and stable  Last Vitals:  Vitals:   08/15/16 0652  BP: 129/80  Pulse: (!) 58  Resp: 20  Temp: 36.7 C    Last Pain:  Vitals:   08/15/16 0652  TempSrc: Oral      Patients Stated Pain Goal: 5 (82/99/37 1696)  Complications: No apparent anesthesia complications

## 2016-08-15 NOTE — Anesthesia Procedure Notes (Addendum)
Procedure Name: Intubation Date/Time: 08/15/2016 9:52 AM Performed by: Everlean Cherry A Pre-anesthesia Checklist: Patient identified, Emergency Drugs available, Suction available and Patient being monitored Patient Re-evaluated:Patient Re-evaluated prior to inductionOxygen Delivery Method: Circle system utilized Preoxygenation: Pre-oxygenation with 100% oxygen Intubation Type: IV induction Ventilation: Mask ventilation without difficulty and Oral airway inserted - appropriate to patient size Laryngoscope Size: Sabra Heck and 2 Grade View: Grade I Tube type: Oral Tube size: 8.0 mm Number of attempts: 1 Airway Equipment and Method: Stylet and LTA kit utilized Placement Confirmation: ETT inserted through vocal cords under direct vision and breath sounds checked- equal and bilateral Secured at: 23 cm Tube secured with: Tape Dental Injury: Teeth and Oropharynx as per pre-operative assessment

## 2016-08-15 NOTE — Op Note (Signed)
08/15/2016  12:12 PM  PATIENT:  [name]  76 y.o. 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  PROCEDURE:  Procedure(s) with comments: Left Left three- four Redo laminectomy (Left) - Left L3-4 Redo laminectomy microdissection and repair of CSF leak  SURGEON:  Surgeon(s) and Role:    * Erline Levine, MD - Primary    * Consuella Lose, MD - Assisting  PHYSICIAN ASSISTANT:   ASSISTANTS: Poteat, RN   ANESTHESIA:   general  EBL:  Total I/O In: 1000 [I.V.:1000] Out: 75 [Blood:75]  BLOOD ADMINISTERED:none  DRAINS: none   LOCAL MEDICATIONS USED:  MARCAINE    and LIDOCAINE   SPECIMEN:  No Specimen  DISPOSITION OF SPECIMEN:  N/A  COUNTS:  YES  TOURNIQUET:  * No tourniquets in log *  DICTATION: Patient has undergone previous laminectomy by Dr. [name] with known left L 3 facet fracture.  He then underwent XLIF L 34 and L 45 with percutaneous screw fixation L 3 - 5 levels.  He fell and developed worsening left leg pain and weakness.  It was elected to take him to surgery for redo L 4 L4 laminectomy.  Procedure: Patient was brought to the operating room and following the smooth and uncomplicated induction of general endotracheal anesthesia he was placed in a prone position on the Wilson frame. Low back was prepped and draped in the usual sterile fashion with betadine scrub and DuraPrep. Area of planned incision was infiltrated with local lidocaine. Incision was made in the midline and carried to the lumbodorsal fascia which was incised on the right side of midline. Subperiosteal dissection was performed exposing what was felt to be L 3 - L 4 level. Intraoperative x-ray demonstrated marker probes at L3-4. A redo  laminectomy of L 3 and L 4 was performed with leksell, then a high-speed drill and completed  with Kerrison rongeurs On opening the facet joint, I encountered CSF under pressure.  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  We then mobilized the dural edges and repaired the dural defect with 6-0 prolene sutures and obtained a water-tight closure.  We covered the dural defect with dural sealant.  At this point it was felt that all neural elements were well decompressed.  Hemostasis was assured. The lumbodorsal fascia was closed with 0 Vicryl sutures the subcutaneous tissues reapproximated 2-0 Vicryl inverted sutures and the skin edges were reapproximated with 3-0 Vicryl subcuticular stitch. The wound is dressed with Dermabond. Patient was extubated in the operating room and taken to recovery in stable and satisfactory condition having tolerated his operation well counts were correct at the end of the case.    PLAN OF CARE: Admit to inpatient   PATIENT DISPOSITION:  PACU - hemodynamically stable.   Delay start of Pharmacological VTE agent (>24hrs) due to surgical blood loss or risk of bleeding: yes

## 2016-08-15 NOTE — Brief Op Note (Signed)
08/15/2016  12:12 PM  PATIENT:  [name]  76 y.o. 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  PROCEDURE:  Procedure(s) with comments: Left Left three- four Redo laminectomy (Left) - Left L3-4 Redo laminectomy microdissection and repair of CSF leak  SURGEON:  Surgeon(s) and Role:    * Erline Levine, MD - Primary    * Consuella Lose, MD - Assisting  PHYSICIAN ASSISTANT:   ASSISTANTS: Poteat, RN   ANESTHESIA:   general  EBL:  Total I/O In: 1000 [I.V.:1000] Out: 75 [Blood:75]  BLOOD ADMINISTERED:none  DRAINS: none   LOCAL MEDICATIONS USED:  MARCAINE    and LIDOCAINE   SPECIMEN:  No Specimen  DISPOSITION OF SPECIMEN:  N/A  COUNTS:  YES  TOURNIQUET:  * No tourniquets in log *  DICTATION: Patient has undergone previous laminectomy by Dr. [name] with known left L 3 facet fracture.  He then underwent XLIF L 34 and L 45 with percutaneous screw fixation L 3 - 5 levels.  He fell and developed worsening left leg pain and weakness.  It was elected to take him to surgery for redo L 4 L4 laminectomy.  Procedure: Patient was brought to the operating room and following the smooth and uncomplicated induction of general endotracheal anesthesia he was placed in a prone position on the Wilson frame. Low back was prepped and draped in the usual sterile fashion with betadine scrub and DuraPrep. Area of planned incision was infiltrated with local lidocaine. Incision was made in the midline and carried to the lumbodorsal fascia which was incised on the right side of midline. Subperiosteal dissection was performed exposing what was felt to be L 3 - L 4 level. Intraoperative x-ray demonstrated marker probes at L3-4. A redo  laminectomy of L 3 and L 4 was performed with leksell, then a high-speed drill and completed  with Kerrison rongeurs On opening the facet joint, I encountered CSF under pressure.  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  We then mobilized the dural edges and repaired the dural defect with 6-0 prolene sutures and obtained a water-tight closure.  We covered the dural defect with dural sealant.  At this point it was felt that all neural elements were well decompressed.  Hemostasis was assured. The lumbodorsal fascia was closed with 0 Vicryl sutures the subcutaneous tissues reapproximated 2-0 Vicryl inverted sutures and the skin edges were reapproximated with 3-0 Vicryl subcuticular stitch. The wound is dressed with Dermabond. Patient was extubated in the operating room and taken to recovery in stable and satisfactory condition having tolerated his operation well counts were correct at the end of the case.    PLAN OF CARE: Admit to inpatient   PATIENT DISPOSITION:  PACU - hemodynamically stable.   Delay start of Pharmacological VTE agent (>24hrs) due to surgical blood loss or risk of bleeding: yes

## 2016-08-16 ENCOUNTER — Encounter (HOSPITAL_COMMUNITY): Payer: Self-pay | Admitting: Neurosurgery

## 2016-08-16 MED ORDER — PANTOPRAZOLE SODIUM 40 MG PO TBEC
40.0000 mg | DELAYED_RELEASE_TABLET | Freq: Every day | ORAL | Status: DC
  Administered 2016-08-16 – 2016-08-17 (×2): 40 mg via ORAL
  Filled 2016-08-16 (×2): qty 1

## 2016-08-16 NOTE — Progress Notes (Signed)
Subjective: Patient reports doing well.  Left leg pain is improved.  Dull headache, mild in severity.  Objective: Vital signs in last 24 hours: Temp:  [97.7 F (36.5 C)-98.9 F (37.2 C)] 98.2 F (36.8 C) (03/16 0535) Pulse Rate:  [54-77] 64 (03/16 0535) Resp:  [7-22] 20 (03/16 0535) BP: (90-146)/(51-82) 90/51 (03/16 0535) SpO2:  [90 %-100 %] 99 % (03/16 0535)  Intake/Output from previous day: 03/15 0701 - 03/16 0700 In: 2637.7 [I.V.:2137.7; IV Piggyback:500] Out: 2575 [Urine:2500; Blood:75] Intake/Output this shift: No intake/output data recorded.  Physical Exam: Back flat, dressing CDI.  Strength in legs full.  Lab Results: No results for input(s): WBC, HGB, HCT, PLT in the last 72 hours. BMET No results for input(s): NA, K, CL, CO2, GLUCOSE, BUN, CREATININE, CALCIUM in the last 72 hours.  Studies/Results: Dg Lumbar Spine 1 View  Result Date: 08/15/2016 CLINICAL DATA:  Lumbar laminectomy. EXAM: LUMBAR SPINE - 1 VIEW COMPARISON:  Lumbar spine MRI 07/04/2016 FINDINGS: A single cross-table lateral intraoperative radiograph of the lumbar spine is provided. Sequelae of prior L3-L5 fusion are again identified. Tissue retractors are present posteriorly at the L3 level. There are 2 surgical instruments present with tips overlying the posterior aspects of the L3 and L4 pedicle screws. IMPRESSION: Intraoperative localization during lumbar spine surgery. Electronically Signed   By: Logan Bores M.D.   On: 08/15/2016 10:38    Assessment/Plan: Patient is doing well.  Continue 3 days flat bedrest.  I explained situation in detail to patient.  He is pleased that his leg pain is gone and understands that he needs to lay flat for three days and then elevate his head of bed slowly.  He may be able to go home Sunday afternoon or Monday morning, depending on how he does with mobility.    LOS: 1 day    Peggyann Shoals, MD 08/16/2016, 8:15 AM

## 2016-08-16 NOTE — Care Management Note (Addendum)
Case Management Note  Patient Details  Name: ATANACIO MELNYK MRN: [id number] Date of Birth: 1941-03-11  Subjective/Objective:                  Patient was admitted for a redo of a lumbar laminectomy. Lives at home with spouse. CM will follow for discharge needs pending PT/OT evals and physician orders.  3/17 11:00 Addendum D Hala Narula RN CM Per neuro note, continue flat bedrest today.   Action/Plan:   Expected Discharge Date:                  Expected Discharge Plan:     In-House Referral:     Discharge planning Services     Post Acute Care Choice:    Choice offered to:     DME Arranged:    DME Agency:     HH Arranged:    HH Agency:     Status of Service:     If discussed at [employer] of Stay Meetings, dates discussed:    Additional Comments:  Rolm Baptise, RN 08/16/2016, 10:30 AM

## 2016-08-17 ENCOUNTER — Inpatient Hospital Stay (HOSPITAL_COMMUNITY): Payer: Medicare Other

## 2016-08-17 LAB — CBC
HEMATOCRIT: 36.1 % — AB (ref 39.0–52.0)
Hemoglobin: 11.8 g/dL — ABNORMAL LOW (ref 13.0–17.0)
MCH: 30.8 pg (ref 26.0–34.0)
MCHC: 32.7 g/dL (ref 30.0–36.0)
MCV: 94.3 fL (ref 78.0–100.0)
Platelets: 137 10*3/uL — ABNORMAL LOW (ref 150–400)
RBC: 3.83 MIL/uL — AB (ref 4.22–5.81)
RDW: 14.2 % (ref 11.5–15.5)
WBC: 9.1 10*3/uL (ref 4.0–10.5)

## 2016-08-17 MED ORDER — PROMETHAZINE HCL 25 MG/ML IJ SOLN
12.5000 mg | Freq: Four times a day (QID) | INTRAMUSCULAR | Status: DC | PRN
  Administered 2016-08-17: 12.5 mg via INTRAVENOUS
  Filled 2016-08-17: qty 1

## 2016-08-17 MED ORDER — PANTOPRAZOLE SODIUM 40 MG PO TBEC
40.0000 mg | DELAYED_RELEASE_TABLET | Freq: Two times a day (BID) | ORAL | Status: DC
  Administered 2016-08-17 – 2016-08-20 (×6): 40 mg via ORAL
  Filled 2016-08-17 (×6): qty 1

## 2016-08-17 NOTE — Progress Notes (Signed)
Patient vomited moderate amount dark emesis.  Call to on call MD with new orders in place.  Vital signs obtained.  Alert, oriented.  Patient resting in bed.

## 2016-08-17 NOTE — Progress Notes (Signed)
Patient ID: [name], male   DOB: 08-20-1940, 75 y.o.   MRN: [id number] Patient with still with headache  Awake alert, tension 99 blood pressure 125/59 pulse 68  Strength out of 5 incision flat no signs of drainage  Continue flat bedrest until to marked and then then slowly mobilize

## 2016-08-18 NOTE — Progress Notes (Signed)
Patient sitting up with HOB elevated 15-20 degrees with not noted distress.  Alert, verbally pleasant with no complaints of pain.  Medicated with prn for muscle spasms of back with effective results.  Spouse at bedside.

## 2016-08-18 NOTE — Progress Notes (Signed)
Patient ID: [name], male   DOB: 12-20-1940, 76 y.o.   MRN: [id number] Doing much better this morning significant improvement abdominal pain  Strength 5 out of 5 awake alert and oriented afebrile stable vital signs  Slow mobilization today

## 2016-08-19 NOTE — Care Management Important Message (Signed)
Important Message  Patient Details  Name: [name] MRN: [id number] Date of Birth: 1941/04/12   Medicare Important Message Given:  Yes    Orbie Pyo 08/19/2016, 12:15 PM

## 2016-08-19 NOTE — Progress Notes (Addendum)
Subjective: Patient reports "I was able to walk to the sink...a little dizzy, but no headache"  Objective: Vital signs in last 24 hours: Temp:  [98.1 F (36.7 C)-99.8 F (37.7 C)] 98.4 F (36.9 C) (03/19 0622) Pulse Rate:  [56-69] 56 (03/19 0622) Resp:  [17-18] 18 (03/19 0622) BP: (101-112)/(59-84) 108/73 (03/19 0622) SpO2:  [94 %-99 %] 99 % (03/19 0622)  Intake/Output from previous day: 03/18 0701 - 03/19 0700 In: 3 [I.V.:3] Out: 1250 [Urine:1250] Intake/Output this shift: No intake/output data recorded.  Alert, conversant. Reports no headache, no leg pain, no back pain, only left buttock pain with certain positions. No BM yet, but poor appetite. Good strength BLE. Incision flat without erythema or drainage beneath honeycomb & Dermabond.   Lab Results:  Recent Labs  08/17/16 1959  WBC 9.1  HGB 11.8*  HCT 36.1*  PLT 137*   BMET No results for input(s): NA, K, CL, CO2, GLUCOSE, BUN, CREATININE, CALCIUM in the last 72 hours.  Studies/Results: Dg Abd Portable 1v  Result Date: 08/17/2016 CLINICAL DATA:  Vomiting for 4 days. EXAM: PORTABLE ABDOMEN - 1 VIEW COMPARISON:  Lumbar radiograph 08/16/2014 FINDINGS: No dilated large or small bowel. There is moderate volume stool in the sigmoid colon and rectum. Gas filled sigmoid colon. Moderate volume stool in the ascending colon and transverse colon. No pathologic calcification.  No organomegaly. Posterior lumbar fusion IMPRESSION: 1. Moderate volume stool throughout the colon and rectum. 2. No evidence of bowel obstruction. Electronically Signed   By: Suzy Bouchard M.D.   On: 08/17/2016 21:13    Assessment/Plan: Improving  LOS: 4 days  D/C Foley per DrStern. order entered; mobilize today gradually.     Verdis Prime 08/19/2016, 7:58 AM  Patient doing well.   Mobilize today.

## 2016-08-20 MED ORDER — OXYCODONE-ACETAMINOPHEN 5-325 MG PO TABS: 1.0000 | ORAL_TABLET | ORAL | 0 refills | Status: DC | PRN

## 2016-08-20 MED ORDER — METHOCARBAMOL 500 MG PO TABS: 500.0000 mg | ORAL_TABLET | Freq: Four times a day (QID) | ORAL | 1 refills | Status: DC | PRN

## 2016-08-20 MED ORDER — PREGABALIN 100 MG PO CAPS: 100.0000 mg | ORAL_CAPSULE | Freq: Two times a day (BID) | ORAL | 1 refills | Status: DC

## 2016-08-20 NOTE — Progress Notes (Addendum)
Subjective: Patient reports "My left buttock pain is still there and I've had a few sharp headaches...last a few minutes"  Objective: Vital signs in last 24 hours: Temp:  [98.3 F (36.8 C)-99.6 F (37.6 C)] 98.3 F (36.8 C) (03/20 0505) Pulse Rate:  [61-110] 65 (03/20 0505) Resp:  [16-20] 18 (03/20 0505) BP: (91-118)/(55-66) 115/60 (03/20 0505) SpO2:  [94 %-99 %] 99 % (03/20 0505)  Intake/Output from previous day: 03/19 0701 - 03/20 0700 In: 240 [P.O.:240] Out: 600 [Urine:600] Intake/Output this shift: No intake/output data recorded.  Alert, conversant. Daughter and son present. Reports few sharp headaches of short duration and persistent left buttock pain. No pre-op leg pain. Incision is flat, without erythema or drainage beneath honeycomb and Dermabond. Good strength BLE.   Lab Results:  Recent Labs  08/17/16 1959  WBC 9.1  HGB 11.8*  HCT 36.1*  PLT 137*   BMET No results for input(s): NA, K, CL, CO2, GLUCOSE, BUN, CREATININE, CALCIUM in the last 72 hours.  Studies/Results: No results found.  Assessment/Plan: Improving  LOS: 5 days  Per DrStern, d/c to home. Pt verbalizes understanding of d/c instructions and agrees to call office to schedule 3-4 week f/u appt. Plan to continue Lyrica 100mg  BID for now with Percocet 5/325 q 6hrs prn pain and Robaxin 500mg  up to TID prn spasm.   Verdis Prime 08/20/2016, 7:33 AM   Patient making good progress.  Discharge home.

## 2016-08-20 NOTE — Progress Notes (Signed)
Pt discharged home with family.discharge instructions were reviewed with the pt. PT verbalized understanding.

## 2016-08-20 NOTE — Discharge Summary (Signed)
Physician Discharge Summary  Patient ID: [name] MRN: [id number] DOB/AGE: 78-03-1941 78 y.o.  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 Following redo laminectomy and dural repair, he recovered well.  He remained flat over the weekend to facilitate dura healing and mobilized slowly thereafter without difficulties. He experienced resolution of leg pain, with persistent left buttock pain.   Consults: None  Significant Diagnostic Studies: radiology: X-Ray: intra-op  Treatments: surgery: Left Left three- four Redo laminectomy (Left) - Left L3-4 Redo laminectomy microdissection and repair of CSF leak     Discharge Exam: Blood pressure 115/60, pulse 65, temperature 98.3 F (36.8 C), temperature source Oral, resp. rate 18, height 5\' 10"  (1.778 m), weight 108 kg (238 lb), SpO2 99 %. Alert, conversant. Daughter and son present. Reports few sharp headaches of short duration and persistent left buttock pain. No pre-op leg pain. Incision is flat, without erythema or drainage beneath honeycomb and Dermabond. Good strength BLE.      Disposition: Discharge to home.  Pt verbalizes understanding of d/c instructions and agrees to call office to schedule 3-4 week f/u appt. Plan to continue Lyrica 100mg  BID for now with Percocet 5/325 q 6hrs prn pain and Robaxin 500mg  up to TID prn spasm.      Allergies as of 08/20/2016      Reactions   Compazine [prochlorperazine Edisylate]  Other (See Comments)   EXTRAPYRAMIDAL MOVEMENT [Involuntary muscle movement]   Rosuvastatin Other (See Comments)   CRESTOR > MYALGIAS TOLERATES CRESTOR Q WEEK, TWICE WEEKLY   Welchol [colesevelam Hcl] Other (See Comments)   MYALGIAS [Leg aches]   Gemfibrozil Nausea Only   PATIENT TOLERATES   Penicillins Nausea And Vomiting, Rash   Has patient had a PCN reaction causing immediate rash, facial/tongue/throat swelling, SOB or lightheadedness with hypotension: #  #  #  YES  #  #  #  Has patient had a PCN reaction causing severe rash involving mucus membranes or skin necrosis: No Has patient had a PCN reaction that required hospitalization No Has patient had a PCN reaction occurring within the last 10 years: No If all of the above answers are "NO", then may proceed with Cephalosporin use.   Red Yeast Rice [cholestin] Other (See Comments)   flushing      Medication List    TAKE these medications   acetaminophen 325 MG tablet Commonly known as:  TYLENOL Take 650 mg by mouth every 6 (six) hours as needed.   aspirin EC 81 MG tablet Take 81 mg by mouth daily.   carvedilol 3.125 MG tablet Commonly known as:  COREG Take 3.125 mg by mouth 2 (two) times daily with a meal.   clonazePAM 0.5 MG tablet Commonly known as:  KLONOPIN TAKE 1/2 TO 1 TABLET AT BEDTIME AS NEEDED. What changed:  See the new instructions.   COQ10 PO Take 15 mLs by mouth daily.   ezetimibe 10 MG tablet Commonly known as:  ZETIA Take 1 tablet (10 mg total) by mouth daily.   lisinopril  2.5 MG tablet Commonly known as:  PRINIVIL,ZESTRIL Take 1 tablet (2.5 mg total) by mouth daily.   methocarbamol 500 MG tablet Commonly known as:  ROBAXIN Take 1 tablet (500 mg total) by mouth every 6 (six) hours as needed for muscle spasms. What changed:  Another medication with the same name was added. Make sure you understand how and when to take each.   methocarbamol 500 MG tablet Commonly known as:  ROBAXIN Take 1 tablet  (500 mg total) by mouth every 6 (six) hours as needed for muscle spasms. What changed:  You were already taking a medication with the same name, and this prescription was added. Make sure you understand how and when to take each.   nitroGLYCERIN 0.4 MG SL tablet Commonly known as:  NITROSTAT Place 0.4 mg under the tongue every 5 (five) minutes as needed for chest pain.   omeprazole 20 MG tablet Commonly known as:  PRILOSEC OTC Take 20 mg by mouth daily.   oxyCODONE-acetaminophen 5-325 MG tablet Commonly known as:  PERCOCET/ROXICET Take 1-2 tablets by mouth every 4 (four) hours as needed for moderate pain. What changed:  how much to take  when to take this   oxyCODONE-acetaminophen 5-325 MG tablet Commonly known as:  PERCOCET/ROXICET Take 1-2 tablets by mouth every 4 (four) hours as needed for moderate pain. What changed:  You were already taking a medication with the same name, and this prescription was added. Make sure you understand how and when to take each.   polyethylene glycol packet Commonly known as:  MIRALAX / GLYCOLAX Take 17 g by mouth daily as needed for mild constipation.   pregabalin 100 MG capsule Commonly known as:  LYRICA Take 1 capsule (100 mg total) by mouth 2 (two) times daily. What changed:  Another medication with the same name was added. Make sure you understand how and when to take each.   pregabalin 100 MG capsule Commonly known as:  LYRICA Take 1 capsule (100 mg total) by mouth 2 (two) times daily. What changed:  You were already taking a medication with the same name, and this prescription was added. Make sure you understand how and when to take each.   rosuvastatin 10 MG tablet Commonly known as:  CRESTOR Take 10 mg by mouth See admin instructions. 5 DAYS A WEEKS ([place])   sennosides-docusate sodium 8.6-50 MG tablet Commonly known as:  SENOKOT-S Take 2 tablets by mouth at bedtime as needed for constipation.   sertraline 50 MG  tablet Commonly known as:  ZOLOFT Take 50 mg by mouth 2 (two) times daily.   vitamin C 500 MG tablet Commonly known as:  ASCORBIC ACID Take 500 mg by mouth daily.        Signed: Peggyann Shoals, MD 08/20/2016, 9:15 AM

## 2016-08-20 NOTE — Care Management Note (Signed)
Case Management Note  Patient Details  Name: [name] MRN: [id number] Date of Birth: 09-03-1940  Subjective/Objective:                    Action/Plan: Patient discharging home with self care. Pt has insurance and PCP. No further needs per CM.  Expected Discharge Date:  08/20/16               Expected Discharge Plan:  Home/Self Care  In-House Referral:     Discharge planning Services     Post Acute Care Choice:    Choice offered to:     DME Arranged:    DME Agency:     HH Arranged:    HH Agency:     Status of Service:  Completed, signed off  If discussed at [employer] of Stay Meetings, dates discussed:    Additional Comments:  Pollie Friar, RN 08/20/2016, 10:26 AM

## 2016-10-15 ENCOUNTER — Other Ambulatory Visit: Payer: Self-pay | Admitting: Cardiology

## 2016-12-05 ENCOUNTER — Telehealth: Payer: Self-pay | Admitting: Neurology

## 2016-12-05 NOTE — Telephone Encounter (Signed)
Spoke to patient - he had lumbar surgery, with Dr. Vertell Limber, on 08/15/16.  He has decided to discontinue PT after not seeing much improvement after sixteen visits.  He has decided to discuss his progress with Dr. Vertell Limber first.  He has a pending appt here in September but will call back if he feels he needs it sooner.

## 2016-12-05 NOTE — Telephone Encounter (Signed)
Patient called office in reference to having a redo lumbar surgery 08/2016.  Patient would like to get opinion if anything has been completed from a neurologic side of it.  Patient states he has been taking pain medication, walking with a cane but still having difficulty walking, and doing PT with no results and having continuing pain.  Patient has not reached out to the surgeon yet due to wanting opinion from our office first. Please call

## 2017-01-12 NOTE — Progress Notes (Signed)
[name] Date of Birth: 11-May-1941 Medical Record #[id number]  History of Present Illness: Mr. Randazzo is seen for follow up of hyperlipidemia. He has a history of mixed hyperlipidemia, HTN, and obesity.  He has been intolerant to statins due to severe myalgias. This includes lipitor at a dose of 20 mg daily and Crestor 10 mg 3 days a week. Welchol apparently made no change in his lipid levels. Niacin was associated with severe flushing.   He has no history of vascular disease. He had normal cardiac caths in 1998 and 2009. No history of PAD or CVA/TIA.  He apparently had afib in 1962 but no documented recurrence.  Since his last visit with me he has undergone lumbar surgery with fusion first on the right in November 2017 and on the left in March 2018. He is still dealing with pain from his back and is being considered for spinal cord stimulator.  He is now on crestor 10 mg daily and Zetia. He is tolerating this well. He thinks that all the time in the past when he attributed his leg pain to statins it was probably his back problems all along. He notes Dr. Addison Lank increased his lisinopril on his last visit and BP improved.  Current Outpatient Prescriptions on File Prior to Visit  Medication Sig Dispense Refill  . acetaminophen (TYLENOL) 325 MG tablet Take 650 mg by mouth every 6 (six) hours as needed.    Marland Kitchen aspirin EC 81 MG tablet Take 81 mg by mouth daily.    . carvedilol (COREG) 3.125 MG tablet Take 3.125 mg by mouth 2 (two) times daily with a meal.     . clonazePAM (KLONOPIN) 0.5 MG tablet TAKE 1/2 TO 1 TABLET AT BEDTIME AS NEEDED. (Patient taking differently: TAKE 1/2 TO 1 TABLET AT BEDTIME AS NEEDED FOR SLEEP) 30 tablet 5  . Coenzyme Q10 (COQ10 PO) Take 15 mLs by mouth daily.    Marland Kitchen ezetimibe (ZETIA) 10 MG tablet Take 1 tablet (10 mg total) by mouth daily. 30 tablet 6  . methocarbamol (ROBAXIN) 500 MG tablet Take 1 tablet (500 mg total) by mouth every 6 (six) hours as needed for muscle spasms.  60 tablet 1  . nitroGLYCERIN (NITROSTAT) 0.4 MG SL tablet Place 0.4 mg under the tongue every 5 (five) minutes as needed for chest pain.    Marland Kitchen omeprazole (PRILOSEC OTC) 20 MG tablet Take 20 mg by mouth daily.    Marland Kitchen oxyCODONE-acetaminophen (PERCOCET/ROXICET) 5-325 MG tablet Take 1-2 tablets by mouth every 4 (four) hours as needed for moderate pain. 80 tablet 0  . polyethylene glycol (MIRALAX / GLYCOLAX) packet Take 17 g by mouth daily as needed for mild constipation.    . pregabalin (LYRICA) 100 MG capsule Take 1 capsule (100 mg total) by mouth 2 (two) times daily. 180 capsule 5  . rosuvastatin (CRESTOR) 10 MG tablet TAKE 1 TABLET ONCE DAILY OR AS DIRECTED. 90 tablet 0  . sennosides-docusate sodium (SENOKOT-S) 8.6-50 MG tablet Take 2 tablets by mouth at bedtime as needed for constipation.     . vitamin C (ASCORBIC ACID) 500 MG tablet Take 500 mg by mouth daily.     No current facility-administered medications on file prior to visit.     Allergies  Allergen Reactions  . Compazine [Prochlorperazine Edisylate] Other (See Comments)    EXTRAPYRAMIDAL MOVEMENT [Involuntary muscle movement]  . Rosuvastatin Other (See Comments)    CRESTOR > MYALGIAS TOLERATES CRESTOR Q WEEK, TWICE WEEKLY  . Welchol [  Colesevelam Hcl] Other (See Comments)    MYALGIAS [Leg aches]  . Gemfibrozil Nausea Only    PATIENT TOLERATES  . Penicillins Nausea And Vomiting and Rash     Has patient had a PCN reaction causing immediate rash, facial/tongue/throat swelling, SOB or lightheadedness with hypotension: #  #  #  YES  #  #  #  Has patient had a PCN reaction causing severe rash involving mucus membranes or skin necrosis: No Has patient had a PCN reaction that required hospitalization No Has patient had a PCN reaction occurring within the last 10 years: No If all of the above answers are "NO", then may proceed with Cephalosporin use.   . Red Yeast Rice [Cholestin] Other (See Comments)    flushing    Past Medical  History:  Diagnosis Date  . Arthritis   . Atrial fibrillation ([place]) 1962   a. No recurrence since 1962.  . Chest pain    a. 1998 Cath: nl cors;  b. 2009 Cath: nl cors.  . Chronic kidney disease    stage 3  . Depression   . Dysrhythmia    NO TROUBLE IN 1 YR    DR. Joycelin Radloff [place]   . Esophageal reflux   . Essential hypertension   . History of Paroxysmal atrial flutter ([place])   . Hyperlipidemia   . Leg pain, bilateral   . Nocturia   . PONV (postoperative nausea and vomiting)   . Scarlet fever 1945  . Staph skin infection    LEFT GROIN  04/11/16  [place] W/ DOXYCYCLINE  . Ulcerative proctitis [facility])     Past Surgical History:  Procedure Laterality Date  . ANKLE RECONSTRUCTION Right 2007   [place]  . ANTERIOR LAT LUMBAR FUSION Right 04/18/2016   Procedure: RIGHT LUMBAR THREE-FOUR, LUMBAR FOUR-FIVE ANTEROLATERAL LUMBAR INTERBODY FUSION;  Surgeon: Erline Levine, MD;  Location: [place];  Service: Neurosurgery;  Laterality: Right;  RIGHT L3-4 L4-5 ANTEROLATERAL LUMBAR INTERBODY FUSION  . CARDIAC CATHETERIZATION  03/03/2008   Left heart cardiac catheterization and coronary  . CARDIAC CATHETERIZATION  1997   Dr Wynonia Lawman  . ESOPHAGEAL DILATION     2008  . INNER EAR SURGERY     EAR INJECTION FOR MENIERES  . KNEE ARTHROSCOPY  1991, 1995  . LUMBAR LAMINECTOMY  12/2010  . LUMBAR LAMINECTOMY/DECOMPRESSION MICRODISCECTOMY Left 08/15/2016   Procedure: Left Left three- four Redo laminectomy;  Surgeon: Erline Levine, MD;  Location: [place];  Service: Neurosurgery;  Laterality: Left;  Left L3-4 Redo laminectomy  . LUMBAR PERCUTANEOUS PEDICLE SCREW 2 LEVEL N/A 04/18/2016   Procedure: LUMBAR THREE-FOUR, LUMBAR FOUR-FIVE PERCUTANEOUS PEDICLE SCREW;  Surgeon: Erline Levine, MD;  Location: [place];  Service: Neurosurgery;  Laterality: N/A;    History  Smoking Status  . Current Some Day Smoker  . Types: Pipe  Smokeless Tobacco  . Never Used    Comment: quit 1980    History  Alcohol Use  . 0.0 oz/week     Comment: 1-3 weekly    Family History  Problem Relation Age of Onset  . Prostate cancer Father   . Heart attack Brother   . Kidney cancer Brother   . Brain cancer Brother     Review of Systems: As noted in HPI.  All other systems were reviewed and are negative.  Physical Exam: BP 122/70   Pulse (!) 58   Ht 5\' 10"  (1.778 m)   Wt 246 lb (111.6 kg)   BMI 35.30 kg/m  He  is an obese WM in NAD. Walks with a cane. HEENT:  PERRL, EOMI, sclera are clear. Oropharynx is clear. NECK:  No jugular venous distention, carotid upstroke brisk and symmetric, no bruits, no thyromegaly or adenopathy LUNGS:  Clear to auscultation bilaterally CHEST:  Unremarkable HEART:  RRR,  PMI not displaced or sustained,S1 and S2 within normal limits, no S3, no S4: no clicks, no rubs, no murmurs ABD:  Soft, nontender. BS +, no masses or bruits. No hepatomegaly, no splenomegaly EXT:  2 + pulses throughout, no edema, no cyanosis no clubbing SKIN:  Warm and dry.  No rashes NEURO:  Alert and oriented x 3. Cranial nerves II through XII intact. PSYCH:  Cognitively intact    LABORATORY DATA: Lab Results  Component Value Date   WBC 9.1 08/17/2016   HGB 11.8 (L) 08/17/2016   HCT 36.1 (L) 08/17/2016   PLT 137 (L) 08/17/2016   GLUCOSE 99 08/08/2016   CHOL 117 (L) 01/18/2016   TRIG 107 01/18/2016   HDL 31 (L) 01/18/2016   LDLCALC 65 01/18/2016   ALT 22 01/18/2016   AST 34 01/18/2016   NA 139 08/08/2016   K 4.6 08/08/2016   CL 104 08/08/2016   CREATININE 1.21 08/08/2016   BUN 20 08/08/2016   CO2 29 08/08/2016   INR 0.9 03/02/2008   Labs dated 12/26/16: A1c 5.8%, Hgb 14.2, creatinine 1.21. TSH normal.   Ecg today shows NSR rate 58 with normal Ecg. I have personally reviewed and interpreted this study.   Assessment / Plan: 1. Mixed hyperlipidemia. Now on Crestor and Zetia and tolerating well. Will check lipid panel and HFP today.   2. HTN- controlled. Continue  lisinopril.   3. Normal cardiac cath  2009.  I will follow up in one year.

## 2017-01-13 ENCOUNTER — Encounter: Payer: Self-pay | Admitting: Cardiology

## 2017-01-13 ENCOUNTER — Ambulatory Visit (INDEPENDENT_AMBULATORY_CARE_PROVIDER_SITE_OTHER): Payer: Medicare Other | Admitting: Cardiology

## 2017-01-13 VITALS — BP 122/70 | HR 58 | Ht 70.0 in | Wt 246.0 lb

## 2017-01-13 DIAGNOSIS — I10 Essential (primary) hypertension: Secondary | ICD-10-CM | POA: Diagnosis not present

## 2017-01-13 DIAGNOSIS — E782 Mixed hyperlipidemia: Secondary | ICD-10-CM

## 2017-01-13 LAB — LIPID PANEL W/O CHOL/HDL RATIO
Cholesterol, Total: 90 mg/dL — ABNORMAL LOW (ref 100–199)
HDL: 29 mg/dL — ABNORMAL LOW (ref >39)
LDL CALC: 33 mg/dL (ref 0–99)
TRIGLYCERIDES: 138 mg/dL (ref 0–149)
VLDL CHOLESTEROL CAL: 28 mg/dL (ref 5–40)

## 2017-01-13 LAB — HEPATIC FUNCTION PANEL
ALT: 20 IU/L (ref 0–44)
AST: 26 IU/L (ref 0–40)
Albumin: 4.4 g/dL (ref 3.5–4.8)
Alkaline Phosphatase: 79 IU/L (ref 39–117)
BILIRUBIN TOTAL: 0.4 mg/dL (ref 0.0–1.2)
Bilirubin, Direct: 0.13 mg/dL (ref 0.00–0.40)
TOTAL PROTEIN: 6.3 g/dL (ref 6.0–8.5)

## 2017-01-13 MED ORDER — LISINOPRIL 2.5 MG PO TABS: 5.0000 mg | ORAL_TABLET | Freq: Every day | ORAL | 3 refills | Status: DC

## 2017-01-13 NOTE — Patient Instructions (Signed)
Continue your current therapy  We will check your lipids today  I will see you in one year

## 2017-01-14 ENCOUNTER — Other Ambulatory Visit: Payer: Self-pay | Admitting: Cardiology

## 2017-01-23 ENCOUNTER — Other Ambulatory Visit: Payer: Self-pay | Admitting: Neurology

## 2017-02-06 ENCOUNTER — Ambulatory Visit (INDEPENDENT_AMBULATORY_CARE_PROVIDER_SITE_OTHER): Payer: Medicare Other | Admitting: Adult Health

## 2017-02-06 ENCOUNTER — Encounter: Payer: Self-pay | Admitting: Adult Health

## 2017-02-06 VITALS — BP 116/73 | HR 65 | Wt 239.4 lb

## 2017-02-06 DIAGNOSIS — G2581 Restless legs syndrome: Secondary | ICD-10-CM | POA: Diagnosis not present

## 2017-02-06 DIAGNOSIS — M5416 Radiculopathy, lumbar region: Secondary | ICD-10-CM

## 2017-02-06 MED ORDER — CLONAZEPAM 0.5 MG PO TABS: ORAL_TABLET | ORAL | 5 refills | Status: DC

## 2017-02-06 NOTE — Patient Instructions (Signed)
Your Plan:  Continue Klonopin and Lyrica  If your symptoms worsen or you develop new symptoms please let us know.     Thank you for coming to see [place] at [facility]. I hope we have been able to provide you high quality care today.  You may receive a patient satisfaction survey over the next few weeks. We would appreciate your feedback and comments so that we may continue to improve ourselves and the health of our patients.

## 2017-02-06 NOTE — Progress Notes (Signed)
PATIENT: [name] DOB: 13-Oct-1940  REASON FOR VISIT: follow up HISTORY FROM: patient  HISTORY OF PRESENT ILLNESS: HISTORY Per Dr. [name] notes: HISTORY : [name] is a 76 yo RH male, accompanied by his wife, referred by Dr. Jacelyn Grip, and his primary care physician Dr. Leonides Schanz for evaluation of chronic low back pain, bilateral lower extremity muscle ache He had a past medical history of hypertension, hyperlipidemia, presenting with chronic low back pain, bilateral lower extremity muscle achy pain since 2010.  Initially it was thought due to his statin treatment, he has tried different statin without improving his symptoms,over the years, he also received multiple lumbar bilateral facet joint fluoroscopy guided injection without improving his symptoms.  In 2012, he was diagnosed with L4 and 5 lumbar stenosis, had lumbar laminectomy L3-4, L4-L5 by Dr. [name] in July 2012,without improving his symptoms, He was seen by different specialists, tried different medications, this including Elavil, Xanax as needed, he is currently taking hydrocodone 5/325 mg 1-2 tablets every night, Xanax 0.25 milligram as needed, Flexeril 10 mg every night, gabapentin 600 mg every night without significant improvement, During the daytime, he denies significant low back pain, or bilateral lower extremity pain, he does has history of right ankle fracture, mild gait difficulty due to right ankle pain, but starting at evening time, he began to noticed deep achy pain starting from midline low back, radiating along bilateral lateral leg, constant, he tends to pace around, has difficulty sleeping at nighttime, sometimes he has to got up take a hot bath couple times every night, put ice on his back, on his feet, only provide temporary relief. He has also tried acupuncture, massage, TENS unit, without significant improvement, previously, he has a short trial of low-dose Requip, without improving his symptoms, no significant  side effect noticed either. He has bilateral lateral 3 toes numbness, no weakness, no bilateral fingertips numbness, or weakness Per record, electrodiagnostic study failed to demonstrate significant etiology, We have reviewed MRI lumbar in 2012, there was evidence of L4-5 spinal stenosis, and the most recent MRI Lumbar was in June 2014,progression of multilevel facet arthropathy, status post a laminectomy at L3-4, with residual large synovial cyst extending along the posterior epidural space without significant central canal stenosis, mild foraminal narrowing bilaterally at L3 and 4, progression of mild lateral recess narrowing bilaterally at L4-5, foraminal narrowing is stable at this level MRI of the brain in June 2014 showed mild atrophy, no acute intracranial abnormality  UPDATE Dec 15th 2015:  He is tolerating gabapentin 300 mg 2 tablets every night, Requip 1 mg 2 tablets every night, he can sleep better with the medications, but continue complains of bilateral lower extremity dull achy pain, numbness, urgency to move, especially at nighttime, He also complains of slow progressive gait difficulty, grasps things for support, he has urinary urgency, but no incontinence.  UPDATE Jan 29th 2016: He is now taking gabapentin 300mg  2 tabs qhs and requip 1mg , 3 tabs qhs, which has been very helpful, 30%, improvement, he still has bilatarel leg pain at evening, stretching,massage, He denies bowel and bladder incontinence , he has tinlging at the soles of his feet.   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  He continue complaining significant difficulty at nighttime, came in with a  full page list of symptoms, in the morning time, he denies significant back pain, no low back pain, symptoms started in the late afternoon, gradually building up, around 9: to10 PM, he noticed achy feeling down lateral side of both leg, increased by sitting still, intense urge to lie on the floor, stretching muscle backwards, late night hot soaking bath to help him sleep, he has to take about 3 AM last night, difficulty sleeping, excessive fatigue during the daytime, could not sit through movies, standing up in the aisle 2/3 of the movie He is now taking, hydrocodone 5/325mg  2 tabs 9pm, advil 200mg  2 tab   He has quit taking Requip 1 mg, up to 4 tablets every night, also gabapentin, complains of upset stomach, without relieving his symptoms, he is now taking Advil 200 mg 2 tablets as needed, hydrocodone/Tylenol 5/325 mg 2 tablets every night for symptoms control, which only mild, temporarily, Tylenol helps some too.  UPDATE April 29th 2016:Laboratory showed low iron, ferritin level at 9, he was put on iron supplement, his restless leg symptoms has much improved, He is now taking clonazepam 0.5 milligram every night, Lyrica 50 mg every night, he can sleep much better,  He continue, combat right ankle pain  UPDATE August 2nd 2016: He sleeps well, he is now taking clonazepam 0.5mg  qhs, Lyrica 50mg  3 tabs qhs, no longer on Hydrocodone, he complains of side effect  UPDATE 07/10/15 (MM): 07/10/15: [name] is a 76 year old male with a history of restless leg syndrome. He returns today for follow-up. The patient states that since he began taking Klonopin , Lyrica and the iron supplement his symptoms have drastically improved. He states that he typically takes a hot bath before bed and this has also helped his symptoms. He does not have any of the symptoms during the day. He states that occasionally he'll have the achy feeling in his legs but nothing compared to what it was. He states that he is now  sleeping well. He states that he did decrease the Klonopin after having an episode of urinary incontinence during the night. He states that since he decreased the Klonopin he has not had this happen again. He denies any new neurological symptoms. He returns today for an evaluation.  UDATE 01/03/16  [name] is a 76 year old male with a history of restless leg syndrome. He returns today for follow-up. He continues to get good benefit taking Lyrica Klonopin and iron. He states that he "feels the past he's felt in years." His wife just recently returned from a trip to [place]. he states he has been experiencing some back pain and plans to get an epidural steroid injection with Dr. Jacelyn Grip. He denies any new neurological symptoms. He returns today for follow-up.  UPDATE August 06 2016: I reviewed operation record on April 18 2016, he had right lumbar L3-4, L4-5 anterolateral lumbar interbody fusion, percutaneous pedicle screw on April 18 2016, which has helped his left-sided low back pain radiating pain to left lower extremity, 4. Of time he was almost pain-free, unfortunately he fell landed on his right side, is then he began to experience excruciating radiating pain from left side to left anterior thigh.  We have personally reviewed and compared to MRI lumbar in October 2017, and in February 2018. Which showed pedicle screw and interbody fusion at L3-4, L4-5 without stenosis, small fluid collection posterior to the thecal sac at L3-4, moderate right foraminal  narrowing, mild foraminal narrowing at L4-5.  He is planning on to have second left-sided lumbar decompression surgery on August 15 2016  His restless leg symptoms is under good control with current dose of Lyrica 100 mg twice a day, clonazepam as needed, oxycodone 5 mg every night  His most recent ferritin level was 47, hemoglobin was 11 point 5,  Today 02/06/17 [name] is a 76 year old male with a history of restless leg symptoms and  back pain. He returns today for follow-up. He states that his restless legs have been under good control with Lyrica and Klonopin. Reports that he sleeps very well with Klonopin. He had back surgery in March. He states that the pain has slowly improved however he does have difficulty walking. He also reports pain down the right leg. He did follow up with Dr. Vertell Limber who referred him to Dr. Maryjean Ka for pain management. He reports that he has received one injection but it only offers benefit for several days. He returns today for an evaluation.  REVIEW OF SYSTEMS: Out of a complete 14 system review of symptoms, the patient complains only of the following symptoms, and all other reviewed systems are negative.  Incontinence of bladder, walking difficulty, depression, restless leg, ringing in ears  ALLERGIES: Allergies  Allergen Reactions  . Compazine [Prochlorperazine Edisylate] Other (See Comments)    EXTRAPYRAMIDAL MOVEMENT [Involuntary muscle movement]  . Welchol [Colesevelam Hcl] Other (See Comments)    MYALGIAS [Leg aches]  . Gemfibrozil Nausea Only    PATIENT TOLERATES  . Penicillins Nausea And Vomiting and Rash     Has patient had a PCN reaction causing immediate rash, facial/tongue/throat swelling, SOB or lightheadedness with hypotension: #  #  #  YES  #  #  #  Has patient had a PCN reaction causing severe rash involving mucus membranes or skin necrosis: No Has patient had a PCN reaction that required hospitalization No Has patient had a PCN reaction occurring within the last 10 years: No If all of the above answers are "NO", then may proceed with Cephalosporin use.   . Red Yeast Rice [Cholestin] Other (See Comments)    flushing    HOME MEDICATIONS: Outpatient Medications Prior to Visit  Medication Sig Dispense Refill  . acetaminophen (TYLENOL) 325 MG tablet Take 650 mg by mouth every 6 (six) hours as needed.    Marland Kitchen aspirin EC 81 MG tablet Take 81 mg by mouth daily.    . carvedilol  (COREG) 3.125 MG tablet Take 3.125 mg by mouth 2 (two) times daily with a meal.     . clonazePAM (KLONOPIN) 0.5 MG tablet TAKE 1/2 TO 1 TABLET AT BEDTIME AS NEEDED. 30 tablet 5  . Coenzyme Q10 (COQ10 PO) Take 15 mLs by mouth daily.    Marland Kitchen ezetimibe (ZETIA) 10 MG tablet Take 1 tablet (10 mg total) by mouth daily. 30 tablet 6  . lisinopril (PRINIVIL,ZESTRIL) 2.5 MG tablet Take 2 tablets (5 mg total) by mouth daily. (Patient taking differently: Take 10 mg by mouth daily. ) 90 tablet 3  . meloxicam (MOBIC) 15 MG tablet Take 15 mg by mouth daily.    . methocarbamol (ROBAXIN) 500 MG tablet Take 1 tablet (500 mg total) by mouth every 6 (six) hours as needed for muscle spasms. 60 tablet 1  . nitroGLYCERIN (NITROSTAT) 0.4 MG SL tablet Place 0.4 mg under the tongue every 5 (five) minutes as needed for chest pain.    Marland Kitchen omeprazole (PRILOSEC OTC) 20 MG tablet  Take 20 mg by mouth daily.    Marland Kitchen oxyCODONE-acetaminophen (PERCOCET/ROXICET) 5-325 MG tablet Take 1-2 tablets by mouth every 4 (four) hours as needed for moderate pain. 80 tablet 0  . polyethylene glycol (MIRALAX / GLYCOLAX) packet Take 17 g by mouth daily as needed for mild constipation.    . pregabalin (LYRICA) 100 MG capsule Take 1 capsule (100 mg total) by mouth 2 (two) times daily. 180 capsule 5  . rosuvastatin (CRESTOR) 10 MG tablet TAKE 1 TABLET ONCE DAILY OR AS DIRECTED. 90 tablet 0  . sennosides-docusate sodium (SENOKOT-S) 8.6-50 MG tablet Take 2 tablets by mouth at bedtime as needed for constipation.     . vitamin C (ASCORBIC ACID) 500 MG tablet Take 500 mg by mouth daily.    . sertraline (ZOLOFT) 50 MG tablet Take 50 mg by mouth daily.     No facility-administered medications prior to visit.     PAST MEDICAL HISTORY: Past Medical History:  Diagnosis Date  . Arthritis   . Atrial fibrillation ([place]) 1962   a. No recurrence since 1962.  . Chest pain    a. 1998 Cath: nl cors;  b. 2009 Cath: nl cors.  . Chronic kidney disease    stage 3  .  Depression   . Dysrhythmia    NO TROUBLE IN 1 YR    DR. PETER Martinique   . Esophageal reflux   . Essential hypertension   . History of Paroxysmal atrial flutter ([place])   . Hyperlipidemia   . Leg pain, bilateral   . Nocturia   . PONV (postoperative nausea and vomiting)   . Scarlet fever 1945  . Spinal stenosis   . Staph skin infection    LEFT GROIN  04/11/16  TX W/ DOXYCYCLINE  . Ulcerative proctitis ([place])     PAST SURGICAL HISTORY: Past Surgical History:  Procedure Laterality Date  . ANKLE RECONSTRUCTION Right 2007   [place]  . ANTERIOR LAT LUMBAR FUSION Right 04/18/2016   Procedure: RIGHT LUMBAR THREE-FOUR, LUMBAR FOUR-FIVE ANTEROLATERAL LUMBAR INTERBODY FUSION;  Surgeon: Erline Levine, MD;  Location: [facility];  Service: Neurosurgery;  Laterality: Right;  RIGHT L3-4 L4-5 ANTEROLATERAL LUMBAR INTERBODY FUSION  . CARDIAC CATHETERIZATION  03/03/2008   Left heart cardiac catheterization and coronary  . CARDIAC CATHETERIZATION  1997   Dr Wynonia Lawman  . ESOPHAGEAL DILATION     2008  . INNER EAR SURGERY     EAR INJECTION FOR MENIERES  . KNEE ARTHROSCOPY  1991, 1995  . LUMBAR LAMINECTOMY  12/2010  . LUMBAR LAMINECTOMY/DECOMPRESSION MICRODISCECTOMY Left 08/15/2016   Procedure: Left Left three- four Redo laminectomy;  Surgeon: Erline Levine, MD;  Location: [facility];  Service: Neurosurgery;  Laterality: Left;  Left L3-4 Redo laminectomy  . LUMBAR PERCUTANEOUS PEDICLE SCREW 2 LEVEL N/A 04/18/2016   Procedure: LUMBAR THREE-FOUR, LUMBAR FOUR-FIVE PERCUTANEOUS PEDICLE SCREW;  Surgeon: Erline Levine, MD;  Location: [facility];  Service: Neurosurgery;  Laterality: N/A;    FAMILY HISTORY: Family History  Problem Relation Age of Onset  . Prostate cancer Father   . Heart attack Brother   . Kidney cancer Brother   . Brain cancer Brother     SOCIAL HISTORY: Social History   Social History  . Marital status: Married    Spouse name: Bethena Roys  . Number of children: 3  . Years of education: college    Occupational History  .      Retired    Social History Main Topics  . Smoking status: Current  Some Day Smoker    Types: Pipe  . Smokeless tobacco: Never Used  . Alcohol use 0.6 oz/week    1 Shots of liquor per week     Comment: 1-3 weekly  . Drug use: No  . Sexual activity: Not on file   Other Topics Concern  . Not on file   Social History Narrative   Patient is retired and lives at home with his wife Bethena Roys.    Education college.   Caffeine one cup daily.      PHYSICAL EXAM  Vitals:   02/06/17 1421  BP: 116/73  Pulse: 65  Weight: 239 lb 6.4 oz (108.6 kg)   Body mass index is 34.35 kg/m.  Generalized: Well developed, in no acute distress   Neurological examination  Mentation: Alert oriented to time, place, history taking. Follows all commands speech and language fluent Cranial nerve II-XII: Pupils were equal round reactive to light. Extraocular movements were full, visual field were full on confrontational test. Facial sensation and strength were normal. Uvula tongue midline. Head turning and shoulder shrug  were normal and symmetric. Motor: The motor testing reveals 5 over 5 strength of all 4 extremities. Good symmetric motor tone is noted throughout.  Sensory: Sensory testing is intact to soft touch on all 4 extremities. No evidence of extinction is noted.  Coordination: Cerebellar testing reveals good finger-nose-finger and heel-to-shin bilaterally.  Gait and station: Gait is normal. Tandem gait is normal. Romberg is negative. No drift is seen.  Reflexes: Deep tendon reflexes are symmetric and normal bilaterally.   DIAGNOSTIC DATA (LABS, IMAGING, TESTING) - I reviewed patient records, labs, notes, testing and imaging myself where available.  Lab Results  Component Value Date   WBC 9.1 08/17/2016   HGB 11.8 (L) 08/17/2016   HCT 36.1 (L) 08/17/2016   MCV 94.3 08/17/2016   PLT 137 (L) 08/17/2016      Component Value Date/Time   NA 139 08/08/2016 1022    NA 138 04/29/2016   K 4.6 08/08/2016 1022   CL 104 08/08/2016 1022   CO2 29 08/08/2016 1022   GLUCOSE 99 08/08/2016 1022   BUN 20 08/08/2016 1022   BUN 19 04/29/2016   CREATININE 1.21 08/08/2016 1022   CALCIUM 9.2 08/08/2016 1022   PROT 6.3 01/13/2017 0846   ALBUMIN 4.4 01/13/2017 0846   AST 26 01/13/2017 0846   ALT 20 01/13/2017 0846   ALKPHOS 79 01/13/2017 0846   BILITOT 0.4 01/13/2017 0846   GFRNONAA 56 (L) 08/08/2016 1022   GFRAA >60 08/08/2016 1022   Lab Results  Component Value Date   CHOL 90 (L) 01/13/2017   HDL 29 (L) 01/13/2017   LDLCALC 33 01/13/2017   TRIG 138 01/13/2017   CHOLHDL 3.8 01/18/2016      ASSESSMENT AND PLAN 76 y.o. year old male  has a past medical history of Arthritis; Atrial fibrillation ([place]) (1962); Chest pain; Chronic kidney disease; Depression; Dysrhythmia; Esophageal reflux; Essential hypertension; History of Paroxysmal atrial flutter ([place]); Hyperlipidemia; Leg pain, bilateral; Nocturia; PONV (postoperative nausea and vomiting); Scarlet fever (1945); Spinal stenosis; Staph skin infection; and Ulcerative proctitis ([place]). here with:  1. Restless leg syndrome 2. Lumbar radiculopathy  [name] restless legs is under good control with Lyrica and Klonopin. He will continue these. I will refill Klonopin today. He will continue follow-up with Dr. Maryjean Ka and Dr. Vertell Limber for lumbar radiculopathy. He is advised that if his symptoms worsen or he develops new symptoms he should let us know.  He will follow-up in 6 months or sooner if needed     Ward Givens, MSN, NP-C 02/06/2017, 2:35 PM [facility] [address], [place], [place] [address] [phone]

## 2017-02-07 NOTE — Progress Notes (Signed)
I have reviewed and agreed above plan. 

## 2017-03-12 ENCOUNTER — Other Ambulatory Visit: Payer: Self-pay | Admitting: Neurology

## 2017-04-11 NOTE — Pre-Procedure Instructions (Signed)
[name]  04/11/2017      [place], [place] [address] Phone: [phone] Fax: [phone]    Your procedure is scheduled on Tuesday November 20.  Report to [facility] Admitting at 5:30 A.M.  Call this number if you have problems the morning of surgery:  [phone]   Remember:  Do not eat food or drink liquids after midnight.  Take these medicines the morning of surgery with A SIP OF WATER:  Carvedilol (coreg) Duloxetine (cymbalta) Omeprazole (prilosec) Oxycodone-acetaminophen (percocet) OR Acetaminophen (tylenol) if needed  7 days prior to surgery STOP taking any Aspirin (unless otherwise instructed by your surgeon), Aleve, Naproxen, Ibuprofen, Motrin, Advil, Goody's, BC's, all herbal medications, fish oil, and all vitamins     Do not wear jewelry, make-up or nail polish.  Do not wear lotions, powders, or perfumes, or deoderant.  Do not shave 48 hours prior to surgery.  Men may shave face and neck.  Do not bring valuables to the hospital.  [facility] is not responsible for any belongings or valuables.  Contacts, dentures or bridgework may not be worn into surgery.  Leave your suitcase in the car.  After surgery it may be brought to your room.  For patients admitted to the hospital, discharge time will be determined by your treatment team.  Patients discharged the day of surgery will not be allowed to drive home.   Special instructions:    [facility]- Preparing For Surgery  Before surgery, you can play an important role. Because skin is not sterile, your skin needs to be as free of germs as possible. You can reduce the number of germs on your skin by washing with CHG (chlorahexidine gluconate) Soap before surgery.  CHG is an antiseptic cleaner which kills germs and bonds with the skin to continue killing germs even after washing.  Please do not use if you have  an allergy to CHG or antibacterial soaps. If your skin becomes reddened/irritated stop using the CHG.  Do not shave (including legs and underarms) for at least 48 hours prior to first CHG shower. It is OK to shave your face.  Please follow these instructions carefully.   1. Shower the NIGHT BEFORE SURGERY and the MORNING OF SURGERY with CHG.   2. If you chose to wash your hair, wash your hair first as usual with your normal shampoo.  3. After you shampoo, rinse your hair and body thoroughly to remove the shampoo.  4. Use CHG as you would any other liquid soap. You can apply CHG directly to the skin and wash gently with a scrungie or a clean washcloth.   5. Apply the CHG Soap to your body ONLY FROM THE NECK DOWN.  Do not use on open wounds or open sores. Avoid contact with your eyes, ears, mouth and genitals (private parts). Wash Face and genitals (private parts)  with your normal soap.  6. Wash thoroughly, paying special attention to the area where your surgery will be performed.  7. Thoroughly rinse your body with warm water from the neck down.  8. DO NOT shower/wash with your normal soap after using and rinsing off the CHG Soap.  9. Pat yourself dry with a CLEAN TOWEL.  10. Wear CLEAN PAJAMAS to bed the night before surgery, wear comfortable clothes the morning of surgery  11. Place CLEAN SHEETS on your bed the night of  your first shower and DO NOT SLEEP WITH PETS.    Day of Surgery: Do not apply any deodorants/lotions. Please wear clean clothes to the hospital/surgery center.      Please read over the following fact sheets that you were given. Coughing and Deep Breathing, Total Joint Packet, MRSA Information and Surgical Site Infection Prevention

## 2017-04-14 ENCOUNTER — Other Ambulatory Visit: Payer: Self-pay

## 2017-04-14 ENCOUNTER — Encounter (HOSPITAL_COMMUNITY)
Admission: RE | Admit: 2017-04-14 | Discharge: 2017-04-14 | Disposition: A | Discharge status: Home / Self Care | Payer: Medicare Other | Source: Ambulatory Visit | Attending: Orthopaedic Surgery | Admitting: Orthopaedic Surgery

## 2017-04-14 ENCOUNTER — Ambulatory Visit (HOSPITAL_COMMUNITY)
Admission: RE | Admit: 2017-04-14 | Discharge: 2017-04-14 | Disposition: A | Discharge status: Home / Self Care | Payer: Medicare Other | Source: Ambulatory Visit | Attending: Orthopaedic Surgery | Admitting: Orthopaedic Surgery

## 2017-04-14 ENCOUNTER — Encounter (HOSPITAL_COMMUNITY): Payer: Self-pay

## 2017-04-14 DIAGNOSIS — Z01818 Encounter for other preprocedural examination: Secondary | ICD-10-CM | POA: Diagnosis present

## 2017-04-14 HISTORY — DX: Unspecified urinary incontinence: R32

## 2017-04-14 HISTORY — DX: Angina pectoris, unspecified: I20.9

## 2017-04-14 LAB — BASIC METABOLIC PANEL
ANION GAP: 9 (ref 5–15)
BUN: 29 mg/dL — ABNORMAL HIGH (ref 6–20)
CHLORIDE: 105 mmol/L (ref 101–111)
CO2: 25 mmol/L (ref 22–32)
Calcium: 8.8 mg/dL — ABNORMAL LOW (ref 8.9–10.3)
Creatinine, Ser: 1.3 mg/dL — ABNORMAL HIGH (ref 0.61–1.24)
GFR calc non Af Amer: 51 mL/min — ABNORMAL LOW (ref >60)
GFR, EST AFRICAN AMERICAN: 59 mL/min — AB (ref >60)
Glucose, Bld: 93 mg/dL (ref 65–99)
Potassium: 4.8 mmol/L (ref 3.5–5.1)
Sodium: 139 mmol/L (ref 135–145)

## 2017-04-14 LAB — CBC WITH DIFFERENTIAL/PLATELET
Basophils Absolute: 0 10*3/uL (ref 0.0–0.1)
Basophils Relative: 1 %
Eosinophils Absolute: 0.4 10*3/uL (ref 0.0–0.7)
Eosinophils Relative: 6 %
HEMATOCRIT: 40.6 % (ref 39.0–52.0)
HEMOGLOBIN: 13.1 g/dL (ref 13.0–17.0)
LYMPHS ABS: 1.7 10*3/uL (ref 0.7–4.0)
Lymphocytes Relative: 31 %
MCH: 31 pg (ref 26.0–34.0)
MCHC: 32.3 g/dL (ref 30.0–36.0)
MCV: 96 fL (ref 78.0–100.0)
MONOS PCT: 11 %
Monocytes Absolute: 0.6 10*3/uL (ref 0.1–1.0)
NEUTROS ABS: 2.8 10*3/uL (ref 1.7–7.7)
NEUTROS PCT: 51 %
Platelets: 184 10*3/uL (ref 150–400)
RBC: 4.23 MIL/uL (ref 4.22–5.81)
RDW: 14.6 % (ref 11.5–15.5)
WBC: 5.5 10*3/uL (ref 4.0–10.5)

## 2017-04-14 LAB — APTT: aPTT: 35 seconds (ref 24–36)

## 2017-04-14 LAB — TYPE AND SCREEN
ABO/RH(D): O POS
Antibody Screen: NEGATIVE

## 2017-04-14 LAB — URINALYSIS, ROUTINE W REFLEX MICROSCOPIC
GLUCOSE, UA: NEGATIVE mg/dL
Hgb urine dipstick: NEGATIVE
KETONES UR: NEGATIVE mg/dL
LEUKOCYTES UA: NEGATIVE
NITRITE: NEGATIVE
PH: 5 (ref 5.0–8.0)
Protein, ur: NEGATIVE mg/dL
Specific Gravity, Urine: 1.024 (ref 1.005–1.030)

## 2017-04-14 LAB — PROTIME-INR
INR: 0.96
Prothrombin Time: 12.6 seconds (ref 11.4–15.2)

## 2017-04-14 LAB — SURGICAL PCR SCREEN
MRSA, PCR: NEGATIVE
Staphylococcus aureus: NEGATIVE

## 2017-04-14 NOTE — Progress Notes (Signed)
   04/14/17 0919  OBSTRUCTIVE SLEEP APNEA  Have you ever been diagnosed with sleep apnea through a sleep study? No  Do you snore loudly (loud enough to be heard through closed doors)?  0  Do you often feel tired, fatigued, or sleepy during the daytime (such as falling asleep during driving or talking to someone)? 0  Has anyone observed you stop breathing during your sleep? 0  Do you have, or are you being treated for high blood pressure? 1  BMI more than 35 kg/m2? 1  Age > 50 (1-yes) 1  Neck circumference greater than:Male 16 inches or larger, Male 17inches or larger? 1  Male Gender (Yes=1) 1  Obstructive Sleep Apnea Score 5  Score 5 or greater  Results sent to PCP

## 2017-04-16 NOTE — Progress Notes (Signed)
Anesthesia Chart Review:  Pt is a 76 year old male scheduled for L total hip arthroplasty anterior approach on 04/22/2017 with Melrose Nakayama, M.D.  - Cardiologist is Peter Martinique, MD, last office visit 01/13/17 follow up in 1 year recommended.  - PCP is Cari Caraway, MD  PMH includes:  Atrial fibrillation (none since 1962), paroxysmal atrial flutter, HTN, CKD (stage 3), ulcerative proctitis, post-op N/V, GERD.  Current smoker. BMI 36. S/p lumbar laminectomy 08/15/16. S/p lumbar interbody fusion 04/18/16  Medications include: ASA, carvedilol, crestor, zetia, iron, lisinopril, prilosec, rosuvastatin  BP 95/82   Pulse 78   Temp 36.9 C (Oral)   Resp 18   Ht 5' 9.5" (1.765 m)   Wt 246 lb 6 oz (111.8 kg)   SpO2 98%   BMI 35.86 kg/m    Preoperative labs reviewed.    EKG 01/13/17: Sinus bradycardia (58 bpm).   Cardiac cath 03/03/08: Normal coronaries  If no changes, I anticipate pt can proceed with surgery as scheduled.   Willeen Cass, FNP-BC [facility]Anesthesiology Phone: [phone] 04/16/2017 3:18 PM

## 2017-04-21 MED ORDER — TRANEXAMIC ACID 1000 MG/10ML IV SOLN
1000.0000 mg | INTRAVENOUS | Status: AC
  Administered 2017-04-22: 1000 mg via INTRAVENOUS
  Filled 2017-04-21: qty 10

## 2017-04-21 MED ORDER — VANCOMYCIN HCL 10 G IV SOLR
1500.0000 mg | INTRAVENOUS | Status: AC
  Administered 2017-04-22: 500 mg via INTRAVENOUS
  Filled 2017-04-21: qty 1500

## 2017-04-21 MED ORDER — TRANEXAMIC ACID 1000 MG/10ML IV SOLN
2000.0000 mg | INTRAVENOUS | Status: AC
  Administered 2017-04-22: 2000 mg via TOPICAL
  Filled 2017-04-21: qty 20

## 2017-04-21 NOTE — H&P (Signed)
TOTAL HIP ADMISSION H&P  Patient is admitted for left total hip arthroplasty.  Subjective:  Chief Complaint: left hip pain  HPI: [name], 76 y.o. male, has a history of pain and functional disability in the left hip(s) due to arthritis and patient has failed non-surgical conservative treatments for greater than 12 weeks to include NSAID's and/or analgesics, corticosteriod injections, flexibility and strengthening excercises, use of assistive devices, weight reduction as appropriate and activity modification.  Onset of symptoms was gradual starting 5 years ago with gradually worsening course since that time.The patient noted no past surgery on the left hip(s).  Patient currently rates pain in the left hip at 10 out of 10 with activity. Patient has night pain, worsening of pain with activity and weight bearing, trendelenberg gait, pain that interfers with activities of daily living and crepitus. Patient has evidence of subchondral cysts, subchondral sclerosis, periarticular osteophytes and joint space narrowing by imaging studies. This condition presents safety issues increasing the risk of falls. There is no current active infection.  Patient Active Problem List   Diagnosis Date Noted  . 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  . Spinal stenosis, lumbar region, with neurogenic claudication 04/18/2016  . Essential hypertension   . Restless leg syndrome, uncontrolled 08/31/2014  . Restless leg syndrome 04/19/2014  . Leg pain, bilateral   . Normal coronary arteries 02/01/2014  . CKD (chronic kidney disease), stage III ([place]) 07/05/2013  . Dysphagia 07/05/2013  . Esophageal reflux 07/05/2013  . Hearing difficulty 07/05/2013  . Hypertension 07/05/2013  . Impaired fasting glucose 07/05/2013  . Insomnia 07/05/2013  . Spinal stenosis 07/05/2013  . Major depressive disorder, recurrent episode ([place]) 07/05/2013  . Osteoarthrosis, unspecified whether generalized or localized,  unspecified site 07/05/2013  . Tachyarrhythmia 07/05/2013  . 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) 07/05/2013  . Mixed hyperlipidemia 05/24/2013   Past Medical History:  Diagnosis Date  . Anginal pain (HCC)    NONE IN 3 YEARS  . Arthritis   . Atrial fibrillation ([place]) 1962   a. No recurrence since 1962.  . Chest pain    a. 1998 Cath: nl cors;  b. 2009 Cath: nl cors.  . Chronic kidney disease    stage 3  . Depression   . Dysrhythmia    NO TROUBLE IN 1 YR    DR. PETER [place]   . Esophageal reflux   . Essential hypertension   . History of Paroxysmal atrial flutter ([place])   . Hyperlipidemia   . Incontinence of urine   . Leg pain, bilateral   . Nocturia   . PONV (postoperative nausea and vomiting)    CONSTIPATED  . Scarlet fever 1945  . Spinal stenosis   . Staph skin infection    LEFT GROIN  04/11/16  TX W/ DOXYCYCLINE  . Ulcerative proctitis [facility])     Past Surgical History:  Procedure Laterality Date  . ANKLE RECONSTRUCTION Right 2007   [place]  . CARDIAC CATHETERIZATION  03/03/2008   Left heart cardiac catheterization and coronary  . CARDIAC CATHETERIZATION  1997   Dr Wynonia Lawman  . CATARACT EXTRACTION W/ INTRAOCULAR LENS  IMPLANT, BILATERAL     2016  . ESOPHAGEAL DILATION     2008  . INNER EAR SURGERY     EAR INJECTION FOR MENIERES  . KNEE ARTHROSCOPY  1991, 1995  . Left Left three- four Redo laminectomy Left 08/15/2016   Performed by Erline Levine, MD at [place]  12/2010  . LUMBAR  THREE-FOUR, LUMBAR FOUR-FIVE PERCUTANEOUS PEDICLE SCREW N/A 04/18/2016   Performed by Erline Levine, MD at [place], LUMBAR FOUR-FIVE ANTEROLATERAL LUMBAR INTERBODY FUSION Right 04/18/2016   Performed by Erline Levine, MD at [facility] OR    No current facility-administered medications for this encounter.    Current Outpatient Medications  Medication Sig Dispense Refill Last Dose  . acetaminophen (TYLENOL) 325 MG tablet Take 650 mg by mouth every 6 (six)  hours as needed.   Taking  . acetaminophen (TYLENOL) 500 MG tablet Take 500 mg every 6 (six) hours as needed by mouth for mild pain.     Marland Kitchen aspirin EC 81 MG tablet Take 81 mg by mouth daily.   Taking  . carvedilol (COREG) 3.125 MG tablet Take 3.125 mg by mouth 2 (two) times daily with a meal.    Taking  . clonazePAM (KLONOPIN) 0.5 MG tablet TAKE 1/2 TO 1 TABLET AT BEDTIME AS NEEDED. (Patient taking differently: Take 0.5 mg at bedtime by mouth. ) 30 tablet 5   . Coenzyme Q10 (COQ10 PO) Take 15 mLs by mouth daily.   Taking  . DULoxetine (CYMBALTA) 60 MG capsule Take 60 mg daily by mouth.    Taking  . ezetimibe (ZETIA) 10 MG tablet Take 1 tablet (10 mg total) by mouth daily. 30 tablet 6 Taking  . lisinopril (PRINIVIL,ZESTRIL) 10 MG tablet Take 10 mg daily by mouth.     Marland Kitchen LYRICA 100 MG capsule TAKE (1) CAPSULE TWICE DAILY. (Patient taking differently: TAKE (2) CAPSULES AT BEDTIME.) 180 capsule 1   . Melatonin 10 MG CAPS Take 10 mg at bedtime by mouth.     . meloxicam (MOBIC) 15 MG tablet Take 15 mg by mouth daily.   Taking  . methocarbamol (ROBAXIN) 500 MG tablet Take 1 tablet (500 mg total) by mouth every 6 (six) hours as needed for muscle spasms. (Patient taking differently: Take 500 mg 3 (three) times daily by mouth. ) 60 tablet 1 Taking  . nitroGLYCERIN (NITROSTAT) 0.4 MG SL tablet Place 0.4 mg under the tongue every 5 (five) minutes as needed for chest pain.   Taking  . omeprazole (PRILOSEC OTC) 20 MG tablet Take 20 mg by mouth daily.   Taking  . oxyCODONE-acetaminophen (PERCOCET/ROXICET) 5-325 MG tablet Take 1-2 tablets by mouth every 4 (four) hours as needed for moderate pain. (Patient taking differently: Take 1 tablet every 4 (four) hours as needed by mouth for moderate pain. ) 80 tablet 0 Taking  . polyethylene glycol (MIRALAX / GLYCOLAX) packet Take 17 g by mouth daily as needed for mild constipation.   Taking  . rosuvastatin (CRESTOR) 10 MG tablet TAKE 1 TABLET ONCE DAILY OR AS DIRECTED.  (Patient taking differently: TAKE 1 TABLET ONCE DAILY.) 90 tablet 0 Taking  . sennosides-docusate sodium (SENOKOT-S) 8.6-50 MG tablet Take 2 tablets by mouth at bedtime as needed for constipation.    Taking  . vitamin C (ASCORBIC ACID) 500 MG tablet Take 500 mg by mouth daily.   Taking  . lisinopril (PRINIVIL,ZESTRIL) 2.5 MG tablet Take 2 tablets (5 mg total) by mouth daily. (Patient not taking: Reported on 04/08/2017) 90 tablet 3 Not Taking at Unknown time   Allergies  Allergen Reactions  . Compazine [Prochlorperazine Edisylate] Other (See Comments)    EXTRAPYRAMIDAL MOVEMENT [Involuntary muscle movement]  . Welchol [Colesevelam Hcl] Other (See Comments)    MYALGIAS [Leg aches]  . Gemfibrozil Nausea Only    PATIENT TOLERATES  . Penicillins  Nausea And Vomiting and Rash     Has patient had a PCN reaction causing immediate rash, facial/tongue/throat swelling, SOB or lightheadedness with hypotension: #  #  #  YES  #  #  #  Has patient had a PCN reaction causing severe rash involving mucus membranes or skin necrosis: No Has patient had a PCN reaction that required hospitalization No Has patient had a PCN reaction occurring within the last 10 years: No If all of the above answers are "NO", then may proceed with Cephalosporin use.   . Red Yeast Rice [Cholestin] Other (See Comments)    flushing    Social History   Tobacco Use  . Smoking status: Current Some Day Smoker    Types: Pipe  . Smokeless tobacco: Never Used  Substance Use Topics  . Alcohol use: Yes    Alcohol/week: 0.6 oz    Types: 1 Shots of liquor per week    Comment: 1-3 weekly    Family History  Problem Relation Age of Onset  . Prostate cancer Father   . Heart attack Brother   . Kidney cancer Brother   . Brain cancer Brother      Review of Systems  Musculoskeletal: Positive for joint pain.       Left hip  All other systems reviewed and are negative.   Objective:  Physical Exam  Constitutional: He is oriented  to person, place, and time. He appears well-developed and well-nourished.  HENT:  Head: Normocephalic and atraumatic.  Eyes: Pupils are equal, round, and reactive to light.  Neck: Normal range of motion.  Cardiovascular: Normal rate and regular rhythm.  Respiratory: Effort normal.  GI: Soft.  Musculoskeletal:  Left hip is painful to rotation.  His leg lengths look equal.  He is walking with a markedly antalgic gait.  Sensation and motor function are intact in his feet with palpable pulses on both sides.   Neurological: He is alert and oriented to person, place, and time.  Skin: Skin is warm and dry.  Psychiatric: He has a normal mood and affect. His behavior is normal. Judgment and thought content normal.    Vital signs in last 24 hours:    Labs:   Estimated body mass index is 35.86 kg/m as calculated from the following:   Height as of 04/14/17: 5' 9.5" (1.765 m).   Weight as of 04/14/17: 111.8 kg (246 lb 6 oz).   Imaging Review Plain radiographs demonstrate severe degenerative joint disease of the left hip(s). The bone quality appears to be good for age and reported activity level.  Assessment/Plan:  End stage primary arthritis, left hip(s)  The patient history, physical examination, clinical judgement of the provider and imaging studies are consistent with end stage degenerative joint disease of the left hip(s) and total hip arthroplasty is deemed medically necessary. The treatment options including medical management, injection therapy, arthroscopy and arthroplasty were discussed at length. The risks and benefits of total hip arthroplasty were presented and reviewed. The risks due to aseptic loosening, infection, stiffness, dislocation/subluxation,  thromboembolic complications and other imponderables were discussed.  The patient acknowledged the explanation, agreed to proceed with the plan and consent was signed. Patient is being admitted for inpatient treatment for surgery,  pain control, PT, OT, prophylactic antibiotics, VTE prophylaxis, progressive ambulation and ADL's and discharge planning.The patient is planning to be discharged home with home health services

## 2017-04-22 ENCOUNTER — Inpatient Hospital Stay (HOSPITAL_COMMUNITY)
Admission: RE | Admit: 2017-04-22 | Discharge: 2017-04-24 | DRG: 470 | Disposition: A | Discharge status: Home Health | Payer: Medicare Other | Attending: Orthopaedic Surgery | Admitting: Orthopaedic Surgery

## 2017-04-22 ENCOUNTER — Other Ambulatory Visit: Payer: Self-pay | Admitting: Cardiology

## 2017-04-22 ENCOUNTER — Inpatient Hospital Stay (HOSPITAL_COMMUNITY): Payer: Medicare Other

## 2017-04-22 ENCOUNTER — Encounter (HOSPITAL_COMMUNITY): Payer: Self-pay

## 2017-04-22 ENCOUNTER — Other Ambulatory Visit: Payer: Self-pay

## 2017-04-22 ENCOUNTER — Encounter (HOSPITAL_COMMUNITY)
Admission: RE | Disposition: A | Discharge status: Home Health | Payer: Self-pay | Source: Home / Self Care | Attending: Orthopaedic Surgery

## 2017-04-22 ENCOUNTER — Inpatient Hospital Stay (HOSPITAL_COMMUNITY): Payer: Medicare Other | Admitting: Emergency Medicine

## 2017-04-22 DIAGNOSIS — Z91018 Allergy to other foods: Secondary | ICD-10-CM | POA: Diagnosis not present

## 2017-04-22 DIAGNOSIS — M25552 Pain in left hip: Secondary | ICD-10-CM | POA: Diagnosis present

## 2017-04-22 DIAGNOSIS — Z880 Allergy status to penicillin: Secondary | ICD-10-CM

## 2017-04-22 DIAGNOSIS — K5120 Ulcerative (chronic) proctitis without complications: Secondary | ICD-10-CM | POA: Diagnosis present

## 2017-04-22 DIAGNOSIS — N183 Chronic kidney disease, stage 3 (moderate): Secondary | ICD-10-CM | POA: Diagnosis present

## 2017-04-22 DIAGNOSIS — G4700 Insomnia, unspecified: Secondary | ICD-10-CM | POA: Diagnosis present

## 2017-04-22 DIAGNOSIS — I129 Hypertensive chronic kidney disease with stage 1 through stage 4 chronic kidney disease, or unspecified chronic kidney disease: Secondary | ICD-10-CM | POA: Diagnosis present

## 2017-04-22 DIAGNOSIS — F329 Major depressive disorder, single episode, unspecified: Secondary | ICD-10-CM | POA: Diagnosis present

## 2017-04-22 DIAGNOSIS — F17290 Nicotine dependence, other tobacco product, uncomplicated: Secondary | ICD-10-CM | POA: Diagnosis present

## 2017-04-22 DIAGNOSIS — G2581 Restless legs syndrome: Secondary | ICD-10-CM | POA: Diagnosis present

## 2017-04-22 DIAGNOSIS — Z961 Presence of intraocular lens: Secondary | ICD-10-CM | POA: Diagnosis present

## 2017-04-22 DIAGNOSIS — Z9842 Cataract extraction status, left eye: Secondary | ICD-10-CM

## 2017-04-22 DIAGNOSIS — Z888 Allergy status to other drugs, medicaments and biological substances status: Secondary | ICD-10-CM

## 2017-04-22 DIAGNOSIS — Z9841 Cataract extraction status, right eye: Secondary | ICD-10-CM | POA: Diagnosis not present

## 2017-04-22 DIAGNOSIS — I4891 Unspecified atrial fibrillation: Secondary | ICD-10-CM | POA: Diagnosis present

## 2017-04-22 DIAGNOSIS — Z79899 Other long term (current) drug therapy: Secondary | ICD-10-CM

## 2017-04-22 DIAGNOSIS — K219 Gastro-esophageal reflux disease without esophagitis: Secondary | ICD-10-CM | POA: Diagnosis present

## 2017-04-22 DIAGNOSIS — R05 Cough: Secondary | ICD-10-CM | POA: Diagnosis present

## 2017-04-22 DIAGNOSIS — Z8249 Family history of ischemic heart disease and other diseases of the circulatory system: Secondary | ICD-10-CM | POA: Diagnosis not present

## 2017-04-22 DIAGNOSIS — Z419 Encounter for procedure for purposes other than remedying health state, unspecified: Secondary | ICD-10-CM

## 2017-04-22 DIAGNOSIS — M1612 Unilateral primary osteoarthritis, left hip: Secondary | ICD-10-CM | POA: Diagnosis present

## 2017-04-22 DIAGNOSIS — Z7982 Long term (current) use of aspirin: Secondary | ICD-10-CM

## 2017-04-22 DIAGNOSIS — E782 Mixed hyperlipidemia: Secondary | ICD-10-CM | POA: Diagnosis present

## 2017-04-22 DIAGNOSIS — Z791 Long term (current) use of non-steroidal anti-inflammatories (NSAID): Secondary | ICD-10-CM

## 2017-04-22 HISTORY — PX: TOTAL HIP ARTHROPLASTY: SHX124

## 2017-04-22 SURGERY — ARTHROPLASTY, HIP, TOTAL, ANTERIOR APPROACH
Anesthesia: Monitor Anesthesia Care | Laterality: Left

## 2017-04-22 MED ORDER — 0.9 % SODIUM CHLORIDE (POUR BTL) OPTIME
TOPICAL | Status: DC | PRN
  Administered 2017-04-22: 1000 mL

## 2017-04-22 MED ORDER — SENNOSIDES-DOCUSATE SODIUM 8.6-50 MG PO TABS
2.0000 | ORAL_TABLET | Freq: Every evening | ORAL | Status: DC | PRN
  Administered 2017-04-23: 2 via ORAL
  Filled 2017-04-22: qty 2

## 2017-04-22 MED ORDER — OXYCODONE HCL 5 MG PO TABS
10.0000 mg | ORAL_TABLET | ORAL | Status: DC | PRN
  Administered 2017-04-22: 10 mg via ORAL
  Administered 2017-04-22: 5 mg via ORAL
  Administered 2017-04-22 – 2017-04-23 (×5): 10 mg via ORAL
  Filled 2017-04-22 (×7): qty 2

## 2017-04-22 MED ORDER — PHENYLEPHRINE HCL 10 MG/ML IJ SOLN
INTRAVENOUS | Status: DC | PRN
  Administered 2017-04-22: 40 ug/min via INTRAVENOUS

## 2017-04-22 MED ORDER — HYDROMORPHONE HCL 1 MG/ML IJ SOLN
0.5000 mg | INTRAMUSCULAR | Status: DC | PRN
Start: 2017-04-22 — End: 2017-04-24
  Administered 2017-04-24: 1 mg via INTRAVENOUS
  Filled 2017-04-22: qty 1

## 2017-04-22 MED ORDER — DOCUSATE SODIUM 100 MG PO CAPS
100.0000 mg | ORAL_CAPSULE | Freq: Two times a day (BID) | ORAL | Status: DC
  Administered 2017-04-22 – 2017-04-24 (×4): 100 mg via ORAL
  Filled 2017-04-22 (×4): qty 1

## 2017-04-22 MED ORDER — CEFAZOLIN SODIUM-DEXTROSE 2-4 GM/100ML-% IV SOLN
2.0000 g | Freq: Four times a day (QID) | INTRAVENOUS | Status: AC
  Administered 2017-04-22 (×2): 2 g via INTRAVENOUS
  Filled 2017-04-22 (×2): qty 100

## 2017-04-22 MED ORDER — DEXMEDETOMIDINE HCL IN NACL 200 MCG/50ML IV SOLN
INTRAVENOUS | Status: DC | PRN
  Administered 2017-04-22: 12 ug via INTRAVENOUS

## 2017-04-22 MED ORDER — ACETAMINOPHEN 325 MG PO TABS
650.0000 mg | ORAL_TABLET | ORAL | Status: DC | PRN
  Administered 2017-04-22 – 2017-04-23 (×5): 650 mg via ORAL
  Filled 2017-04-22 (×7): qty 2

## 2017-04-22 MED ORDER — PHENYLEPHRINE HCL 10 MG/ML IJ SOLN
INTRAMUSCULAR | Status: DC | PRN
  Administered 2017-04-22: 80 ug via INTRAVENOUS

## 2017-04-22 MED ORDER — CHLORHEXIDINE GLUCONATE 4 % EX LIQD: 60.0000 mL | Freq: Once | CUTANEOUS | Status: DC

## 2017-04-22 MED ORDER — DEXAMETHASONE SODIUM PHOSPHATE 10 MG/ML IJ SOLN
INTRAMUSCULAR | Status: DC | PRN
  Administered 2017-04-22: 5 mg via INTRAVENOUS

## 2017-04-22 MED ORDER — ASPIRIN EC 325 MG PO TBEC
325.0000 mg | DELAYED_RELEASE_TABLET | Freq: Two times a day (BID) | ORAL | Status: DC
Start: 2017-04-23 — End: 2017-04-24
  Administered 2017-04-23 – 2017-04-24 (×3): 325 mg via ORAL
  Filled 2017-04-22 (×3): qty 1

## 2017-04-22 MED ORDER — BUPIVACAINE LIPOSOME 1.3 % IJ SUSP
20.0000 mL | INTRAMUSCULAR | Status: DC
  Filled 2017-04-22: qty 20

## 2017-04-22 MED ORDER — METHOCARBAMOL 500 MG PO TABS
500.0000 mg | ORAL_TABLET | Freq: Four times a day (QID) | ORAL | Status: DC | PRN
  Administered 2017-04-22 – 2017-04-23 (×4): 500 mg via ORAL
  Filled 2017-04-22 (×4): qty 1

## 2017-04-22 MED ORDER — MENTHOL 3 MG MT LOZG: 1.0000 | LOZENGE | OROMUCOSAL | Status: DC | PRN

## 2017-04-22 MED ORDER — PROPOFOL 10 MG/ML IV BOLUS
INTRAVENOUS | Status: DC | PRN
  Administered 2017-04-22: 20 mg via INTRAVENOUS
  Administered 2017-04-22: 30 mg via INTRAVENOUS

## 2017-04-22 MED ORDER — PROPOFOL 500 MG/50ML IV EMUL: INTRAVENOUS | Status: DC | PRN

## 2017-04-22 MED ORDER — PROPOFOL 500 MG/50ML IV EMUL
INTRAVENOUS | Status: DC | PRN
  Administered 2017-04-22: 50 ug/kg/min via INTRAVENOUS

## 2017-04-22 MED ORDER — EPHEDRINE SULFATE-NACL 50-0.9 MG/10ML-% IV SOSY
PREFILLED_SYRINGE | INTRAVENOUS | Status: DC | PRN
  Administered 2017-04-22 (×2): 5 mg via INTRAVENOUS
  Administered 2017-04-22: 10 mg via INTRAVENOUS
  Administered 2017-04-22: 5 mg via INTRAVENOUS
  Administered 2017-04-22: 15 mg via INTRAVENOUS

## 2017-04-22 MED ORDER — PANTOPRAZOLE SODIUM 40 MG PO TBEC
40.0000 mg | DELAYED_RELEASE_TABLET | Freq: Every day | ORAL | Status: DC
  Administered 2017-04-23 – 2017-04-24 (×2): 40 mg via ORAL
  Filled 2017-04-22 (×2): qty 1

## 2017-04-22 MED ORDER — MIDAZOLAM HCL 2 MG/2ML IJ SOLN
INTRAMUSCULAR | Status: AC
  Filled 2017-04-22: qty 2

## 2017-04-22 MED ORDER — ALUM & MAG HYDROXIDE-SIMETH 200-200-20 MG/5ML PO SUSP
30.0000 mL | ORAL | Status: DC | PRN
  Administered 2017-04-23: 30 mL via ORAL
  Filled 2017-04-22: qty 30

## 2017-04-22 MED ORDER — CARVEDILOL 3.125 MG PO TABS
3.1250 mg | ORAL_TABLET | Freq: Two times a day (BID) | ORAL | Status: DC
  Administered 2017-04-22 – 2017-04-24 (×4): 3.125 mg via ORAL
  Filled 2017-04-22 (×4): qty 1

## 2017-04-22 MED ORDER — MELATONIN 3 MG PO TABS
9.0000 mg | ORAL_TABLET | Freq: Every day | ORAL | Status: DC
  Administered 2017-04-22 – 2017-04-23 (×2): 9 mg via ORAL
  Filled 2017-04-22 (×2): qty 3

## 2017-04-22 MED ORDER — DIPHENHYDRAMINE HCL 12.5 MG/5ML PO ELIX: 12.5000 mg | ORAL_SOLUTION | ORAL | Status: DC | PRN

## 2017-04-22 MED ORDER — ACETAMINOPHEN 650 MG RE SUPP: 650.0000 mg | RECTAL | Status: DC | PRN

## 2017-04-22 MED ORDER — HYDROMORPHONE HCL 1 MG/ML IJ SOLN: 0.2500 mg | INTRAMUSCULAR | Status: DC | PRN

## 2017-04-22 MED ORDER — ONDANSETRON HCL 4 MG/2ML IJ SOLN
INTRAMUSCULAR | Status: DC | PRN
  Administered 2017-04-22: 4 mg via INTRAVENOUS

## 2017-04-22 MED ORDER — PHENOL 1.4 % MT LIQD: 1.0000 | OROMUCOSAL | Status: DC | PRN

## 2017-04-22 MED ORDER — TRANEXAMIC ACID 1000 MG/10ML IV SOLN
1000.0000 mg | Freq: Once | INTRAVENOUS | Status: AC
  Administered 2017-04-22: 1000 mg via INTRAVENOUS
  Filled 2017-04-22: qty 1100

## 2017-04-22 MED ORDER — DEXMEDETOMIDINE HCL IN NACL 200 MCG/50ML IV SOLN
INTRAVENOUS | Status: AC
  Filled 2017-04-22: qty 50

## 2017-04-22 MED ORDER — LACTATED RINGERS IV SOLN
INTRAVENOUS | Status: DC
  Administered 2017-04-22 (×2): via INTRAVENOUS

## 2017-04-22 MED ORDER — PROPOFOL 10 MG/ML IV BOLUS
INTRAVENOUS | Status: AC
  Filled 2017-04-22: qty 20

## 2017-04-22 MED ORDER — OMEPRAZOLE MAGNESIUM 20 MG PO TBEC: 20.0000 mg | DELAYED_RELEASE_TABLET | Freq: Every day | ORAL | Status: DC

## 2017-04-22 MED ORDER — LISINOPRIL 10 MG PO TABS
10.0000 mg | ORAL_TABLET | Freq: Every day | ORAL | Status: DC
  Administered 2017-04-22 – 2017-04-24 (×3): 10 mg via ORAL
  Filled 2017-04-22 (×3): qty 1

## 2017-04-22 MED ORDER — ONDANSETRON HCL 4 MG/2ML IJ SOLN
INTRAMUSCULAR | Status: AC
  Filled 2017-04-22: qty 2

## 2017-04-22 MED ORDER — CEFAZOLIN SODIUM 1 G IJ SOLR
INTRAMUSCULAR | Status: AC
  Filled 2017-04-22: qty 20

## 2017-04-22 MED ORDER — ROSUVASTATIN CALCIUM 10 MG PO TABS
10.0000 mg | ORAL_TABLET | Freq: Every evening | ORAL | Status: DC
  Administered 2017-04-22 – 2017-04-23 (×2): 10 mg via ORAL
  Filled 2017-04-22 (×2): qty 1

## 2017-04-22 MED ORDER — METHOCARBAMOL 1000 MG/10ML IJ SOLN
500.0000 mg | Freq: Four times a day (QID) | INTRAVENOUS | Status: DC | PRN
  Filled 2017-04-22: qty 5

## 2017-04-22 MED ORDER — EZETIMIBE 10 MG PO TABS
10.0000 mg | ORAL_TABLET | Freq: Every day | ORAL | Status: DC
  Administered 2017-04-22 – 2017-04-24 (×3): 10 mg via ORAL
  Filled 2017-04-22 (×3): qty 1

## 2017-04-22 MED ORDER — CEFAZOLIN SODIUM-DEXTROSE 1-4 GM/50ML-% IV SOLN
INTRAVENOUS | Status: DC | PRN
  Administered 2017-04-22: 2 g via INTRAVENOUS

## 2017-04-22 MED ORDER — NITROGLYCERIN 0.4 MG SL SUBL: 0.4000 mg | SUBLINGUAL_TABLET | SUBLINGUAL | Status: DC | PRN

## 2017-04-22 MED ORDER — FENTANYL CITRATE (PF) 250 MCG/5ML IJ SOLN
INTRAMUSCULAR | Status: AC
  Filled 2017-04-22: qty 5

## 2017-04-22 MED ORDER — BUPIVACAINE LIPOSOME 1.3 % IJ SUSP
INTRAMUSCULAR | Status: DC | PRN
  Administered 2017-04-22: 20 mL

## 2017-04-22 MED ORDER — BUPIVACAINE-EPINEPHRINE (PF) 0.25% -1:200000 IJ SOLN
INTRAMUSCULAR | Status: DC | PRN
  Administered 2017-04-22: 20 mL

## 2017-04-22 MED ORDER — ONDANSETRON HCL 4 MG/2ML IJ SOLN: 4.0000 mg | Freq: Four times a day (QID) | INTRAMUSCULAR | Status: DC | PRN

## 2017-04-22 MED ORDER — LACTATED RINGERS IV SOLN
INTRAVENOUS | Status: DC
  Administered 2017-04-22 (×2): via INTRAVENOUS

## 2017-04-22 MED ORDER — BUPIVACAINE-EPINEPHRINE (PF) 0.25% -1:200000 IJ SOLN
INTRAMUSCULAR | Status: AC
  Filled 2017-04-22: qty 30

## 2017-04-22 MED ORDER — PREGABALIN 100 MG PO CAPS
200.0000 mg | ORAL_CAPSULE | Freq: Every day | ORAL | Status: DC
  Administered 2017-04-22 – 2017-04-23 (×2): 200 mg via ORAL
  Filled 2017-04-22 (×2): qty 2

## 2017-04-22 MED ORDER — BISACODYL 5 MG PO TBEC: 5.0000 mg | DELAYED_RELEASE_TABLET | Freq: Every day | ORAL | Status: DC | PRN

## 2017-04-22 MED ORDER — POLYETHYLENE GLYCOL 3350 17 G PO PACK: 17.0000 g | PACK | Freq: Every day | ORAL | Status: DC | PRN

## 2017-04-22 MED ORDER — METOCLOPRAMIDE HCL 5 MG/ML IJ SOLN: 5.0000 mg | Freq: Three times a day (TID) | INTRAMUSCULAR | Status: DC | PRN

## 2017-04-22 MED ORDER — METOCLOPRAMIDE HCL 5 MG PO TABS: 5.0000 mg | ORAL_TABLET | Freq: Three times a day (TID) | ORAL | Status: DC | PRN

## 2017-04-22 MED ORDER — DEXAMETHASONE SODIUM PHOSPHATE 10 MG/ML IJ SOLN
INTRAMUSCULAR | Status: AC
  Filled 2017-04-22: qty 1

## 2017-04-22 MED ORDER — OXYCODONE HCL 5 MG PO TABS
5.0000 mg | ORAL_TABLET | ORAL | Status: DC | PRN
  Administered 2017-04-22: 5 mg via ORAL
  Filled 2017-04-22 (×5): qty 1

## 2017-04-22 MED ORDER — DULOXETINE HCL 60 MG PO CPEP
60.0000 mg | ORAL_CAPSULE | Freq: Every day | ORAL | Status: DC
  Administered 2017-04-23 – 2017-04-24 (×2): 60 mg via ORAL
  Filled 2017-04-22 (×2): qty 1

## 2017-04-22 MED ORDER — CLONAZEPAM 0.5 MG PO TABS
0.5000 mg | ORAL_TABLET | Freq: Every day | ORAL | Status: DC
  Administered 2017-04-22 – 2017-04-23 (×2): 0.5 mg via ORAL
  Filled 2017-04-22 (×2): qty 1

## 2017-04-22 MED ORDER — MELATONIN 10 MG PO CAPS: 10.0000 mg | ORAL_CAPSULE | Freq: Every day | ORAL | Status: DC

## 2017-04-22 MED ORDER — ONDANSETRON HCL 4 MG PO TABS: 4.0000 mg | ORAL_TABLET | Freq: Four times a day (QID) | ORAL | Status: DC | PRN

## 2017-04-22 SURGICAL SUPPLY — 42 items
BAG DECANTER FOR FLEXI CONT (MISCELLANEOUS) ×2 IMPLANT
BLADE SAW SGTL 18X1.27X75 (BLADE) ×2 IMPLANT
CAPT HIP TOTAL 2 ×2 IMPLANT
CELLS DAT CNTRL 66122 CELL SVR (MISCELLANEOUS) ×1 IMPLANT
COVER PERINEAL POST (MISCELLANEOUS) ×2 IMPLANT
COVER SURGICAL LIGHT HANDLE (MISCELLANEOUS) ×2 IMPLANT
DRAPE C-ARM 42X72 X-RAY (DRAPES) ×2 IMPLANT
DRAPE STERI IOBAN 125X83 (DRAPES) ×2 IMPLANT
DRAPE U-SHAPE 47X51 STRL (DRAPES) ×6 IMPLANT
DRSG AQUACEL AG ADV 3.5X10 (GAUZE/BANDAGES/DRESSINGS) ×2 IMPLANT
DURAPREP 26ML APPLICATOR (WOUND CARE) ×2 IMPLANT
ELECT REM PT RETURN 9FT ADLT (ELECTROSURGICAL) ×2
ELECTRODE REM PT RTRN 9FT ADLT (ELECTROSURGICAL) ×1 IMPLANT
FACESHIELD WRAPAROUND (MASK) ×4 IMPLANT
GLOVE BIO SURGEON STRL SZ8 (GLOVE) ×4 IMPLANT
GLOVE BIOGEL PI IND STRL 8 (GLOVE) ×2 IMPLANT
GLOVE BIOGEL PI INDICATOR 8 (GLOVE) ×2
GOWN STRL REUS W/ TWL LRG LVL3 (GOWN DISPOSABLE) ×1 IMPLANT
GOWN STRL REUS W/ TWL XL LVL3 (GOWN DISPOSABLE) ×2 IMPLANT
GOWN STRL REUS W/TWL LRG LVL3 (GOWN DISPOSABLE) ×2
GOWN STRL REUS W/TWL XL LVL3 (GOWN DISPOSABLE) ×4
KIT BASIN OR (CUSTOM PROCEDURE TRAY) ×2 IMPLANT
KIT ROOM TURNOVER OR (KITS) ×2 IMPLANT
MANIFOLD NEPTUNE II (INSTRUMENTS) ×2 IMPLANT
NEEDLE ECHOTIP HI DEF 22GA (NEEDLE) IMPLANT
NEEDLE HYPO 22GX1.5 SAFETY (NEEDLE) ×2 IMPLANT
NS IRRIG 1000ML POUR BTL (IV SOLUTION) ×2 IMPLANT
PACK TOTAL JOINT (CUSTOM PROCEDURE TRAY) ×2 IMPLANT
PAD ARMBOARD 7.5X6 YLW CONV (MISCELLANEOUS) ×2 IMPLANT
RTRCTR WOUND ALEXIS 18CM MED (MISCELLANEOUS) ×2
STRIP CLOSURE SKIN 1/2X4 (GAUZE/BANDAGES/DRESSINGS) ×2 IMPLANT
SUT ETHIBOND NAB CT1 #1 30IN (SUTURE) ×4 IMPLANT
SUT VIC AB 1 CT1 27 (SUTURE) ×2
SUT VIC AB 1 CT1 27XBRD ANBCTR (SUTURE) ×1 IMPLANT
SUT VIC AB 2-0 CT1 27 (SUTURE) ×2
SUT VIC AB 2-0 CT1 TAPERPNT 27 (SUTURE) ×1 IMPLANT
SUT VIC AB 3-0 PS2 18 (SUTURE) ×2
SUT VIC AB 3-0 PS2 18XBRD (SUTURE) ×1 IMPLANT
SUT VLOC 180 0 24IN GS25 (SUTURE) ×2 IMPLANT
SYR 50ML LL SCALE MARK (SYRINGE) ×2 IMPLANT
SYR BULB IRRIGATION 50ML (SYRINGE) ×2 IMPLANT
YANKAUER SUCT BULB TIP NO VENT (SUCTIONS) ×2 IMPLANT

## 2017-04-22 NOTE — Op Note (Signed)
PRE-OP DIAGNOSIS:  LEFT HIP DEGENERATIVE JOINT DISEASE POST-OP DIAGNOSIS: same PROCEDURE:  LEFT TOTAL HIP ARTHROPLASTY ANTERIOR APPROACH ANESTHESIA:  Spinal and MAC SURGEON:  Melrose Nakayama MD ASSISTANT:  Loni Dolly PA-C   INDICATIONS FOR PROCEDURE:  The patient is a 76 y.o. male with a long history of a painful hip.  This has persisted despite multiple conservative measures.  The patient has persisted with pain and dysfunction making rest and activity difficult.  A total hip replacement is offered as surgical treatment.  Informed operative consent was obtained after discussion of possible complications including reaction to anesthesia, infection, neurovascular injury, dislocation, DVT, PE, and death.  The importance of the postoperative rehab program to optimize result was stressed with the patient.  SUMMARY OF FINDINGS AND PROCEDURE:  Under general anesthesia through a anterior approach an the Hana table a left THR was performed.  The patient had severe degenerative change and good bone quality.  We used DePuy components to replace the hip and these were size KLA 16 Corail femur capped with a +5 110m ceramic hip ball.  On the acetabular side we used a size 54 Gription shell with a plus 4 neutral polyethylene liner.  We did use a hole eliminator.  ALoni DollyPA-C assisted throughout and was invaluable to the completion of the case in that he helped position and retract while I performed the procedure.  He also closed simultaneously to help minimize OR time.  I used fluoroscopy throughout the case to check position of components and leg lengths and read all these views myself.  DESCRIPTION OF PROCEDURE:  The patient was taken to the OR suite where general anesthetic was applied.  The patient was then positioned on the Hana table supine.  All bony prominences were appropriately padded.  Prep and drape was then performed in normal sterile fashion.  The patient was given kefrzol preoperative antibiotic  and an appropriate time out was performed.  We then took an anterior approach to the left hip.  Dissection was taken through adipose to the tensor fascia lata fascia.  This structure was incised longitudinally and we dissected in the intermuscular interval just medial to this muscle.  Cobra retractors were placed superior and inferior to the femoral neck superficial to the capsule.  A capsular incision was then made and the retractors were placed along the femoral neck.  Xray was brought in to get a good level for the femoral neck cut which was made with an oscillating saw and osteotome.  The femoral head was removed with a corkscrew.  The acetabulum was exposed and some labral tissues were excised. Reaming was taken to the inside wall of the pelvis and sequentially up to 1 mm smaller than the actual component.  A trial of components was done and then the aforementioned acetabular shell was placed in appropriate tilt and anteversion confirmed by fluoroscopy. The liner was placed along with the hole eliminator and attention was turned to the femur.  The leg was brought down and over into adduction and the elevator bar was used to raise the femur up gently in the wound.  The piriformis was released with care taken to preserve the obturator internus attachment and all of the posterior capsule. The femur was reamed and then broached to the appropriate size.  A trial reduction was done and the aforementioned head and neck assembly gave uKoreathe best stability in extension with external rotation.  Leg lengths were felt to be about equal by fluoroscopic exam.  The trial components were removed and the wound irrigated.  We then placed the femoral component in appropriate anteversion.  The head was applied to a dry stem neck and the hip again reduced.  It was again stable in the aforementioned position.  The would was irrigated again followed by re-approximation of anterior capsule with ethibond suture. Tensor fascia was  repaired with V-loc suture  followed by deep closure with #O and #2 undyed vicryl.  Skin was closed with subQ stitch and steristrips followed by a sterile dressing.  EBL and IOF can be obtained from anesthesia records.  DISPOSITION:  The patient was extubated in the OR and taken to PACU in stable condition to be admitted to the Orthopedic Surgery for appropriate post-op care to include perioperative antibiotics and DVT prophylaxis.

## 2017-04-22 NOTE — Anesthesia Postprocedure Evaluation (Signed)
Anesthesia Post Note  Patient: [name]  Procedure(s) Performed: TOTAL HIP ARTHROPLASTY ANTERIOR APPROACH (Left )     Patient location during evaluation: PACU Anesthesia Type: MAC and Spinal Level of consciousness: awake and alert Pain management: pain level controlled Vital Signs Assessment: post-procedure vital signs reviewed and stable Respiratory status: spontaneous breathing and respiratory function stable Cardiovascular status: blood pressure returned to baseline and stable Postop Assessment: spinal receding Anesthetic complications: no    Last Vitals:  Vitals:   04/22/17 1045 04/22/17 1100  BP: 99/63 107/68  Pulse: 62 60  Resp: 15 11  Temp:  (!) 36.4 C  SpO2: 100% 100%    Last Pain:  Vitals:   04/22/17 1100  TempSrc:   PainSc: 0-No pain                 Larson Limones DANIEL

## 2017-04-22 NOTE — Anesthesia Procedure Notes (Signed)
Spinal  Patient location during procedure: OR Start time: 04/22/2017 7:31 AM End time: 04/22/2017 8:41 AM Staffing Anesthesiologist: Duane Boston, MD Performed: anesthesiologist  Preanesthetic Checklist Completed: patient identified, surgical consent, pre-op evaluation, timeout performed, IV checked, risks and benefits discussed and monitors and equipment checked Spinal Block Patient position: sitting Prep: DuraPrep Patient monitoring: cardiac monitor, continuous pulse ox and blood pressure Approach: midline Interspace: L1-2. Injection technique: single-shot Needle Needle type: Pencan  Needle gauge: 24 G Needle length: 9 cm Additional Notes Functioning IV was confirmed and monitors were applied. Sterile prep and drape, including hand hygiene and sterile gloves were used. The patient was positioned and the spine was prepped. The skin was anesthetized with lidocaine.  Free flow of clear CSF was obtained prior to injecting local anesthetic into the CSF.  The spinal needle aspirated freely following injection.  The needle was carefully withdrawn.  The patient tolerated the procedure well.

## 2017-04-22 NOTE — Care Management Note (Signed)
Case Management Note  Patient Details  Name: [name] MRN: [id number] Date of Birth: Nov 10, 1940  Subjective/Objective:             Spoke to patient at the bedside, he states he is agreeable to use [facility] for [facility] services as pre assigned by MD office. Patient declines 3/1 and states he would like a RW.        Action/Plan:  CM will continue to follow for DC planning Expected Discharge Date:                  Expected Discharge Plan:  [place]  In-House Referral:     Discharge planning Services  CM Consult  Post Acute Care Choice:  Home Health, Durable Medical Equipment Choice offered to:  Patient  DME Arranged:  [facility] DME Agency:     HH Arranged:  PT [place]:  [facility] (now [facility])  Status of Service:  In process, will continue to follow  If discussed at Long Length of Stay Meetings, dates discussed:    Additional Comments:  Carles Collet, RN 04/22/2017, 3:26 PM

## 2017-04-22 NOTE — Anesthesia Preprocedure Evaluation (Addendum)
Anesthesia Evaluation  Patient identified by MRN, date of birth, ID band Patient awake    Reviewed: Allergy & Precautions, NPO status , Patient's Chart, lab work & pertinent test results, reviewed documented beta blocker date and time   History of Anesthesia Complications (+) PONV and history of anesthetic complications  Airway Mallampati: II  TM Distance: >3 FB Neck ROM: Full    Dental no notable dental hx. (+) Dental Advisory Given   Pulmonary Current Smoker,    Pulmonary exam normal        Cardiovascular hypertension, On Medications and On Home Beta Blockers Normal cardiovascular exam     Neuro/Psych PSYCHIATRIC DISORDERS Depression    GI/Hepatic Neg liver ROS, GERD  Medicated,  Endo/Other    Renal/GU Renal InsufficiencyRenal disease     Musculoskeletal   Abdominal   Peds  Hematology   Anesthesia Other Findings   Reproductive/Obstetrics                           Anesthesia Physical  Anesthesia Plan  ASA: III  Anesthesia Plan: MAC, Spinal and General ETT   Post-op Pain Management:    Induction:   PONV Risk Score and Plan: 2 and Ondansetron and Dexamethasone  Airway Management Planned: Natural Airway  Additional Equipment:   Intra-op Plan:   Post-operative Plan:   Informed Consent: I have reviewed the patients History and Physical, chart, labs and discussed the procedure including the risks, benefits and alternatives for the proposed anesthesia with the patient or authorized representative who has indicated his/her understanding and acceptance.   Dental advisory given  Plan Discussed with: CRNA and Anesthesiologist  Anesthesia Plan Comments:        Anesthesia Quick Evaluation

## 2017-04-22 NOTE — Interval H&P Note (Signed)
History and Physical Interval Note:  04/22/2017 7:17 AM  [name]  has presented today for surgery, with the diagnosis of LEFT HIP DEGENERATIVE JOINT DISEASE  The various methods of treatment have been discussed with the patient and family. After consideration of risks, benefits and other options for treatment, the patient has consented to  Procedure(s): TOTAL HIP ARTHROPLASTY ANTERIOR APPROACH (Left) as a surgical intervention .  [name] history has been reviewed, patient examined, no change in status, stable for surgery.  I have reviewed the patient's chart and labs.  Questions were answered to the patient's satisfaction.     Reyah Streeter G

## 2017-04-22 NOTE — Transfer of Care (Signed)
Immediate Anesthesia Transfer of Care Note  Patient: [name]  Procedure(s) Performed: TOTAL HIP ARTHROPLASTY ANTERIOR APPROACH (Left )  Patient Location: PACU  Anesthesia Type:Spinal  Level of Consciousness: drowsy and patient cooperative  Airway & Oxygen Therapy: Patient Spontanous Breathing and Patient connected to face mask oxygen  Post-op Assessment: Report given to RN and Post -op Vital signs reviewed and stable  Post vital signs: Reviewed and stable  Last Vitals:  Vitals:   04/22/17 0951 04/22/17 0955  BP: (!) 62/40 (!) 84/58  Pulse: (!) 55 (!) 57  Resp: 11 16  Temp: 36.4 C   SpO2: 100% 100%    Last Pain:  Vitals:   04/22/17 0553  TempSrc: Oral      Patients Stated Pain Goal: 3 (76/15/18 3437)  Complications: No apparent anesthesia complications

## 2017-04-22 NOTE — Evaluation (Signed)
Physical Therapy Evaluation Patient Details Name: [name] MRN: [id number] DOB: 10-10-1940 Today's Date: 04/22/2017   History of Present Illness  Pt is a 76 y/o male s/p L THA. PMH includes HTN, smoker, a fib, CKD, depression, s/p cardiac cath, and s/p lumbar surgery.   Clinical Impression  Pt s/p surgery above with deficits below. PTA, pt was using cane, RW, and crutches for ambulation secondary to pain, however, reports RW was very wobbly and unsteady. Upon eval, pt presenting with post op pain and weakness, however, tolerated gait training very well. Required min to min guard assist for mobility with RW this session. Reports wife will be able to assist as needed upon d/c. Will need RW for d/c to increase safety with mobility. Will continue to follow acutely to maximize functional mobility independence and safety.     Follow Up Recommendations DC plan and follow up therapy as arranged by surgeon;Supervision for mobility/OOB    Equipment Recommendations  Rolling walker with 5" wheels    Recommendations for Other Services       Precautions / Restrictions Precautions Precautions: None Precaution Comments: Reviewed supine THA handout with pt.  Restrictions Weight Bearing Restrictions: Yes LLE Weight Bearing: Weight bearing as tolerated      Mobility  Bed Mobility Overal bed mobility: Needs Assistance Bed Mobility: Supine to Sit     Supine to sit: Min assist     General bed mobility comments: Min A for LLE assist. Required use of bed rails and elevated HOB.   Transfers Overall transfer level: Needs assistance Equipment used: Rolling walker (2 wheeled) Transfers: Sit to/from Stand Sit to Stand: Min assist         General transfer comment: Min A for lift assist and steadying assist. Verbal cues for safe hand placement.   Ambulation/Gait Ambulation/Gait assistance: Min guard Ambulation Distance (Feet): 50 Feet Assistive device: Rolling walker (2 wheeled) Gait  Pattern/deviations: Step-to pattern;Decreased step length - right;Decreased step length - left;Step-through pattern;Decreased weight shift to left;Antalgic Gait velocity: Decreased  Gait velocity interpretation: Below normal speed for age/gender General Gait Details: Slow antalgic gait. Verbal cues for increased step height and for longer step length on LLE. Able to progress to step through, however, continued to demonstrate decreased weight bearing on LLE. Verbal cues for sequencing using RW.   Stairs            Wheelchair Mobility    Modified Rankin (Stroke Patients Only)       Balance Overall balance assessment: Needs assistance Sitting-balance support: No upper extremity supported;Feet supported Sitting balance-Leahy Scale: Good     Standing balance support: Bilateral upper extremity supported Standing balance-Leahy Scale: Poor Standing balance comment: Reliant on UE support                              Pertinent Vitals/Pain Pain Assessment: 0-10 Pain Score: 4  Pain Location: L hip  Pain Descriptors / Indicators: Aching;Operative site guarding Pain Intervention(s): Limited activity within patient's tolerance;Monitored during session;Repositioned    Home Living Family/patient expects to be discharged to:: Private residence Living Arrangements: Spouse/significant other Available Help at Discharge: Family;Available 24 hours/day Type of Home: House Home Access: Stairs to enter Entrance Stairs-Rails: Left Entrance Stairs-Number of Steps: 3 Home Layout: One level Home Equipment: Bedside commode;Cane - single point;Crutches      Prior Function Level of Independence: Independent with assistive device(s)         Comments: Used  cane and crutches for ambulation      Hand Dominance   Dominant Hand: Right    Extremity/Trunk Assessment   Upper Extremity Assessment Upper Extremity Assessment: Overall WFL for tasks assessed    Lower Extremity  Assessment Lower Extremity Assessment: LLE deficits/detail LLE Deficits / Details: Reports some tingling in toes. Deficits consistent with post op pain and weakness. Able to perform ther ex below.     Cervical / Trunk Assessment Cervical / Trunk Assessment: Normal  Communication   Communication: No difficulties  Cognition Arousal/Alertness: Awake/alert Behavior During Therapy: WFL for tasks assessed/performed Overall Cognitive Status: Within Functional Limits for tasks assessed                                        General Comments General comments (skin integrity, edema, etc.): Pt's wife and daughter present at end of session.     Exercises Total Joint Exercises Ankle Circles/Pumps: AROM;Both;20 reps Quad Sets: AROM;Left;10 reps Heel Slides: AROM;Left;10 reps   Assessment/Plan    PT Assessment Patient needs continued PT services  PT Problem List Decreased strength;Decreased range of motion;Decreased balance;Decreased mobility;Decreased knowledge of use of DME;Decreased knowledge of precautions;Pain       PT Treatment Interventions DME instruction;Gait training;Stair training;Functional mobility training;Therapeutic activities;Therapeutic exercise;Balance training;Neuromuscular re-education;Patient/family education    PT Goals (Current goals can be found in the Care Plan section)  Acute Rehab PT Goals Patient Stated Goal: to go home  PT Goal Formulation: With patient Time For Goal Achievement: 04/29/17 Potential to Achieve Goals: Good    Frequency 7X/week   Barriers to discharge        Co-evaluation               AM-PAC PT "6 Clicks" Daily Activity  Outcome Measure Difficulty turning over in bed (including adjusting bedclothes, sheets and blankets)?: A Little Difficulty moving from lying on back to sitting on the side of the bed? : Unable Difficulty sitting down on and standing up from a chair with arms (e.g., wheelchair, bedside commode,  etc,.)?: Unable Help needed moving to and from a bed to chair (including a wheelchair)?: A Little Help needed walking in hospital room?: A Little Help needed climbing 3-5 steps with a railing? : A Lot 6 Click Score: 13    End of Session Equipment Utilized During Treatment: Gait belt Activity Tolerance: Patient tolerated treatment well Patient left: in chair;with call bell/phone within reach;with family/visitor present Nurse Communication: Mobility status PT Visit Diagnosis: Other abnormalities of gait and mobility (R26.89);Pain Pain - Right/Left: Left Pain - part of body: Hip    Time: 6387-5643 PT Time Calculation (min) (ACUTE ONLY): 35 min   Charges:   PT Evaluation $PT Eval Low Complexity: 1 Low PT Treatments $Gait Training: 8-22 mins   PT G Codes:        Leighton Ruff, PT, DPT  Acute Rehabilitation Services  Pager: [phone]   Rudean Hitt 04/22/2017, 4:15 PM

## 2017-04-23 ENCOUNTER — Encounter (HOSPITAL_COMMUNITY): Payer: Self-pay | Admitting: Orthopaedic Surgery

## 2017-04-23 MED ORDER — TAMSULOSIN HCL 0.4 MG PO CAPS
0.4000 mg | ORAL_CAPSULE | Freq: Every day | ORAL | Status: DC
  Administered 2017-04-23 – 2017-04-24 (×2): 0.4 mg via ORAL
  Filled 2017-04-23 (×2): qty 1

## 2017-04-23 MED ORDER — DOCUSATE SODIUM 100 MG PO CAPS: 100.0000 mg | ORAL_CAPSULE | Freq: Two times a day (BID) | ORAL | 0 refills | Status: DC

## 2017-04-23 MED ORDER — TIZANIDINE HCL 4 MG PO TABS: 4.0000 mg | ORAL_TABLET | Freq: Four times a day (QID) | ORAL | 1 refills | Status: DC | PRN

## 2017-04-23 MED ORDER — OXYCODONE-ACETAMINOPHEN 5-325 MG PO TABS: 1.0000 | ORAL_TABLET | ORAL | 0 refills | Status: DC | PRN

## 2017-04-23 MED ORDER — ASPIRIN 325 MG PO TBEC: 325.0000 mg | DELAYED_RELEASE_TABLET | Freq: Two times a day (BID) | ORAL | 0 refills | Status: DC

## 2017-04-23 NOTE — Progress Notes (Signed)
Subjective: 1 Day Post-Op Procedure(s) (LRB): TOTAL HIP ARTHROPLASTY ANTERIOR APPROACH (Left)   Patient resting comfortably in bed. He is making urine but he states he is just producing a little at a time. He ad an issue with this a few weeks ago and he took flomax for a few days and it was much improved. He also has a non productive cough. This has been going on for about 2 days but is a little worse. He is using his incentive spirometer as directed.  Activity level:  wbat Diet tolerance:  ok Voiding:  ok Patient reports pain as mild.    Objective: Vital signs in last 24 hours: Temp:  [97.5 F (36.4 C)-98.3 F (36.8 C)] 97.9 F (36.6 C) (11/21 0544) Pulse Rate:  [55-74] 72 (11/21 0544) Resp:  [11-20] 20 (11/21 0544) BP: (62-136)/(40-82) 97/49 (11/21 0544) SpO2:  [96 %-100 %] 96 % (11/21 0544)  Labs: No results for input(s): HGB in the last 72 hours. No results for input(s): WBC, RBC, HCT, PLT in the last 72 hours. No results for input(s): NA, K, CL, CO2, BUN, CREATININE, GLUCOSE, CALCIUM in the last 72 hours. No results for input(s): LABPT, INR in the last 72 hours.  Physical Exam:  Neurologically intact ABD soft Neurovascular intact Sensation intact distally Intact pulses distally Dorsiflexion/Plantar flexion intact Incision: dressing C/D/I and no drainage No cellulitis present Compartment soft  Assessment/Plan:  1 Day Post-Op Procedure(s) (LRB): TOTAL HIP ARTHROPLASTY ANTERIOR APPROACH (Left) Advance diet Up with therapy Plan for discharge tomorrow Discharge home with home health if doing well and cleared by PT. I have started flomax to help with urination. HE will continue this once home. He will continue with his incentive spirometer and we will keep an eye on his cough. He will continue on ASA 325mg  BID x 4 weeks post op. Follow up in office 2 weeks post op.   Kandace Elrod, Larwance Sachs 04/23/2017, 7:53 AM

## 2017-04-23 NOTE — Progress Notes (Signed)
Physical Therapy Treatment Patient Details Name: [name]  MRN: [id number] DOB: 07-Aug-1940 Today's Date: 04/23/2017    History of Present Illness Pt is a 76 y/o male s/p L THA. PMH includes HTN, smoker, a fib, CKD, depression, s/p cardiac cath, and s/p lumbar surgery.     PT Comments    Session focused on progressing gait and stair training. Pt demonstrating improved independence with transfers and gait. Still ambulating very slow. Able to ascended/descend stairs with use of L rail and SPC and min guard at this time. Will focus on therex next visit.    Follow Up Recommendations  DC plan and follow up therapy as arranged by surgeon;Supervision for mobility/OOB     Equipment Recommendations  Rolling walker with 5" wheels    Recommendations for Other Services       Precautions / Restrictions Restrictions Weight Bearing Restrictions: Yes LLE Weight Bearing: Weight bearing as tolerated    Mobility  Bed Mobility               General bed mobility comments: OOB at entry  Transfers Overall transfer level: Needs assistance Equipment used: Rolling walker (2 wheeled) Transfers: Sit to/from Stand Sit to Stand: Min guard         General transfer comment: min guard during sit<>stands, cues for hand placement and sequencing with RW  Ambulation/Gait Ambulation/Gait assistance: Min guard Ambulation Distance (Feet): 100 Feet Assistive device: Rolling walker (2 wheeled) Gait Pattern/deviations: Step-to pattern;Decreased step length - right;Decreased step length - left;Step-through pattern;Decreased weight shift to left;Antalgic Gait velocity: Decreased  Gait velocity interpretation: Below normal speed for age/gender General Gait Details: Cues for safety with RW as patient allows it to travel too far infront of him, numerous cues but able to demonstrate improvement. Cues for step-through and heel strike.   Stairs Stairs: Yes   Stair Management: One rail Left;With  cane Number of Stairs: 8 General stair comments: Cues for sequencing and safety with rail and cane. pt needed reinfrocment with sequencing with cane, able to demonstrate safe dynamic balance with BUE support and min guard for home setup.   Wheelchair Mobility    Modified Rankin (Stroke Patients Only)       Balance Overall balance assessment: Needs assistance Sitting-balance support: No upper extremity supported;Feet supported Sitting balance-Leahy Scale: Good     Standing balance support: Bilateral upper extremity supported Standing balance-Leahy Scale: Fair Standing balance comment: Reliant on UE support during dynamic mobility                            Cognition Arousal/Alertness: Awake/alert Behavior During Therapy: WFL for tasks assessed/performed Overall Cognitive Status: Within Functional Limits for tasks assessed                                        Exercises      General Comments General comments (skin integrity, edema, etc.): VSS throughout session.       Pertinent Vitals/Pain Pain Assessment: 0-10 Pain Score: 4  Pain Location: L hip  Pain Descriptors / Indicators: Aching;Operative site guarding Pain Intervention(s): Limited activity within patient's tolerance;Monitored during session;Repositioned    Home Living                      Prior Function            PT  Goals (current goals can now be found in the care plan section) Acute Rehab PT Goals Patient Stated Goal: to go home  PT Goal Formulation: With patient Time For Goal Achievement: 04/29/17 Potential to Achieve Goals: Good Progress towards PT goals: Progressing toward goals    Frequency    7X/week      PT Plan Current plan remains appropriate    Co-evaluation              AM-PAC PT "6 Clicks" Daily Activity  Outcome Measure  Difficulty turning over in bed (including adjusting bedclothes, sheets and blankets)?: A Little Difficulty moving  from lying on back to sitting on the side of the bed? : Unable Difficulty sitting down on and standing up from a chair with arms (e.g., wheelchair, bedside commode, etc,.)?: A Little Help needed moving to and from a bed to chair (including a wheelchair)?: A Little Help needed walking in hospital room?: A Little Help needed climbing 3-5 steps with a railing? : A Little 6 Click Score: 16    End of Session Equipment Utilized During Treatment: Gait belt Activity Tolerance: Patient tolerated treatment well Patient left: in chair;with call bell/phone within reach;with family/visitor present Nurse Communication: Mobility status PT Visit Diagnosis: Other abnormalities of gait and mobility (R26.89);Pain Pain - Right/Left: Left Pain - part of body: Hip     Time: 5885-0277 PT Time Calculation (min) (ACUTE ONLY): 37 min  Charges:  $Gait Training: 23-37 mins                    G Codes:       Reinaldo Berber, PT, DPT Acute Rehab Services Pager: [phone]     Reinaldo Berber 04/23/2017, 1:19 PM

## 2017-04-23 NOTE — Progress Notes (Signed)
Physical Therapy Treatment Patient Details Name: [name] MRN: [id number] DOB: Feb 07, 1941 Today's Date: 04/23/2017    History of Present Illness Pt is a 76 y/o male s/p L THA. PMH includes HTN, smoker, a fib, CKD, depression, s/p cardiac cath, and s/p lumbar surgery.     PT Comments    PM session focused on progressing therex and family education in preparation for safe return home. Pt is able to demonstrate proper form with all therex at this time and family feel comfortable with current course of care. Next session should finalize acute PT with a focus on bed mobility and reinforcement of stair training.     Follow Up Recommendations  DC plan and follow up therapy as arranged by surgeon;Supervision for mobility/OOB     Equipment Recommendations  Rolling walker with 5" wheels    Recommendations for Other Services       Precautions / Restrictions Restrictions Weight Bearing Restrictions: Yes LLE Weight Bearing: Weight bearing as tolerated    Mobility  Bed Mobility Overal bed mobility: Needs Assistance Bed Mobility: Supine to Sit     Supine to sit: Min assist     General bed mobility comments: Min A for LLE assist, cues for RLE scooping but unable to do at this time   Transfers Overall transfer level: Needs assistance Equipment used: Rolling walker (2 wheeled) Transfers: Sit to/from Stand Sit to Stand: Min guard         General transfer comment: min guard during sit<>stands, cues for hand placement and sequencing with RW  Ambulation/Gait                 Stairs            Wheelchair Mobility    Modified Rankin (Stroke Patients Only)       Balance Overall balance assessment: Needs assistance Sitting-balance support: No upper extremity supported;Feet supported Sitting balance-Leahy Scale: Good     Standing balance support: Bilateral upper extremity supported Standing balance-Leahy Scale: Fair Standing balance comment: Reliant on UE  support during dynamic mobility                            Cognition Arousal/Alertness: Awake/alert Behavior During Therapy: WFL for tasks assessed/performed Overall Cognitive Status: Within Functional Limits for tasks assessed                                        Exercises Total Joint Exercises Ankle Circles/Pumps: AROM;Both;20 reps Quad Sets: AROM;Left;10 reps Short Arc Quad: AROM;Both;10 reps Heel Slides: AROM;Left;10 reps Hip ABduction/ADduction: AROM;Left;10 reps(Standing) Long Arc Quad: AROM;Both;10 reps Knee Flexion: AROM;Left;10 reps Marching in Standing: AROM;Both;10 reps Standing Hip Extension: AROM;Right;10 reps    General Comments General comments (skin integrity, edema, etc.): VSS throughout session, daughter and wife in room, educated family on exercsies as well and course of rehab, expectations and considerations for home safety.       Pertinent Vitals/Pain Pain Assessment: 0-10 Pain Score: 4  Pain Location: L hip  Pain Descriptors / Indicators: Aching;Operative site guarding Pain Intervention(s): Limited activity within patient's tolerance;Monitored during session;Repositioned    Home Living                      Prior Function            PT Goals (current goals can now  be found in the care plan section) Acute Rehab PT Goals Patient Stated Goal: to go home  PT Goal Formulation: With patient/family Time For Goal Achievement: 04/29/17 Potential to Achieve Goals: Good Progress towards PT goals: Progressing toward goals    Frequency    7X/week      PT Plan Current plan remains appropriate    Co-evaluation              AM-PAC PT "6 Clicks" Daily Activity  Outcome Measure  Difficulty turning over in bed (including adjusting bedclothes, sheets and blankets)?: A Little Difficulty moving from lying on back to sitting on the side of the bed? : A Lot Difficulty sitting down on and standing up from a chair  with arms (e.g., wheelchair, bedside commode, etc,.)?: A Little Help needed moving to and from a bed to chair (including a wheelchair)?: A Little Help needed walking in hospital room?: A Little Help needed climbing 3-5 steps with a railing? : A Little 6 Click Score: 17    End of Session Equipment Utilized During Treatment: Gait belt Activity Tolerance: Patient tolerated treatment well Patient left: in bed;with call bell/phone within reach;with family/visitor present Nurse Communication: Mobility status PT Visit Diagnosis: Other abnormalities of gait and mobility (R26.89);Pain Pain - Right/Left: Left Pain - part of body: Hip     Time: 1720-1750 PT Time Calculation (min) (ACUTE ONLY): 30 min  Charges:  $Therapeutic Exercise: 8-22 mins $Self Care/Home Management: 8-22                    G Codes:       [name], PT, DPT Acute Rehab Services Pager: [phone]     [name] 04/23/2017, 6:08 PM

## 2017-04-23 NOTE — Discharge Instructions (Signed)

## 2017-04-24 NOTE — Progress Notes (Signed)
Noted from CM note from 04/23/2017- patient does not want 3:1; RW ordered and to be delivered to the patient's room today prior to discharging home today; Aneta Mins [phone]

## 2017-04-24 NOTE — Progress Notes (Signed)
patient and family received Discharge teaching, Verbalizes understanding RN removed IV pt not in any distress.

## 2017-04-24 NOTE — Progress Notes (Signed)
Physical Therapy Treatment Patient Details Name: [name] MRN: [id number] DOB: 1941-03-16 Today's Date: 04/24/2017    History of Present Illness Pt is a 76 y/o male s/p L THA. PMH includes HTN, smoker, a fib, CKD, depression, s/p cardiac cath, and s/p lumbar surgery.     PT Comments    Pt progressing towards goals. Increased wooziness and fatigue this session, so only able to tolerate short ambulation distance. Practiced stair navigation X 2 this session with L rail and cane. Pt unsteady during first attempt, however, improved steadiness with second attempt. Discussed using BUE on L rail and performing stair navigation sideways. Pt gave verbal understanding. Pt requiring min to min guard assist with mobility. Current recommendations remain appropriate. Will continue to follow acutely to maximize functional mobility.   Follow Up Recommendations  DC plan and follow up therapy as arranged by surgeon;Supervision for mobility/OOB     Equipment Recommendations  Rolling walker with 5" wheels    Recommendations for Other Services       Precautions / Restrictions Precautions Precautions: None Restrictions Weight Bearing Restrictions: Yes LLE Weight Bearing: Weight bearing as tolerated    Mobility  Bed Mobility Overal bed mobility: Needs Assistance Bed Mobility: Supine to Sit     Supine to sit: Min assist     General bed mobility comments: Min A for trunk elevation; use of bed rails and elevated HOB.   Transfers Overall transfer level: Needs assistance Equipment used: Rolling walker (2 wheeled) Transfers: Sit to/from Stand Sit to Stand: Min guard         General transfer comment: Min guard for steadying assist. Increased time and effort required secondary to increased pain. Demonstrated safe hand placement.   Ambulation/Gait Ambulation/Gait assistance: Min guard;Supervision Ambulation Distance (Feet): 100 Feet Assistive device: Rolling walker (2 wheeled) Gait  Pattern/deviations: Step-to pattern;Decreased step length - right;Decreased step length - left;Step-through pattern;Decreased weight shift to left;Antalgic Gait velocity: Decreased  Gait velocity interpretation: Below normal speed for age/gender General Gait Details: Very guarded gait this session, and pt reporting some wooziness. Able to ambulate to therapy gym, however, required recliner to return to room. Cues for increased weightshift to the L to encourage step through gait pattern.    Stairs Stairs: Yes   Stair Management: One rail Left;With cane;Forwards;Step to pattern Number of Stairs: 4 General stair comments: Practiced stair navigation X2. Cues for LE sequencing with cane. Unsteadiness noted during first attempt of 2 steps. Min A for descent. Upon seated rest, pt improved steadiness and required min guard for steadying. Educated to use sideways technique with BUE on rail to improve steadiness. Educated about need for assist at home.   Wheelchair Mobility    Modified Rankin (Stroke Patients Only)       Balance Overall balance assessment: Needs assistance Sitting-balance support: No upper extremity supported;Feet supported Sitting balance-Leahy Scale: Good     Standing balance support: Bilateral upper extremity supported Standing balance-Leahy Scale: Fair Standing balance comment: Reliant on UE support during dynamic mobility                            Cognition Arousal/Alertness: Awake/alert Behavior During Therapy: WFL for tasks assessed/performed Overall Cognitive Status: Within Functional Limits for tasks assessed  Exercises      General Comments General comments (skin integrity, edema, etc.): Pt fatiguing very easily this session, so further exercise deferred.       Pertinent Vitals/Pain Pain Assessment: 0-10 Pain Score: 4  Pain Location: L hip  Pain Descriptors / Indicators:  Aching;Operative site guarding Pain Intervention(s): Limited activity within patient's tolerance;Monitored during session;Repositioned    Home Living                      Prior Function            PT Goals (current goals can now be found in the care plan section) Acute Rehab PT Goals Patient Stated Goal: to go home  PT Goal Formulation: With patient/family Time For Goal Achievement: 04/29/17 Potential to Achieve Goals: Good Progress towards PT goals: Progressing toward goals    Frequency    7X/week      PT Plan Current plan remains appropriate    Co-evaluation              AM-PAC PT "6 Clicks" Daily Activity  Outcome Measure  Difficulty turning over in bed (including adjusting bedclothes, sheets and blankets)?: A Little Difficulty moving from lying on back to sitting on the side of the bed? : Unable Difficulty sitting down on and standing up from a chair with arms (e.g., wheelchair, bedside commode, etc,.)?: A Lot Help needed moving to and from a bed to chair (including a wheelchair)?: A Little Help needed walking in hospital room?: A Little Help needed climbing 3-5 steps with a railing? : A Little 6 Click Score: 15    End of Session Equipment Utilized During Treatment: Gait belt Activity Tolerance: Patient limited by fatigue Patient left: in chair;with call bell/phone within reach Nurse Communication: Mobility status PT Visit Diagnosis: Other abnormalities of gait and mobility (R26.89);Pain Pain - Right/Left: Left Pain - part of body: Hip     Time: 3662-9476 PT Time Calculation (min) (ACUTE ONLY): 28 min  Charges:  $Gait Training: 23-37 mins                    G Codes:       [name], PT, DPT  Acute Rehabilitation Services  Pager: [phone]    Rudean Hitt 04/24/2017, 9:55 AM

## 2017-04-24 NOTE — Progress Notes (Signed)
   PATIENT ID: [name]   2 Days Post-Op Procedure(s) (LRB): TOTAL HIP ARTHROPLASTY ANTERIOR APPROACH (Left)  Subjective: Reports doing well today. Still some trouble sleeping overnight, improved with pain rx. Improving urination with Flomax. Improving dry cough- using IS. Feels like he can go home today.  Objective:  Vitals:   04/23/17 2017 04/24/17 0413  BP: 102/61 (!) 105/53  Pulse: 82 79  Resp: 18 13  Temp: 99 F (37.2 C) 99.6 F (37.6 C)  SpO2: 98% 95%      Neurologically intact ABD soft Neurovascular intact Sensation intact distally Intact pulses distally Dorsiflexion/Plantar flexion intact Incision: dressing with dried blood but not sealed superior aspect--changed today No cellulitis present Compartment soft    Labs:  No results for input(s): HGB in the last 72 hours.No results for input(s): WBC, RBC, HCT, PLT in the last 72 hours.No results for input(s): NA, K, CL, CO2, BUN, CREATININE, GLUCOSE, CALCIUM in the last 72 hours.  Assessment and Plan:  2 Days Post-Op Procedure(s) (LRB): TOTAL HIP ARTHROPLASTY ANTERIOR APPROACH (Left) Advance diet Up with therapy Plan for discharge today when cleared by PT Dressing changed today Discharge home with home health if doing well and cleared by PT. Continue flomax during stay and at home, fu w PCP if not continued improvement He will continue with his incentive spirometer-- he is to take this home with him upon d/c He will continue on ASA 325mg  BID x 4 weeks post op. Follow up in office 2 weeks post op.

## 2017-04-24 NOTE — Discharge Summary (Signed)
Patient ID: [name] MRN: [id number] DOB/AGE: [birth year] 76 y.o.  Admit date: 04/22/2017 Discharge date: 04/24/2017  Admission Diagnoses:  Principal Problem:   Primary osteoarthritis of left hip   Discharge Diagnoses:  Same  Past Medical History:  Diagnosis Date  . Anginal pain (HCC)    NONE IN 3 YEARS  . Arthritis   . Atrial fibrillation ([place]) 1962   a. No recurrence since 1962.  . Chest pain    a. 1998 Cath: nl cors;  b. 2009 Cath: nl cors.  . Chronic kidney disease    stage 3  . Depression   . Dysrhythmia    NO TROUBLE IN 1 YR    DR. PETER Martinique   . Esophageal reflux   . Essential hypertension   . History of Paroxysmal atrial flutter ([place])   . Hyperlipidemia   . Incontinence of urine   . Leg pain, bilateral   . Nocturia   . PONV (postoperative nausea and vomiting)    CONSTIPATED  . Scarlet fever 1945  . Spinal stenosis   . Staph skin infection    LEFT GROIN  04/11/16  TX W/ DOXYCYCLINE  . Ulcerative proctitis ([place])     Surgeries: Procedure(s): TOTAL HIP ARTHROPLASTY ANTERIOR APPROACH on 04/22/2017   Consultants:   Discharged Condition: Improved  Hospital Course: [name] is an 76 y.o. male who was admitted 04/22/2017 for operative treatment ofPrimary osteoarthritis of left hip. Patient has severe unremitting pain that affects sleep, daily activities, and work/hobbies. After pre-op clearance the patient was taken to the operating room on 04/22/2017 and underwent  Procedure(s): TOTAL HIP ARTHROPLASTY ANTERIOR APPROACH.    Patient was given perioperative antibiotics:  Anti-infectives (From admission, onward)   Start     Dose/Rate Route Frequency Ordered Stop   04/22/17 1400  ceFAZolin (ANCEF) IVPB 2g/100 mL premix     2 g 200 mL/hr over 30 Minutes Intravenous Every 6 hours 04/22/17 1125 04/22/17 2045   04/22/17 0600  vancomycin (VANCOCIN) 1,500 mg in sodium chloride 0.9 % 500 mL IVPB     1,500 mg 250 mL/hr over 120 Minutes Intravenous To  [facility] 04/21/17 1117 04/22/17 1779       Patient was given sequential compression devices, early ambulation, and asa to prevent DVT.  Patient benefited maximally from hospital stay and there were no complications.    Recent vital signs:  Patient Vitals for the past 24 hrs:  BP Temp Temp src Pulse Resp SpO2  04/24/17 0413 (!) 105/53 99.6 F (37.6 C) Oral 79 13 95 %  04/23/17 2017 102/61 99 F (37.2 C) Oral 82 18 98 %  04/23/17 1416 (!) 97/44 99 F (37.2 C) Oral 77 17 98 %  04/23/17 1000 (!) 116/55 - - - - -  04/23/17 0843 108/63 - - 78 18 97 %     Recent laboratory studies: No results for input(s): WBC, HGB, HCT, PLT, NA, K, CL, CO2, BUN, CREATININE, GLUCOSE, INR, CALCIUM in the last 72 hours.  Invalid input(s): PT, 2   Discharge Medications:   Allergies as of 04/24/2017      Reactions   Compazine [prochlorperazine Edisylate] Other (See Comments)   EXTRAPYRAMIDAL MOVEMENT [Involuntary muscle movement]   Penicillins Nausea And Vomiting, Rash, Other (See Comments)   Has patient had a PCN reaction causing immediate rash, facial/tongue/throat swelling, SOB or lightheadedness with hypotension: #  #  #  YES  #  #  #  Has patient had a PCN  reaction causing severe rash involving mucus membranes or skin necrosis: No Has patient had a PCN reaction that required hospitalization No Has patient had a PCN reaction occurring within the last 10 years: No If all of the above answers are "NO", then may proceed with Cephalosporin use.   Welchol [colesevelam Hcl] Other (See Comments)   MYALGIAS [Leg aches]   Gemfibrozil Nausea Only   PATIENT TOLERATES   Red Yeast Rice [cholestin] Other (See Comments)   flushing      Medication List    STOP taking these medications   meloxicam 15 MG tablet Commonly known as:  MOBIC   methocarbamol 500 MG tablet Commonly known as:  ROBAXIN     TAKE these medications   acetaminophen 325 MG tablet Commonly known as:  TYLENOL Take 650 mg  by mouth every 6 (six) hours as needed.   acetaminophen 500 MG tablet Commonly known as:  TYLENOL Take 500 mg every 6 (six) hours as needed by mouth for mild pain.   aspirin 325 MG EC tablet Take 1 tablet (325 mg total) by mouth 2 (two) times daily after a meal. What changed:    medication strength  how much to take  when to take this   carvedilol 3.125 MG tablet Commonly known as:  COREG Take 3.125 mg by mouth 2 (two) times daily with a meal.   clonazePAM 0.5 MG tablet Commonly known as:  KLONOPIN TAKE 1/2 TO 1 TABLET AT BEDTIME AS NEEDED. What changed:    how much to take  how to take this  when to take this  additional instructions   COQ10 PO Take 15 mLs by mouth daily.   docusate sodium 100 MG capsule Commonly known as:  COLACE Take 1 capsule (100 mg total) by mouth 2 (two) times daily.   DULoxetine 60 MG capsule Commonly known as:  CYMBALTA Take 60 mg daily by mouth.   ezetimibe 10 MG tablet Commonly known as:  ZETIA Take 1 tablet (10 mg total) by mouth daily.   lisinopril 2.5 MG tablet Commonly known as:  PRINIVIL,ZESTRIL Take 2 tablets (5 mg total) by mouth daily.   lisinopril 10 MG tablet Commonly known as:  PRINIVIL,ZESTRIL Take 10 mg daily by mouth.   LYRICA 100 MG capsule Generic drug:  pregabalin TAKE (1) CAPSULE TWICE DAILY. What changed:  See the new instructions.   Melatonin 10 MG Caps Take 10 mg at bedtime by mouth.   nitroGLYCERIN 0.4 MG SL tablet Commonly known as:  NITROSTAT Place 0.4 mg under the tongue every 5 (five) minutes as needed for chest pain.   omeprazole 20 MG tablet Commonly known as:  PRILOSEC OTC Take 20 mg by mouth daily.   oxyCODONE-acetaminophen 5-325 MG tablet Commonly known as:  PERCOCET/ROXICET Take 1-2 tablets by mouth every 4 (four) hours as needed for moderate pain. What changed:  how much to take   polyethylene glycol packet Commonly known as:  MIRALAX / GLYCOLAX Take 17 g by mouth daily as  needed for mild constipation.   rosuvastatin 10 MG tablet Commonly known as:  CRESTOR TAKE 1 TABLET ONCE DAILY OR AS DIRECTED. What changed:  See the new instructions.   sennosides-docusate sodium 8.6-50 MG tablet Commonly known as:  SENOKOT-S Take 2 tablets by mouth at bedtime as needed for constipation.   tiZANidine 4 MG tablet Commonly known as:  ZANAFLEX Take 1 tablet (4 mg total) by mouth every 6 (six) hours as needed for muscle spasms.  vitamin C 500 MG tablet Commonly known as:  ASCORBIC ACID Take 500 mg by mouth daily.            Durable Medical Equipment  (From admission, onward)        Start     Ordered   04/22/17 1126  DME Walker rolling  Once    Question:  Patient needs a walker to treat with the following condition  Answer:  Primary osteoarthritis of left hip   04/22/17 1125   04/22/17 1126  DME 3 n 1  Once     04/22/17 1125   04/22/17 1126  DME Bedside commode  Once    Question:  Patient needs a bedside commode to treat with the following condition  Answer:  Primary osteoarthritis of left hip   04/22/17 1125      Diagnostic Studies: Dg Chest 2 View  Result Date: 04/14/2017 CLINICAL DATA:  Preop left hip replacement EXAM: CHEST  2 VIEW COMPARISON:  11/26/2010 FINDINGS: There is no focal parenchymal opacity. There is no pleural effusion or pneumothorax. The heart and mediastinal contours are unremarkable. There is a moderate size hiatal hernia. The osseous structures are unremarkable. IMPRESSION: No active cardiopulmonary disease. Electronically Signed   By: Kathreen Devoid   On: 04/14/2017 10:19   Dg C-arm 1-60 Min  Result Date: 04/22/2017 CLINICAL DATA:  Left total hip joint prosthesis placement. Fluoro time reported is 32 seconds EXAM: OPERATIVE left HIP (WITH PELVIS IF PERFORMED) two VIEWS TECHNIQUE: Fluoroscopic spot image(s) were submitted for interpretation post-operatively. COMPARISON:  None in PACs FINDINGS: The patient has undergone left total hip  joint prosthesis placement. Radiographic positioning of the prosthetic components is good. The interface with the native bone appears normal. IMPRESSION: No immediate postprocedure complication following left total hip joint prosthesis placement. Electronically Signed   By: David  Martinique M.D.   On: 04/22/2017 09:29   Dg Hip Operative Unilat W Or W/o Pelvis Left  Result Date: 04/22/2017 CLINICAL DATA:  Left total hip joint prosthesis placement. Fluoro time reported is 32 seconds EXAM: OPERATIVE left HIP (WITH PELVIS IF PERFORMED) two VIEWS TECHNIQUE: Fluoroscopic spot image(s) were submitted for interpretation post-operatively. COMPARISON:  None in PACs FINDINGS: The patient has undergone left total hip joint prosthesis placement. Radiographic positioning of the prosthetic components is good. The interface with the native bone appears normal. IMPRESSION: No immediate postprocedure complication following left total hip joint prosthesis placement. Electronically Signed   By: David  Martinique M.D.   On: 04/22/2017 09:29    Disposition: 01-Home or Self Care  Discharge Instructions    Call MD / Call 911   Complete by:  As directed    If you experience chest pain or shortness of breath, CALL 911 and be transported to the hospital emergency room.  If you develope a fever above 101 F, pus (white drainage) or increased drainage or redness at the wound, or calf pain, call your surgeon's office.   Constipation Prevention   Complete by:  As directed    Drink plenty of fluids.  Prune juice may be helpful.  You may use a stool softener, such as Colace (over the counter) 100 mg twice a day.  Use MiraLax (over the counter) for constipation as needed.   Diet - low sodium heart healthy   Complete by:  As directed    Increase activity slowly as tolerated   Complete by:  As directed       Follow-up Information    Dalldorf,  Collier [place], MD. Schedule an appointment as soon as possible for a visit in 2 week(s).    Specialty:  Orthopedic Surgery Contact information: [place] [address] [phone]        [facility] Follow up.   Specialty:  Home Health Services Why:  For home health services.  Contact information: [address] [place] [address] [phone]            Signed: Grier Mitts 04/24/2017, 7:36 AM

## 2017-08-06 ENCOUNTER — Ambulatory Visit: Payer: Medicare Other | Admitting: Adult Health

## 2017-08-07 ENCOUNTER — Ambulatory Visit: Payer: Medicare Other | Admitting: Neurology

## 2017-08-07 ENCOUNTER — Encounter: Payer: Self-pay | Admitting: Neurology

## 2017-08-07 ENCOUNTER — Other Ambulatory Visit: Payer: Self-pay | Admitting: Neurology

## 2017-08-07 VITALS — BP 123/78 | HR 81 | Ht 70.0 in | Wt 247.0 lb

## 2017-08-07 DIAGNOSIS — G2581 Restless legs syndrome: Secondary | ICD-10-CM | POA: Diagnosis not present

## 2017-08-07 MED ORDER — ROPINIROLE HCL 0.5 MG PO TABS: 0.5000 mg | ORAL_TABLET | Freq: Every day | ORAL | 11 refills | Status: DC

## 2017-08-07 MED ORDER — CLONAZEPAM 0.5 MG PO TABS: 0.5000 mg | ORAL_TABLET | Freq: Every evening | ORAL | 5 refills | Status: DC | PRN

## 2017-08-07 MED ORDER — OXYCODONE-ACETAMINOPHEN 5-325 MG PO TABS: 1.0000 | ORAL_TABLET | Freq: Every day | ORAL | 0 refills | Status: DC

## 2017-08-07 NOTE — Progress Notes (Signed)
PATIENT: [name] DOB: 13-Oct-1940  REASON FOR VISIT: follow up HISTORY FROM: patient  HISTORY OF PRESENT ILLNESS: HISTORY Per [name] notes: HISTORY : [name] is a 77 yo RH male, accompanied by his wife, referred by [name], and his primary care physician [name] for evaluation of chronic low back pain, bilateral lower extremity muscle ache He had a past medical history of hypertension, hyperlipidemia, presenting with chronic low back pain, bilateral lower extremity muscle achy pain since 2010.  Initially it was thought due to his statin treatment, he has tried different statin without improving his symptoms,over the years, he also received multiple lumbar bilateral facet joint fluoroscopy guided injection without improving his symptoms.  In 2012, he was diagnosed with L4 and 5 lumbar stenosis, had lumbar laminectomy L3-4, L4-L5 by Dr. Joya Hawkins in July 2012,without improving his symptoms, He was seen by different specialists, tried different medications, this including Elavil, Xanax as needed, he is currently taking hydrocodone 5/325 mg 1-2 tablets every night, Xanax 0.25 milligram as needed, Flexeril 10 mg every night, gabapentin 600 mg every night without significant improvement, During the daytime, he denies significant low back pain, or bilateral lower extremity pain, he does has history of right ankle fracture, mild gait difficulty due to right ankle pain, but starting at evening time, he began to noticed deep achy pain starting from midline low back, radiating along bilateral lateral leg, constant, he tends to pace around, has difficulty sleeping at nighttime, sometimes he has to got up take a hot bath couple times every night, put ice on his back, on his feet, only provide temporary relief. He has also tried acupuncture, massage, TENS unit, without significant improvement, previously, he has a short trial of low-dose Requip, without improving his symptoms, no significant  side effect noticed either. He has bilateral lateral 3 toes numbness, no weakness, no bilateral fingertips numbness, or weakness Per record, electrodiagnostic study failed to demonstrate significant etiology, We have reviewed MRI lumbar in 2012, there was evidence of L4-5 spinal stenosis, and the most recent MRI Lumbar was in June 2014,progression of multilevel facet arthropathy, status post a laminectomy at L3-4, with residual large synovial cyst extending along the posterior epidural space without significant central canal stenosis, mild foraminal narrowing bilaterally at L3 and 4, progression of mild lateral recess narrowing bilaterally at L4-5, foraminal narrowing is stable at this level MRI of the brain in June 2014 showed mild atrophy, no acute intracranial abnormality  UPDATE Dec 15th 2015:  He is tolerating gabapentin 300 mg 2 tablets every night, Requip 1 mg 2 tablets every night, he can sleep better with the medications, but continue complains of bilateral lower extremity dull achy pain, numbness, urgency to move, especially at nighttime, He also complains of slow progressive gait difficulty, grasps things for support, he has urinary urgency, but no incontinence.  UPDATE Jan 29th 2016: He is now taking gabapentin 300mg  2 tabs qhs and requip 1mg , 3 tabs qhs, which has been very helpful, 30%, improvement, he still has bilatarel leg pain at evening, stretching,massage, He denies bowel and bladder incontinence , he has tinlging at the soles of his feet.   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  He continue complaining significant difficulty at nighttime, came in with a  full page list of symptoms, in the morning time, he denies significant back pain, no low back pain, symptoms started in the late afternoon, gradually building up, around 9: to10 PM, he noticed achy feeling down lateral side of both leg, increased by sitting still, intense urge to lie on the floor, stretching muscle backwards, late night hot soaking bath to help him sleep, he has to take about 3 AM last night, difficulty sleeping, excessive fatigue during the daytime, could not sit through movies, standing up in the aisle 2/3 of the movie He is now taking, hydrocodone 5/325mg  2 tabs 9pm, advil 200mg  2 tab   He has quit taking Requip 1 mg, up to 4 tablets every night, also gabapentin, complains of upset stomach, without relieving his symptoms, he is now taking Advil 200 mg 2 tablets as needed, hydrocodone/Tylenol 5/325 mg 2 tablets every night for symptoms control, which only mild, temporarily, Tylenol helps some too.  UPDATE April 29th 2016:Laboratory showed low iron, ferritin level at 9, he was put on iron supplement, his restless leg symptoms has much improved, He is now taking clonazepam 0.5 milligram every night, Lyrica 50 mg every night, he can sleep much better,  He continue, combat right ankle pain  UPDATE August 2nd 2016: He sleeps well, he is now taking clonazepam 0.5mg  qhs, Lyrica 50mg  3 tabs qhs, no longer on Hydrocodone, he complains of side effect  UPDATE 07/10/15 (MM): 07/10/15: [name] is a 77 year old male with a history of restless leg syndrome. He returns today for follow-up. The patient states that since he began taking Klonopin , Lyrica and the iron supplement his symptoms have drastically improved. He states that he typically takes a hot bath before bed and this has also helped his symptoms. He does not have any of the symptoms during the day. He states that occasionally he'll have the achy feeling in his legs but nothing compared to what it was. He states that he is now  sleeping well. He states that he did decrease the Klonopin after having an episode of urinary incontinence during the night. He states that since he decreased the Klonopin he has not had this happen again. He denies any new neurological symptoms. He returns today for an evaluation.  UDATE 01/03/16  Mr. Quattrone is a 77 year old male with a history of restless leg syndrome. He returns today for follow-up. He continues to get good benefit taking Lyrica Klonopin and iron. He states that he "feels the past he's felt in years." His wife just recently returned from a trip to [place]. he states he has been experiencing some back pain and plans to get an epidural steroid injection with [name]. He denies any new neurological symptoms. He returns today for follow-up.  UPDATE August 06 2016: I reviewed operation record on April 18 2016, he had right lumbar L3-4, L4-5 anterolateral lumbar interbody fusion, percutaneous pedicle screw on April 18 2016, which has helped his left-sided low back pain radiating pain to left lower extremity, 4. Of time he was almost pain-free, unfortunately he fell landed on his right side, is then he began to experience excruciating radiating pain from left side to left anterior thigh.  We have personally reviewed and compared to MRI lumbar in October 2017, and in February 2018. Which showed pedicle screw and interbody fusion at L3-4, L4-5 without stenosis, small fluid collection posterior to the thecal sac at L3-4, moderate right foraminal  narrowing, mild foraminal narrowing at L4-5.  He is planning on to have second left-sided lumbar decompression surgery on August 15 2016  His restless leg symptoms is under good control with current dose of Lyrica 100 mg twice a day, clonazepam as needed, oxycodone 5 mg every night  His most recent ferritin level was 47, hemoglobin was 11 point 5,   UPDATE August 07 2017: He is accompanied by his wife at today's clinical visit, complaining  worsening restless leg symptoms, for a while, his restless leg symptoms seems to be under okay control.  Since 2017, he had multiple surgery, lumbar fusion by Dr. Birdena Crandall November 2017, redo in March 2018, left hip replacement in November 2018,  He has baseline gait abnormality, but no longer have significant pain, at nighttime, 1-2 hours after lying down, he has uncontrollable right leg muscle jerking, urged to move,  He is now taking Lyrica 100 mg 2 tablets at nighttime, clonazepam 0.5 mg every night, often have to take 1-1/2 tablets of Percocet 5/325 mg early morning time if he could not sleep his combination medication treatment.  Sometimes he has to take hot bath in the middle of the night to help him go to sleep.   REVIEW OF SYSTEMS: Out of a complete 14 system review of symptoms, the patient complains only of the following symptoms, and all other reviewed systems are negative.  Incontinence of bladder, walking difficulty, depression, restless leg, ringing in ears  ALLERGIES: Allergies  Allergen Reactions  . Compazine [Prochlorperazine Edisylate] Other (See Comments)    EXTRAPYRAMIDAL MOVEMENT [Involuntary muscle movement]  . Penicillins Nausea And Vomiting, Rash and Other (See Comments)    Has patient had a PCN reaction causing immediate rash, facial/tongue/throat swelling, SOB or lightheadedness with hypotension: #  #  #  YES  #  #  #  Has patient had a PCN reaction causing severe rash involving mucus membranes or skin necrosis: No Has patient had a PCN reaction that required hospitalization No Has patient had a PCN reaction occurring within the last 10 years: No If all of the above answers are "NO", then may proceed with Cephalosporin use.   [name]] Other (See Comments)    MYALGIAS [Leg aches]  . Gemfibrozil Nausea Only    PATIENT TOLERATES  . Red Yeast Rice [Cholestin] Other (See Comments)    flushing    HOME MEDICATIONS: Outpatient Medications Prior  to Visit  Medication Sig Dispense Refill  . ASPIRIN 81 PO Take 1 tablet by mouth daily.    . carvedilol (COREG) 3.125 MG tablet Take 3.125 mg by mouth 2 (two) times daily with a meal.     . clonazePAM (KLONOPIN) 0.5 MG tablet Take 0.5 mg by mouth at bedtime as needed for anxiety.    . DULoxetine (CYMBALTA) 60 MG capsule Take 60 mg daily by mouth.     . esomeprazole (NEXIUM) 20 MG capsule Take 20 mg by mouth daily at 12 noon.    . ezetimibe (ZETIA) 10 MG tablet Take 1 tablet (10 mg total) by mouth daily. 30 tablet 6  . ibuprofen (ADVIL,MOTRIN) 200 MG tablet Take 400 mg by mouth as needed.    Marland Kitchen lisinopril (PRINIVIL,ZESTRIL) 10 MG tablet Take 10 mg by mouth at bedtime.     Marland Kitchen LYRICA 100 MG capsule TAKE (1) CAPSULE TWICE DAILY. (Patient taking differently: TAKE (2) CAPSULES AT BEDTIME.) 180 capsule 1  . methocarbamol (ROBAXIN) 500 MG tablet Take 500 mg by mouth every 8 (  eight) hours as needed for muscle spasms.    . Multiple Vitamins-Minerals (MULTIVITAMIN PO) Take 1 tablet by mouth daily.    Marland Kitchen oxyCODONE-acetaminophen (PERCOCET/ROXICET) 5-325 MG tablet Take 1-2 tablets by mouth every 4 (four) hours as needed for moderate pain. (Patient taking differently: Take 1-2 tablets by mouth every 8 (eight) hours as needed for moderate pain. ) 40 tablet 0  . rosuvastatin (CRESTOR) 10 MG tablet TAKE 1 TABLET ONCE DAILY OR AS DIRECTED. 90 tablet 2  . traMADol (ULTRAM) 50 MG tablet Take 50 mg by mouth every 8 (eight) hours as needed.    . clonazePAM (KLONOPIN) 0.5 MG tablet TAKE 1/2 TO 1 TABLET AT BEDTIME AS NEEDED. (Patient taking differently: Take 0.5 mg at bedtime by mouth. ) 30 tablet 5  . nitroGLYCERIN (NITROSTAT) 0.4 MG SL tablet Place 0.4 mg under the tongue every 5 (five) minutes as needed for chest pain.    . polyethylene glycol (MIRALAX / GLYCOLAX) packet Take 17 g by mouth daily as needed for mild constipation.    Marland Kitchen acetaminophen (TYLENOL) 325 MG tablet Take 650 mg by mouth every 6 (six) hours as needed.     Marland Kitchen acetaminophen (TYLENOL) 500 MG tablet Take 500 mg every 6 (six) hours as needed by mouth for mild pain.    Marland Kitchen aspirin EC 325 MG EC tablet Take 1 tablet (325 mg total) by mouth 2 (two) times daily after a meal. 60 tablet 0  . Coenzyme Q10 (COQ10 PO) Take 15 mLs by mouth daily.    Marland Kitchen docusate sodium (COLACE) 100 MG capsule Take 1 capsule (100 mg total) by mouth 2 (two) times daily. 30 capsule 0  . lisinopril (PRINIVIL,ZESTRIL) 2.5 MG tablet Take 2 tablets (5 mg total) by mouth daily. (Patient not taking: Reported on 04/08/2017) 90 tablet 3  . Melatonin 10 MG CAPS Take 10 mg at bedtime by mouth.    Marland Kitchen omeprazole (PRILOSEC OTC) 20 MG tablet Take 20 mg by mouth daily.    . sennosides-docusate sodium (SENOKOT-S) 8.6-50 MG tablet Take 2 tablets by mouth at bedtime as needed for constipation.     Marland Kitchen tiZANidine (ZANAFLEX) 4 MG tablet Take 1 tablet (4 mg total) by mouth every 6 (six) hours as needed for muscle spasms. 40 tablet 1  . vitamin C (ASCORBIC ACID) 500 MG tablet Take 500 mg by mouth daily.     No facility-administered medications prior to visit.     PAST MEDICAL HISTORY: Past Medical History:  Diagnosis Date  . Anginal pain (HCC)    NONE IN 3 YEARS  . Arthritis   . Atrial fibrillation ([place]) 1962   a. No recurrence since 1962.  . Chest pain    a. 1998 Cath: nl cors;  b. 2009 Cath: nl cors.  . Chronic kidney disease    stage 3  . Depression   . Dysrhythmia    NO TROUBLE IN 1 YR    DR. PETER [place]   . Esophageal reflux   . Essential hypertension   . History of Paroxysmal atrial flutter ([place])   . Hyperlipidemia   . Incontinence of urine   . Leg pain, bilateral   . Nocturia   . PONV (postoperative nausea and vomiting)    CONSTIPATED  . Scarlet fever 1945  . Spinal stenosis   . Staph skin infection    LEFT GROIN  04/11/16  TX W/ DOXYCYCLINE  . Ulcerative proctitis ([place])     PAST SURGICAL HISTORY: Past Surgical History:  Procedure Laterality Date  . ANKLE RECONSTRUCTION Right  2007   [place]  . ANTERIOR LAT LUMBAR FUSION Right 04/18/2016   Procedure: RIGHT LUMBAR THREE-FOUR, LUMBAR FOUR-FIVE ANTEROLATERAL LUMBAR INTERBODY FUSION;  Surgeon: Erline Levine, MD;  Location: [facility];  Service: Neurosurgery;  Laterality: Right;  RIGHT L3-4 L4-5 ANTEROLATERAL LUMBAR INTERBODY FUSION  . CARDIAC CATHETERIZATION  03/03/2008   Left heart cardiac catheterization and coronary  . CARDIAC CATHETERIZATION  1997   Dr Wynonia Lawman  . CATARACT EXTRACTION W/ INTRAOCULAR LENS  IMPLANT, BILATERAL     2016  . ESOPHAGEAL DILATION     2008  . INNER EAR SURGERY     EAR INJECTION FOR MENIERES  . KNEE ARTHROSCOPY  1991, 1995  . LUMBAR LAMINECTOMY  12/2010  . LUMBAR LAMINECTOMY/DECOMPRESSION MICRODISCECTOMY Left 08/15/2016   Procedure: Left Left three- four Redo laminectomy;  Surgeon: Erline Levine, MD;  Location: [facility];  Service: Neurosurgery;  Laterality: Left;  Left L3-4 Redo laminectomy  . LUMBAR PERCUTANEOUS PEDICLE SCREW 2 LEVEL N/A 04/18/2016   Procedure: LUMBAR THREE-FOUR, LUMBAR FOUR-FIVE PERCUTANEOUS PEDICLE SCREW;  Surgeon: Erline Levine, MD;  Location: [facility];  Service: Neurosurgery;  Laterality: N/A;  . 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 TOTAL HIP ARTHROPLASTY Left 04/22/2017   Procedure: TOTAL HIP ARTHROPLASTY ANTERIOR APPROACH;  Surgeon: Melrose Nakayama, MD;  Location: [facility];  Service: Orthopedics;  Laterality: Left;    FAMILY HISTORY: Family History  Problem Relation Age of Onset  . Prostate cancer Father   . Heart attack Brother   . Kidney cancer Brother   . Brain cancer Brother     SOCIAL HISTORY: Social History   Socioeconomic History  . Marital status: Married    Spouse name: Bethena Roys  . Number of children: 3  . Years of education: college  . Highest education level: Not on file  Social Needs  . Financial resource strain: Not on file  . Food insecurity - worry: Not on file  . Food insecurity - inability: Not on file  . Transportation needs - medical: Not on file    . Transportation needs - non-medical: Not on file  Occupational History    Comment: Retired   Tobacco Use  . Smoking status: Current Some Day Smoker    Types: Pipe  . Smokeless tobacco: Never Used  Substance and Sexual Activity  . Alcohol use: Yes    Alcohol/week: 0.6 oz    Types: 1 Shots of liquor per week    Comment: 1-3 weekly  . Drug use: No  . Sexual activity: Not on file  Other Topics Concern  . Not on file  Social History Narrative   Patient is retired and lives at home with his wife Bethena Roys.    Education college.   Caffeine one cup daily.      PHYSICAL EXAM  Vitals:   08/07/17 0715  BP: 123/78  Pulse: 81  Weight: 247 lb (112 kg)  Height: 5\' 10"  (1.778 m)   Body mass index is 35.44 kg/m.  Generalized: Well developed, in no acute distress   Neurological examination  Mentation: Alert oriented to time, place, history taking. Follows all commands speech and language fluent Cranial nerve II-XII: Pupils were equal round reactive to light. Extraocular movements were full, visual field were full on confrontational test. Facial sensation and strength were normal. Uvula tongue midline. Head turning and shoulder shrug  were normal and symmetric. Motor: The motor testing reveals 5 over 5 strength of all 4 extremities. Good symmetric  motor tone is noted throughout.  Sensory: Sensory testing is intact to soft touch on all 4 extremities. No evidence of extinction is noted.  Coordination: Cerebellar testing reveals good finger-nose-finger and heel-to-shin bilaterally.  Gait and station: Gait is normal. Tandem gait is normal. Romberg is negative. No drift is seen.  Reflexes: Deep tendon reflexes are symmetric and normal bilaterally.   DIAGNOSTIC DATA (LABS, IMAGING, TESTING) - I reviewed patient records, labs, notes, testing and imaging myself where available.  Lab Results  Component Value Date   WBC 5.5 04/14/2017   HGB 13.1 04/14/2017   HCT 40.6 04/14/2017   MCV 96.0  04/14/2017   PLT 184 04/14/2017      Component Value Date/Time   NA 139 04/14/2017 1000   NA 138 04/29/2016   K 4.8 04/14/2017 1000   CL 105 04/14/2017 1000   CO2 25 04/14/2017 1000   GLUCOSE 93 04/14/2017 1000   BUN 29 (H) 04/14/2017 1000   BUN 19 04/29/2016   CREATININE 1.30 (H) 04/14/2017 1000   CALCIUM 8.8 (L) 04/14/2017 1000   PROT 6.3 01/13/2017 0846   ALBUMIN 4.4 01/13/2017 0846   AST 26 01/13/2017 0846   ALT 20 01/13/2017 0846   ALKPHOS 79 01/13/2017 0846   BILITOT 0.4 01/13/2017 0846   GFRNONAA 51 (L) 04/14/2017 1000   GFRAA 59 (L) 04/14/2017 1000   Lab Results  Component Value Date   CHOL 90 (L) 01/13/2017   HDL 29 (L) 01/13/2017   LDLCALC 33 01/13/2017   TRIG 138 01/13/2017   CHOLHDL 3.8 01/18/2016      ASSESSMENT AND PLAN 77 y.o. year old male   Restless leg symptoms  Check ferritin level  Continue Lyrica 100 mg 2 tablets at bedtime, Cymbalta 60 mg every morning,  Add on Requip 0.5 mg at bedtime,  Will try to avoid combination of benzodiazepine and opiates, I have suggested him to try clonazepam 0.5 mg every night first, if that help his symptoms, will taper off Percocet, if clonazepam does not adequately control his restless leg symptoms, it is okay to use Percocet 5/325 mg 1 tablet for his restless leg symptoms,  Marcial Pacas, M.D. Ph.D.  [facility] [place], [place] [address] Phone: [phone] Fax:      [phone]

## 2017-08-08 ENCOUNTER — Encounter: Payer: Self-pay | Admitting: Neurology

## 2017-08-08 ENCOUNTER — Telehealth: Payer: Self-pay | Admitting: Neurology

## 2017-08-08 LAB — FERRITIN: FERRITIN: 15 ng/mL — AB (ref 30–400)

## 2017-08-08 LAB — TSH: TSH: 1.64 u[IU]/mL (ref 0.450–4.500)

## 2017-08-08 MED ORDER — FERROUS FUM-IRON POLYSACCH 162-115.2 MG PO CAPS: 1.0000 | ORAL_CAPSULE | Freq: Every day | ORAL | 11 refills | Status: DC

## 2017-08-08 NOTE — Telephone Encounter (Signed)
Mr. Braley: I have sent lab result to my chart, please also call patient,   Your ferritin level is low 15 ng/ml.  For patient with restless leg syndrome, we want your ferritin level to be maintained at 50.  I have called in iron supplement "ferrous fumarate-iron polysaccharide complex (TANDEM) 162-115.2 MG CAPS capsule" to [facility].  You should take 1 capsule daily.  Please also keep up your yearly follow-up, including recommended colonoscopy to look for the potential resource of iron loss.  Marcial Pacas, M.D. Ph.D.  [facility] [place], [place] [address] Phone: [phone] Fax:      [phone]

## 2017-08-08 NOTE — Telephone Encounter (Signed)
I spoke with patient and he did see the MyChart result note. He voiced understanding and did not have any other questions at this time.

## 2017-08-09 ENCOUNTER — Encounter: Payer: Self-pay | Admitting: Neurology

## 2017-08-12 ENCOUNTER — Other Ambulatory Visit: Payer: Self-pay | Admitting: *Deleted

## 2017-08-13 ENCOUNTER — Other Ambulatory Visit: Payer: Self-pay | Admitting: *Deleted

## 2017-08-13 ENCOUNTER — Telehealth: Payer: Self-pay | Admitting: *Deleted

## 2017-08-13 MED ORDER — POLYSACCHARIDE IRON COMPLEX 150 MG PO CAPS: 150.0000 mg | ORAL_CAPSULE | Freq: Every day | ORAL | 11 refills | Status: DC

## 2017-08-13 NOTE — Telephone Encounter (Signed)
Received message from [facility] that Tandem is no longer being made.  The pharmacy requested approval for a prescription change to Nu Iron 150mg , one capsule daily.  Per vo by Dr. [name], ok to send in this replacement prescription (generic sent).  Pharmacy and patient aware of change in therapy.

## 2017-09-29 ENCOUNTER — Other Ambulatory Visit: Payer: Self-pay | Admitting: Neurology

## 2017-09-29 NOTE — Telephone Encounter (Signed)
Faxed printed/signed rx Lyrica 100mg  capsule to [facility] at [phone]. Received fax confirmation.

## 2018-01-12 ENCOUNTER — Telehealth: Payer: Self-pay | Admitting: *Deleted

## 2018-01-12 NOTE — Telephone Encounter (Signed)
Left message requesting a return call.

## 2018-01-12 NOTE — Telephone Encounter (Signed)
Attempted to reach patient again before leaving.  Left another message requesting a return call.

## 2018-01-12 NOTE — Telephone Encounter (Signed)
Email from patient:  Hello, Dr [name],  My insurance company will no longer cover [name], but is covering the generic version.   I have enough Lyrica for two more weeks, (run out August 26), and will need a generic prescription sent to my pharmacy, [facility].  I am now taking 2 (100 mg caps) x night, total 200 mg.  I am leaving on a cruise August 30 and will return Sept 8, and have an appointment with you on Sept 9, so I will need a resupply to take with me on the cruise.  I think it is time to increase the dosage since the tingling in my feet and legs is increasing, but I can wait util the Office Visit Sept 9 if necessary.  Regards,  [name]

## 2018-01-12 NOTE — Telephone Encounter (Addendum)
Dr. [name] has reviewed his chart.  Per vo by Dr. [name], ok to provide rx for generic Lyrica 100mg , taking up to three per day.  He can dose himself when he has the most difficulty with his symptoms, within the limit of three capsules daily.

## 2018-01-13 MED ORDER — PREGABALIN 100 MG PO CAPS: 100.0000 mg | ORAL_CAPSULE | Freq: Three times a day (TID) | ORAL | 5 refills | Status: DC

## 2018-01-13 NOTE — Addendum Note (Signed)
Addended by: Desmond Lope on: 01/13/2018 08:13 AM   Modules accepted: Orders

## 2018-01-13 NOTE — Addendum Note (Signed)
Addended by: Marcial Pacas on: 01/13/2018 12:01 PM   Modules accepted: Orders

## 2018-01-13 NOTE — Telephone Encounter (Addendum)
Spoke to patient - he is agreeable to the increased dose of Lyrica and understands the directions.  New rx will be sent to his pharmacy.

## 2018-02-09 ENCOUNTER — Encounter: Payer: Self-pay | Admitting: Neurology

## 2018-02-09 ENCOUNTER — Ambulatory Visit: Payer: Medicare Other | Admitting: Neurology

## 2018-02-09 VITALS — BP 124/83 | HR 72 | Ht 70.0 in | Wt 246.8 lb

## 2018-02-09 DIAGNOSIS — G2581 Restless legs syndrome: Secondary | ICD-10-CM

## 2018-02-09 NOTE — Progress Notes (Signed)
PATIENT: [name] DOB: 13-Oct-1940  REASON FOR VISIT: follow up HISTORY FROM: patient  HISTORY OF PRESENT ILLNESS: HISTORY Per Dr. [name] notes: HISTORY : [name] is a 77 yo RH male, accompanied by his wife, referred by Dr. Jacelyn Grip, and his primary care physician Dr. Leonides Schanz for evaluation of chronic low back pain, bilateral lower extremity muscle ache He had a past medical history of hypertension, hyperlipidemia, presenting with chronic low back pain, bilateral lower extremity muscle achy pain since 2010.  Initially it was thought due to his statin treatment, he has tried different statin without improving his symptoms,over the years, he also received multiple lumbar bilateral facet joint fluoroscopy guided injection without improving his symptoms.  In 2012, he was diagnosed with L4 and 5 lumbar stenosis, had lumbar laminectomy L3-4, L4-L5 by Dr. [name] in July 2012,without improving his symptoms, He was seen by different specialists, tried different medications, this including Elavil, Xanax as needed, he is currently taking hydrocodone 5/325 mg 1-2 tablets every night, Xanax 0.25 milligram as needed, Flexeril 10 mg every night, gabapentin 600 mg every night without significant improvement, During the daytime, he denies significant low back pain, or bilateral lower extremity pain, he does has history of right ankle fracture, mild gait difficulty due to right ankle pain, but starting at evening time, he began to noticed deep achy pain starting from midline low back, radiating along bilateral lateral leg, constant, he tends to pace around, has difficulty sleeping at nighttime, sometimes he has to got up take a hot bath couple times every night, put ice on his back, on his feet, only provide temporary relief. He has also tried acupuncture, massage, TENS unit, without significant improvement, previously, he has a short trial of low-dose Requip, without improving his symptoms, no significant  side effect noticed either. He has bilateral lateral 3 toes numbness, no weakness, no bilateral fingertips numbness, or weakness Per record, electrodiagnostic study failed to demonstrate significant etiology, We have reviewed MRI lumbar in 2012, there was evidence of L4-5 spinal stenosis, and the most recent MRI Lumbar was in June 2014,progression of multilevel facet arthropathy, status post a laminectomy at L3-4, with residual large synovial cyst extending along the posterior epidural space without significant central canal stenosis, mild foraminal narrowing bilaterally at L3 and 4, progression of mild lateral recess narrowing bilaterally at L4-5, foraminal narrowing is stable at this level MRI of the brain in June 2014 showed mild atrophy, no acute intracranial abnormality  UPDATE Dec 15th 2015:  He is tolerating gabapentin 300 mg 2 tablets every night, Requip 1 mg 2 tablets every night, he can sleep better with the medications, but continue complains of bilateral lower extremity dull achy pain, numbness, urgency to move, especially at nighttime, He also complains of slow progressive gait difficulty, grasps things for support, he has urinary urgency, but no incontinence.  UPDATE Jan 29th 2016: He is now taking gabapentin 300mg  2 tabs qhs and requip 1mg , 3 tabs qhs, which has been very helpful, 30%, improvement, he still has bilatarel leg pain at evening, stretching,massage, He denies bowel and bladder incontinence , he has tinlging at the soles of his feet.   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  He continue complaining significant difficulty at nighttime, came in with a  full page list of symptoms, in the morning time, he denies significant back pain, no low back pain, symptoms started in the late afternoon, gradually building up, around 9: to10 PM, he noticed achy feeling down lateral side of both leg, increased by sitting still, intense urge to lie on the floor, stretching muscle backwards, late night hot soaking bath to help him sleep, he has to take about 3 AM last night, difficulty sleeping, excessive fatigue during the daytime, could not sit through movies, standing up in the aisle 2/3 of the movie He is now taking, hydrocodone 5/325mg  2 tabs 9pm, advil 200mg  2 tab   He has quit taking Requip 1 mg, up to 4 tablets every night, also gabapentin, complains of upset stomach, without relieving his symptoms, he is now taking Advil 200 mg 2 tablets as needed, hydrocodone/Tylenol 5/325 mg 2 tablets every night for symptoms control, which only mild, temporarily, Tylenol helps some too.  UPDATE April 29th 2016:Laboratory showed low iron, ferritin level at 9, he was put on iron supplement, his restless leg symptoms has much improved, He is now taking clonazepam 0.5 milligram every night, Lyrica 50 mg every night, he can sleep much better,  He continue, combat right ankle pain  UPDATE August 2nd 2016: He sleeps well, he is now taking clonazepam 0.5mg  qhs, Lyrica 50mg  3 tabs qhs, no longer on Hydrocodone, he complains of side effect  UPDATE August 06 2016: I reviewed operation record on April 18 2016, he had right lumbar L3-4, L4-5 anterolateral lumbar interbody fusion, percutaneous pedicle screw on April 18 2016, which has helped his left-sided low back pain radiating pain to left lower extremity, 4. Of time he was almost pain-free, unfortunately he fell landed on his right side, is then he began to experience excruciating radiating pain from left side to left anterior thigh.  We have personally reviewed and compared to MRI lumbar in October 2017, and in  February 2018. Which showed pedicle screw and interbody fusion at L3-4, L4-5 without stenosis, small fluid collection posterior to the thecal sac at L3-4, moderate right foraminal narrowing, mild foraminal narrowing at L4-5.  He is planning on to have second left-sided lumbar decompression surgery on August 15 2016  His restless leg symptoms is under good control with current dose of Lyrica 100 mg twice a day, clonazepam as needed, oxycodone 5 mg every night  His most recent ferritin level was 47, hemoglobin was 11 point 5,  UPDATE August 07 2017: He is accompanied by his wife at today's clinical visit, complaining worsening restless leg symptoms, for a while, his restless leg symptoms seems to be under okay control.  Since 2017, he had multiple surgery, lumbar fusion by Dr. Birdena Crandall November 2017, redo in March 2018, left hip replacement in November 2018,  He has baseline gait abnormality, but no longer have significant pain, at nighttime, 1-2 hours after lying down, he has uncontrollable right leg muscle jerking, urged to move,  He is now taking Lyrica 100 mg 2 tablets at nighttime, clonazepam 0.5 mg every night, often have to take 1-1/2 tablets of Percocet 5/325 mg early morning time if he could not sleep his combination medication treatment.  Sometimes he has to take hot bath in the middle of the night to help him go to sleep.  UPDATE Sept 9 2019: Now he is taking generic Lyric 100mg  3 tabs qhs, Requip 0.5 mg every night,  20%  of time he is able to sleep without any difficulties, but 80% of the time about 1 to 2 hours after he goes to bed, he has severe leg discomfort, has to take a hot bath, then was able to sleep the rest of the night, sometimes he took clonazepam 0.5 mg half to 1 tablets before bedtime, complains of excessive drowsiness even to the point of wetting his bed,   His ferritin level is low 15 ng/ml March 2019, last colonoscopy was in 2017, hemoglobin in November 2018 prior to  his left hip surgery was 13, he is now on iron supplement  REVIEW OF SYSTEMS: Out of a complete 14 system review of symptoms, the patient complains only of the following symptoms, and all other reviewed systems are negative. Restless leg, insomnia  ALLERGIES: Allergies  Allergen Reactions  . Compazine [Prochlorperazine Edisylate] Other (See Comments)    EXTRAPYRAMIDAL MOVEMENT [Involuntary muscle movement]  . Penicillins Nausea And Vomiting, Rash and Other (See Comments)    Has patient had a PCN reaction causing immediate rash, facial/tongue/throat swelling, SOB or lightheadedness with hypotension: #  #  #  YES  #  #  #  Has patient had a PCN reaction causing severe rash involving mucus membranes or skin necrosis: No Has patient had a PCN reaction that required hospitalization No Has patient had a PCN reaction occurring within the last 10 years: No If all of the above answers are "NO", then may proceed with Cephalosporin use.   [name]] Other (See Comments)    MYALGIAS [Leg aches]  . Gemfibrozil Nausea Only    PATIENT TOLERATES  . Red Yeast Rice [Cholestin] Other (See Comments)    flushing    HOME MEDICATIONS: Outpatient Medications Prior to Visit  Medication Sig Dispense Refill  . aspirin 81 MG tablet Take 81 mg by mouth daily.    . carvedilol (COREG) 3.125 MG tablet Take 3.125 mg by mouth at bedtime.     . clonazePAM (KLONOPIN) 0.5 MG tablet Take 1 tablet (0.5 mg total) by mouth at bedtime as needed for anxiety. 30 tablet 5  . DULoxetine (CYMBALTA) 60 MG capsule Take 60 mg daily by mouth.     . esomeprazole (NEXIUM) 20 MG capsule Take 20 mg by mouth daily at 12 noon.    . ezetimibe (ZETIA) 10 MG tablet Take 1 tablet (10 mg total) by mouth daily. 30 tablet 6  . ferrous sulfate 324 (65 Fe) MG TBEC Take 1 tablet by mouth daily.    Marland Kitchen ibuprofen (ADVIL,MOTRIN) 200 MG tablet Take 400 mg by mouth as needed.    Marland Kitchen lisinopril (PRINIVIL,ZESTRIL) 10 MG tablet Take 10 mg by  mouth at bedtime.     . methocarbamol (ROBAXIN) 500 MG tablet Take 500 mg by mouth at bedtime as needed for muscle spasms.     . Multiple Vitamins-Minerals (MULTIVITAMIN PO) Take 1 tablet by mouth daily.    . nitroGLYCERIN (NITROSTAT) 0.4 MG SL tablet Place 0.4 mg under the tongue every 5 (five) minutes as needed for chest pain.    Marland Kitchen oxyCODONE-acetaminophen (PERCOCET/ROXICET) 5-325 MG tablet Take 1-2 tablets by mouth every 4 (four) hours as needed for moderate pain. (Patient taking differently: Take 1-2 tablets by mouth every 8 (eight) hours as needed for moderate pain. ) 40 tablet 0  . polyethylene glycol (MIRALAX / GLYCOLAX) packet Take 17 g by mouth daily as needed for mild constipation.    . pregabalin (LYRICA) 100 MG capsule Take 1  capsule (100 mg total) by mouth 3 (three) times daily. 90 capsule 5  . rOPINIRole (REQUIP) 0.5 MG tablet Take 1 tablet (0.5 mg total) by mouth at bedtime. 30 tablet 11  . rosuvastatin (CRESTOR) 10 MG tablet TAKE 1 TABLET ONCE DAILY OR AS DIRECTED. 90 tablet 2  . traMADol (ULTRAM) 50 MG tablet Take 50 mg by mouth every 8 (eight) hours as needed.    . ASPIRIN 81 PO Take 1 tablet by mouth daily.    . iron polysaccharides (NIFEREX) 150 MG capsule Take 1 capsule (150 mg total) by mouth daily. 30 capsule 11  . oxyCODONE-acetaminophen (PERCOCET) 5-325 MG tablet Take 1 tablet by mouth at bedtime. 30 tablet 0   No facility-administered medications prior to visit.     PAST MEDICAL HISTORY: Past Medical History:  Diagnosis Date  . Anginal pain (HCC)    NONE IN 3 YEARS  . Arthritis   . Atrial fibrillation ([place]) 1962   a. No recurrence since 1962.  . Chest pain    a. 1998 Cath: nl cors;  b. 2009 Cath: nl cors.  . Chronic kidney disease    stage 3  . Depression   . Dysrhythmia    NO TROUBLE IN 1 YR    DR. PETER [place]   . Esophageal reflux   . Essential hypertension   . History of Paroxysmal atrial flutter ([place])   . Hyperlipidemia   . Incontinence of urine   .  Leg pain, bilateral   . Nocturia   . PONV (postoperative nausea and vomiting)    CONSTIPATED  . Scarlet fever 1945  . Spinal stenosis   . Staph skin infection    LEFT GROIN  04/11/16  TX W/ DOXYCYCLINE  . Ulcerative proctitis ([place])     PAST SURGICAL HISTORY: Past Surgical History:  Procedure Laterality Date  . ANKLE RECONSTRUCTION Right 2007   [place]  . ANTERIOR LAT LUMBAR FUSION Right 04/18/2016   Procedure: RIGHT LUMBAR THREE-FOUR, LUMBAR FOUR-FIVE ANTEROLATERAL LUMBAR INTERBODY FUSION;  Surgeon: Erline Levine, MD;  Location: [facility];  Service: Neurosurgery;  Laterality: Right;  RIGHT L3-4 L4-5 ANTEROLATERAL LUMBAR INTERBODY FUSION  . CARDIAC CATHETERIZATION  03/03/2008   Left heart cardiac catheterization and coronary  . CARDIAC CATHETERIZATION  1997   Dr Wynonia Lawman  . CATARACT EXTRACTION W/ INTRAOCULAR LENS  IMPLANT, BILATERAL     2016  . ESOPHAGEAL DILATION     2008  . INNER EAR SURGERY     EAR INJECTION FOR MENIERES  . KNEE ARTHROSCOPY  1991, 1995  . LUMBAR LAMINECTOMY  12/2010  . LUMBAR LAMINECTOMY/DECOMPRESSION MICRODISCECTOMY Left 08/15/2016   Procedure: Left Left three- four Redo laminectomy;  Surgeon: Erline Levine, MD;  Location: [facility];  Service: Neurosurgery;  Laterality: Left;  Left L3-4 Redo laminectomy  . LUMBAR PERCUTANEOUS PEDICLE SCREW 2 LEVEL N/A 04/18/2016   Procedure: LUMBAR THREE-FOUR, LUMBAR FOUR-FIVE PERCUTANEOUS PEDICLE SCREW;  Surgeon: Erline Levine, MD;  Location: [facility];  Service: Neurosurgery;  Laterality: N/A;  . 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 TOTAL HIP ARTHROPLASTY Left 04/22/2017   Procedure: TOTAL HIP ARTHROPLASTY ANTERIOR APPROACH;  Surgeon: Melrose Nakayama, MD;  Location: [facility];  Service: Orthopedics;  Laterality: Left;    FAMILY HISTORY: Family History  Problem Relation Age of Onset  . Prostate cancer Father   . Heart attack Brother   . Kidney cancer Brother   . Brain cancer Brother     SOCIAL HISTORY: Social History    Socioeconomic History  .  Marital status: Married    Spouse name: Bethena Roys  . Number of children: 3  . Years of education: college  . Highest education level: Not on file  Occupational History    Comment: Retired   Scientific laboratory technician  . Financial resource strain: Not on file  . Food insecurity:    Worry: Not on file    Inability: Not on file  . Transportation needs:    Medical: Not on file    Non-medical: Not on file  Tobacco Use  . Smoking status: Current Some Day Smoker    Types: Pipe  . Smokeless tobacco: Never Used  Substance and Sexual Activity  . Alcohol use: Yes    Alcohol/week: 1.0 standard drinks    Types: 1 Shots of liquor per week    Comment: 1-3 weekly  . Drug use: No  . Sexual activity: Not on file  Lifestyle  . Physical activity:    Days per week: Not on file    Minutes per session: Not on file  . Stress: Not on file  Relationships  . Social connections:    Talks on phone: Not on file    Gets together: Not on file    Attends religious service: Not on file    Active member of club or organization: Not on file    Attends meetings of clubs or organizations: Not on file    Relationship status: Not on file  . Intimate partner violence:    Fear of current or ex partner: Not on file    Emotionally abused: Not on file    Physically abused: Not on file    Forced sexual activity: Not on file  Other Topics Concern  . Not on file  Social History Narrative   Patient is retired and lives at home with his wife Bethena Roys.    Education college.   Caffeine one cup daily.      PHYSICAL EXAM  Vitals:   02/09/18 1450  BP: 124/83  Pulse: 72  Weight: 246 lb 12 oz (111.9 kg)  Height: 5\' 10"  (1.778 m)   Body mass index is 35.4 kg/m.  Generalized: Well developed, in no acute distress   Neurological examination  Mentation: Alert oriented to time, place, history taking. Follows all commands speech and language fluent Cranial nerve II-XII: Pupils were equal round reactive  to light. Extraocular movements were full, visual field were full on confrontational test. Facial sensation and strength were normal. Uvula tongue midline. Head turning and shoulder shrug  were normal and symmetric. Motor: The motor testing reveals 5 over 5 strength of all 4 extremities. Good symmetric motor tone is noted throughout.  Sensory: Sensory testing is intact to soft touch on all 4 extremities. No evidence of extinction is noted.  Coordination: Cerebellar testing reveals good finger-nose-finger and heel-to-shin bilaterally.  Gait and station: He needs pushed up to get up from seated position, antalgic, cautious Reflexes: Deep tendon reflexes are symmetric and normal bilaterally.   DIAGNOSTIC DATA (LABS, IMAGING, TESTING) - I reviewed patient records, labs, notes, testing and imaging myself where available.  Lab Results  Component Value Date   WBC 5.5 04/14/2017   HGB 13.1 04/14/2017   HCT 40.6 04/14/2017   MCV 96.0 04/14/2017   PLT 184 04/14/2017      Component Value Date/Time   NA 139 04/14/2017 1000   NA 138 04/29/2016   K 4.8 04/14/2017 1000   CL 105 04/14/2017 1000   CO2 25 04/14/2017 1000  GLUCOSE 93 04/14/2017 1000   BUN 29 (H) 04/14/2017 1000   BUN 19 04/29/2016   CREATININE 1.30 (H) 04/14/2017 1000   CALCIUM 8.8 (L) 04/14/2017 1000   PROT 6.3 01/13/2017 0846   ALBUMIN 4.4 01/13/2017 0846   AST 26 01/13/2017 0846   ALT 20 01/13/2017 0846   ALKPHOS 79 01/13/2017 0846   BILITOT 0.4 01/13/2017 0846   GFRNONAA 51 (L) 04/14/2017 1000   GFRAA 59 (L) 04/14/2017 1000   Lab Results  Component Value Date   CHOL 90 (L) 01/13/2017   HDL 29 (L) 01/13/2017   LDLCALC 33 01/13/2017   TRIG 138 01/13/2017   CHOLHDL 3.8 01/18/2016      ASSESSMENT AND PLAN 77 y.o. year old male   Restless leg symptoms  Ferritin level was low 15, normal iron supplement, repeat iron 11,  Continue Lyrica 100 mg 3 tablets at bedtime, Cymbalta 60 mg every morning,  Keep Requip 0.5 mg  at bedtime,  Clonazepam 0.5mg  1/2 to 1 tablets every night as needed, instead of hot tub for his restless leg  Face to face time was 25 minutes, greater than 50% of the time was spent in counseling and coordination of care with the patient.    Marcial Pacas, M.D. Ph.D.  [facility] [place], [place] [address] Phone: [phone] Fax:      [phone]

## 2018-02-10 LAB — CBC
Hematocrit: 43.9 % (ref 37.5–51.0)
Hemoglobin: 14.9 g/dL (ref 13.0–17.7)
MCH: 33 pg (ref 26.6–33.0)
MCHC: 33.9 g/dL (ref 31.5–35.7)
MCV: 97 fL (ref 79–97)
PLATELETS: 158 10*3/uL (ref 150–450)
RBC: 4.52 x10E6/uL (ref 4.14–5.80)
RDW: 13.1 % (ref 12.3–15.4)
WBC: 6.4 10*3/uL (ref 3.4–10.8)

## 2018-02-10 LAB — FERRITIN: Ferritin: 49 ng/mL (ref 30–400)

## 2018-05-04 ENCOUNTER — Telehealth: Payer: Self-pay | Admitting: *Deleted

## 2018-05-04 NOTE — Telephone Encounter (Signed)
Note opened in error.

## 2018-07-07 ENCOUNTER — Other Ambulatory Visit: Payer: Self-pay

## 2018-07-07 ENCOUNTER — Encounter: Payer: Self-pay | Admitting: Cardiology

## 2018-07-07 DIAGNOSIS — I4819 Other persistent atrial fibrillation: Secondary | ICD-10-CM | POA: Insufficient documentation

## 2018-07-07 DIAGNOSIS — I4891 Unspecified atrial fibrillation: Secondary | ICD-10-CM

## 2018-07-07 NOTE — Progress Notes (Signed)
Echo

## 2018-07-09 ENCOUNTER — Ambulatory Visit (HOSPITAL_COMMUNITY): Payer: Medicare Other | Attending: Cardiovascular Disease

## 2018-07-09 DIAGNOSIS — I4891 Unspecified atrial fibrillation: Secondary | ICD-10-CM

## 2018-07-10 NOTE — Progress Notes (Signed)
[name] Date of Birth: 1940/06/25 Medical Record #[id number]  History of Present Illness: [name] is seen for evaluation of new onset Afib. He has a history of mixed hyperlipidemia, HTN, and obesity.  He has been intolerant to statins due to severe myalgias. This includes lipitor at a dose of 20 mg daily and Crestor 10 mg 3 days a week. Welchol apparently made no change in his lipid levels. Niacin was associated with severe flushing.   He has no history of vascular disease. He had normal cardiac caths in 1998 and 2009. No history of PAD or CVA/TIA.  He apparently had afib in 1962 but no documented recurrence afterwards.   He was seen recently by Dr Addison Lank for routine physical and found to be in Afib with controlled rate. Not symptomatic. Started on anticoagulation with Eliquis. Echo showed Normal LV and valvular function. Moderate LAE.  He states that he is completely unaware of Afib. No dyspnea, palpitations, dizziness, or chest pain. No edema. He has had a couple of falls related to instability of his knee. He has been receiving gel injections in his right knee and may eventually need knee replacement.   Current Outpatient Medications on File Prior to Visit  Medication Sig Dispense Refill  . carvedilol (COREG) 3.125 MG tablet Take 3.125 mg by mouth at bedtime.     . clonazePAM (KLONOPIN) 0.5 MG tablet Take 1 tablet (0.5 mg total) by mouth at bedtime as needed for anxiety. 30 tablet 5  . DULoxetine (CYMBALTA) 60 MG capsule Take 60 mg daily by mouth.     Arne Cleveland 5 MG TABS tablet Take 5 mg by mouth 2 (two) times daily.    Marland Kitchen esomeprazole (NEXIUM) 20 MG capsule Take 20 mg by mouth daily at 12 noon.    . ezetimibe (ZETIA) 10 MG tablet Take 1 tablet (10 mg total) by mouth daily. 30 tablet 6  . ferrous sulfate 324 (65 Fe) MG TBEC Take 1 tablet by mouth daily.    Marland Kitchen lisinopril (PRINIVIL,ZESTRIL) 10 MG tablet Take 10 mg by mouth at bedtime.     . methocarbamol (ROBAXIN) 500 MG tablet Take  500 mg by mouth at bedtime as needed for muscle spasms.     . Multiple Vitamins-Minerals (MULTIVITAMIN PO) Take 1 tablet by mouth daily.    . polyethylene glycol (MIRALAX / GLYCOLAX) packet Take 17 g by mouth daily as needed for mild constipation.    . pregabalin (LYRICA) 100 MG capsule Take 1 capsule (100 mg total) by mouth 3 (three) times daily. 90 capsule 5  . rOPINIRole (REQUIP) 0.5 MG tablet Take 1 tablet (0.5 mg total) by mouth at bedtime. 30 tablet 11  . rosuvastatin (CRESTOR) 10 MG tablet TAKE 1 TABLET ONCE DAILY OR AS DIRECTED. 90 tablet 2  . nitroGLYCERIN (NITROSTAT) 0.4 MG SL tablet Place 0.4 mg under the tongue every 5 (five) minutes as needed for chest pain.     No current facility-administered medications on file prior to visit.     Allergies  Allergen Reactions  . Compazine [Prochlorperazine Edisylate] Other (See Comments)    EXTRAPYRAMIDAL MOVEMENT [Involuntary muscle movement]  . Penicillins Nausea And Vomiting, Rash and Other (See Comments)    Has patient had a PCN reaction causing immediate rash, facial/tongue/throat swelling, SOB or lightheadedness with hypotension: #  #  #  YES  #  #  #  Has patient had a PCN reaction causing severe rash involving mucus membranes or skin necrosis: No  Has patient had a PCN reaction that required hospitalization No Has patient had a PCN reaction occurring within the last 10 years: No If all of the above answers are "NO", then may proceed with Cephalosporin use.   [name]] Other (See Comments)    MYALGIAS [Leg aches]  . Gemfibrozil Nausea Only    PATIENT TOLERATES  . Red Yeast Rice [Cholestin] Other (See Comments)    flushing    Past Medical History:  Diagnosis Date  . Anginal pain (HCC)    NONE IN 3 YEARS  . Arthritis   . Atrial fibrillation ([place]) 1962   a. No recurrence since 1962.  . Chest pain    a. 1998 Cath: nl cors;  b. 2009 Cath: nl cors.  . Chronic kidney disease    stage 3  . Depression   .  Dysrhythmia    NO TROUBLE IN 1 YR    DR. Derrin Currey [place]   . Esophageal reflux   . Essential hypertension   . History of Paroxysmal atrial flutter ([place])   . Hyperlipidemia   . Incontinence of urine   . Leg pain, bilateral   . Nocturia   . PONV (postoperative nausea and vomiting)    CONSTIPATED  . Scarlet fever 1945  . Spinal stenosis   . Staph skin infection    LEFT GROIN  04/11/16  TX W/ DOXYCYCLINE  . Ulcerative proctitis [facility])     Past Surgical History:  Procedure Laterality Date  . ANKLE RECONSTRUCTION Right 2007   [place]  . ANTERIOR LAT LUMBAR FUSION Right 04/18/2016   Procedure: RIGHT LUMBAR THREE-FOUR, LUMBAR FOUR-FIVE ANTEROLATERAL LUMBAR INTERBODY FUSION;  Surgeon: Erline Levine, MD;  Location: [place];  Service: Neurosurgery;  Laterality: Right;  RIGHT L3-4 L4-5 ANTEROLATERAL LUMBAR INTERBODY FUSION  . CARDIAC CATHETERIZATION  03/03/2008   Left heart cardiac catheterization and coronary  . CARDIAC CATHETERIZATION  1997   Dr Wynonia Lawman  . CATARACT EXTRACTION W/ INTRAOCULAR LENS  IMPLANT, BILATERAL     2016  . ESOPHAGEAL DILATION     2008  . INNER EAR SURGERY     EAR INJECTION FOR MENIERES  . KNEE ARTHROSCOPY  1991, 1995  . LUMBAR LAMINECTOMY  12/2010  . LUMBAR LAMINECTOMY/DECOMPRESSION MICRODISCECTOMY Left 08/15/2016   Procedure: Left Left three- four Redo laminectomy;  Surgeon: Erline Levine, MD;  Location: [place];  Service: Neurosurgery;  Laterality: Left;  Left L3-4 Redo laminectomy  . LUMBAR PERCUTANEOUS PEDICLE SCREW 2 LEVEL N/A 04/18/2016   Procedure: LUMBAR THREE-FOUR, LUMBAR FOUR-FIVE PERCUTANEOUS PEDICLE SCREW;  Surgeon: Erline Levine, MD;  Location: [place];  Service: Neurosurgery;  Laterality: N/A;  . 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 TOTAL HIP ARTHROPLASTY Left 04/22/2017   Procedure: TOTAL HIP ARTHROPLASTY ANTERIOR APPROACH;  Surgeon: Melrose Nakayama, MD;  Location: [place];  Service: Orthopedics;  Laterality: Left;    Social History   Tobacco Use  Smoking  Status Current Some Day Smoker  . Types: Pipe  Smokeless Tobacco Never Used    Social History   Substance and Sexual Activity  Alcohol Use Yes  . Alcohol/week: 1.0 standard drinks  . Types: 1 Shots of liquor per week   Comment: 1-3 weekly    Family History  Problem Relation Age of Onset  . Prostate cancer Father   . Heart attack Brother   . Kidney cancer Brother   . Brain cancer Brother     Review of Systems: As noted in HPI.  All other systems  were reviewed and are negative.  Physical Exam: BP 110/72   Pulse 80   Ht 5\' 10"  (1.778 m)   Wt 250 lb 9.6 oz (113.7 kg)   BMI 35.96 kg/m  GENERAL:  Well appearing WM in NAD. Walks with a cane HEENT:  PERRL, EOMI, sclera are clear. Oropharynx is clear. NECK:  No jugular venous distention, carotid upstroke brisk and symmetric, no bruits, no thyromegaly or adenopathy LUNGS:  Clear to auscultation bilaterally CHEST:  Unremarkable HEART:  IRRR,  PMI not displaced or sustained,S1 and S2 within normal limits, no S3, no S4: no clicks, no rubs, no murmurs ABD:  Soft, nontender. BS +, no masses or bruits. No hepatomegaly, no splenomegaly EXT:  2 + pulses throughout, no edema, no cyanosis no clubbing SKIN:  Warm and dry.  No rashes NEURO:  Alert and oriented x 3. Cranial nerves II through XII intact. PSYCH:  Cognitively intact      LABORATORY DATA: Lab Results  Component Value Date   WBC 6.4 02/09/2018   HGB 14.9 02/09/2018   HCT 43.9 02/09/2018   PLT 158 02/09/2018   GLUCOSE 93 04/14/2017   CHOL 90 (L) 01/13/2017   TRIG 138 01/13/2017   HDL 29 (L) 01/13/2017   LDLCALC 33 01/13/2017   ALT 20 01/13/2017   AST 26 01/13/2017   NA 139 04/14/2017   K 4.8 04/14/2017   CL 105 04/14/2017   CREATININE 1.30 (H) 04/14/2017   BUN 29 (H) 04/14/2017   CO2 25 04/14/2017   TSH 1.640 08/07/2017   INR 0.96 04/14/2017   Labs dated 12/26/16: A1c 5.8%, Hgb 14.2, creatinine 1.21. TSH normal.  Dated 07/07/18: A1c 5.6%. creatinine 1.34.  potassium 5.5. TSH normal. CBC normal.  Ecg today shows Afib  rate 80 with occ aberrant beat. LAD. ? Old inferior infarct. I have personally reviewed and interpreted this study.  Echo 07/09/18: IMPRESSIONS    1. The left ventricle has normal systolic function of 49-44%. The cavity size was normal. There is no increased left ventricular wall thickness. Left ventricular diastology could not be evaluated secondary to atrial fibrillation.  2. The right ventricle has normal systolic function. The cavity was mildly enlarged. There is no increase in right ventricular wall thickness.  3. Left atrial size was moderately dilated.  4. The mitral valve is normal in structure.  5. The tricuspid valve is normal in structure.  6. The aortic valve is normal in structure. Aortic valve regurgitation is mild by color flow Doppler.  7. The pulmonic valve was normal in structure.  8. There is mild dilatation of the ascending aorta.  FINDINGS  Left Ventricle: The left ventricle has normal systolic function of 96-75%. The cavity size was normal. There is no increased left ventricular wall thickness. Left ventricular diastology could not be evaluated secondary to atrial fibrillation. Right Ventricle: The right ventricle has normal systolic function. The cavity was mildly enlarged. There is no increase in right ventricular wall thickness. Left Atrium: left atrial size was moderately dilated Right Atrium: right atrial size was normal in size Interatrial Septum: No atrial level shunt detected by color flow Doppler.  Assessment / Plan: 1. Atrial fibrillation- new onset. He is completely asymptomatic. Rate controlled on low dose Coreg. Now on Eliquis for anticoagulation. [place] Vasc of 2. Recommend he stop taking ASA and minimize use of NSAIDs. Given lack of symptoms will continue with a rate control strategy.   2. Mixed hyperlipidemia. Now on Crestor and Zetia and tolerating well.  Excellent control.   3. HTN-  controlled. Continue  lisinopril.   4. Normal cardiac cath 2009.  I will follow up in 6 months.

## 2018-07-14 ENCOUNTER — Ambulatory Visit: Payer: Medicare Other | Admitting: Cardiology

## 2018-07-14 ENCOUNTER — Encounter: Payer: Self-pay | Admitting: Cardiology

## 2018-07-14 VITALS — BP 110/72 | HR 80 | Ht 70.0 in | Wt 250.6 lb

## 2018-07-14 DIAGNOSIS — E782 Mixed hyperlipidemia: Secondary | ICD-10-CM | POA: Diagnosis not present

## 2018-07-14 DIAGNOSIS — I4819 Other persistent atrial fibrillation: Secondary | ICD-10-CM

## 2018-07-14 DIAGNOSIS — I10 Essential (primary) hypertension: Secondary | ICD-10-CM | POA: Diagnosis not present

## 2018-07-14 NOTE — Patient Instructions (Signed)
Stop ASA and minimize NSAID use

## 2018-07-31 ENCOUNTER — Other Ambulatory Visit: Payer: Self-pay | Admitting: Neurology

## 2018-08-10 ENCOUNTER — Ambulatory Visit: Payer: Medicare Other | Admitting: Neurology

## 2018-08-10 ENCOUNTER — Encounter: Payer: Self-pay | Admitting: Neurology

## 2018-08-10 VITALS — BP 116/82 | HR 78 | Ht 70.0 in | Wt 250.2 lb

## 2018-08-10 DIAGNOSIS — G2581 Restless legs syndrome: Secondary | ICD-10-CM

## 2018-08-10 DIAGNOSIS — R202 Paresthesia of skin: Secondary | ICD-10-CM | POA: Diagnosis not present

## 2018-08-10 NOTE — Progress Notes (Signed)
PATIENT: [name] DOB: 13-Oct-1940  REASON FOR VISIT: follow up HISTORY FROM: patient  HISTORY OF PRESENT ILLNESS: HISTORY Per Dr. [name] notes: HISTORY : [name] is a 78 yo RH male, accompanied by his wife, referred by Dr. Jacelyn Grip, and his primary care physician Dr. Leonides Schanz for evaluation of chronic low back pain, bilateral lower extremity muscle ache He had a past medical history of hypertension, hyperlipidemia, presenting with chronic low back pain, bilateral lower extremity muscle achy pain since 2010.  Initially it was thought due to his statin treatment, he has tried different statin without improving his symptoms,over the years, he also received multiple lumbar bilateral facet joint fluoroscopy guided injection without improving his symptoms.  In 2012, he was diagnosed with L4 and 5 lumbar stenosis, had lumbar laminectomy L3-4, L4-L5 by Dr. [name] in July 2012,without improving his symptoms, He was seen by different specialists, tried different medications, this including Elavil, Xanax as needed, he is currently taking hydrocodone 5/325 mg 1-2 tablets every night, Xanax 0.25 milligram as needed, Flexeril 10 mg every night, gabapentin 600 mg every night without significant improvement, During the daytime, he denies significant low back pain, or bilateral lower extremity pain, he does has history of right ankle fracture, mild gait difficulty due to right ankle pain, but starting at evening time, he began to noticed deep achy pain starting from midline low back, radiating along bilateral lateral leg, constant, he tends to pace around, has difficulty sleeping at nighttime, sometimes he has to got up take a hot bath couple times every night, put ice on his back, on his feet, only provide temporary relief. He has also tried acupuncture, massage, TENS unit, without significant improvement, previously, he has a short trial of low-dose Requip, without improving his symptoms, no significant  side effect noticed either. He has bilateral lateral 3 toes numbness, no weakness, no bilateral fingertips numbness, or weakness Per record, electrodiagnostic study failed to demonstrate significant etiology, We have reviewed MRI lumbar in 2012, there was evidence of L4-5 spinal stenosis, and the most recent MRI Lumbar was in June 2014,progression of multilevel facet arthropathy, status post a laminectomy at L3-4, with residual large synovial cyst extending along the posterior epidural space without significant central canal stenosis, mild foraminal narrowing bilaterally at L3 and 4, progression of mild lateral recess narrowing bilaterally at L4-5, foraminal narrowing is stable at this level MRI of the brain in June 2014 showed mild atrophy, no acute intracranial abnormality  UPDATE Dec 15th 2015:  He is tolerating gabapentin 300 mg 2 tablets every night, Requip 1 mg 2 tablets every night, he can sleep better with the medications, but continue complains of bilateral lower extremity dull achy pain, numbness, urgency to move, especially at nighttime, He also complains of slow progressive gait difficulty, grasps things for support, he has urinary urgency, but no incontinence.  UPDATE Jan 29th 2016: He is now taking gabapentin 300mg  2 tabs qhs and requip 1mg , 3 tabs qhs, which has been very helpful, 30%, improvement, he still has bilatarel leg pain at evening, stretching,massage, He denies bowel and bladder incontinence , he has tinlging at the soles of his feet.   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  He continue complaining significant difficulty at nighttime, came in with a  full page list of symptoms, in the morning time, he denies significant back pain, no low back pain, symptoms started in the late afternoon, gradually building up, around 9: to10 PM, he noticed achy feeling down lateral side of both leg, increased by sitting still, intense urge to lie on the floor, stretching muscle backwards, late night hot soaking bath to help him sleep, he has to take about 3 AM last night, difficulty sleeping, excessive fatigue during the daytime, could not sit through movies, standing up in the aisle 2/3 of the movie He is now taking, hydrocodone 5/325mg  2 tabs 9pm, advil 200mg  2 tab   He has quit taking Requip 1 mg, up to 4 tablets every night, also gabapentin, complains of upset stomach, without relieving his symptoms, he is now taking Advil 200 mg 2 tablets as needed, hydrocodone/Tylenol 5/325 mg 2 tablets every night for symptoms control, which only mild, temporarily, Tylenol helps some too.  UPDATE April 29th 2016:Laboratory showed low iron, ferritin level at 9, he was put on iron supplement, his restless leg symptoms has much improved, He is now taking clonazepam 0.5 milligram every night, Lyrica 50 mg every night, he can sleep much better,  He continue, combat right ankle pain  UPDATE August 2nd 2016: He sleeps well, he is now taking clonazepam 0.5mg  qhs, Lyrica 50mg  3 tabs qhs, no longer on Hydrocodone, he complains of side effect  UPDATE August 06 2016: I reviewed operation record on April 18 2016, he had right lumbar L3-4, L4-5 anterolateral lumbar interbody fusion, percutaneous pedicle screw on April 18 2016, which has helped his left-sided low back pain radiating pain to left lower extremity, 4. Of time he was almost pain-free, unfortunately he fell landed on his right side, is then he began to experience excruciating radiating pain from left side to left anterior thigh.  We have personally reviewed and compared to MRI lumbar in October 2017, and in  February 2018. Which showed pedicle screw and interbody fusion at L3-4, L4-5 without stenosis, small fluid collection posterior to the thecal sac at L3-4, moderate right foraminal narrowing, mild foraminal narrowing at L4-5.  He is planning on to have second left-sided lumbar decompression surgery on August 15 2016  His restless leg symptoms is under good control with current dose of Lyrica 100 mg twice a day, clonazepam as needed, oxycodone 5 mg every night  His most recent ferritin level was 47, hemoglobin was 11 point 5,  UPDATE August 07 2017: He is accompanied by his wife at today's clinical visit, complaining worsening restless leg symptoms, for a while, his restless leg symptoms seems to be under okay control.  Since 2017, he had multiple surgery, lumbar fusion by Dr. Birdena Crandall November 2017, redo in March 2018, left hip replacement in November 2018,  He has baseline gait abnormality, but no longer have significant pain, at nighttime, 1-2 hours after lying down, he has uncontrollable right leg muscle jerking, urged to move,  He is now taking Lyrica 100 mg 2 tablets at nighttime, clonazepam 0.5 mg every night, often have to take 1-1/2 tablets of Percocet 5/325 mg early morning time if he could not sleep his combination medication treatment.  Sometimes he has to take hot bath in the middle of the night to help him go to sleep.  UPDATE Sept 9 2019: Now he is taking generic Lyric 100mg  3 tabs qhs, Requip 0.5 mg every night,  20%  of time he is able to sleep without any difficulties, but 80% of the time about 1 to 2 hours after he goes to bed, he has severe leg discomfort, has to take a hot bath, then was able to sleep the rest of the night, sometimes he took clonazepam 0.5 mg half to 1 tablets before bedtime, complains of excessive drowsiness even to the point of wetting his bed,   His ferritin level is low 15 ng/ml March 2019, last colonoscopy was in 2017, hemoglobin in November 2018 prior to  his left hip surgery was 13, he is now on iron supplement  UPDATE August 10 2018: Ferritin in Sept 2019 was 49.  He continues to have restless leg symptoms, halftime in a week, he has trouble sleeping due to restless leg symptoms, to get up to take a hot shower, which usually is very helpful, is taking Lyrica 100 in the morning, 200 mg at 2 hours prior to sleep, also take iron supplement,  REVIEW OF SYSTEMS: Out of a complete 14 system review of symptoms, the patient complains only of the following symptoms, and all other reviewed systems are negative. Restless leg, insomnia frequent urination, achy muscles, walking difficulty, ringing in ears  ALLERGIES: Allergies  Allergen Reactions  . Compazine [Prochlorperazine Edisylate] Other (See Comments)    EXTRAPYRAMIDAL MOVEMENT [Involuntary muscle movement]  . Penicillins Nausea And Vomiting, Rash and Other (See Comments)    Has patient had a PCN reaction causing immediate rash, facial/tongue/throat swelling, SOB or lightheadedness with hypotension: #  #  #  YES  #  #  #  Has patient had a PCN reaction causing severe rash involving mucus membranes or skin necrosis: No Has patient had a PCN reaction that required hospitalization No Has patient had a PCN reaction occurring within the last 10 years: No If all of the above answers are "NO", then may proceed with Cephalosporin use.   [name]] Other (See Comments)    MYALGIAS [Leg aches]  . Gemfibrozil Nausea Only    PATIENT TOLERATES  . Red Yeast Rice [Cholestin] Other (See Comments)    flushing    HOME MEDICATIONS: Outpatient Medications Prior to Visit  Medication Sig Dispense Refill  . carvedilol (COREG) 3.125 MG tablet Take 3.125 mg by mouth at bedtime.     . clonazePAM (KLONOPIN) 0.5 MG tablet Take 1 tablet (0.5 mg total) by mouth at bedtime as needed for anxiety. 30 tablet 5  . DULoxetine (CYMBALTA) 60 MG capsule Take 60 mg daily by mouth.     Arne Cleveland 5 MG TABS tablet  Take 5 mg by mouth 2 (two) times daily.    Marland Kitchen esomeprazole (NEXIUM) 20 MG capsule Take 20 mg by mouth daily at 12 noon.    . ezetimibe (ZETIA) 10 MG tablet Take 1 tablet (10 mg total) by mouth daily. 30 tablet 6  . ferrous sulfate 324 (65 Fe) MG TBEC Take 1 tablet by mouth daily.    Marland Kitchen lisinopril (PRINIVIL,ZESTRIL) 10 MG tablet Take 10 mg by mouth at bedtime.     . methocarbamol (ROBAXIN) 500 MG tablet Take 500 mg by mouth at bedtime as needed for muscle spasms.     . Multiple Vitamins-Minerals (MULTIVITAMIN PO) Take 1 tablet by mouth daily.    . nitroGLYCERIN (NITROSTAT) 0.4 MG SL tablet Place 0.4 mg under the tongue every 5 (five) minutes as needed for chest pain.    . polyethylene glycol (MIRALAX / GLYCOLAX) packet Take 17 g  by mouth daily as needed for mild constipation.    . pregabalin (LYRICA) 100 MG capsule TAKE (1) CAPSULE THREE TIMES DAILY. 90 capsule 5  . rOPINIRole (REQUIP) 0.5 MG tablet Take 1 tablet (0.5 mg total) by mouth at bedtime. 30 tablet 11  . rosuvastatin (CRESTOR) 10 MG tablet TAKE 1 TABLET ONCE DAILY OR AS DIRECTED. 90 tablet 2   No facility-administered medications prior to visit.     PAST MEDICAL HISTORY: Past Medical History:  Diagnosis Date  . Anginal pain (HCC)    NONE IN 3 YEARS  . Arthritis   . Atrial fibrillation ([place]) 1962   a. No recurrence since 1962.  . Chest pain    a. 1998 Cath: nl cors;  b. 2009 Cath: nl cors.  . Chronic kidney disease    stage 3  . Depression   . Dysrhythmia    NO TROUBLE IN 1 YR    DR. PETER [place]   . Esophageal reflux   . Essential hypertension   . History of Paroxysmal atrial flutter ([place])   . Hyperlipidemia   . Incontinence of urine   . Leg pain, bilateral   . Nocturia   . PONV (postoperative nausea and vomiting)    CONSTIPATED  . Scarlet fever 1945  . Spinal stenosis   . Staph skin infection    LEFT GROIN  04/11/16  TX W/ DOXYCYCLINE  . Ulcerative proctitis ([place])     PAST SURGICAL HISTORY: Past Surgical  History:  Procedure Laterality Date  . ANKLE RECONSTRUCTION Right 2007   [place]  . ANTERIOR LAT LUMBAR FUSION Right 04/18/2016   Procedure: RIGHT LUMBAR THREE-FOUR, LUMBAR FOUR-FIVE ANTEROLATERAL LUMBAR INTERBODY FUSION;  Surgeon: Erline Levine, MD;  Location: [facility];  Service: Neurosurgery;  Laterality: Right;  RIGHT L3-4 L4-5 ANTEROLATERAL LUMBAR INTERBODY FUSION  . CARDIAC CATHETERIZATION  03/03/2008   Left heart cardiac catheterization and coronary  . CARDIAC CATHETERIZATION  1997   Dr Wynonia Lawman  . CATARACT EXTRACTION W/ INTRAOCULAR LENS  IMPLANT, BILATERAL     2016  . ESOPHAGEAL DILATION     2008  . INNER EAR SURGERY     EAR INJECTION FOR MENIERES  . KNEE ARTHROSCOPY  1991, 1995  . LUMBAR LAMINECTOMY  12/2010  . LUMBAR LAMINECTOMY/DECOMPRESSION MICRODISCECTOMY Left 08/15/2016   Procedure: Left Left three- four Redo laminectomy;  Surgeon: Erline Levine, MD;  Location: [facility];  Service: Neurosurgery;  Laterality: Left;  Left L3-4 Redo laminectomy  . LUMBAR PERCUTANEOUS PEDICLE SCREW 2 LEVEL N/A 04/18/2016   Procedure: LUMBAR THREE-FOUR, LUMBAR FOUR-FIVE PERCUTANEOUS PEDICLE SCREW;  Surgeon: Erline Levine, MD;  Location: [facility];  Service: Neurosurgery;  Laterality: N/A;  . 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 TOTAL HIP ARTHROPLASTY Left 04/22/2017   Procedure: TOTAL HIP ARTHROPLASTY ANTERIOR APPROACH;  Surgeon: Melrose Nakayama, MD;  Location: [facility];  Service: Orthopedics;  Laterality: Left;    FAMILY HISTORY: Family History  Problem Relation Age of Onset  . Prostate cancer Father   . Heart attack Brother   . Kidney cancer Brother   . Brain cancer Brother     SOCIAL HISTORY: Social History   Socioeconomic History  . Marital status: Married    Spouse name: Bethena Roys  . Number of children: 3  . Years of education: college  . Highest education level: Not on file  Occupational History    Comment: Retired   Scientific laboratory technician  . Financial resource strain: Not on file  . Food insecurity:  Worry: Not on file    Inability: Not on file  . Transportation needs:    Medical: Not on file    Non-medical: Not on file  Tobacco Use  . Smoking status: Current Some Day Smoker    Types: Pipe  . Smokeless tobacco: Never Used  Substance and Sexual Activity  . Alcohol use: Yes    Alcohol/week: 1.0 standard drinks    Types: 1 Shots of liquor per week    Comment: 1-3 weekly  . Drug use: No  . Sexual activity: Not on file  Lifestyle  . Physical activity:    Days per week: Not on file    Minutes per session: Not on file  . Stress: Not on file  Relationships  . Social connections:    Talks on phone: Not on file    Gets together: Not on file    Attends religious service: Not on file    Active member of club or organization: Not on file    Attends meetings of clubs or organizations: Not on file    Relationship status: Not on file  . Intimate partner violence:    Fear of current or ex partner: Not on file    Emotionally abused: Not on file    Physically abused: Not on file    Forced sexual activity: Not on file  Other Topics Concern  . Not on file  Social History Narrative   Patient is retired and lives at home with his wife Bethena Roys.    Education college.   Caffeine one cup daily.      PHYSICAL EXAM  Vitals:   08/10/18 1528  BP: 116/82  Pulse: 78  Weight: 250 lb 4 oz (113.5 kg)  Height: 5\' 10"  (1.778 m)   Body mass index is 35.91 kg/m.  Generalized: Well developed, in no acute distress   Neurological examination  Mentation: Alert oriented to time, place, history taking. Follows all commands speech and language fluent Cranial nerve II-XII: Pupils were equal round reactive to light. Extraocular movements were full, visual field were full on confrontational test. Facial sensation and strength were normal. Uvula tongue midline. Head turning and shoulder shrug  were normal and symmetric. Motor: The motor testing reveals 5 over 5 strength of all 4 extremities. Good  symmetric motor tone is noted throughout.  Sensory: Sensory testing is intact to soft touch on all 4 extremities. No evidence of extinction is noted.  Coordination: Cerebellar testing reveals good finger-nose-finger and heel-to-shin bilaterally.  Gait and station: He needs pushed up to get up from seated position, antalgic, cautious Reflexes: Deep tendon reflexes are symmetric and normal bilaterally.   DIAGNOSTIC DATA (LABS, IMAGING, TESTING) - I reviewed patient records, labs, notes, testing and imaging myself where available.  Lab Results  Component Value Date   WBC 6.4 02/09/2018   HGB 14.9 02/09/2018   HCT 43.9 02/09/2018   MCV 97 02/09/2018   PLT 158 02/09/2018      Component Value Date/Time   NA 139 04/14/2017 1000   NA 138 04/29/2016   K 4.8 04/14/2017 1000   CL 105 04/14/2017 1000   CO2 25 04/14/2017 1000   GLUCOSE 93 04/14/2017 1000   BUN 29 (H) 04/14/2017 1000   BUN 19 04/29/2016   CREATININE 1.30 (H) 04/14/2017 1000   CALCIUM 8.8 (L) 04/14/2017 1000   PROT 6.3 01/13/2017 0846   ALBUMIN 4.4 01/13/2017 0846   AST 26 01/13/2017 0846   ALT 20 01/13/2017 0846  ALKPHOS 79 01/13/2017 0846   BILITOT 0.4 01/13/2017 0846   GFRNONAA 51 (L) 04/14/2017 1000   GFRAA 59 (L) 04/14/2017 1000   Lab Results  Component Value Date   CHOL 90 (L) 01/13/2017   HDL 29 (L) 01/13/2017   LDLCALC 33 01/13/2017   TRIG 138 01/13/2017   CHOLHDL 3.8 01/18/2016      ASSESSMENT AND PLAN 78 y.o. year old male   Restless leg symptoms  Repeat ferritin level was 49, iron supplement  Continue Lyrica 100 mg 3 tablets at bedtime change to 30 minutes before bedtime, Cymbalta 60 mg every morning,  Keep Requip 0.5 mg at bedtime,  Clonazepam 0.5mg  1/2 to 1 tablets every night as needed, instead of hot tub for his restless leg      Marcial Pacas, M.D. Ph.D.  [facility] [place], [place] [address] Phone: [phone] Fax:      [phone]

## 2018-08-12 ENCOUNTER — Other Ambulatory Visit: Payer: Self-pay | Admitting: Neurology

## 2018-09-01 ENCOUNTER — Other Ambulatory Visit: Payer: Self-pay | Admitting: Neurology

## 2018-09-03 ENCOUNTER — Other Ambulatory Visit: Payer: Self-pay | Admitting: Neurology

## 2019-01-27 ENCOUNTER — Other Ambulatory Visit: Payer: Self-pay | Admitting: Neurology

## 2019-02-09 NOTE — Progress Notes (Signed)
PATIENT: [name] DOB: 10-29-1940  REASON FOR VISIT: follow up HISTORY FROM: patient  HISTORY OF PRESENT ILLNESS: Today 02/10/19  HISTORY  HISTORY Per Dr. [name] notes: HISTORY : [name] is a 78 yo RH male, accompanied by his wife, referred by Dr. Jacelyn Grip, and his primary care physician Dr. Leonides Schanz for evaluation of chronic low back pain, bilateral lower extremity muscle ache He had a past medical history of hypertension, hyperlipidemia, presenting with chronic low back pain, bilateral lower extremity muscle achy pain since 2010.  Initially it was thought due to his statin treatment, he has tried different statin without improving his symptoms,over the years, he also received multiple lumbar bilateral facet joint fluoroscopy guided injection without improving his symptoms.  In 2012, he was diagnosed with L4 and 5 lumbar stenosis, had lumbar laminectomy L3-4, L4-L5 by Dr. [name] in July 2012,without improving his symptoms, He was seen by different specialists, tried different medications, this including Elavil, Xanax as needed, he is currently taking hydrocodone 5/325 mg 1-2 tablets every night, Xanax 0.25 milligram as needed, Flexeril 10 mg every night, gabapentin 600 mg every night without significant improvement, During the daytime, he denies significant low back pain, or bilateral lower extremity pain, he does has history of right ankle fracture, mild gait difficulty due to right ankle pain, but starting at evening time, he began to noticed deep achy pain starting from midline low back, radiating along bilateral lateral leg, constant, he tends to pace around, has difficulty sleeping at nighttime, sometimes he has to got up take a hot bath couple times every night, put ice on his back, on his feet, only provide temporary relief. He has also tried acupuncture, massage, TENS unit, without significant improvement, previously, he has a short trial of low-dose Requip, without improving  his symptoms, no significant side effect noticed either. He has bilateral lateral 3 toes numbness, no weakness, no bilateral fingertips numbness, or weakness Per record, electrodiagnostic study failed to demonstrate significant etiology, We have reviewed MRI lumbar in 2012, there was evidence of L4-5 spinal stenosis, and the most recent MRI Lumbar was in June 2014,progression of multilevel facet arthropathy, status post a laminectomy at L3-4, with residual large synovial cyst extending along the posterior epidural space without significant central canal stenosis, mild foraminal narrowing bilaterally at L3 and 4, progression of mild lateral recess narrowing bilaterally at L4-5, foraminal narrowing is stable at this level MRI of the brain in June 2014 showed mild atrophy, no acute intracranial abnormality  UPDATE Dec 15th 2015:  He is tolerating gabapentin 300 mg 2 tablets every night, Requip 1 mg 2 tablets every night, he can sleep better with the medications, but continue complains of bilateral lower extremity dull achy pain, numbness, urgency to move, especially at nighttime, He also complains of slow progressive gait difficulty, grasps things for support, he has urinary urgency, but no incontinence.  UPDATE Jan 29th 2016: He is now taking gabapentin 300mg  2 tabs qhs and requip 1mg , 3 tabs qhs, which has been very helpful, 30%, improvement, he still has bilatarel leg pain at evening, stretching,massage, He denies bowel and bladder incontinence , he has tinlging at the soles of his feet.   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  He continue complaining significant difficulty  at nighttime, came in with a full page list of symptoms, in the morning time, he denies significant back pain, no low back pain, symptoms started in the late afternoon, gradually building up, around 9: to10 PM, he noticed achy feeling down lateral side of both leg, increased by sitting still, intense urge to lie on the floor, stretching muscle backwards, late night hot soaking bath to help him sleep, he has to take about 3 AM last night, difficulty sleeping, excessive fatigue during the daytime, could not sit through movies, standing up in the aisle 2/3 of the movie He is now taking, hydrocodone 5/325mg  2 tabs 9pm, advil 200mg  2 tab   He has quit taking Requip 1 mg, up to 4 tablets every night, also gabapentin, complains of upset stomach, without relieving his symptoms, he is now taking Advil 200 mg 2 tablets as needed, hydrocodone/Tylenol 5/325 mg 2 tablets every night for symptoms control, which only mild, temporarily, Tylenol helps some too.  UPDATE April 29th 2016:Laboratory showed low iron, ferritin level at 9, he was put on iron supplement, his restless leg symptoms has much improved, He is now taking clonazepam 0.5 milligram every night, Lyrica 50 mg every night, he can sleep much better,  He continue, combat right ankle pain  UPDATE August 2nd 2016: He sleeps well, he is now taking clonazepam 0.5mg  qhs, Lyrica 50mg  3 tabs qhs, no longer on Hydrocodone, he complains of side effect  UPDATE August 06 2016: I reviewed operation record on April 18 2016, he had right lumbar L3-4, L4-5 anterolateral lumbar interbody fusion, percutaneous pedicle screw on April 18 2016, which has helped his left-sided low back pain radiating pain to left lower extremity, 4. Of time he was almost pain-free, unfortunately he fell landed on his right side, is then he began to experience excruciating radiating pain from left side to left anterior thigh.  We have personally reviewed and compared to MRI lumbar  in October 2017, and in February 2018. Which showed pedicle screw and interbody fusion at L3-4, L4-5 without stenosis, small fluid collection posterior to the thecal sac at L3-4, moderate right foraminal narrowing, mild foraminal narrowing at L4-5.  He is planning on to have second left-sided lumbar decompression surgery on August 15 2016  His restless leg symptoms is under good control with current dose of Lyrica 100 mg twice a day, clonazepam as needed, oxycodone 5 mg every night  His most recent ferritin level was 47, hemoglobin was 11 point 5,  UPDATE August 07 2017: He is accompanied by his wife at today's clinical visit, complaining worsening restless leg symptoms, for a while, his restless leg symptoms seems to be under okay control.  Since 2017, he had multiple surgery, lumbar fusion by Dr. Birdena Crandall November 2017, redo in March 2018, left hip replacement in November 2018,  He has baseline gait abnormality, but no longer have significant pain, at nighttime, 1-2 hours after lying down, he has uncontrollable right leg muscle jerking, urged to move,  He is now taking Lyrica 100 mg 2 tablets at nighttime, clonazepam 0.5 mg every night, often have to take 1-1/2 tablets of Percocet 5/325 mg early morning time if he could not sleep his combination medication treatment.  Sometimes he has to take hot bath in the middle of the night to help him go to sleep.  UPDATE Sept 9 2019: Now he is taking generic Lyric 100mg  3 tabs qhs, Requip  0.5 mg every night,  20% of time he is able to sleep without any difficulties, but 80% of the time about 1 to 2 hours after he goes to bed, he has severe leg discomfort, has to take a hot bath, then was able to sleep the rest of the night, sometimes he took clonazepam 0.5 mg half to 1 tablets before bedtime, complains of excessive drowsiness even to the point of wetting his bed,   His ferritin level is low 15 ng/ml March 2019, last colonoscopy was in 2017,  hemoglobin in November 2018 prior to his left hip surgery was 13, he is now on iron supplement  UPDATE August 10 2018: Ferritin in Sept 2019 was 49.  He continues to have restless leg symptoms, halftime in a week, he has trouble sleeping due to restless leg symptoms, to get up to take a hot shower, which usually is very helpful, is taking Lyrica 100 in the morning, 200 mg at 2 hours prior to sleep, also take iron supplement,  Update February 10, 2019 SS: Is taking Lyrica 100 mg, 3 at bed bedtime, Cymbalta 60 mg daily, Requip 0.5 mg at bedtime, Klonopin 0.5 mg 0.5-1 tablet as needed; he feels his current regimen is adequately controlling his pain.  He is sleeping well at night.  He says over time the sensation is increasing up his legs gradually.  He says the sensation is most intense in the morning.  He remains on iron supplement.  His ferritin in September 2019 was 49, normal.  He only has to take Klonopin 1-2 times a month.  He is happy with his current regimen.  He has chronic right knee pain, left hip pain.  REVIEW OF SYSTEMS: Out of a complete 14 system review of symptoms, the patient complains only of the following symptoms, and all other reviewed systems are negative.  Numbness, walking difficulty, restless leg  ALLERGIES: Allergies  Allergen Reactions   Compazine [Prochlorperazine Edisylate] Other (See Comments)    EXTRAPYRAMIDAL MOVEMENT [Involuntary muscle movement]   Penicillins Nausea And Vomiting, Rash and Other (See Comments)    Has patient had a PCN reaction causing immediate rash, facial/tongue/throat swelling, SOB or lightheadedness with hypotension: #  #  #  YES  #  #  #  Has patient had a PCN reaction causing severe rash involving mucus membranes or skin necrosis: No Has patient had a PCN reaction that required hospitalization No Has patient had a PCN reaction occurring within the last 10 years: No If all of the above answers are "NO", then may proceed with Cephalosporin  use.    Welchol [Colesevelam Hcl] Other (See Comments)    MYALGIAS [Leg aches]   Gemfibrozil Nausea Only    PATIENT TOLERATES   Red Yeast Rice [Cholestin] Other (See Comments)    flushing    HOME MEDICATIONS: Outpatient Medications Prior to Visit  Medication Sig Dispense Refill   carvedilol (COREG) 3.125 MG tablet Take 3.125 mg by mouth at bedtime.      clonazePAM (KLONOPIN) 0.5 MG tablet Take 0.5 to 1 tablet at bedtime as needed for RLS. 30 tablet 5   DULoxetine (CYMBALTA) 60 MG capsule Take 60 mg daily by mouth.      ELIQUIS 5 MG TABS tablet Take 5 mg by mouth 2 (two) times daily.     ezetimibe (ZETIA) 10 MG tablet Take 1 tablet (10 mg total) by mouth daily. 30 tablet 6   ferrous sulfate 324 (65 Fe) MG TBEC Take 1  tablet by mouth daily.     ibuprofen (ADVIL) 200 MG tablet Take 200 mg by mouth as needed (back pain).     lisinopril (PRINIVIL,ZESTRIL) 10 MG tablet Take 10 mg by mouth at bedtime.      Multiple Vitamins-Minerals (MULTIVITAMIN PO) Take 1 tablet by mouth daily.     nitroGLYCERIN (NITROSTAT) 0.4 MG SL tablet Place 0.4 mg under the tongue every 5 (five) minutes as needed for chest pain.     polyethylene glycol (MIRALAX / GLYCOLAX) packet Take 17 g by mouth daily as needed for mild constipation.     pregabalin (LYRICA) 100 MG capsule TAKE (1) CAPSULE THREE TIMES DAILY. (Patient taking differently: Take 300 mg by mouth at bedtime. ) 90 capsule 5   rOPINIRole (REQUIP) 0.5 MG tablet TAKE ONE TABLET AT BEDTIME. 30 tablet 11   rosuvastatin (CRESTOR) 10 MG tablet TAKE 1 TABLET ONCE DAILY OR AS DIRECTED. 90 tablet 2   tamsulosin (FLOMAX) 0.4 MG CAPS capsule Take 0.4 mg by mouth daily.     methocarbamol (ROBAXIN) 500 MG tablet Take 500 mg by mouth at bedtime as needed for muscle spasms.      esomeprazole (NEXIUM) 20 MG capsule Take 20 mg by mouth daily at 12 noon.     No facility-administered medications prior to visit.     PAST MEDICAL HISTORY: Past Medical  History:  Diagnosis Date   Anginal pain ([place])    NONE IN 3 YEARS   Arthritis    Atrial fibrillation ([place]) 1962   a. No recurrence since 1962.   Chest pain    a. 1998 Cath: nl cors;  b. 2009 Cath: nl cors.   Chronic kidney disease    stage 3   Depression    Dysrhythmia    NO TROUBLE IN 1 YR    DR. PETER [place]    Esophageal reflux    Essential hypertension    History of Paroxysmal atrial flutter ([facility])    Hyperlipidemia    Incontinence of urine    Leg pain, bilateral    Nocturia    PONV (postoperative nausea and vomiting)    CONSTIPATED   Scarlet fever 1945   Spinal stenosis    Staph skin infection    LEFT GROIN  04/11/16  TX W/ DOXYCYCLINE   Ulcerative proctitis ([place])     PAST SURGICAL HISTORY: Past Surgical History:  Procedure Laterality Date   ANKLE RECONSTRUCTION Right 2007   [place]   ANTERIOR LAT LUMBAR FUSION Right 04/18/2016   Procedure: RIGHT LUMBAR THREE-FOUR, LUMBAR FOUR-FIVE ANTEROLATERAL LUMBAR INTERBODY FUSION;  Surgeon: Erline Levine, MD;  Location: [facility];  Service: Neurosurgery;  Laterality: Right;  RIGHT L3-4 L4-5 ANTEROLATERAL LUMBAR INTERBODY FUSION   CARDIAC CATHETERIZATION  03/03/2008   Left heart cardiac catheterization and coronary   CARDIAC CATHETERIZATION  1997   Dr Wynonia Lawman   CATARACT EXTRACTION W/ INTRAOCULAR LENS  IMPLANT, BILATERAL     2016   ESOPHAGEAL DILATION     2008   INNER EAR SURGERY     EAR INJECTION FOR MENIERES   KNEE ARTHROSCOPY  1991, 1995   LUMBAR LAMINECTOMY  12/2010   LUMBAR LAMINECTOMY/DECOMPRESSION MICRODISCECTOMY Left 08/15/2016   Procedure: Left Left three- four Redo laminectomy;  Surgeon: Erline Levine, MD;  Location: [facility];  Service: Neurosurgery;  Laterality: Left;  Left L3-4 Redo laminectomy   LUMBAR PERCUTANEOUS PEDICLE SCREW 2 LEVEL N/A 04/18/2016   Procedure: LUMBAR THREE-FOUR, LUMBAR FOUR-FIVE PERCUTANEOUS PEDICLE SCREW;  Surgeon: Erline Levine, MD;  Location: [facility];  Service: Neurosurgery;   Laterality: N/A;   TOTAL HIP ARTHROPLASTY Left 04/22/2017   TOTAL HIP ARTHROPLASTY Left 04/22/2017   Procedure: TOTAL HIP ARTHROPLASTY ANTERIOR APPROACH;  Surgeon: Melrose Nakayama, MD;  Location: [facility];  Service: Orthopedics;  Laterality: Left;    FAMILY HISTORY: Family History  Problem Relation Age of Onset   Prostate cancer Father    Heart attack Brother    Kidney cancer Brother    Brain cancer Brother     SOCIAL HISTORY: Social History   Socioeconomic History   Marital status: Married    Spouse name: Bethena Roys   Number of children: 3   Years of education: college   Highest education level: Not on file  Occupational History    Comment: Retired   Scientist, product/process development strain: Not on file   Food insecurity    Worry: Not on file    Inability: Not on Lexicographer needs    Medical: Not on file    Non-medical: Not on file  Tobacco Use   Smoking status: Current Some Day Smoker    Types: Pipe   Smokeless tobacco: Never Used  Substance and Sexual Activity   Alcohol use: Yes    Alcohol/week: 1.0 standard drinks    Types: 1 Shots of liquor per week    Comment: 1-3 weekly   Drug use: No   Sexual activity: Not on file  Lifestyle   Physical activity    Days per week: Not on file    Minutes per session: Not on file   Stress: Not on file  Relationships   Social connections    Talks on phone: Not on file    Gets together: Not on file    Attends religious service: Not on file    Active member of club or organization: Not on file    Attends meetings of clubs or organizations: Not on file    Relationship status: Not on file   Intimate partner violence    Fear of current or ex partner: Not on file    Emotionally abused: Not on file    Physically abused: Not on file    Forced sexual activity: Not on file  Other Topics Concern   Not on file  Social History Narrative   Patient is retired and lives at home with his wife Bethena Roys.     Education college.   Caffeine one cup daily.    PHYSICAL EXAM  Vitals:   02/10/19 1423  BP: 116/81  Pulse: 84  Temp: (!) 97.1 F (36.2 C)  TempSrc: Oral  Weight: 253 lb 6.4 oz (114.9 kg)  Height: 5\' 10"  (1.778 m)   Body mass index is 36.36 kg/m.  Generalized: Well developed, in no acute distress   Neurological examination  Mentation: Alert oriented to time, place, history taking. Follows all commands speech and language fluent Cranial nerve II-XII: Pupils were equal round reactive to light. Extraocular movements were full, visual field were full on confrontational test. Facial sensation and strength were normal.  Head turning and shoulder shrug  were normal and symmetric. Motor: The motor testing reveals 5 over 5 strength of all 4 extremities. Good symmetric motor tone is noted throughout.  Sensory: Sensory testing is intact to soft touch, pinprick on all 4 extremities.  Decreased vibration sensation in lower extremities.  No evidence of extinction is noted.  Coordination: Cerebellar testing reveals good finger-nose-finger and heel-to-shin bilaterally.  Gait and station:  Gait is normal.  Reflexes: Deep tendon reflexes are symmetric and normal bilaterally.   DIAGNOSTIC DATA (LABS, IMAGING, TESTING) - I reviewed patient records, labs, notes, testing and imaging myself where available.  Lab Results  Component Value Date   WBC 6.4 02/09/2018   HGB 14.9 02/09/2018   HCT 43.9 02/09/2018   MCV 97 02/09/2018   PLT 158 02/09/2018      Component Value Date/Time   NA 139 04/14/2017 1000   NA 138 04/29/2016   K 4.8 04/14/2017 1000   CL 105 04/14/2017 1000   CO2 25 04/14/2017 1000   GLUCOSE 93 04/14/2017 1000   BUN 29 (H) 04/14/2017 1000   BUN 19 04/29/2016   CREATININE 1.30 (H) 04/14/2017 1000   CALCIUM 8.8 (L) 04/14/2017 1000   PROT 6.3 01/13/2017 0846   ALBUMIN 4.4 01/13/2017 0846   AST 26 01/13/2017 0846   ALT 20 01/13/2017 0846   ALKPHOS 79 01/13/2017 0846   BILITOT  0.4 01/13/2017 0846   GFRNONAA 51 (L) 04/14/2017 1000   GFRAA 59 (L) 04/14/2017 1000   Lab Results  Component Value Date   CHOL 90 (L) 01/13/2017   HDL 29 (L) 01/13/2017   LDLCALC 33 01/13/2017   TRIG 138 01/13/2017   CHOLHDL 3.8 01/18/2016   No results found for: HGBA1C No results found for: VITAMINB12 Lab Results  Component Value Date   TSH 1.640 08/07/2017    ASSESSMENT AND PLAN 78 y.o. year old male  has a past medical history of Anginal pain ([place]), Arthritis, Atrial fibrillation ([place]) (1962), Chest pain, Chronic kidney disease, Depression, Dysrhythmia, Esophageal reflux, Essential hypertension, History of Paroxysmal atrial flutter ([place]), Hyperlipidemia, Incontinence of urine, Leg pain, bilateral, Nocturia, PONV (postoperative nausea and vomiting), Scarlet fever (1945), Spinal stenosis, Staph skin infection, and Ulcerative proctitis ([place]). here with:  1.  Restless leg symptoms -Current regimen is working to adequately control his pain -September 2019 repeat ferritin was 49, continues on iron supplement -Continue Lyrica 100 mg capsule, 3 at bedtime -Continue Cymbalta 60 mg daily (filled by his primary) -Continue Requip 0.5 mg at bedtime -Continue clonazepam 0.5 mg, 1/2-1 tablet as needed -He does not need refills at this time -He will follow-up in 6 months or sooner if needed  I spent 15 minutes with the patient. 50% of this time was spent discussing his plan of care.   Butler Denmark, AGNP-C, DNP 02/10/2019, 2:48 PM [facility] [address], [place], [place] [address] [phone]

## 2019-02-10 ENCOUNTER — Encounter: Payer: Self-pay | Admitting: Neurology

## 2019-02-10 ENCOUNTER — Ambulatory Visit (INDEPENDENT_AMBULATORY_CARE_PROVIDER_SITE_OTHER): Payer: Medicare Other | Admitting: Neurology

## 2019-02-10 ENCOUNTER — Other Ambulatory Visit: Payer: Self-pay

## 2019-02-10 VITALS — BP 116/81 | HR 84 | Temp 97.1°F | Ht 70.0 in | Wt 253.4 lb

## 2019-02-10 DIAGNOSIS — G2581 Restless legs syndrome: Secondary | ICD-10-CM

## 2019-02-10 NOTE — Patient Instructions (Signed)
Continue current medications, no changes

## 2019-02-12 ENCOUNTER — Other Ambulatory Visit: Payer: Self-pay | Admitting: Neurology

## 2019-02-15 NOTE — Progress Notes (Signed)
I have reviewed and agreed above plan. 

## 2019-02-18 ENCOUNTER — Telehealth: Payer: Self-pay | Admitting: Neurology

## 2019-02-18 NOTE — Telephone Encounter (Signed)
Pt's pharmacy called stating that they have sent two request for the pts rOPINIRole (REQUIP) 0.5 MG tablet and they are wanting to know when will it be approved so that they can fill it. Please advise.

## 2019-02-18 NOTE — Telephone Encounter (Signed)
I called the [facility] and spoke to the pharmacist.  She apologized and stated that there are refills.  She will fill.

## 2019-03-16 NOTE — Progress Notes (Signed)
Virtual Visit via Telephone Note   This visit type was conducted due to national recommendations for restrictions regarding the COVID-19 Pandemic (eHawkinsg. social distancing) in an effort to limit this patient's exposure and mitigate transmission in our community.  Due to his co-morbid illnesses, this patient is at least at moderate risk for complications without adequate follow up.  This format is felt to be most appropriate for this patient at this time.  The patient did not have access to video technology/had technical difficulties with video requiring transitioning to audio format only (telephone).  All issues noted in this document were discussed and addressed.  No physical exam could be performed with this format.  Please refer to the patient's chart for his  consent to telehealth for [name].   Date:  Hawkins   ID:  [name], DOB 07-07-1940, MRN [id number]  Patient Location: Home Provider Location: Home  PCP:  Andrew Caraway, Hawkins  Cardiologist:  [name] Electrophysiologist:  None   Evaluation Performed:  Follow-Up Visit  Chief Complaint:  Follow up AFib  History of Present Illness:    [name] is a 78 yHawkinso. male with a history of mixed hyperlipidemia, HTN, and obesity.  He has been intolerant to statins due to severe myalgias. This includes lipitor at a dose of 20 mg daily and Crestor 10 mg 3 days a week. Welchol apparently made no change in his lipid levels. Niacin was associated with severe flushing.   He has no history of vascular disease. He had normal cardiac caths in 1998 and 2009. No history of PAD or CVA/TIA.  He apparently had afib in 1962 but no documented recurrence afterwards.   He was seen in early 2019 for routine physical and found to be in Afib with controlled rate. Not symptomatic. Started on anticoagulation with Eliquis. Echo showed Normal LV and valvular function. Moderate LAE.  He is limited by knee pain. Doesn't want to have it replaced  with pandemic. He is still unaware of his Afib. No chest pain, SOB, palpitations or dizziness.   The patient does not have symptoms concerning for COVID-19 infection (fever, chills, cough, or new shortness of breath).    Past Medical History:  Diagnosis Date  . Anginal pain (HCC)    NONE IN 3 YEARS  . Arthritis   . Atrial fibrillation ([place]) 1962   a. No recurrence since 1962.  . Chest pain    a. 1998 Cath: nl cors;  b. 2009 Cath: nl cors.  . Chronic kidney disease    stage 3  . Depression   . Dysrhythmia    NO TROUBLE IN 1 YR    Andrew. [place] Schlauch Hawkins   . Esophageal reflux   . Essential hypertension   . History of Paroxysmal atrial flutter ([place])   . Hyperlipidemia   . Incontinence of urine   . Leg pain, bilateral   . Nocturia   . PONV (postoperative nausea and vomiting)    CONSTIPATED  . Scarlet fever 1945  . Spinal stenosis   . Staph skin infection    LEFT GROIN  04/11/16  TX W/ DOXYCYCLINE  . Ulcerative proctitis [facility])    Past Surgical History:  Procedure Laterality Date  . ANKLE RECONSTRUCTION Right 2007   [place]  . ANTERIOR LAT LUMBAR FUSION Right 04/18/2016   Procedure: RIGHT LUMBAR THREE-FOUR, LUMBAR FOUR-FIVE ANTEROLATERAL LUMBAR INTERBODY FUSION;  Surgeon: [name];  Location: [place];  Service: Neurosurgery;  Laterality: Right;  RIGHT L3-4 L4-5 ANTEROLATERAL LUMBAR INTERBODY FUSION  . CARDIAC CATHETERIZATION  03/03/2008   Left heart cardiac catheterization and coronary  . CARDIAC CATHETERIZATION  1997   [name]  . CATARACT EXTRACTION W/ INTRAOCULAR LENS  IMPLANT, BILATERAL     2016  . ESOPHAGEAL DILATION     2008  . INNER EAR SURGERY     EAR INJECTION FOR MENIERES  . KNEE ARTHROSCOPY  1991, 1995  . LUMBAR LAMINECTOMY  12/2010  . LUMBAR LAMINECTOMY/DECOMPRESSION MICRODISCECTOMY Left 08/15/2016   Procedure: Left Left three- four Redo laminectomy;  Surgeon: [name];  Location: [place];  Service: Neurosurgery;  Laterality: Left;  Left L3-4 Redo  laminectomy  . LUMBAR PERCUTANEOUS PEDICLE SCREW 2 LEVEL N/A 04/18/2016   Procedure: LUMBAR THREE-FOUR, LUMBAR FOUR-FIVE PERCUTANEOUS PEDICLE SCREW;  Surgeon: [name];  Location: [place];  Service: Neurosurgery;  Laterality: N/A;  . 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 TOTAL HIP ARTHROPLASTY Left 04/22/2017   Procedure: TOTAL HIP ARTHROPLASTY ANTERIOR APPROACH;  Surgeon: [name];  Location: [place];  Service: Orthopedics;  Laterality: Left;     Current Meds  Medication Sig  . carvedilol (COREG) 3Hawkins125 MG tablet Take 3Hawkins125 mg by mouth at bedtime.   . clonazePAM (KLONOPIN) 0Hawkins5 MG tablet Take 0Hawkins5 to 1 tablet at bedtime as needed for RLS.  . DULoxetine (CYMBALTA) 60 MG capsule Take 60 mg daily by mouth.   [name] 5 MG TABS tablet Take 5 mg by mouth 2 (two) times daily.  Marland Kitchen ezetimibe (ZETIA) 10 MG tablet Take 1 tablet (10 mg total) by mouth daily.  . ferrous sulfate 324 (65 Fe) MG TBEC Take 1 tablet by mouth daily.  Marland Kitchen ibuprofen (ADVIL) 200 MG tablet Take 200 mg by mouth as needed (back pain).  Marland Kitchen lisinopril (PRINIVIL,ZESTRIL) 10 MG tablet Take 10 mg by mouth at bedtime.   . methocarbamol (ROBAXIN) 500 MG tablet Take 500 mg by mouth at bedtime as needed for muscle spasms.   . Multiple Vitamins-Minerals (MULTIVITAMIN PO) Take 1 tablet by mouth daily.  . nitroGLYCERIN (NITROSTAT) 0Hawkins4 MG SL tablet Place 0Hawkins4 mg under the tongue every 5 (five) minutes as needed for chest pain.  . polyethylene glycol (MIRALAX / GLYCOLAX) packet Take 17 g by mouth daily as needed for mild constipation.  . pregabalin (LYRICA) 100 MG capsule TAKE (1) CAPSULE THREE TIMES DAILY. (Patient taking differently: Take 300 mg by mouth at bedtime. )  . rOPINIRole (REQUIP) 0Hawkins5 MG tablet TAKE ONE TABLET AT BEDTIME.  . rosuvastatin (CRESTOR) 10 MG tablet TAKE 1 TABLET ONCE DAILY OR AS DIRECTED.  Marland Kitchen tamsulosin (FLOMAX) 0Hawkins4 MG CAPS capsule Take 0Hawkins4 mg by mouth daily.     Allergies:   Compazine [prochlorperazine  edisylate], Penicillins, Welchol [colesevelam hcl], Gemfibrozil, and Red yeast rice [cholestin]   Social History   Tobacco Use  . Smoking status: Current Some Day Smoker    Types: Pipe  . Smokeless tobacco: Never Used  Substance Use Topics  . Alcohol use: Yes    Alcohol/week: 1Hawkins0 standard drinks    Types: 1 Shots of liquor per week    Comment: 1-3 weekly  . Drug use: No     Family Hx: [name] family history includes Brain cancer in his brother; Heart attack in his brother; Kidney cancer in his brother; Prostate cancer in his father.  ROS:   Please see the history of present illness.    All other systems reviewed and are negative.  Prior CV studies:   The following studies were reviewed today:  Echo 07/09/18: IMPRESSIONS   1. The left ventricle has normal systolic function of 0000000. The cavity size was normal. There is no increased left ventricular wall thickness. Left ventricular diastology could not be evaluated secondary to atrial fibrillation. 2. The right ventricle has normal systolic function. The cavity was mildly enlarged. There is no increase in right ventricular wall thickness. 3. Left atrial size was moderately dilated. 4. The mitral valve is normal in structure. 5. The tricuspid valve is normal in structure. 6. The aortic valve is normal in structure. Aortic valve regurgitation is mild by color flow Doppler. 7. The pulmonic valve was normal in structure. 8. There is mild dilatation of the ascending aorta.  FINDINGS Left Ventricle: The left ventricle has normal systolic function of 0000000. The cavity size was normal. There is no increased left ventricular wall thickness. Left ventricular diastology could not be evaluated secondary to atrial fibrillation. Right Ventricle: The right ventricle has normal systolic function. The cavity was mildly enlarged. There is no increase in right ventricular wall thickness. Left Atrium: left atrial size was  moderately dilated Right Atrium: right atrial size was normal in size Interatrial Septum: No atrial level shunt detected by color flow Doppler.  Labs/Other Tests and Data Reviewed:    EKG:  No ECG reviewed.  Recent Labs: No results found for requested labs within last 8760 hours.   Recent Lipid Panel Lab Results  Component Value Date/Time   CHOL 90 (L) 01/13/2017 08:46 AM   CHOL 146 11/10/2014 09:43 AM   TRIG 138 01/13/2017 08:46 AM   TRIG 175 (H) 11/10/2014 09:43 AM   HDL 29 (L) 01/13/2017 08:46 AM   HDL 27 (L) 11/10/2014 09:43 AM   CHOLHDL 3Hawkins8 01/18/2016 08:19 AM   LDLCALC 33 01/13/2017 08:46 AM   LDLCALC 84 11/10/2014 09:43 AM   Dated 01/04/19: A1c 5Hawkins7%. creatinine 1Hawkins51. other chemistries and Hgb normal  Wt Readings from Last 3 Encounters:  03/18/19 252 lb (114Hawkins3 kg)  02/10/19 253 lb 6Hawkins4 oz (114Hawkins9 kg)  08/10/18 250 lb 4 oz (113Hawkins5 kg)     Objective:    Vital Signs:  BP 121/90   Pulse 83   Temp (!) 96Hawkins6 F (35Hawkins9 C)   Ht 5\' 10"  (1Hawkins778 m)   Wt 252 lb (114Hawkins3 kg)   SpO2 98%   BMI 36Hawkins16 kg/m    VITAL SIGNS:  reviewed  ASSESSMENT & PLAN:    1. Atrial fibrillation. He is completely asymptomatic. Rate controlled on low dose Coreg. Now on Eliquis for anticoagulation. [place] Vasc of 2. Given lack of symptoms will continue with a rate control strategy.   2. Mixed hyperlipidemia. Now on Crestor and Zetia and tolerating well. Excellent control.   3. HTN- controlled. Continue  lisinopril.   4. Normal cardiac cath 2009.  COVID-19 Education: The signs and symptoms of COVID-19 were discussed with the patient and how to seek care for testing (follow up with PCP or arrange E-visit).  The importance of social distancing was discussed today.  Time:   Today, I have spent 10 minutes with the patient with telehealth technology discussing the above problems.     Medication Adjustments/Labs and Tests Ordered: Current medicines are reviewed at length with the patient today.   Concerns regarding medicines are outlined above.   Tests Ordered: No orders of the defined types were placed in this encounter.   Medication Changes: No orders of the defined types  were placed in this encounter.   Follow Up:  Either In Person or Virtual in 6 month(s)  Signed, Kourtland Coopman [place], Hawkins  Hawkins 8:16 AM    [place]

## 2019-03-18 ENCOUNTER — Encounter: Payer: Self-pay | Admitting: Cardiology

## 2019-03-18 ENCOUNTER — Telehealth (INDEPENDENT_AMBULATORY_CARE_PROVIDER_SITE_OTHER): Payer: Medicare Other | Admitting: Cardiology

## 2019-03-18 VITALS — BP 121/90 | HR 83 | Temp 96.6°F | Ht 70.0 in | Wt 252.0 lb

## 2019-03-18 DIAGNOSIS — I10 Essential (primary) hypertension: Secondary | ICD-10-CM

## 2019-03-18 DIAGNOSIS — I4819 Other persistent atrial fibrillation: Secondary | ICD-10-CM | POA: Diagnosis not present

## 2019-03-18 DIAGNOSIS — E782 Mixed hyperlipidemia: Secondary | ICD-10-CM

## 2019-03-18 NOTE — Patient Instructions (Signed)
Medication Instructions:  Continue same medications *If you need a refill on your cardiac medications before your next appointment, please call your pharmacy*  Lab Work: None ordered   Testing/Procedures: None ordered  Follow-Up: At [facility], you and your health needs are our priority.  As part of our continuing mission to provide you with exceptional heart care, we have created designated Provider Care Teams.  These Care Teams include your primary Cardiologist (physician) and Advanced Practice Providers (APPs -  Physician Assistants and Nurse Practitioners) who all work together to provide you with the care you need, when you need it.  Your next appointment:  6 months  Call in Jan to schedule April appointment

## 2019-05-12 ENCOUNTER — Encounter: Payer: Self-pay | Admitting: Neurology

## 2019-05-12 ENCOUNTER — Other Ambulatory Visit: Payer: Self-pay | Admitting: Neurology

## 2019-05-12 MED ORDER — PREGABALIN 100 MG PO CAPS: 600.0000 mg | ORAL_CAPSULE | Freq: Every day | ORAL | 5 refills | Status: DC

## 2019-05-12 NOTE — Telephone Encounter (Signed)
Hi, [name], I am taking 300 mg of Lyrica at bedtime for peripheral neuropathy and it has helped with the leg tingling, but now it is losing effectiveness.  Lately I have been relying more on OTC sleep aides and Advill, along with hot baths to get to sleep. Yesterday, I went to the [facility] about a skin eruption on my left shoulder that has kept recurring over the past three years. PA [name] examined me and asked if I was being treated for peripheral neuropathy! 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  So we need to mitigate the cause of the nerve problem, not so much the effect. She said I may want to ask you or Dr. Krista Hawkins about increasing the dosage of Lyrica.    Do you agree? Thanks, [name]   Please call patient, I have ERX Lyrica 100mg , he can try higher dose, up to 100mg  6 tablets every night

## 2019-05-13 MED ORDER — PREGABALIN 150 MG PO CAPS: 150.0000 mg | ORAL_CAPSULE | Freq: Two times a day (BID) | ORAL | 5 refills | Status: DC

## 2019-05-13 NOTE — Telephone Encounter (Signed)
His insurance will not cover six capsules per day. Per vo by Dr. [name], she will offer him a slight increase in dose at 150mg  TID or he may try to bump up higher to 200mg  TID.    I spoke to the patient and based on his symptoms, he would like to try the lower dose first.  Dr. [name] will send in a prescription for Lyrica 150mg , one capsule TID.  Previously sent rx will be voided at the pharmacy.  The patient verbalized understanding to contact us again if this dose is not helpful.

## 2019-05-13 NOTE — Addendum Note (Signed)
Addended by: Noberto Retort C on: 05/13/2019 10:21 AM   Modules accepted: Orders

## 2019-05-29 ENCOUNTER — Ambulatory Visit (HOSPITAL_COMMUNITY)
Admission: RE | Admit: 2019-05-29 | Discharge: 2019-05-29 | Disposition: A | Discharge status: Home / Self Care | Payer: Medicare Other | Source: Ambulatory Visit | Attending: Gastroenterology | Admitting: Gastroenterology

## 2019-05-29 ENCOUNTER — Other Ambulatory Visit: Payer: Self-pay

## 2019-05-29 ENCOUNTER — Other Ambulatory Visit: Payer: Self-pay | Admitting: Gastroenterology

## 2019-05-29 DIAGNOSIS — R1084 Generalized abdominal pain: Secondary | ICD-10-CM | POA: Insufficient documentation

## 2019-05-29 LAB — CBC
HCT: 42.3 % (ref 39.0–52.0)
Hemoglobin: 14.3 g/dL (ref 13.0–17.0)
MCH: 33.1 pg (ref 26.0–34.0)
MCHC: 33.8 g/dL (ref 30.0–36.0)
MCV: 97.9 fL (ref 80.0–100.0)
Platelets: 185 10*3/uL (ref 150–400)
RBC: 4.32 MIL/uL (ref 4.22–5.81)
RDW: 13.2 % (ref 11.5–15.5)
WBC: 8.6 10*3/uL (ref 4.0–10.5)
nRBC: 0 % (ref 0.0–0.2)

## 2019-05-29 LAB — POCT I-STAT, CHEM 8
BUN: 28 mg/dL — ABNORMAL HIGH (ref 8–23)
Calcium, Ion: 1.18 mmol/L (ref 1.15–1.40)
Chloride: 100 mmol/L (ref 98–111)
Creatinine, Ser: 1.2 mg/dL (ref 0.61–1.24)
Glucose, Bld: 96 mg/dL (ref 70–99)
HCT: 46 % (ref 39.0–52.0)
Hemoglobin: 15.6 g/dL (ref 13.0–17.0)
Potassium: 4 mmol/L (ref 3.5–5.1)
Sodium: 135 mmol/L (ref 135–145)
TCO2: 22 mmol/L (ref 22–32)

## 2019-05-29 MED ORDER — IOHEXOL 300 MG/ML  SOLN
100.0000 mL | Freq: Once | INTRAMUSCULAR | Status: AC | PRN
  Administered 2019-05-29: 16:00:00 100 mL via INTRAVENOUS

## 2019-06-14 ENCOUNTER — Ambulatory Visit: Payer: Medicare Other | Attending: Internal Medicine

## 2019-06-14 DIAGNOSIS — Z23 Encounter for immunization: Secondary | ICD-10-CM

## 2019-06-14 NOTE — Progress Notes (Signed)
   Covid-19 Vaccination Clinic  Name:  [name]    MRN: [id number] DOB: [birth year]  06/14/2019  [name] was observed post Covid-19 immunization for 30 minutes based on pre-vaccination screening without incidence. He was provided with Vaccine Information Sheet and instruction to access the V-Safe system.   [name] was instructed to call 911 with any severe reactions post vaccine: Marland Kitchen Difficulty breathing  . Swelling of your face and throat  . A fast heartbeat  . A bad rash all over your body  . Dizziness and weakness    Immunizations Administered    Name Date Dose VIS Date Route   Pfizer COVID-19 Vaccine 06/14/2019 12:39 PM 0.3 mL 05/14/2019 Intramuscular   Manufacturer: [place]   Lot: [id number]   [place]: [id number]

## 2019-06-17 ENCOUNTER — Telehealth: Payer: Self-pay

## 2019-06-17 MED ORDER — ROPINIROLE HCL 0.5 MG PO TABS: 0.5000 mg | ORAL_TABLET | Freq: Every day | ORAL | 11 refills | Status: DC

## 2019-06-17 NOTE — Telephone Encounter (Signed)
[name] with [facility] physicians called to update [facility].

## 2019-06-17 NOTE — Telephone Encounter (Signed)
Done

## 2019-07-04 ENCOUNTER — Ambulatory Visit: Payer: Medicare Other | Attending: Internal Medicine

## 2019-07-04 DIAGNOSIS — Z23 Encounter for immunization: Secondary | ICD-10-CM | POA: Insufficient documentation

## 2019-07-04 NOTE — Progress Notes (Signed)
   Covid-19 Vaccination Clinic  Name:  OMMAR NOTTAGE    MRN: [id number] DOB: 02-Dec-1940  07/04/2019  Mr. Chea was observed post Covid-19 immunization for 30 minutes based on pre-vaccination screening without incidence. He was provided with Vaccine Information Sheet and instruction to access the V-Safe system.   Mr. Schnitker was instructed to call 911 with any severe reactions post vaccine: Marland Kitchen Difficulty breathing  . Swelling of your face and throat  . A fast heartbeat  . A bad rash all over your body  . Dizziness and weakness    Immunizations Administered    Name Date Dose VIS Date Route   Pfizer COVID-19 Vaccine 07/04/2019 10:55 AM 0.3 mL 05/14/2019 Intramuscular   Manufacturer: [place]   Lot: [id number]   [place]: [id number]

## 2019-07-31 ENCOUNTER — Other Ambulatory Visit: Payer: Self-pay | Admitting: Neurology

## 2019-08-02 ENCOUNTER — Other Ambulatory Visit: Payer: Self-pay | Admitting: Neurology

## 2019-08-03 ENCOUNTER — Other Ambulatory Visit: Payer: Self-pay | Admitting: Neurology

## 2019-08-06 ENCOUNTER — Other Ambulatory Visit: Payer: Self-pay | Admitting: Cardiology

## 2019-08-06 NOTE — Telephone Encounter (Signed)
*  STAT* If patient is at the pharmacy, call can be transferred to refill team.   1. Which medications need to be refilled? (please list name of each medication and dose if known) ELIQUIS 5 MG TABS tablet  2. Which pharmacy/location (including street and city if local pharmacy) is medication to be sent to? [facility] - [place], [place] - [place] [address]  3. Do they need a 30 day or 90 day supply? 90 day

## 2019-08-09 NOTE — Telephone Encounter (Signed)
°*  STAT* If patient is at the pharmacy, call can be transferred to refill team.   1. Which medications need to be refilled? (please list name of each medication and dose if known)  New prescription for Eliquis  2. Which pharmacy/location (including street and city if local pharmacy) is medication to be sent to? [facility]- [phone]  3. Do they need a 30 day or 90 day supply?90 days and refills

## 2019-08-10 ENCOUNTER — Encounter: Payer: Self-pay | Admitting: Neurology

## 2019-08-10 ENCOUNTER — Other Ambulatory Visit: Payer: Self-pay

## 2019-08-10 ENCOUNTER — Ambulatory Visit: Payer: Medicare Other | Admitting: Neurology

## 2019-08-10 VITALS — BP 114/76 | HR 53 | Temp 97.1°F | Ht 70.0 in | Wt 247.0 lb

## 2019-08-10 DIAGNOSIS — R269 Unspecified abnormalities of gait and mobility: Secondary | ICD-10-CM

## 2019-08-10 DIAGNOSIS — M545 Low back pain, unspecified: Secondary | ICD-10-CM

## 2019-08-10 DIAGNOSIS — G2581 Restless legs syndrome: Secondary | ICD-10-CM

## 2019-08-10 MED ORDER — PREGABALIN 100 MG PO CAPS: 300.0000 mg | ORAL_CAPSULE | Freq: Every evening | ORAL | 5 refills | Status: DC

## 2019-08-10 MED ORDER — CLONAZEPAM 0.5 MG PO TABS: ORAL_TABLET | ORAL | 5 refills | Status: DC

## 2019-08-10 MED ORDER — DULOXETINE HCL 60 MG PO CPEP: 60.0000 mg | ORAL_CAPSULE | Freq: Every day | ORAL | 4 refills | Status: DC

## 2019-08-10 NOTE — Progress Notes (Addendum)
PATIENT: [name] DOB: [birth year]  REASON FOR VISIT: follow up HISTORY FROM: patient  HISTORY OF PRESENT ILLNESS: [name] is a 79 yo RH male, accompanied by his wife, referred by Dr. Jacelyn Grip, and his primary care physician Dr. Leonides Schanz for evaluation of chronic low back pain, bilateral lower extremity muscle ache He had a past medical history of hypertension, hyperlipidemia, presenting with chronic low back pain, bilateral lower extremity muscle achy pain since 2010.  Initially it was thought due to his statin treatment, he has tried different statin without improving his symptoms,over the years, he also received multiple lumbar bilateral facet joint fluoroscopy guided injection without improving his symptoms.  In 2012, he was diagnosed with L4 and 5 lumbar stenosis, had lumbar laminectomy L3-4, L4-L5 by Dr. [name] in July 2012,without improving his symptoms, He was seen by different specialists, tried different medications, this including Elavil, Xanax as needed, he is currently taking hydrocodone 5/325 mg 1-2 tablets every night, Xanax 0.25 milligram as needed, Flexeril 10 mg every night, gabapentin 600 mg every night without significant improvement, During the daytime, he denies significant low back pain, or bilateral lower extremity pain, he does has history of right ankle fracture, mild gait difficulty due to right ankle pain, but starting at evening time, he began to noticed deep achy pain starting from midline low back, radiating along bilateral lateral leg, constant, he tends to pace around, has difficulty sleeping at nighttime, sometimes he has to got up take a hot bath couple times every night, put ice on his back, on his feet, only provide temporary relief. He has also tried acupuncture, massage, TENS unit, without significant improvement, previously, he has a short trial of low-dose Requip, without improving his symptoms, no significant side effect noticed either. He has  bilateral lateral 3 toes numbness, no weakness, no bilateral fingertips numbness, or weakness Per record, electrodiagnostic study failed to demonstrate significant etiology, We have reviewed MRI lumbar in 2012, there was evidence of L4-5 spinal stenosis, and the most recent MRI Lumbar was in June 2014,progression of multilevel facet arthropathy, status post a laminectomy at L3-4, with residual large synovial cyst extending along the posterior epidural space without significant central canal stenosis, mild foraminal narrowing bilaterally at L3 and 4, progression of mild lateral recess narrowing bilaterally at L4-5, foraminal narrowing is stable at this level MRI of the brain in June 2014 showed mild atrophy, no acute intracranial abnormality  UPDATE Dec 15th 2015:  He is tolerating gabapentin 300 mg 2 tablets every night, Requip 1 mg 2 tablets every night, he can sleep better with the medications, but continue complains of bilateral lower extremity dull achy pain, numbness, urgency to move, especially at nighttime, He also complains of slow progressive gait difficulty, grasps things for support, he has urinary urgency, but no incontinence.  UPDATE Jan 29th 2016: He is now taking gabapentin 300mg  2 tabs qhs and requip 1mg , 3 tabs qhs, which has been very helpful, 30%, improvement, he still has bilatarel leg pain at evening, stretching,massage, He denies bowel and bladder incontinence , he has tinlging at the soles of his feet.   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  He continue complaining significant difficulty at  nighttime, came in with a full page list of symptoms, in the  morning time, he denies significant back pain, no low back pain, symptoms started in the late afternoon, gradually building up, around 9: to10 PM, he noticed achy feeling down lateral side of both leg, increased by sitting still, intense urge to lie on the floor, stretching muscle backwards, late night hot soaking bath to help him sleep, he has to take about 3 AM last night, difficulty sleeping, excessive fatigue during the daytime, could not sit through movies, standing up in the aisle 2/3 of the movie He is now taking, hydrocodone 5/325mg  2 tabs 9pm, advil 200mg  2 tab   He has quit taking Requip 1 mg, up to 4 tablets every night, also gabapentin, complains of upset stomach, without relieving his symptoms, he is now taking Advil 200 mg 2 tablets as needed, hydrocodone/Tylenol 5/325 mg 2 tablets every night for symptoms control, which only mild, temporarily, Tylenol helps some too.  UPDATE April 29th 2016:Laboratory showed low iron, ferritin level at 9, he was put on iron supplement, his restless leg symptoms has much improved, He is now taking clonazepam 0.5 milligram every night, Lyrica 50 mg every night, he can sleep much better,  He continue, combat right ankle pain  UPDATE August 2nd 2016: He sleeps well, he is now taking clonazepam 0.5mg  qhs, Lyrica 50mg  3 tabs qhs, no longer on Hydrocodone, he complains of side effect  UPDATE August 06 2016: I reviewed operation record on April 18 2016, he had right lumbar L3-4, L4-5 anterolateral lumbar interbody fusion, percutaneous pedicle screw on April 18 2016, which has helped his left-sided low back pain radiating pain to left lower extremity, 4. Of time he was almost pain-free, unfortunately he fell landed on his right side, is then he began to experience excruciating radiating pain from left side to left anterior thigh.  We have personally reviewed and compared to MRI lumbar in October 2017, and in February 2018. Which showed pedicle  screw and interbody fusion at L3-4, L4-5 without stenosis, small fluid collection posterior to the thecal sac at L3-4, moderate right foraminal narrowing, mild foraminal narrowing at L4-5.  He is planning on to have second left-sided lumbar decompression surgery on August 15 2016  His restless leg symptoms is under good control with current dose of Lyrica 100 mg twice a day, clonazepam as needed, oxycodone 5 mg every night  His most recent ferritin level was 47, hemoglobin was 11 point 5,  UPDATE August 07 2017: He is accompanied by his wife at today's clinical visit, complaining worsening restless leg symptoms, for a while, his restless leg symptoms seems to be under okay control.  Since 2017, he had multiple surgery, lumbar fusion by Dr. Birdena Crandall November 2017, redo in March 2018, left hip replacement in November 2018,  He has baseline gait abnormality, but no longer have significant pain, at nighttime, 1-2 hours after lying down, he has uncontrollable right leg muscle jerking, urged to move,  He is now taking Lyrica 100 mg 2 tablets at nighttime, clonazepam 0.5 mg every night, often have to take 1-1/2 tablets of Percocet 5/325 mg early morning time if he could not sleep his combination medication treatment.  Sometimes he has to take hot bath in the middle of the night to help him go to sleep.  UPDATE Sept 9 2019: Now he is taking generic Lyric 100mg  3 tabs qhs, Requip 0.5 mg every night,  20% of time he is able to  sleep without any difficulties, but 80% of the time about 1 to 2 hours after he goes to bed, he has severe leg discomfort, has to take a hot bath, then was able to sleep the rest of the night, sometimes he took clonazepam 0.5 mg half to 1 tablets before bedtime, complains of excessive drowsiness even to the point of wetting his bed,   His ferritin level is low 15 ng/ml March 2019, last colonoscopy was in 2017, hemoglobin in November 2018 prior to his left hip surgery was 13,  he is now on iron supplement  UPDATE August 10 2018: Ferritin in Sept 2019 was 49.  He continues to have restless leg symptoms, halftime in a week, he has trouble sleeping due to restless leg symptoms, to get up to take a hot shower, which usually is very helpful, is taking Lyrica 100 in the morning, 200 mg at 2 hours prior to sleep, also take iron supplement,  UPDATE August 11 2019: He has lost few dear family member over short period of time, is feeling sad.  Around February 2021, he felt like his restless leg symptoms is under good control, decided to taper down Lyrica doses, instead of 100 mg 3 tablets every night, he tapered down to 100 mg every night, gradually increased difficulty sleeping, difficult to find a comfortable position for his leg, take hot bath, clonazepam 0.5 mg as needed was helpful last night, he also takes Cymbalta 60 mg daily, Requip 0.5 mg at bedtime  Laboratory evaluations in December 2020, normal CBC hemoglobin 14.3, CMP, creatinine of 1.2, ferritin was 15 August 2017, after iron supplement, improved to 49  CT abdomen in December 2020 showed no acute abnormality, large paraesophageal hiatal hernia, with approximately one half of the stomach present in the chest, diffuse colonic diverticulosis, without evidence of acute diverticulitis, mild diffuse hepatic steatosis, marked prostate gland enlargement, with enhancing nodule arising from the superior portion of the median lobe of the prostate gland.   REVIEW OF SYSTEMS: Out of a complete 14 system review of symptoms, the patient complains only of the following symptoms, and all other reviewed systems are negative.  As above  ALLERGIES: Allergies  Allergen Reactions  . Compazine [Prochlorperazine Edisylate] Other (See Comments)    EXTRAPYRAMIDAL MOVEMENT [Involuntary muscle movement]  . Penicillins Nausea And Vomiting, Rash and Other (See Comments)    Has patient had a PCN reaction causing immediate rash,  facial/tongue/throat swelling, SOB or lightheadedness with hypotension: #  #  #  YES  #  #  #  Has patient had a PCN reaction causing severe rash involving mucus membranes or skin necrosis: No Has patient had a PCN reaction that required hospitalization No Has patient had a PCN reaction occurring within the last 10 years: No If all of the above answers are "NO", then may proceed with Cephalosporin use.   [name]] Other (See Comments)    MYALGIAS [Leg aches]  . Gemfibrozil Nausea Only    PATIENT TOLERATES  . Red Yeast Rice [Cholestin] Other (See Comments)    flushing    HOME MEDICATIONS: Outpatient Medications Prior to Visit  Medication Sig Dispense Refill  . carvedilol (COREG) 3.125 MG tablet Take 3.125 mg by mouth at bedtime.     . clonazePAM (KLONOPIN) 0.5 MG tablet Take 0.5 to 1 tablet at bedtime as needed for RLS. 30 tablet 5  . DULoxetine (CYMBALTA) 60 MG capsule Take 60 mg daily by mouth.     Marland Kitchen  ELIQUIS 5 MG TABS tablet Take 5 mg by mouth 2 (two) times daily.    Marland Kitchen ezetimibe (ZETIA) 10 MG tablet Take 1 tablet (10 mg total) by mouth daily. 30 tablet 6  . ferrous sulfate 324 (65 Fe) MG TBEC Take 1 tablet by mouth daily.    Marland Kitchen ibuprofen (ADVIL) 200 MG tablet Take 200 mg by mouth as needed (back pain).    Marland Kitchen lisinopril (PRINIVIL,ZESTRIL) 10 MG tablet Take 5 mg by mouth at bedtime.     . methocarbamol (ROBAXIN) 500 MG tablet Take 500 mg by mouth at bedtime as needed for muscle spasms.     . Multiple Vitamins-Minerals (MULTIVITAMIN PO) Take 1 tablet by mouth daily.    . nitroGLYCERIN (NITROSTAT) 0.4 MG SL tablet Place 0.4 mg under the tongue every 5 (five) minutes as needed for chest pain.    . polyethylene glycol (MIRALAX / GLYCOLAX) packet Take 17 g by mouth daily as needed for mild constipation.    . pregabalin (LYRICA) 150 MG capsule Take 1 capsule (150 mg total) by mouth 2 (two) times daily. 90 capsule 5  . rOPINIRole (REQUIP) 0.5 MG tablet Take 1 tablet (0.5 mg  total) by mouth at bedtime. 30 tablet 11  . rosuvastatin (CRESTOR) 10 MG tablet TAKE 1 TABLET ONCE DAILY OR AS DIRECTED. 90 tablet 2  . tamsulosin (FLOMAX) 0.4 MG CAPS capsule Take 0.4 mg by mouth daily.     No facility-administered medications prior to visit.    PAST MEDICAL HISTORY: Past Medical History:  Diagnosis Date  . Anginal pain (HCC)    NONE IN 3 YEARS  . Arthritis   . Atrial fibrillation ([place]) 1962   a. No recurrence since 1962.  . Chest pain    a. 1998 Cath: nl cors;  b. 2009 Cath: nl cors.  . Chronic kidney disease    stage 3  . Depression   . Dysrhythmia    NO TROUBLE IN 1 YR    DR. PETER [place]   . Esophageal reflux   . Essential hypertension   . History of Paroxysmal atrial flutter ([place])   . Hyperlipidemia   . Incontinence of urine   . Leg pain, bilateral   . Nocturia   . PONV (postoperative nausea and vomiting)    CONSTIPATED  . Scarlet fever 1945  . Spinal stenosis   . Staph skin infection    LEFT GROIN  04/11/16  TX W/ DOXYCYCLINE  . Ulcerative proctitis ([place])     PAST SURGICAL HISTORY: Past Surgical History:  Procedure Laterality Date  . ANKLE RECONSTRUCTION Right 2007   [place]  . ANTERIOR LAT LUMBAR FUSION Right 04/18/2016   Procedure: RIGHT LUMBAR THREE-FOUR, LUMBAR FOUR-FIVE ANTEROLATERAL LUMBAR INTERBODY FUSION;  Surgeon: Erline Levine, MD;  Location: [facility];  Service: Neurosurgery;  Laterality: Right;  RIGHT L3-4 L4-5 ANTEROLATERAL LUMBAR INTERBODY FUSION  . CARDIAC CATHETERIZATION  03/03/2008   Left heart cardiac catheterization and coronary  . CARDIAC CATHETERIZATION  1997   Dr Wynonia Lawman  . CATARACT EXTRACTION W/ INTRAOCULAR LENS  IMPLANT, BILATERAL     2016  . ESOPHAGEAL DILATION     2008  . INNER EAR SURGERY     EAR INJECTION FOR MENIERES  . KNEE ARTHROSCOPY  1991, 1995  . LUMBAR LAMINECTOMY  12/2010  . LUMBAR LAMINECTOMY/DECOMPRESSION MICRODISCECTOMY Left 08/15/2016   Procedure: Left Left three- four Redo laminectomy;  Surgeon: Erline Levine, MD;  Location: [facility];  Service: Neurosurgery;  Laterality: Left;  Left  L3-4 Redo laminectomy  . LUMBAR PERCUTANEOUS PEDICLE SCREW 2 LEVEL N/A 04/18/2016   Procedure: LUMBAR THREE-FOUR, LUMBAR FOUR-FIVE PERCUTANEOUS PEDICLE SCREW;  Surgeon: Erline Levine, MD;  Location: [facility];  Service: Neurosurgery;  Laterality: N/A;  . 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 TOTAL HIP ARTHROPLASTY Left 04/22/2017   Procedure: TOTAL HIP ARTHROPLASTY ANTERIOR APPROACH;  Surgeon: Melrose Nakayama, MD;  Location: [facility];  Service: Orthopedics;  Laterality: Left;    FAMILY HISTORY: Family History  Problem Relation Age of Onset  . Prostate cancer Father   . Heart attack Brother   . Kidney cancer Brother   . Brain cancer Brother     SOCIAL HISTORY: Social History   Socioeconomic History  . Marital status: Married    Spouse name: Bethena Roys  . Number of children: 3  . Years of education: college  . Highest education level: Not on file  Occupational History    Comment: Retired   Tobacco Use  . Smoking status: Current Some Day Smoker    Types: Pipe  . Smokeless tobacco: Never Used  Substance and Sexual Activity  . Alcohol use: Yes    Alcohol/week: 1.0 standard drinks    Types: 1 Shots of liquor per week    Comment: 1-3 weekly  . Drug use: No  . Sexual activity: Not on file  Other Topics Concern  . Not on file  Social History Narrative   Patient is retired and lives at home with his wife Bethena Roys.    Education college.   Caffeine one cup daily.   Social Determinants of Health   Financial Resource Strain:   . Difficulty of Paying Living Expenses: Not on file  Food Insecurity:   . Worried About Charity fundraiser in the Last Year: Not on file  . Ran Out of Food in the Last Year: Not on file  Transportation Needs:   . Lack of Transportation (Medical): Not on file  . Lack of Transportation (Non-Medical): Not on file  Physical Activity:   . Days of Exercise per Week: Not on file  . Minutes of  Exercise per Session: Not on file  Stress:   . Feeling of Stress : Not on file  Social Connections:   . Frequency of Communication with Friends and Family: Not on file  . Frequency of Social Gatherings with Friends and Family: Not on file  . Attends Religious Services: Not on file  . Active Member of Clubs or Organizations: Not on file  . Attends Archivist Meetings: Not on file  . Marital Status: Not on file  Intimate Partner Violence:   . Fear of Current or Ex-Partner: Not on file  . Emotionally Abused: Not on file  . Physically Abused: Not on file  . Sexually Abused: Not on file    PHYSICAL EXAM  Vitals:   08/10/19 1445  BP: 114/76  Pulse: (!) 53  Temp: (!) 97.1 F (36.2 C)  Weight: 247 lb (112 kg)  Height: 5\' 10"  (D34-534 m)   Body mass index is 35.44 kg/m.  Generalized: Well developed, sad looking elderly  Neurological examination  Mentation: Alert oriented to time, place, history taking. Follows all commands speech and language fluent Cranial nerve II-XII: Pupils were equal round reactive to light. Extraocular movements were full, visual field were full on confrontational test. Facial sensation and strength were normal.  Head turning and shoulder shrug  were normal and symmetric. Motor: Normal tone, bulk and strength. Sensory: Decreased to light  touch, vibratory sensation at lower extremity. Coordination: Cerebellar testing reveals good finger-nose-finger and heel-to-shin bilaterally.  Gait and station: He needs push-up to get up from seated position, antalgic, unsteady. Reflexes: Deep tendon reflexes are hypoactive and symmetric laterally.  DIAGNOSTIC DATA (LABS, IMAGING, TESTING) - I reviewed patient records, labs, notes, testing and imaging myself where available.  Lab Results  Component Value Date   WBC 8.6 05/29/2019   HGB 14.3 05/29/2019   HCT 42.3 05/29/2019   MCV 97.9 05/29/2019   PLT 185 05/29/2019      Component Value Date/Time   NA 135  05/29/2019 1432   NA 138 04/29/2016 0000   K 4.0 05/29/2019 1432   CL 100 05/29/2019 1432   CO2 25 04/14/2017 1000   GLUCOSE 96 05/29/2019 1432   BUN 28 (H) 05/29/2019 1432   BUN 19 04/29/2016 0000   CREATININE 1.20 05/29/2019 1432   CALCIUM 8.8 (L) 04/14/2017 1000   PROT 6.3 01/13/2017 0846   ALBUMIN 4.4 01/13/2017 0846   AST 26 01/13/2017 0846   ALT 20 01/13/2017 0846   ALKPHOS 79 01/13/2017 0846   BILITOT 0.4 01/13/2017 0846   GFRNONAA 51 (L) 04/14/2017 1000   GFRAA 59 (L) 04/14/2017 1000   Lab Results  Component Value Date   CHOL 90 (L) 01/13/2017   HDL 29 (L) 01/13/2017   LDLCALC 33 01/13/2017   TRIG 138 01/13/2017   CHOLHDL 3.8 01/18/2016   No results found for: HGBA1C No results found for: VITAMINB12 Lab Results  Component Value Date   TSH 1.640 08/07/2017    ASSESSMENT AND PLAN 79 y.o. year old male   Restless leg syndrome  Worsening symptoms after he taper down the dose of Lyrica, will increase to 100 mg 3 tablets every night  Continue Cymbalta 60 mg daily  Requip 0.5 mg every night  Clonazepam 0.5 mg half to 1 tablet as needed,  Repeat ferritin level at next visit.  Marcial Pacas, M.D. Ph.D.  [facility] [place], [place] [address] Phone: [phone] Fax:      [phone]

## 2019-08-11 ENCOUNTER — Telehealth: Payer: Self-pay | Admitting: Neurology

## 2019-08-11 MED ORDER — ELIQUIS 5 MG PO TABS: 5.0000 mg | ORAL_TABLET | Freq: Two times a day (BID) | ORAL | 1 refills | Status: DC

## 2019-08-11 NOTE — Telephone Encounter (Signed)
Open in error

## 2019-09-11 NOTE — Progress Notes (Signed)
[name] Date of Birth: 06-08-1940 Medical Record [id number]  History of Present Illness: Mr. [name] is seen for evaluation of new onset Afib. He has a history of mixed hyperlipidemia, HTN, and obesity.  He has been intolerant to statins due to severe myalgias. This includes lipitor at a dose of 20 mg daily and Crestor 10 mg 3 days a week. Welchol apparently made no change in his lipid levels. Niacin was associated with severe flushing.   He has no history of vascular disease. He had normal cardiac caths in 1998 and 2009. No history of PAD or CVA/TIA.  He apparently had afib in 1962 but no documented recurrence afterwards.   He was seen recently by Dr [name] for routine physical and found to be in Afib with controlled rate. Not symptomatic. Started on anticoagulation with Eliquis. Echo showed Normal LV and valvular function. Moderate LAE.  He is asymptomatic with his Afib. No dyspnea, palpitations, dizziness, or chest pain. No edema. He was having a lot of GI issues in Dec. CT showed a large periesophageal hernia. Symptoms have improved. No plans for surgery.  Current Outpatient Medications on File Prior to Visit  Medication Sig Dispense Refill  . carvedilol (COREG) 3.125 MG tablet Take 3.125 mg by mouth at bedtime.     . clonazePAM (KLONOPIN) 0.5 MG tablet Take 0.5 to 1 tablet at bedtime as needed for RLS. 30 tablet 5  . DULoxetine (CYMBALTA) 60 MG capsule Take 1 capsule (60 mg total) by mouth daily. 90 capsule 4  . ELIQUIS 5 MG TABS tablet Take 1 tablet (5 mg total) by mouth 2 (two) times daily. 180 tablet 1  . ezetimibe (ZETIA) 10 MG tablet Take 1 tablet (10 mg total) by mouth daily. 30 tablet 6  . ferrous sulfate 324 (65 Fe) MG TBEC Take 1 tablet by mouth daily.    Marland Kitchen ibuprofen (ADVIL) 200 MG tablet Take 200 mg by mouth as needed (back pain).    Marland Kitchen lisinopril (PRINIVIL,ZESTRIL) 10 MG tablet Take 5 mg by mouth at bedtime.     . methocarbamol (ROBAXIN) 500 MG tablet Take 500 mg by mouth  at bedtime as needed for muscle spasms.     . Multiple Vitamins-Minerals (MULTIVITAMIN PO) Take 1 tablet by mouth daily.    . nitroGLYCERIN (NITROSTAT) 0.4 MG SL tablet Place 0.4 mg under the tongue every 5 (five) minutes as needed for chest pain.    . polyethylene glycol (MIRALAX / GLYCOLAX) packet Take 17 g by mouth daily as needed for mild constipation.    . pregabalin (LYRICA) 100 MG capsule Take 3 capsules (300 mg total) by mouth at bedtime. 270 capsule 5  . rOPINIRole (REQUIP) 0.5 MG tablet Take 1 tablet (0.5 mg total) by mouth at bedtime. 30 tablet 11  . rosuvastatin (CRESTOR) 10 MG tablet TAKE 1 TABLET ONCE DAILY OR AS DIRECTED. 90 tablet 2  . tamsulosin (FLOMAX) 0.4 MG CAPS capsule Take 0.4 mg by mouth daily.     No current facility-administered medications on file prior to visit.    Allergies  Allergen Reactions  . Compazine [Prochlorperazine Edisylate] Other (See Comments)    EXTRAPYRAMIDAL MOVEMENT [Involuntary muscle movement]  . Penicillins Nausea And Vomiting, Rash and Other (See Comments)    Has patient had a PCN reaction causing immediate rash, facial/tongue/throat swelling, SOB or lightheadedness with hypotension: #  #  #  YES  #  #  #  Has patient had a PCN reaction causing severe rash  involving mucus membranes or skin necrosis: No Has patient had a PCN reaction that required hospitalization No Has patient had a PCN reaction occurring within the last 10 years: No If all of the above answers are "NO", then may proceed with Cephalosporin use.   [name]] Other (See Comments)    MYALGIAS [Leg aches]  . Gemfibrozil Nausea Only    PATIENT TOLERATES  . Red Yeast Rice [Cholestin] Other (See Comments)    flushing    Past Medical History:  Diagnosis Date  . Anginal pain (HCC)    NONE IN 3 YEARS  . Arthritis   . Atrial fibrillation ([place]) 1962   a. No recurrence since 1962.  . Chest pain    a. 1998 Cath: nl cors;  b. 2009 Cath: nl cors.  . Chronic  kidney disease    stage 3  . Depression   . Dysrhythmia    NO TROUBLE IN 1 YR    DR. Edwardo Wojnarowski [place]   . Esophageal reflux   . Essential hypertension   . History of Paroxysmal atrial flutter ([place])   . Hyperlipidemia   . Incontinence of urine   . Leg pain, bilateral   . Nocturia   . PONV (postoperative nausea and vomiting)    CONSTIPATED  . Scarlet fever 1945  . Spinal stenosis   . Staph skin infection    LEFT GROIN  04/11/16  TX W/ DOXYCYCLINE  . Ulcerative proctitis [facility])     Past Surgical History:  Procedure Laterality Date  . ANKLE RECONSTRUCTION Right 2007   [place]  . ANTERIOR LAT LUMBAR FUSION Right 04/18/2016   Procedure: RIGHT LUMBAR THREE-FOUR, LUMBAR FOUR-FIVE ANTEROLATERAL LUMBAR INTERBODY FUSION;  Surgeon: Erline Levine, MD;  Location: [place];  Service: Neurosurgery;  Laterality: Right;  RIGHT L3-4 L4-5 ANTEROLATERAL LUMBAR INTERBODY FUSION  . CARDIAC CATHETERIZATION  03/03/2008   Left heart cardiac catheterization and coronary  . CARDIAC CATHETERIZATION  1997   Dr [name]  . CATARACT EXTRACTION W/ INTRAOCULAR LENS  IMPLANT, BILATERAL     2016  . ESOPHAGEAL DILATION     2008  . INNER EAR SURGERY     EAR INJECTION FOR MENIERES  . KNEE ARTHROSCOPY  1991, 1995  . LUMBAR LAMINECTOMY  12/2010  . LUMBAR LAMINECTOMY/DECOMPRESSION MICRODISCECTOMY Left 08/15/2016   Procedure: Left Left three- four Redo laminectomy;  Surgeon: Erline Levine, MD;  Location: [place];  Service: Neurosurgery;  Laterality: Left;  Left L3-4 Redo laminectomy  . LUMBAR PERCUTANEOUS PEDICLE SCREW 2 LEVEL N/A 04/18/2016   Procedure: LUMBAR THREE-FOUR, LUMBAR FOUR-FIVE PERCUTANEOUS PEDICLE SCREW;  Surgeon: Erline Levine, MD;  Location: [place];  Service: Neurosurgery;  Laterality: N/A;  . 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 TOTAL HIP ARTHROPLASTY Left 04/22/2017   Procedure: TOTAL HIP ARTHROPLASTY ANTERIOR APPROACH;  Surgeon: Melrose Nakayama, MD;  Location: [place];  Service: Orthopedics;  Laterality: Left;     Social History   Tobacco Use  Smoking Status Current Some Day Smoker  . Types: Pipe  Smokeless Tobacco Never Used    Social History   Substance and Sexual Activity  Alcohol Use Yes  . Alcohol/week: 1.0 standard drinks  . Types: 1 Shots of liquor per week   Comment: 1-3 weekly    Family History  Problem Relation Age of Onset  . Prostate cancer Father   . Heart attack Brother   . Kidney cancer Brother   . Brain cancer Brother     Review of Systems: As  noted in HPI.  All other systems were reviewed and are negative.  Physical Exam: BP 132/83   Pulse 79   Temp (!) 96.4 F (35.8 C)   Resp 20   Ht 5\' 10"  (1.778 m)   Wt 252 lb 12.8 oz (114.7 kg)   SpO2 98%   BMI 36.27 kg/m  GENERAL:  Well appearing WM in NAD. Walks with a cane HEENT:  PERRL, EOMI, sclera are clear. Oropharynx is clear. NECK:  No jugular venous distention, carotid upstroke brisk and symmetric, no bruits, no thyromegaly or adenopathy LUNGS:  Clear to auscultation bilaterally CHEST:  Unremarkable HEART:  IRRR,  PMI not displaced or sustained,S1 and S2 within normal limits, no S3, no S4: no clicks, no rubs, no murmurs ABD:  Soft, nontender. BS +, no masses or bruits. No hepatomegaly, no splenomegaly EXT:  2 + pulses throughout, no edema, no cyanosis no clubbing SKIN:  Warm and dry.  No rashes NEURO:  Alert and oriented x 3. Cranial nerves II through XII intact. PSYCH:  Cognitively intact  LABORATORY DATA: Lab Results  Component Value Date   WBC 8.6 05/29/2019   HGB 14.3 05/29/2019   HCT 42.3 05/29/2019   PLT 185 05/29/2019   GLUCOSE 96 05/29/2019   CHOL 90 (L) 01/13/2017   TRIG 138 01/13/2017   HDL 29 (L) 01/13/2017   LDLCALC 33 01/13/2017   ALT 20 01/13/2017   AST 26 01/13/2017   NA 135 05/29/2019   K 4.0 05/29/2019   CL 100 05/29/2019   CREATININE 1.20 05/29/2019   BUN 28 (H) 05/29/2019   CO2 25 04/14/2017   TSH 1.640 08/07/2017   INR 0.96 04/14/2017   Labs dated 12/26/16: A1c  5.8%, Hgb 14.2, creatinine 1.21. TSH normal.  Dated 07/07/18: A1c 5.6%. creatinine 1.34. potassium 5.5. TSH normal. CBC normal. Dated 01/04/19: A1c 5.7% Dated 06/03/19: creatinine 1.32. CMET and CBC normal.   Ecg today shows Afib  rate 99with LAD. I have personally reviewed and interpreted this study.  Echo 07/09/18: IMPRESSIONS    1. The left ventricle has normal systolic function of 0000000. The cavity size was normal. There is no increased left ventricular wall thickness. Left ventricular diastology could not be evaluated secondary to atrial fibrillation.  2. The right ventricle has normal systolic function. The cavity was mildly enlarged. There is no increase in right ventricular wall thickness.  3. Left atrial size was moderately dilated.  4. The mitral valve is normal in structure.  5. The tricuspid valve is normal in structure.  6. The aortic valve is normal in structure. Aortic valve regurgitation is mild by color flow Doppler.  7. The pulmonic valve was normal in structure.  8. There is mild dilatation of the ascending aorta.  FINDINGS  Left Ventricle: The left ventricle has normal systolic function of 0000000. The cavity size was normal. There is no increased left ventricular wall thickness. Left ventricular diastology could not be evaluated secondary to atrial fibrillation. Right Ventricle: The right ventricle has normal systolic function. The cavity was mildly enlarged. There is no increase in right ventricular wall thickness. Left Atrium: left atrial size was moderately dilated Right Atrium: right atrial size was normal in size Interatrial Septum: No atrial level shunt detected by color flow Doppler.  Assessment / Plan: 1. Atrial fibrillation- persistent and now permanent.  He is completely asymptomatic. Rate controlled on low dose Coreg. Now on Eliquis for anticoagulation. [place] Vasc of 2. Recommend he minimize use of NSAIDs. Given lack  of symptoms will continue with a rate  control strategy.   2. Mixed hyperlipidemia. Now on Crestor and Zetia and tolerating well. Recommend he have a lipid panel with next lab draw  3. HTN- controlled. Continue  lisinopril.   4. Normal cardiac cath 2009.  I will follow up in 6 months.

## 2019-09-15 ENCOUNTER — Ambulatory Visit: Payer: Medicare Other | Admitting: Cardiology

## 2019-09-15 ENCOUNTER — Other Ambulatory Visit: Payer: Self-pay

## 2019-09-15 ENCOUNTER — Encounter: Payer: Self-pay | Admitting: Cardiology

## 2019-09-15 VITALS — BP 132/83 | HR 79 | Temp 96.4°F | Resp 20 | Ht 70.0 in | Wt 252.8 lb

## 2019-09-15 DIAGNOSIS — I10 Essential (primary) hypertension: Secondary | ICD-10-CM

## 2019-09-15 DIAGNOSIS — I4821 Permanent atrial fibrillation: Secondary | ICD-10-CM | POA: Diagnosis not present

## 2019-09-15 DIAGNOSIS — E782 Mixed hyperlipidemia: Secondary | ICD-10-CM | POA: Diagnosis not present

## 2019-10-03 ENCOUNTER — Encounter: Payer: Self-pay | Admitting: Neurology

## 2019-10-04 ENCOUNTER — Telehealth: Payer: Self-pay | Admitting: Neurology

## 2019-10-04 NOTE — Telephone Encounter (Signed)
Hi, [name], I have an appointment with you on July 13 for a six month followup, but I am trying to see you or Dr. Evelena Leyden sooner. 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 It causes itching, burning and pain on the thigh, triggering hard scratching.  This pressing on the nerve is usually caused by a hip problem.  A week after left hip surgery in 2018, I felt a pop and pain, and went to see Dr Rhona Raider, who did the hip. I showed him how I could turn my left foot 180 degrees without any pain. It didn't hurt at all but he yelled at me to [place]. Then he x-rayed it and said the hip joint was still connected to the butt bone so it was good. Before I go back to him for treatment, I would like a neuro consult to see if you agree. I just want to head off any worse problem. Thanks, [name]   Please give him a follow up visit with me earlier for new symptoms

## 2019-10-04 NOTE — Telephone Encounter (Signed)
He has been moved to Dr. Rhea Belton schedule. He is coming to the office on 10/07/19.

## 2019-10-05 ENCOUNTER — Ambulatory Visit: Payer: Medicare Other | Admitting: Neurology

## 2019-10-07 ENCOUNTER — Ambulatory Visit: Payer: Medicare Other | Admitting: Neurology

## 2019-10-07 ENCOUNTER — Encounter: Payer: Self-pay | Admitting: Neurology

## 2019-10-07 ENCOUNTER — Other Ambulatory Visit: Payer: Self-pay

## 2019-10-07 VITALS — BP 123/82 | HR 90 | Temp 97.5°F | Ht 70.0 in | Wt 250.5 lb

## 2019-10-07 DIAGNOSIS — R269 Unspecified abnormalities of gait and mobility: Secondary | ICD-10-CM | POA: Diagnosis not present

## 2019-10-07 DIAGNOSIS — R202 Paresthesia of skin: Secondary | ICD-10-CM

## 2019-10-07 DIAGNOSIS — G2581 Restless legs syndrome: Secondary | ICD-10-CM | POA: Diagnosis not present

## 2019-10-07 NOTE — Progress Notes (Signed)
PATIENT: [name] DOB: [birth year]  REASON FOR VISIT: follow up HISTORY FROM: patient  HISTORY OF PRESENT ILLNESS: [name] is a 79 yo RH male, accompanied by his wife, referred by Dr. Jacelyn Grip, and his primary care physician Dr. Leonides Schanz for evaluation of chronic low back pain, bilateral lower extremity muscle ache He had a past medical history of hypertension, hyperlipidemia, presenting with chronic low back pain, bilateral lower extremity muscle achy pain since 2010.  Initially it was thought due to his statin treatment, he has tried different statin without improving his symptoms,over the years, he also received multiple lumbar bilateral facet joint fluoroscopy guided injection without improving his symptoms.  In 2012, he was diagnosed with L4 and 5 lumbar stenosis, had lumbar laminectomy L3-4, L4-L5 by Dr. [name] in July 2012,without improving his symptoms, He was seen by different specialists, tried different medications, this including Elavil, Xanax as needed, he is currently taking hydrocodone 5/325 mg 1-2 tablets every night, Xanax 0.25 milligram as needed, Flexeril 10 mg every night, gabapentin 600 mg every night without significant improvement, During the daytime, he denies significant low back pain, or bilateral lower extremity pain, he does has history of right ankle fracture, mild gait difficulty due to right ankle pain, but starting at evening time, he began to noticed deep achy pain starting from midline low back, radiating along bilateral lateral leg, constant, he tends to pace around, has difficulty sleeping at nighttime, sometimes he has to got up take a hot bath couple times every night, put ice on his back, on his feet, only provide temporary relief. He has also tried acupuncture, massage, TENS unit, without significant improvement, previously, he has a short trial of low-dose Requip, without improving his symptoms, no significant side effect noticed either. He has  bilateral lateral 3 toes numbness, no weakness, no bilateral fingertips numbness, or weakness Per record, electrodiagnostic study failed to demonstrate significant etiology, We have reviewed MRI lumbar in 2012, there was evidence of L4-5 spinal stenosis, and the most recent MRI Lumbar was in June 2014,progression of multilevel facet arthropathy, status post a laminectomy at L3-4, with residual large synovial cyst extending along the posterior epidural space without significant central canal stenosis, mild foraminal narrowing bilaterally at L3 and 4, progression of mild lateral recess narrowing bilaterally at L4-5, foraminal narrowing is stable at this level MRI of the brain in June 2014 showed mild atrophy, no acute intracranial abnormality  UPDATE Dec 15th 2015:  He is tolerating gabapentin 300 mg 2 tablets every night, Requip 1 mg 2 tablets every night, he can sleep better with the medications, but continue complains of bilateral lower extremity dull achy pain, numbness, urgency to move, especially at nighttime, He also complains of slow progressive gait difficulty, grasps things for support, he has urinary urgency, but no incontinence.  UPDATE Jan 29th 2016: He is now taking gabapentin 300mg  2 tabs qhs and requip 1mg , 3 tabs qhs, which has been very helpful, 30%, improvement, he still has bilatarel leg pain at evening, stretching,massage, He denies bowel and bladder incontinence , he has tinlging at the soles of his feet.   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  He continue complaining significant difficulty at  nighttime, came in with a full page list of symptoms, in the  morning time, he denies significant back pain, no low back pain, symptoms started in the late afternoon, gradually building up, around 9: to10 PM, he noticed achy feeling down lateral side of both leg, increased by sitting still, intense urge to lie on the floor, stretching muscle backwards, late night hot soaking bath to help him sleep, he has to take about 3 AM last night, difficulty sleeping, excessive fatigue during the daytime, could not sit through movies, standing up in the aisle 2/3 of the movie He is now taking, hydrocodone 5/325mg  2 tabs 9pm, advil 200mg  2 tab   He has quit taking Requip 1 mg, up to 4 tablets every night, also gabapentin, complains of upset stomach, without relieving his symptoms, he is now taking Advil 200 mg 2 tablets as needed, hydrocodone/Tylenol 5/325 mg 2 tablets every night for symptoms control, which only mild, temporarily, Tylenol helps some too.  UPDATE April 29th 2016:Laboratory showed low iron, ferritin level at 9, he was put on iron supplement, his restless leg symptoms has much improved, He is now taking clonazepam 0.5 milligram every night, Lyrica 50 mg every night, he can sleep much better,  He continue, combat right ankle pain  UPDATE August 2nd 2016: He sleeps well, he is now taking clonazepam 0.5mg  qhs, Lyrica 50mg  3 tabs qhs, no longer on Hydrocodone, he complains of side effect  UPDATE August 06 2016: I reviewed operation record on April 18 2016, he had right lumbar L3-4, L4-5 anterolateral lumbar interbody fusion, percutaneous pedicle screw on April 18 2016, which has helped his left-sided low back pain radiating pain to left lower extremity, 4. Of time he was almost pain-free, unfortunately he fell landed on his right side, is then he began to experience excruciating radiating pain from left side to left anterior thigh.  We have personally reviewed and compared to MRI lumbar in October 2017, and in February 2018. Which showed pedicle  screw and interbody fusion at L3-4, L4-5 without stenosis, small fluid collection posterior to the thecal sac at L3-4, moderate right foraminal narrowing, mild foraminal narrowing at L4-5.  He is planning on to have second left-sided lumbar decompression surgery on August 15 2016  His restless leg symptoms is under good control with current dose of Lyrica 100 mg twice a day, clonazepam as needed, oxycodone 5 mg every night  His most recent ferritin level was 47, hemoglobin was 11 point 5,  UPDATE August 07 2017: He is accompanied by his wife at today's clinical visit, complaining worsening restless leg symptoms, for a while, his restless leg symptoms seems to be under okay control.  Since 2017, he had multiple surgery, lumbar fusion by Dr. Birdena Crandall November 2017, redo in March 2018, left hip replacement in November 2018,  He has baseline gait abnormality, but no longer have significant pain, at nighttime, 1-2 hours after lying down, he has uncontrollable right leg muscle jerking, urged to move,  He is now taking Lyrica 100 mg 2 tablets at nighttime, clonazepam 0.5 mg every night, often have to take 1-1/2 tablets of Percocet 5/325 mg early morning time if he could not sleep his combination medication treatment.  Sometimes he has to take hot bath in the middle of the night to help him go to sleep.  UPDATE Sept 9 2019: Now he is taking generic Lyric 100mg  3 tabs qhs, Requip 0.5 mg every night,  20% of time he is able to  sleep without any difficulties, but 80% of the time about 1 to 2 hours after he goes to bed, he has severe leg discomfort, has to take a hot bath, then was able to sleep the rest of the night, sometimes he took clonazepam 0.5 mg half to 1 tablets before bedtime, complains of excessive drowsiness even to the point of wetting his bed,   His ferritin level is low 15 ng/ml March 2019, last colonoscopy was in 2017, hemoglobin in November 2018 prior to his left hip surgery was 13,  he is now on iron supplement  UPDATE August 10 2018: Ferritin in Sept 2019 was 49.  He continues to have restless leg symptoms, halftime in a week, he has trouble sleeping due to restless leg symptoms, to get up to take a hot shower, which usually is very helpful, is taking Lyrica 100 in the morning, 200 mg at 2 hours prior to sleep, also take iron supplement,  UPDATE August 11 2019: He has lost few dear family member over short period of time, is feeling sad.  Around February 2021, he felt like his restless leg symptoms is under good control, decided to taper down Lyrica doses, instead of 100 mg 3 tablets every night, he tapered down to 100 mg every night, gradually increased difficulty sleeping, difficult to find a comfortable position for his leg, take hot bath, clonazepam 0.5 mg as needed was helpful last night, he also takes Cymbalta 60 mg daily, Requip 0.5 mg at bedtime  Laboratory evaluations in December 2020, normal CBC hemoglobin 14.3, CMP, creatinine of 1.2, ferritin was 15 August 2017, after iron supplement, improved to 49  CT abdomen in December 2020 showed no acute abnormality, large paraesophageal hiatal hernia, with approximately one half of the stomach present in the chest, diffuse colonic diverticulosis, without evidence of acute diverticulitis, mild diffuse hepatic steatosis, marked prostate gland enlargement, with enhancing nodule arising from the superior portion of the median lobe of the prostate gland.  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 based on symptoms alone  without looking at his skin lesion,  He denies a previous history of shingles, had a Shingrix vaccination end of 2020, did reported history of left upper thoracic rash, itching few months ago  His restless leg symptoms is under good control with current regimen, Lyrica 100 mg 3 tablets every night, Requip 0.5 mg every night, Cymbalta 60 mg every morning, clonazepam 0.5 mg as needed  He has history of chronic low back pain, more to left side, had a history of lumbar decompression surgery in the past, left hip replacement in 2018, did have left hip injury within a week of replacement surgery, pain has gradually improved, also had a history of right ankle surgery, had gait abnormality.   REVIEW OF SYSTEMS: Out of a complete 14 system review of symptoms, the patient complains only of the following symptoms, and all other reviewed systems are negative.  As above  ALLERGIES: Allergies  Allergen Reactions  . Compazine [Prochlorperazine Edisylate] Other (See Comments)    EXTRAPYRAMIDAL MOVEMENT [Involuntary muscle movement]  . Penicillins Nausea And Vomiting, Rash and  Other (See Comments)    Has patient had a PCN reaction causing immediate rash, facial/tongue/throat swelling, SOB or lightheadedness with hypotension: #  #  #  YES  #  #  #  Has patient had a PCN reaction causing severe rash involving mucus membranes or skin necrosis: No Has patient had a PCN reaction that required hospitalization No Has patient had a PCN reaction occurring within the last 10 years: No If all of the above answers are "NO", then may proceed with Cephalosporin use.   [name]] Other (See Comments)    MYALGIAS [Leg aches]  . Gemfibrozil Nausea Only    PATIENT TOLERATES  . Red Yeast Rice [Cholestin] Other (See Comments)    flushing    HOME MEDICATIONS: Outpatient Medications Prior to Visit  Medication Sig Dispense Refill  . carvedilol (COREG) 3.125 MG tablet Take 3.125 mg by mouth at bedtime.      . clonazePAM (KLONOPIN) 0.5 MG tablet Take 0.5 to 1 tablet at bedtime as needed for RLS. 30 tablet 5  . DULoxetine (CYMBALTA) 60 MG capsule Take 1 capsule (60 mg total) by mouth daily. 90 capsule 4  . ELIQUIS 5 MG TABS tablet Take 1 tablet (5 mg total) by mouth 2 (two) times daily. 180 tablet 1  . ezetimibe (ZETIA) 10 MG tablet Take 1 tablet (10 mg total) by mouth daily. 30 tablet 6  . ferrous sulfate 324 (65 Fe) MG TBEC Take 1 tablet by mouth daily.    Marland Kitchen ibuprofen (ADVIL) 200 MG tablet Take 200 mg by mouth as needed (back pain).    Marland Kitchen lisinopril (PRINIVIL,ZESTRIL) 10 MG tablet Take 5 mg by mouth at bedtime.     . methocarbamol (ROBAXIN) 500 MG tablet Take 500 mg by mouth at bedtime as needed for muscle spasms.     . Multiple Vitamins-Minerals (MULTIVITAMIN PO) Take 1 tablet by mouth daily.    . nitroGLYCERIN (NITROSTAT) 0.4 MG SL tablet Place 0.4 mg under the tongue every 5 (five) minutes as needed for chest pain.    . polyethylene glycol (MIRALAX / GLYCOLAX) packet Take 17 g by mouth daily as needed for mild constipation.    . pregabalin (LYRICA) 100 MG capsule Take 3 capsules (300 mg total) by mouth at bedtime. 270 capsule 5  . rOPINIRole (REQUIP) 0.5 MG tablet Take 1 tablet (0.5 mg total) by mouth at bedtime. 30 tablet 11  . rosuvastatin (CRESTOR) 10 MG tablet TAKE 1 TABLET ONCE DAILY OR AS DIRECTED. 90 tablet 2  . tamsulosin (FLOMAX) 0.4 MG CAPS capsule Take 0.4 mg by mouth daily.     No facility-administered medications prior to visit.    PAST MEDICAL HISTORY: Past Medical History:  Diagnosis Date  . Anginal pain (HCC)    NONE IN 3 YEARS  . Arthritis   . Atrial fibrillation ([place]) 1962   a. No recurrence since 1962.  . Chest pain    a. 1998 Cath: nl cors;  b. 2009 Cath: nl cors.  . Chronic kidney disease    stage 3  . Depression   . Dysrhythmia    NO TROUBLE IN 1 YR    DR. PETER [place]   . Esophageal reflux   . Essential hypertension   . History of Paroxysmal atrial flutter  ([place])   . Hyperlipidemia   . Incontinence of urine   . Leg pain, bilateral   . Nocturia   . PONV (postoperative nausea and vomiting)    CONSTIPATED  .  Scarlet fever 1945  . Spinal stenosis   . Staph skin infection    LEFT GROIN  04/11/16  TX W/ DOXYCYCLINE  . Ulcerative proctitis ([place])     PAST SURGICAL HISTORY: Past Surgical History:  Procedure Laterality Date  . ANKLE RECONSTRUCTION Right 2007   [place]  . ANTERIOR LAT LUMBAR FUSION Right 04/18/2016   Procedure: RIGHT LUMBAR THREE-FOUR, LUMBAR FOUR-FIVE ANTEROLATERAL LUMBAR INTERBODY FUSION;  Surgeon: Erline Levine, MD;  Location: [facility];  Service: Neurosurgery;  Laterality: Right;  RIGHT L3-4 L4-5 ANTEROLATERAL LUMBAR INTERBODY FUSION  . CARDIAC CATHETERIZATION  03/03/2008   Left heart cardiac catheterization and coronary  . CARDIAC CATHETERIZATION  1997   Dr Wynonia Lawman  . CATARACT EXTRACTION W/ INTRAOCULAR LENS  IMPLANT, BILATERAL     2016  . ESOPHAGEAL DILATION     2008  . INNER EAR SURGERY     EAR INJECTION FOR MENIERES  . KNEE ARTHROSCOPY  1991, 1995  . LUMBAR LAMINECTOMY  12/2010  . LUMBAR LAMINECTOMY/DECOMPRESSION MICRODISCECTOMY Left 08/15/2016   Procedure: Left Left three- four Redo laminectomy;  Surgeon: Erline Levine, MD;  Location: [facility];  Service: Neurosurgery;  Laterality: Left;  Left L3-4 Redo laminectomy  . LUMBAR PERCUTANEOUS PEDICLE SCREW 2 LEVEL N/A 04/18/2016   Procedure: LUMBAR THREE-FOUR, LUMBAR FOUR-FIVE PERCUTANEOUS PEDICLE SCREW;  Surgeon: Erline Levine, MD;  Location: [facility];  Service: Neurosurgery;  Laterality: N/A;  . 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 TOTAL HIP ARTHROPLASTY Left 04/22/2017   Procedure: TOTAL HIP ARTHROPLASTY ANTERIOR APPROACH;  Surgeon: Melrose Nakayama, MD;  Location: [facility];  Service: Orthopedics;  Laterality: Left;    FAMILY HISTORY: Family History  Problem Relation Age of Onset  . Prostate cancer Father   . Heart attack Brother   . Kidney cancer Brother   . Brain cancer  Brother     SOCIAL HISTORY: Social History   Socioeconomic History  . Marital status: Married    Spouse name: Bethena Roys  . Number of children: 3  . Years of education: college  . Highest education level: Not on file  Occupational History    Comment: Retired   Tobacco Use  . Smoking status: Current Some Day Smoker    Types: Pipe  . Smokeless tobacco: Never Used  Substance and Sexual Activity  . Alcohol use: Yes    Alcohol/week: 1.0 standard drinks    Types: 1 Shots of liquor per week    Comment: 1-3 weekly  . Drug use: No  . Sexual activity: Not on file  Other Topics Concern  . Not on file  Social History Narrative   Patient is retired and lives at home with his wife Bethena Roys.    Education college.   Caffeine one cup daily.   Social Determinants of Health   Financial Resource Strain:   . Difficulty of Paying Living Expenses:   Food Insecurity:   . Worried About Charity fundraiser in the Last Year:   . Arboriculturist in the Last Year:   Transportation Needs:   . Film/video editor (Medical):   Marland Kitchen Lack of Transportation (Non-Medical):   Physical Activity:   . Days of Exercise per Week:   . Minutes of Exercise per Session:   Stress:   . Feeling of Stress :   Social Connections:   . Frequency of Communication with Friends and Family:   . Frequency of Social Gatherings with Friends and Family:   . Attends Religious Services:   . Active  Member of Clubs or Organizations:   . Attends Archivist Meetings:   Marland Kitchen Marital Status:   Intimate Partner Violence:   . Fear of Current or Ex-Partner:   . Emotionally Abused:   Marland Kitchen Physically Abused:   . Sexually Abused:     PHYSICAL EXAM  Vitals:   10/07/19 1245  BP: 123/82  Pulse: 90  Temp: (!) 97.5 F (36.4 C)  Weight: 250 lb 8 oz (113.6 kg)  Height: 5\' 10"  (1.778 m)   Body mass index is 35.94 kg/m.  Generalized: Well developed, sad looking elderly Skin: Skin rash at left lateral thigh area, with open top,  clear secretion, erythematous base, also a bigger patch of red scaly dry skin,  NEUROLOGICAL EXAM:  MENTAL STATUS: Speech/Cognition: Awake, alert, normal speech, oriented to history taking and casual conversation.  CRANIAL NERVES: CN II: Visual fields are full to confrontation.  Pupils are round equal and briskly reactive to light. CN III, IV, VI: extraocular movement are normal. No ptosis. CN V: Facial sensation is intact to light touch. CN VII: Face is symmetric with normal eye closure and smile. CN VIII: Hearing is normal to casual conversation CN IX, X: Palate elevates symmetrically. Phonation is normal. CN XI: Head turning and shoulder shrug are intact CN XII: Tongue is midline with normal movements and no atrophy.  MOTOR: Muscle bulk and tone are normal. Muscle strength is normal.  Range of motion of right ankle  REFLEXES: Reflexes are 1 and symmetric at the biceps, triceps, knees and ankles. Plantar responses are flexor.  SENSORY: Bilateral lower extremity pitting edema, length dependent decreased vibratory sensation to knee level  COORDINATION: There is no trunk or limb ataxia.    GAIT/STANCE: He needs push-up to get up from seated position, antalgic, unsteady, mild lordotic posturing  DIAGNOSTIC DATA (LABS, IMAGING, TESTING) - I reviewed patient records, labs, notes, testing and imaging myself where available.  Lab Results  Component Value Date   WBC 8.6 05/29/2019   HGB 14.3 05/29/2019   HCT 42.3 05/29/2019   MCV 97.9 05/29/2019   PLT 185 05/29/2019      Component Value Date/Time   NA 135 05/29/2019 1432   NA 138 04/29/2016 0000   K 4.0 05/29/2019 1432   CL 100 05/29/2019 1432   CO2 25 04/14/2017 1000   GLUCOSE 96 05/29/2019 1432   BUN 28 (H) 05/29/2019 1432   BUN 19 04/29/2016 0000   CREATININE 1.20 05/29/2019 1432   CALCIUM 8.8 (L) 04/14/2017 1000   PROT 6.3 01/13/2017 0846   ALBUMIN 4.4 01/13/2017 0846   AST 26 01/13/2017 0846   ALT 20  01/13/2017 0846   ALKPHOS 79 01/13/2017 0846   BILITOT 0.4 01/13/2017 0846   GFRNONAA 51 (L) 04/14/2017 1000   GFRAA 59 (L) 04/14/2017 1000   Lab Results  Component Value Date   CHOL 90 (L) 01/13/2017   HDL 29 (L) 01/13/2017   LDLCALC 33 01/13/2017   TRIG 138 01/13/2017   CHOLHDL 3.8 01/18/2016   No results found for: HGBA1C No results found for: VITAMINB12 Lab Results  Component Value Date   TSH 1.640 08/07/2017    ASSESSMENT AND PLAN 79 y.o. year old male   Restless leg syndrome  Is under better control now taking 100 mg 3 tablets every night  Cymbalta 60 mg daily  Requip 0.5 mg every night  Clonazepam 0.5 mg half to 1 tablet as needed, Lateral thigh area itching, paresthesia, rash broke out  History  actually does not support a diagnosis of meralgia paresthetica, I have suggested him follow-up with dermatologist,  Marcial Pacas, M.D. Ph.D.  [facility] [place], [place] [address] Phone: [phone] Fax:      [phone]

## 2019-12-06 ENCOUNTER — Other Ambulatory Visit: Payer: Self-pay | Admitting: Neurology

## 2019-12-06 ENCOUNTER — Other Ambulatory Visit: Payer: Self-pay | Admitting: Cardiology

## 2019-12-07 NOTE — Telephone Encounter (Signed)
79 M 113.6 kg SCR 1.2 (12/20) LOV [place] 4/21

## 2019-12-14 ENCOUNTER — Ambulatory Visit: Payer: Medicare Other | Admitting: Neurology

## 2020-01-18 ENCOUNTER — Ambulatory Visit: Payer: Medicare Other

## 2020-03-16 ENCOUNTER — Other Ambulatory Visit: Payer: Self-pay | Admitting: Neurology

## 2020-04-17 ENCOUNTER — Other Ambulatory Visit: Payer: Self-pay

## 2020-04-17 ENCOUNTER — Encounter: Payer: Self-pay | Admitting: Neurology

## 2020-04-17 ENCOUNTER — Ambulatory Visit: Payer: Medicare Other | Admitting: Neurology

## 2020-04-17 VITALS — BP 116/70 | HR 81 | Ht 70.0 in | Wt 249.2 lb

## 2020-04-17 DIAGNOSIS — G2581 Restless legs syndrome: Secondary | ICD-10-CM

## 2020-04-17 MED ORDER — DULOXETINE HCL 60 MG PO CPEP: 60.0000 mg | ORAL_CAPSULE | Freq: Every day | ORAL | 4 refills | Status: DC

## 2020-04-17 MED ORDER — ROPINIROLE HCL 0.5 MG PO TABS: 0.5000 mg | ORAL_TABLET | Freq: Every day | ORAL | 11 refills | Status: DC

## 2020-04-17 NOTE — Patient Instructions (Addendum)
Continue current medications  Will check ferritin level and CBC See you back in 8 months

## 2020-04-17 NOTE — Telephone Encounter (Signed)
For now they want to continue ICD therapy in the event she has a malignant arrhythmia. If her condition deteriorates they may opt to turn ICD therapies off or may want to limit programmed shocks.  Eliane Hammersmith [place] MD, [facility]

## 2020-04-17 NOTE — Progress Notes (Signed)
PATIENT: [name] DOB: 1940/11/08  REASON FOR VISIT: follow up Andrew FROM: patient  Andrew OF PRESENT ILLNESS: Today 04/17/20  [name] is a 79 yo RH male, accompanied by his wife, referred by Dr. Jacelyn Grip, and his primary care physician Dr. Leonides Schanz for evaluation of chronic low back pain, bilateral lower extremity muscle ache He had a past medical Andrew of hypertension, hyperlipidemia, presenting with chronic low back pain, bilateral lower extremity muscle achy pain since 2010.  Initially it was thought due to his statin treatment, he has tried different statin without improving his symptoms,over the years, he also received multiple lumbar bilateral facet joint fluoroscopy guided injection without improving his symptoms.  In 2012, he was diagnosed with L4 and 5 lumbar stenosis, had lumbar laminectomy L3-4, L4-L5 by Dr. [name] in July 2012,without improving his symptoms, He was seen by different specialists, tried different medications, this including Elavil, Xanax as needed, he is currently taking hydrocodone 5/325 mg 1-2 tablets every night, Xanax 0.25 milligram as needed, Flexeril 10 mg every night, gabapentin 600 mg every night without significant improvement, During the daytime, he denies significant low back pain, or bilateral lower extremity pain, he does has Andrew of right ankle fracture, mild gait difficulty due to right ankle pain, but starting at evening time, he began to noticed deep achy pain starting from midline low back, radiating along bilateral lateral leg, constant, he tends to pace around, has difficulty sleeping at nighttime, sometimes he has to got up take a hot bath couple times every night, put ice on his back, on his feet, only provide temporary relief. He has also tried acupuncture, massage, TENS unit, without significant improvement, previously, he has a short trial of low-dose Requip, without improving his symptoms, no significant side effect  noticed either. He has bilateral lateral 3 toes numbness, no weakness, no bilateral fingertips numbness, or weakness Per record, electrodiagnostic study failed to demonstrate significant etiology, We have reviewed MRI lumbar in 2012, there was evidence of L4-5 spinal stenosis, and the most recent MRI Lumbar was in June 2014,progression of multilevel facet arthropathy, status post a laminectomy at L3-4, with residual large synovial cyst extending along the posterior epidural space without significant central canal stenosis, mild foraminal narrowing bilaterally at L3 and 4, progression of mild lateral recess narrowing bilaterally at L4-5, foraminal narrowing is stable at this level MRI of the brain in June 2014 showed mild atrophy, no acute intracranial abnormality  UPDATE Dec 15th 2015:  He is tolerating gabapentin 300 mg 2 tablets every night, Requip 1 mg 2 tablets every night, he can sleep better with the medications, but continue complains of bilateral lower extremity dull achy pain, numbness, urgency to move, especially at nighttime, He also complains of slow progressive gait difficulty, grasps things for support, he has urinary urgency, but no incontinence.  UPDATE Jan 29th 2016: He is now taking gabapentin 300mg  2 tabs qhs and requip 1mg , 3 tabs qhs, which has been very helpful, 30%, improvement, he still has bilatarel leg pain at evening, stretching,massage, He denies bowel and bladder incontinence , he has tinlging at the soles of his feet.   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  He continue  complaining significant difficulty at nighttime, came in with a full page  list of symptoms, in the morning time, he denies significant back pain, no low back pain, symptoms started in the late afternoon, gradually building up, around 9: to10 PM, he noticed achy feeling down lateral side of both leg, increased by sitting still, intense urge to lie on the floor, stretching muscle backwards, late night hot soaking bath to help him sleep, he has to take about 3 AM last night, difficulty sleeping, excessive fatigue during the daytime, could not sit through movies, standing up in the aisle 2/3 of the movie He is now taking, hydrocodone 5/325mg  2 tabs 9pm, advil 200mg  2 tab   He has quit taking Requip 1 mg, up to 4 tablets every night, also gabapentin, complains of upset stomach, without relieving his symptoms, he is now taking Advil 200 mg 2 tablets as needed, hydrocodone/Tylenol 5/325 mg 2 tablets every night for symptoms control, which only mild, temporarily, Tylenol helps some too.  UPDATE April 29th 2016:Laboratory showed low iron, ferritin level at 9, he was put on iron supplement, his restless leg symptoms has much improved, He is now taking clonazepam 0.5 milligram every night, Lyrica 50 mg every night, he can sleep much better,  He continue, combat right ankle pain  UPDATE August 2nd 2016: He sleeps well, he is now taking clonazepam 0.5mg  qhs, Lyrica 50mg  3 tabs qhs, no longer on Hydrocodone, he complains of side effect  UPDATE August 06 2016: I reviewed operation record on April 18 2016, he had right lumbar L3-4, L4-5 anterolateral lumbar interbody fusion, percutaneous pedicle screw on April 18 2016, which has helped his left-sided low back pain radiating pain to left lower extremity, 4. Of time he was almost pain-free, unfortunately he fell landed on his right side, is then he began to experience excruciating radiating pain from left side to left anterior thigh.  We have personally reviewed and compared to MRI lumbar in October 2017, and in February 2018.  Which showed pedicle screw and interbody fusion at L3-4, L4-5 without stenosis, small fluid collection posterior to the thecal sac at L3-4, moderate right foraminal narrowing, mild foraminal narrowing at L4-5.  He is planning on to have second left-sided lumbar decompression surgery on August 15 2016  His restless leg symptoms is under good control with current dose of Lyrica 100 mg twice a day, clonazepam as needed, oxycodone 5 mg every night  His most recent ferritin level was 47, hemoglobin was 11 point 5,  UPDATE August 07 2017: He is accompanied by his wife at today's clinical visit, complaining worsening restless leg symptoms, for a while, his restless leg symptoms seems to be under okay control.  Since 2017, he had multiple surgery, lumbar fusion by Dr. Birdena Crandall November 2017, redo in March 2018, left hip replacement in November 2018,  He has baseline gait abnormality, but no longer have significant pain, at nighttime, 1-2 hours after lying down, he has uncontrollable right leg muscle jerking, urged to move,  He is now taking Lyrica 100 mg 2 tablets at nighttime, clonazepam 0.5 mg every night, often have to take 1-1/2 tablets of Percocet 5/325 mg early morning time if he could not sleep his combination medication treatment.Sometimes he has to take hot bath in the middle of the night to help him go to sleep.  UPDATE Sept 9 2019: Now he is taking generic Lyric 100mg  3 tabs qhs, Requip 0.5 mg every night, 20% of time he is able  to sleep without any difficulties, but 80% of the time about 1 to 2 hours after he goes to bed, he has severe leg discomfort, has to take a hot bath, then was able to sleep the rest of the night, sometimes he took clonazepam 0.5 mg half to 1 tablets before bedtime, complains of excessive drowsiness even to the point of wetting his bed,   His ferritin level is low 15 ng/ml March 2019, last colonoscopy was in 2017, hemoglobin in November 2018 prior to his left  hip surgery was 13, he is now on iron supplement  UPDATE August 10 2018: Ferritin in Sept 2019 was 49.  He continues to have restless leg symptoms,halftime in a week, he has trouble sleeping due to restless leg symptoms, to get up to take a hot shower, which usually is very helpful, is taking Lyrica 100 in the morning, 200 mg at 2 hours prior to sleep, also take iron supplement,  UPDATE August 11 2019: He has lost few dear family member over short period of time, is feeling sad.  Around February 2021, he felt like his restless leg symptoms is under good control, decided to taper down Lyrica doses, instead of 100 mg 3 tablets every night, he tapered down to 100 mg every night, gradually increased difficulty sleeping, difficult to find a comfortable position for his leg, take hot bath, clonazepam 0.5 mg as needed was helpful last night, he also takes Cymbalta 60 mg daily, Requip 0.5 mg at bedtime  Laboratory evaluations in December 2020, normal CBC hemoglobin 14.3, CMP, creatinine of 1.2, ferritin was 15 August 2017, after iron supplement, improved to 49  CT abdomen in December 2020 showed no acute abnormality, large paraesophageal hiatal hernia, with approximately one half of the stomach present in the chest, diffuse colonic diverticulosis, without evidence of acute diverticulitis, mild diffuse hepatic steatosis, marked prostate gland enlargement, with enhancing nodule arising from the superior portion of the median lobe of the prostate gland.  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  based on symptoms alone without looking at his skin lesion,  He denies a previous Andrew of shingles, had a Shingrix vaccination end of 2020, did reported Andrew of left upper thoracic rash, itching few months ago  His restless leg symptoms is under good control with current regimen, Lyrica 100 mg 3 tablets every night, Requip 0.5 mg every night, Cymbalta 60 mg every morning, clonazepam 0.5 mg as needed  He has Andrew of chronic low back pain, more to left side, had a Andrew of lumbar decompression surgery in the past, left hip replacement in 2018, did have left hip injury within a week of replacement surgery, pain has gradually improved, also had a Andrew of right ankle surgery, had gait abnormality.  Update April 17, 2020 SS: Here today unaccompanied, RLS currently well controlled on current regimen: Cymbalta 60 mg daily, Klonopin 0.5 mg at bedtime, Lyrica 100 mg 3 tablets at bedtime, Requip 0.5 mg at bedtime.  He is sleeping well, does not notice RLS symptoms.  His wife has end-stage HF, poor prognosis, a lot of anxiety worried about her.  Has good support around him, several family members are medical.  Rash, pain to the left thigh resolved.  Remains on ferrous sulfate daily.   REVIEW OF SYSTEMS: Out of a complete 14 system review of symptoms, the patient complains only of the following symptoms, and all other reviewed systems are negative.  Restless legs  ALLERGIES: Allergies  Allergen Reactions  . Compazine [Prochlorperazine Edisylate] Other (See Comments)    EXTRAPYRAMIDAL MOVEMENT [Involuntary muscle movement]  . Penicillins Nausea And Vomiting, Rash and Other (See Comments)    Has patient had a PCN reaction causing immediate rash, facial/tongue/throat swelling, SOB or lightheadedness with hypotension: #  #  #  YES  #  #  #  Has patient had a PCN reaction causing severe rash involving mucus membranes or skin necrosis: No Has patient had a PCN reaction that required  hospitalization No Has patient had a PCN reaction occurring within the last 10 years: No If all of the above answers are "NO", then may proceed with Cephalosporin use.   [name]] Other (See Comments)    MYALGIAS [Leg aches]  . Gemfibrozil Nausea Only    PATIENT TOLERATES  . Red Yeast Rice [Cholestin] Other (See Comments)    flushing    HOME MEDICATIONS: Outpatient Medications Prior to Visit  Medication Sig Dispense Refill  . carvedilol (COREG) 3.125 MG tablet Take 3.125 mg by mouth at bedtime.     . clonazePAM (KLONOPIN) 0.5 MG tablet take 1/2-1 TABLET BY MOUTH AT BEDTIME AS NEEDED 30 tablet 5  . diphenhydrAMINE (BENADRYL) 50 MG capsule Take 50 mg by mouth at bedtime as needed.    Marland Kitchen ELIQUIS 5 MG TABS tablet TAKE ONE TABLET BY MOUTH EVERY MORNING and TAKE ONE TABLET BY MOUTH EVERY EVENING 180 tablet 1  . ezetimibe (ZETIA) 10 MG tablet Take 1 tablet (10 mg total) by mouth daily. 30 tablet 6  . ferrous sulfate 324 (65 Fe) MG TBEC Take 1 tablet by mouth daily.    Marland Kitchen ibuprofen (ADVIL) 200 MG tablet Take 200 mg by mouth as needed (back pain).    Marland Kitchen lisinopril (PRINIVIL,ZESTRIL) 10 MG tablet Take 5 mg by mouth at bedtime.     . methocarbamol (ROBAXIN) 500 MG tablet Take 500 mg by mouth at bedtime as needed for muscle spasms.     . Multiple Vitamins-Minerals (MULTIVITAMIN PO) Take 1 tablet by mouth daily.    . nitroGLYCERIN (NITROSTAT) 0.4 MG SL tablet Place 0.4 mg under the tongue every 5 (five) minutes as needed for chest pain.    . polyethylene glycol (MIRALAX / GLYCOLAX) packet Take 17 g by mouth daily as needed for mild constipation.    . pregabalin (LYRICA) 100 MG capsule TAKE THREE CAPSULES BY MOUTH EVERY EVENING 270 capsule 1  . rosuvastatin (CRESTOR) 10 MG tablet TAKE 1 TABLET ONCE DAILY OR AS DIRECTED. 90 tablet 2  . tamsulosin (FLOMAX) 0.4 MG CAPS capsule Take 0.4 mg by mouth daily.    . DULoxetine (CYMBALTA) 60 MG capsule Take 1 capsule (60 mg total) by mouth daily.  90 capsule 4  . rOPINIRole (REQUIP) 0.5 MG tablet Take 1 tablet (0.5 mg total) by mouth at bedtime. 30 tablet 11   No facility-administered medications prior to visit.    PAST MEDICAL Andrew: Past Medical Andrew:  Diagnosis Date  . Anginal pain (HCC)    NONE IN 3 YEARS  . Arthritis   . Atrial fibrillation ([place]) 1962   a. No recurrence since  1962.  . Chest pain    a. 1998 Cath: nl cors;  b. 2009 Cath: nl cors.  . Chronic kidney disease    stage 3  . Depression   . Dysrhythmia    NO TROUBLE IN 1 YR    DR. PETER [place]   . Esophageal reflux   . Essential hypertension   . Andrew of Paroxysmal atrial flutter ([place])   . Hyperlipidemia   . Incontinence of urine   . Leg pain, bilateral   . Nocturia   . PONV (postoperative nausea and vomiting)    CONSTIPATED  . Scarlet fever 1945  . Spinal stenosis   . Staph skin infection    LEFT GROIN  04/11/16  TX W/ DOXYCYCLINE  . Ulcerative proctitis ([place])     PAST SURGICAL Andrew: Past Surgical Andrew:  Procedure Laterality Date  . ANKLE RECONSTRUCTION Right 2007   [place]  . ANTERIOR LAT LUMBAR FUSION Right 04/18/2016   Procedure: RIGHT LUMBAR THREE-FOUR, LUMBAR FOUR-FIVE ANTEROLATERAL LUMBAR INTERBODY FUSION;  Surgeon: Erline Levine, MD;  Location: [facility];  Service: Neurosurgery;  Laterality: Right;  RIGHT L3-4 L4-5 ANTEROLATERAL LUMBAR INTERBODY FUSION  . CARDIAC CATHETERIZATION  03/03/2008   Left heart cardiac catheterization and coronary  . CARDIAC CATHETERIZATION  1997   Dr Wynonia Lawman  . CATARACT EXTRACTION W/ INTRAOCULAR LENS  IMPLANT, BILATERAL     2016  . ESOPHAGEAL DILATION     2008  . INNER EAR SURGERY     EAR INJECTION FOR MENIERES  . KNEE ARTHROSCOPY  1991, 1995  . LUMBAR LAMINECTOMY  12/2010  . LUMBAR LAMINECTOMY/DECOMPRESSION MICRODISCECTOMY Left 08/15/2016   Procedure: Left Left three- four Redo laminectomy;  Surgeon: Erline Levine, MD;  Location: [facility];  Service: Neurosurgery;  Laterality: Left;  Left L3-4 Redo  laminectomy  . LUMBAR PERCUTANEOUS PEDICLE SCREW 2 LEVEL N/A 04/18/2016   Procedure: LUMBAR THREE-FOUR, LUMBAR FOUR-FIVE PERCUTANEOUS PEDICLE SCREW;  Surgeon: Erline Levine, MD;  Location: [facility];  Service: Neurosurgery;  Laterality: N/A;  . 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 TOTAL HIP ARTHROPLASTY Left 04/22/2017   Procedure: TOTAL HIP ARTHROPLASTY ANTERIOR APPROACH;  Surgeon: Melrose Nakayama, MD;  Location: [facility];  Service: Orthopedics;  Laterality: Left;    FAMILY Andrew: Family Andrew  Problem Relation Age of Onset  . Prostate cancer Father   . Heart attack Brother   . Kidney cancer Brother   . Brain cancer Brother     SOCIAL Andrew: Social Andrew   Socioeconomic Andrew  . Marital status: Married    Spouse name: Bethena Roys  . Number of children: 3  . Years of education: college  . Highest education level: Not on file  Occupational Andrew    Comment: Retired   Tobacco Use  . Smoking status: Current Some Day Smoker    Types: Pipe  . Smokeless tobacco: Never Used  Vaping Use  . Vaping Use: Never used  Substance and Sexual Activity  . Alcohol use: Yes    Alcohol/week: 1.0 standard drink    Types: 1 Shots of liquor per week    Comment: 1-3 weekly  . Drug use: No  . Sexual activity: Not on file  Other Topics Concern  . Not on file  Social Andrew Narrative   Patient is retired and lives at home with his wife Bethena Roys.    Education college.   Caffeine one cup daily.   Social Determinants of Health   Financial Resource Strain:   . Difficulty of Paying Living  Expenses: Not on file  Food Insecurity:   . Worried About Charity fundraiser in the Last Year: Not on file  . Ran Out of Food in the Last Year: Not on file  Transportation Needs:   . Lack of Transportation (Medical): Not on file  . Lack of Transportation (Non-Medical): Not on file  Physical Activity:   . Days of Exercise per Week: Not on file  . Minutes of Exercise per Session: Not on file  Stress:   .  Feeling of Stress : Not on file  Social Connections:   . Frequency of Communication with Friends and Family: Not on file  . Frequency of Social Gatherings with Friends and Family: Not on file  . Attends Religious Services: Not on file  . Active Member of Clubs or Organizations: Not on file  . Attends Archivist Meetings: Not on file  . Marital Status: Not on file  Intimate Partner Violence:   . Fear of Current or Ex-Partner: Not on file  . Emotionally Abused: Not on file  . Physically Abused: Not on file  . Sexually Abused: Not on file   PHYSICAL EXAM  Vitals:   04/17/20 1350  BP: 116/70  Pulse: 81  Weight: 249 lb 3.2 oz (113 kg)  Height: 5\' 10"  (1.778 m)   Body mass index is 35.76 kg/m.  Generalized: Well developed, in no acute distress   Neurological examination  Mentation: Alert oriented to time, place, Andrew taking. Follows all commands speech and language fluent Cranial nerve II-XII: Pupils were equal round reactive to light. Extraocular movements were full, visual field were full on confrontational test. Facial sensation and strength were normal. Head turning and shoulder shrug  were normal and symmetric. Motor: Good strength to all extremities, except 4/5 left hip flexion Sensory: Sensory testing is intact to soft touch on all 4 extremities. No evidence of extinction is noted.  Coordination: Cerebellar testing reveals good finger-nose-finger and heel-to-shin bilaterally, but difficulty lifting the left leg Gait and station: Gait is slightly wide-based, cautious, uses single-point cane.  Reflexes: Deep tendon reflexes are symmetric but decreased throughout  DIAGNOSTIC DATA (LABS, IMAGING, TESTING) - I reviewed patient records, labs, notes, testing and imaging myself where available.  Lab Results  Component Value Date   WBC 8.6 05/29/2019   HGB 14.3 05/29/2019   HCT 42.3 05/29/2019   MCV 97.9 05/29/2019   PLT 185 05/29/2019      Component Value  Date/Time   NA 135 05/29/2019 1432   NA 138 04/29/2016 0000   K 4.0 05/29/2019 1432   CL 100 05/29/2019 1432   CO2 25 04/14/2017 1000   GLUCOSE 96 05/29/2019 1432   BUN 28 (H) 05/29/2019 1432   BUN 19 04/29/2016 0000   CREATININE 1.20 05/29/2019 1432   CALCIUM 8.8 (L) 04/14/2017 1000   PROT 6.3 01/13/2017 0846   ALBUMIN 4.4 01/13/2017 0846   AST 26 01/13/2017 0846   ALT 20 01/13/2017 0846   ALKPHOS 79 01/13/2017 0846   BILITOT 0.4 01/13/2017 0846   GFRNONAA 51 (L) 04/14/2017 1000   GFRAA 59 (L) 04/14/2017 1000   Lab Results  Component Value Date   CHOL 90 (L) 01/13/2017   HDL 29 (L) 01/13/2017   LDLCALC 33 01/13/2017   TRIG 138 01/13/2017   CHOLHDL 3.8 01/18/2016   No results found for: HGBA1C No results found for: VITAMINB12 Lab Results  Component Value Date   TSH 1.640 08/07/2017   ASSESSMENT AND PLAN  79 y.o. year old male  has a past medical Andrew of Anginal pain ([place]), Arthritis, Atrial fibrillation ([place]) (1962), Chest pain, Chronic kidney disease, Depression, Dysrhythmia, Esophageal reflux, Essential hypertension, Andrew of Paroxysmal atrial flutter ([place]), Hyperlipidemia, Incontinence of urine, Leg pain, bilateral, Nocturia, PONV (postoperative nausea and vomiting), Scarlet fever (1945), Spinal stenosis, Staph skin infection, and Ulcerative proctitis ([place]). here with:  1.  Restless leg syndrome -Continue Lyrica 100 mg, 3 tablets at bedtime -Continue Cymbalta 60 mg daily -Continue Requip 0.5 mg at bedtime -Continue clonazepam 0.5 mg 1/2-1 tablet as needed -Check CBC, ferritin level today (ferritin was low 15, in 2019) -Follow-up in 8 months or sooner if needed  2.  Lateral thigh area itching, paresthesia, rash -Andrew did not support meralgia paresthetica -Referred to dermatology, resolved  I spent 20 minutes of face-to-face and non-face-to-face time with patient.  This included previsit chart review, lab review, study review, order entry, electronic health  record documentation, patient education.  Butler Denmark, AGNP-C, DNP 04/17/2020, 2:23 PM [facility] [address] [place], [place] [address] [phone]

## 2020-04-18 LAB — CBC
Hematocrit: 48.6 % (ref 37.5–51.0)
Hemoglobin: 16.1 g/dL (ref 13.0–17.7)
MCH: 33.5 pg — ABNORMAL HIGH (ref 26.6–33.0)
MCHC: 33.1 g/dL (ref 31.5–35.7)
MCV: 101 fL — ABNORMAL HIGH (ref 79–97)
Platelets: 131 10*3/uL — ABNORMAL LOW (ref 150–450)
RBC: 4.81 x10E6/uL (ref 4.14–5.80)
RDW: 13.3 % (ref 11.6–15.4)
WBC: 6.9 10*3/uL (ref 3.4–10.8)

## 2020-04-18 LAB — FERRITIN: Ferritin: 55 ng/mL (ref 30–400)

## 2020-04-24 NOTE — Progress Notes (Signed)
I have reviewed and agreed above plan.  Ferritin 55 no Apr 17 2020

## 2020-06-04 ENCOUNTER — Other Ambulatory Visit: Payer: Self-pay | Admitting: Cardiology

## 2020-06-05 NOTE — Telephone Encounter (Signed)
Prescription refill request for Eliquis received. Indication: atrial fibrillation Last office visit: 09/2019  [place] Scr:1.5  4/21 Age: 80 Weight: 113 kg  Prescription refilled

## 2020-06-09 DIAGNOSIS — I10 Essential (primary) hypertension: Secondary | ICD-10-CM | POA: Diagnosis not present

## 2020-06-09 DIAGNOSIS — D508 Other iron deficiency anemias: Secondary | ICD-10-CM | POA: Diagnosis not present

## 2020-06-09 DIAGNOSIS — N1831 Chronic kidney disease, stage 3a: Secondary | ICD-10-CM | POA: Diagnosis not present

## 2020-06-09 DIAGNOSIS — M1711 Unilateral primary osteoarthritis, right knee: Secondary | ICD-10-CM | POA: Diagnosis not present

## 2020-06-09 DIAGNOSIS — K219 Gastro-esophageal reflux disease without esophagitis: Secondary | ICD-10-CM | POA: Diagnosis not present

## 2020-06-09 DIAGNOSIS — E782 Mixed hyperlipidemia: Secondary | ICD-10-CM | POA: Diagnosis not present

## 2020-06-14 NOTE — Progress Notes (Signed)
Received [facility] need for other ICD 10 for pts diagnosis for CBC/plt, ferritin, other then G25.81. placed parethesia R20.2, and gait abnormality R26. Faxed to [phone]. (fax confirmation received).

## 2020-08-07 DIAGNOSIS — D1801 Hemangioma of skin and subcutaneous tissue: Secondary | ICD-10-CM | POA: Diagnosis not present

## 2020-08-07 DIAGNOSIS — D225 Melanocytic nevi of trunk: Secondary | ICD-10-CM | POA: Diagnosis not present

## 2020-08-07 DIAGNOSIS — L304 Erythema intertrigo: Secondary | ICD-10-CM | POA: Diagnosis not present

## 2020-08-07 DIAGNOSIS — D229 Melanocytic nevi, unspecified: Secondary | ICD-10-CM | POA: Diagnosis not present

## 2020-08-07 DIAGNOSIS — L239 Allergic contact dermatitis, unspecified cause: Secondary | ICD-10-CM | POA: Diagnosis not present

## 2020-08-07 DIAGNOSIS — L821 Other seborrheic keratosis: Secondary | ICD-10-CM | POA: Diagnosis not present

## 2020-08-07 DIAGNOSIS — L84 Corns and callosities: Secondary | ICD-10-CM | POA: Diagnosis not present

## 2020-08-28 DIAGNOSIS — K219 Gastro-esophageal reflux disease without esophagitis: Secondary | ICD-10-CM | POA: Diagnosis not present

## 2020-08-28 DIAGNOSIS — M1711 Unilateral primary osteoarthritis, right knee: Secondary | ICD-10-CM | POA: Diagnosis not present

## 2020-08-28 DIAGNOSIS — E782 Mixed hyperlipidemia: Secondary | ICD-10-CM | POA: Diagnosis not present

## 2020-08-28 DIAGNOSIS — I10 Essential (primary) hypertension: Secondary | ICD-10-CM | POA: Diagnosis not present

## 2020-08-28 DIAGNOSIS — N1831 Chronic kidney disease, stage 3a: Secondary | ICD-10-CM | POA: Diagnosis not present

## 2020-08-28 DIAGNOSIS — D508 Other iron deficiency anemias: Secondary | ICD-10-CM | POA: Diagnosis not present

## 2020-09-04 ENCOUNTER — Other Ambulatory Visit: Payer: Self-pay | Admitting: Neurology

## 2020-09-05 DIAGNOSIS — L2084 Intrinsic (allergic) eczema: Secondary | ICD-10-CM | POA: Diagnosis not present

## 2020-09-05 DIAGNOSIS — L304 Erythema intertrigo: Secondary | ICD-10-CM | POA: Diagnosis not present

## 2020-09-05 DIAGNOSIS — L981 Factitial dermatitis: Secondary | ICD-10-CM | POA: Diagnosis not present

## 2020-09-05 DIAGNOSIS — L810 Postinflammatory hyperpigmentation: Secondary | ICD-10-CM | POA: Diagnosis not present

## 2020-09-06 DIAGNOSIS — R7309 Other abnormal glucose: Secondary | ICD-10-CM | POA: Diagnosis not present

## 2020-09-06 DIAGNOSIS — E782 Mixed hyperlipidemia: Secondary | ICD-10-CM | POA: Diagnosis not present

## 2020-09-06 DIAGNOSIS — D6869 Other thrombophilia: Secondary | ICD-10-CM | POA: Diagnosis not present

## 2020-09-06 DIAGNOSIS — G2581 Restless legs syndrome: Secondary | ICD-10-CM | POA: Diagnosis not present

## 2020-09-06 DIAGNOSIS — Z79899 Other long term (current) drug therapy: Secondary | ICD-10-CM | POA: Diagnosis not present

## 2020-09-06 DIAGNOSIS — K219 Gastro-esophageal reflux disease without esophagitis: Secondary | ICD-10-CM | POA: Diagnosis not present

## 2020-09-06 DIAGNOSIS — I10 Essential (primary) hypertension: Secondary | ICD-10-CM | POA: Diagnosis not present

## 2020-09-06 DIAGNOSIS — I4819 Other persistent atrial fibrillation: Secondary | ICD-10-CM | POA: Diagnosis not present

## 2020-09-06 DIAGNOSIS — N1831 Chronic kidney disease, stage 3a: Secondary | ICD-10-CM | POA: Diagnosis not present

## 2020-09-07 DIAGNOSIS — M1711 Unilateral primary osteoarthritis, right knee: Secondary | ICD-10-CM | POA: Diagnosis not present

## 2020-09-07 DIAGNOSIS — D508 Other iron deficiency anemias: Secondary | ICD-10-CM | POA: Diagnosis not present

## 2020-09-07 DIAGNOSIS — E782 Mixed hyperlipidemia: Secondary | ICD-10-CM | POA: Diagnosis not present

## 2020-09-07 DIAGNOSIS — K219 Gastro-esophageal reflux disease without esophagitis: Secondary | ICD-10-CM | POA: Diagnosis not present

## 2020-09-07 DIAGNOSIS — N1831 Chronic kidney disease, stage 3a: Secondary | ICD-10-CM | POA: Diagnosis not present

## 2020-09-07 DIAGNOSIS — I10 Essential (primary) hypertension: Secondary | ICD-10-CM | POA: Diagnosis not present

## 2020-10-07 NOTE — Progress Notes (Signed)
[name] Date of Birth: 08-07-1940 Medical Record #[id number]  History of Present Illness: Mr. Bushey is seen for evaluation of new onset Afib. He has a history of mixed hyperlipidemia, HTN, and obesity.  He has been intolerant to statins due to severe myalgias. This includes lipitor at a dose of 20 mg daily and Crestor 10 mg 3 days a week. Welchol apparently made no change in his lipid levels. Niacin was associated with severe flushing.   He has no history of vascular disease. He had normal cardiac caths in 1998 and 2009. No history of PAD or CVA/TIA.  He apparently had afib in 1962 but no documented recurrence afterwards.   He was seen recently by Dr Addison Lank for routine physical and found to be in Afib with controlled rate. Not symptomatic. Started on anticoagulation with Eliquis. Echo showed Normal LV and valvular function. Moderate LAE.  He is asymptomatic with his Afib. No dyspnea, palpitations, dizziness, or chest pain. No edema.   Since his last visit his wife has passed away after a long time dealing with advanced heart failure. He is still grieving. No palpitations. States he needs a TKR  Current Outpatient Medications on File Prior to Visit  Medication Sig Dispense Refill  . carvedilol (COREG) 3.125 MG tablet Take 3.125 mg by mouth at bedtime.     . clonazePAM (KLONOPIN) 0.5 MG tablet take 1/2-1 TABLET BY MOUTH AT BEDTIME AS NEEDED 30 tablet 5  . diphenhydrAMINE (BENADRYL) 50 MG capsule Take 50 mg by mouth at bedtime as needed.    . DULoxetine (CYMBALTA) 60 MG capsule Take 1 capsule (60 mg total) by mouth daily. 90 capsule 4  . ELIQUIS 5 MG TABS tablet TAKE ONE TABLET BY MOUTH EVERY MORNING and TAKE ONE TABLET BY MOUTH EVERY EVENING 180 tablet 1  . ferrous sulfate 324 (65 Fe) MG TBEC Take 1 tablet by mouth daily.    Marland Kitchen ibuprofen (ADVIL) 200 MG tablet Take 200 mg by mouth as needed (back pain).    Marland Kitchen lisinopril (PRINIVIL,ZESTRIL) 10 MG tablet Take 5 mg by mouth at bedtime.     .  methocarbamol (ROBAXIN) 500 MG tablet Take 500 mg by mouth at bedtime as needed for muscle spasms.     . Multiple Vitamins-Minerals (MULTIVITAMIN PO) Take 1 tablet by mouth daily.    . nitroGLYCERIN (NITROSTAT) 0.4 MG SL tablet Place 0.4 mg under the tongue every 5 (five) minutes as needed for chest pain.    . polyethylene glycol (MIRALAX / GLYCOLAX) packet Take 17 g by mouth daily as needed for mild constipation.    . pregabalin (LYRICA) 100 MG capsule TAKE THREE CAPSULES BY MOUTH EVERY EVENING 270 capsule 1  . rOPINIRole (REQUIP) 0.5 MG tablet Take 1 tablet (0.5 mg total) by mouth at bedtime. 30 tablet 11  . rosuvastatin (CRESTOR) 10 MG tablet TAKE 1 TABLET ONCE DAILY OR AS DIRECTED. 90 tablet 2  . tamsulosin (FLOMAX) 0.4 MG CAPS capsule Take 0.4 mg by mouth daily.     No current facility-administered medications on file prior to visit.    Allergies  Allergen Reactions  . Compazine [Prochlorperazine Edisylate] Other (See Comments)    EXTRAPYRAMIDAL MOVEMENT [Involuntary muscle movement]  . Penicillins Nausea And Vomiting, Rash and Other (See Comments)    Has patient had a PCN reaction causing immediate rash, facial/tongue/throat swelling, SOB or lightheadedness with hypotension: #  #  #  YES  #  #  #  Has patient had a  PCN reaction causing severe rash involving mucus membranes or skin necrosis: No Has patient had a PCN reaction that required hospitalization No Has patient had a PCN reaction occurring within the last 10 years: No If all of the above answers are "NO", then may proceed with Cephalosporin use.   [name]] Other (See Comments)    MYALGIAS [Leg aches]  . Gemfibrozil Nausea Only    PATIENT TOLERATES  . Red Yeast Rice [Cholestin] Other (See Comments)    flushing    Past Medical History:  Diagnosis Date  . Anginal pain (HCC)    NONE IN 3 YEARS  . Arthritis   . Atrial fibrillation ([place]) 1962   a. No recurrence since 1962.  . Chest pain    a. 1998  Cath: nl cors;  b. 2009 Cath: nl cors.  . Chronic kidney disease    stage 3  . Depression   . Dysrhythmia    NO TROUBLE IN 1 YR    DR. Maziah Keeling [place]   . Esophageal reflux   . Essential hypertension   . History of Paroxysmal atrial flutter ([place])   . Hyperlipidemia   . Incontinence of urine   . Leg pain, bilateral   . Nocturia   . PONV (postoperative nausea and vomiting)    CONSTIPATED  . Scarlet fever 1945  . Spinal stenosis   . Staph skin infection    LEFT GROIN  04/11/16  TX W/ DOXYCYCLINE  . Ulcerative proctitis [facility])     Past Surgical History:  Procedure Laterality Date  . ANKLE RECONSTRUCTION Right 2007   [place]  . ANTERIOR LAT LUMBAR FUSION Right 04/18/2016   Procedure: RIGHT LUMBAR THREE-FOUR, LUMBAR FOUR-FIVE ANTEROLATERAL LUMBAR INTERBODY FUSION;  Surgeon: Erline Levine, MD;  Location: [place];  Service: Neurosurgery;  Laterality: Right;  RIGHT L3-4 L4-5 ANTEROLATERAL LUMBAR INTERBODY FUSION  . CARDIAC CATHETERIZATION  03/03/2008   Left heart cardiac catheterization and coronary  . CARDIAC CATHETERIZATION  1997   Dr Wynonia Lawman  . CATARACT EXTRACTION W/ INTRAOCULAR LENS  IMPLANT, BILATERAL     2016  . ESOPHAGEAL DILATION     2008  . INNER EAR SURGERY     EAR INJECTION FOR MENIERES  . KNEE ARTHROSCOPY  1991, 1995  . LUMBAR LAMINECTOMY  12/2010  . LUMBAR LAMINECTOMY/DECOMPRESSION MICRODISCECTOMY Left 08/15/2016   Procedure: Left Left three- four Redo laminectomy;  Surgeon: Erline Levine, MD;  Location: [place];  Service: Neurosurgery;  Laterality: Left;  Left L3-4 Redo laminectomy  . LUMBAR PERCUTANEOUS PEDICLE SCREW 2 LEVEL N/A 04/18/2016   Procedure: LUMBAR THREE-FOUR, LUMBAR FOUR-FIVE PERCUTANEOUS PEDICLE SCREW;  Surgeon: Erline Levine, MD;  Location: [place];  Service: Neurosurgery;  Laterality: N/A;  . 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 TOTAL HIP ARTHROPLASTY Left 04/22/2017   Procedure: TOTAL HIP ARTHROPLASTY ANTERIOR APPROACH;  Surgeon: Melrose Nakayama, MD;  Location:  [place];  Service: Orthopedics;  Laterality: Left;    Social History   Tobacco Use  Smoking Status Current Some Day Smoker  . Types: Pipe  Smokeless Tobacco Never Used    Social History   Substance and Sexual Activity  Alcohol Use Yes  . Alcohol/week: 1.0 standard drink  . Types: 1 Shots of liquor per week   Comment: 1-3 weekly    Family History  Problem Relation Age of Onset  . Prostate cancer Father   . Heart attack Brother   . Kidney cancer Brother   . Brain cancer Brother  Review of Systems: As noted in HPI.  All other systems were reviewed and are negative.  Physical Exam: BP 100/80 (BP Location: Right Arm, Patient Position: Sitting, Cuff Size: Normal)   Pulse 74   Ht 5' 10.5" (1.791 m)   Wt 242 lb 6.4 oz (110 kg)   BMI 34.29 kg/m  GENERAL:  Well appearing WM in NAD. Walks with a cane HEENT:  PERRL, EOMI, sclera are clear. Oropharynx is clear. NECK:  No jugular venous distention, carotid upstroke brisk and symmetric, no bruits, no thyromegaly or adenopathy LUNGS:  Clear to auscultation bilaterally CHEST:  Unremarkable HEART:  IRRR,  PMI not displaced or sustained,S1 and S2 within normal limits, no S3, no S4: no clicks, no rubs, no murmurs ABD:  Soft, nontender. BS +, no masses or bruits. No hepatomegaly, no splenomegaly EXT:  2 + pulses throughout, no edema, no cyanosis no clubbing SKIN:  Warm and dry.  No rashes NEURO:  Alert and oriented x 3. Cranial nerves II through XII intact. PSYCH:  Cognitively intact  LABORATORY DATA: Lab Results  Component Value Date   WBC 6.9 04/17/2020   HGB 16.1 04/17/2020   HCT 48.6 04/17/2020   PLT 131 (L) 04/17/2020   GLUCOSE 96 05/29/2019   CHOL 90 (L) 01/13/2017   TRIG 138 01/13/2017   HDL 29 (L) 01/13/2017   LDLCALC 33 01/13/2017   ALT 20 01/13/2017   AST 26 01/13/2017   NA 135 05/29/2019   K 4.0 05/29/2019   CL 100 05/29/2019   CREATININE 1.20 05/29/2019   BUN 28 (H) 05/29/2019   CO2 25 04/14/2017   TSH  1.640 08/07/2017   INR 0.96 04/14/2017   Labs dated 12/26/16: A1c 5.8%, Hgb 14.2, creatinine 1.21. TSH normal.  Dated 07/07/18: A1c 5.6%. creatinine 1.34. potassium 5.5. TSH normal. CBC normal. Dated 01/04/19: A1c 5.7% Dated 06/03/19: creatinine 1.32. CMET and CBC normal.  Dated 09/06/20: cholesterol 169, triglycerides 169, HDL 29, LDL 110, A1c 5.5%. creatinine 1.4. otherwise CMET normal. CBC normal.    Ecg today shows Afib  rate 74 with LAD. I have personally reviewed and interpreted this study.  Echo 07/09/18: IMPRESSIONS    1. The left ventricle has normal systolic function of 84-16%. The cavity size was normal. There is no increased left ventricular wall thickness. Left ventricular diastology could not be evaluated secondary to atrial fibrillation.  2. The right ventricle has normal systolic function. The cavity was mildly enlarged. There is no increase in right ventricular wall thickness.  3. Left atrial size was moderately dilated.  4. The mitral valve is normal in structure.  5. The tricuspid valve is normal in structure.  6. The aortic valve is normal in structure. Aortic valve regurgitation is mild by color flow Doppler.  7. The pulmonic valve was normal in structure.  8. There is mild dilatation of the ascending aorta.  FINDINGS  Left Ventricle: The left ventricle has normal systolic function of 60-63%. The cavity size was normal. There is no increased left ventricular wall thickness. Left ventricular diastology could not be evaluated secondary to atrial fibrillation. Right Ventricle: The right ventricle has normal systolic function. The cavity was mildly enlarged. There is no increase in right ventricular wall thickness. Left Atrium: left atrial size was moderately dilated Right Atrium: right atrial size was normal in size Interatrial Septum: No atrial level shunt detected by color flow Doppler.  Assessment / Plan: 1. Atrial fibrillation- persistent and now permanent.  He is  completely asymptomatic. Rate  controlled on low dose Coreg. Now on Eliquis for anticoagulation. [place] Vasc of 2.   2. Mixed hyperlipidemia. Now on Crestor and Zetia and tolerating well. Labs look OK. He asked about stopping Zetia and I think this is reasonable since he doesn't have CAD.  3. HTN- controlled.   4. Normal cardiac cath 2009.  5. Osteoarthritis of the knee. He is cleared from our standpoint if surgery planned. OK to hold Eliquis 2 days prior.   I will follow up in one year

## 2020-10-13 ENCOUNTER — Ambulatory Visit: Payer: Medicare Other | Admitting: Cardiology

## 2020-10-13 ENCOUNTER — Other Ambulatory Visit: Payer: Self-pay

## 2020-10-13 ENCOUNTER — Encounter: Payer: Self-pay | Admitting: Cardiology

## 2020-10-13 VITALS — BP 100/80 | HR 74 | Ht 70.5 in | Wt 242.4 lb

## 2020-10-13 DIAGNOSIS — E782 Mixed hyperlipidemia: Secondary | ICD-10-CM | POA: Diagnosis not present

## 2020-10-13 DIAGNOSIS — I10 Essential (primary) hypertension: Secondary | ICD-10-CM | POA: Diagnosis not present

## 2020-10-13 DIAGNOSIS — I4821 Permanent atrial fibrillation: Secondary | ICD-10-CM

## 2020-10-13 NOTE — Patient Instructions (Signed)
Stop taking Zetia.

## 2020-10-16 DIAGNOSIS — K219 Gastro-esophageal reflux disease without esophagitis: Secondary | ICD-10-CM | POA: Diagnosis not present

## 2020-10-16 DIAGNOSIS — E782 Mixed hyperlipidemia: Secondary | ICD-10-CM | POA: Diagnosis not present

## 2020-10-16 DIAGNOSIS — D508 Other iron deficiency anemias: Secondary | ICD-10-CM | POA: Diagnosis not present

## 2020-10-16 DIAGNOSIS — I10 Essential (primary) hypertension: Secondary | ICD-10-CM | POA: Diagnosis not present

## 2020-10-16 DIAGNOSIS — N1831 Chronic kidney disease, stage 3a: Secondary | ICD-10-CM | POA: Diagnosis not present

## 2020-10-16 DIAGNOSIS — M1711 Unilateral primary osteoarthritis, right knee: Secondary | ICD-10-CM | POA: Diagnosis not present

## 2020-11-06 ENCOUNTER — Telehealth: Payer: Self-pay | Admitting: Cardiology

## 2020-11-06 ENCOUNTER — Other Ambulatory Visit: Payer: Self-pay | Admitting: Orthopaedic Surgery

## 2020-11-06 DIAGNOSIS — N401 Enlarged prostate with lower urinary tract symptoms: Secondary | ICD-10-CM | POA: Insufficient documentation

## 2020-11-06 DIAGNOSIS — Z01818 Encounter for other preprocedural examination: Secondary | ICD-10-CM

## 2020-11-06 DIAGNOSIS — M1711 Unilateral primary osteoarthritis, right knee: Secondary | ICD-10-CM | POA: Diagnosis not present

## 2020-11-06 NOTE — Telephone Encounter (Signed)
Patient with diagnosis of A Fib on Eliquis for anticoagulation.    1. Procedure: Right knee replacement  Date of procedure: 11/21/20   CHA2DS2-VASc Score = 3  This indicates a 3.2% annual risk of stroke. [name] score is based upon: CHF History: No HTN History: Yes Diabetes History: No Stroke History: No Vascular Disease History: No Age Score: 2 Gender Score: 0    CrCl 51 mL/min using adjusted body weight Platelet count 131K  Per office protocol, patient can hold Eliquis for 3 days prior to procedure.   Patient will not need bridging with Lovenox (enoxaparin) around procedure.

## 2020-11-06 NOTE — Telephone Encounter (Signed)
   [facility] Pre-operative Risk Assessment    Request for surgical clearance:  1. What type of surgery is being performed? Right knee replacement  2. When is this surgery scheduled? June 21  3. What type of clearance is required (medical clearance vs. Pharmacy clearance to hold med vs. Both)? Both  4.  5. Are there any medications that need to be held prior to surgery and how long? ELIQUIS 5 MG TABS tablet 3-5 days  6. Practice name and name of physician performing surgery? Dr. Melrose Nakayama  7. What is your office phone number [phone]   7.   What is your office fax number [phone]  8.   Anesthesia type (None, local, MAC, general) ? spinal   [name] 11/06/2020, 12:23 PM  _________________________________________________________________   (provider comments below)

## 2020-11-08 ENCOUNTER — Telehealth: Payer: Self-pay | Admitting: *Deleted

## 2020-11-08 NOTE — Telephone Encounter (Signed)
Received fax for patient  from [facility] for right knee arthroplasty under spinal anesthesia.  Patient has an upcoming appointment December 19, 2018.  Surgery is scheduled June 21.  Preop is June 14.

## 2020-11-09 NOTE — Telephone Encounter (Signed)
Signed clearance form from Judson Roch to [facility] [phone] with last office note faxed with fax confirmation received.

## 2020-11-13 NOTE — Care Plan (Signed)
Ortho Bundle Case Management Note  Patient Details  Name: [name] MRN: [id number] Date of Birth: 1941-05-19   Spoke with patient in the office prior to surgery. He will discharge to home with family to assist. Has equipment at home. HHPT referral to [facility] and [facility] set up with [facility]. Patient and MD in agreement with plan. Choice offered.                   DME Arranged:    DME Agency:     HH Arranged:  PT HH Agency:  [facility]  Additional Comments: Please contact me with any questions of if this plan should need to change.  Ladell Heads,  [place] Specialist  [phone] 11/13/2020, 4:51 PM

## 2020-11-14 ENCOUNTER — Encounter (HOSPITAL_COMMUNITY)
Admission: RE | Admit: 2020-11-14 | Discharge: 2020-11-14 | Disposition: A | Discharge status: Home / Self Care | Payer: Medicare Other | Source: Ambulatory Visit | Attending: Orthopaedic Surgery | Admitting: Orthopaedic Surgery

## 2020-11-14 ENCOUNTER — Other Ambulatory Visit: Payer: Self-pay

## 2020-11-14 ENCOUNTER — Ambulatory Visit (HOSPITAL_COMMUNITY)
Admission: RE | Admit: 2020-11-14 | Discharge: 2020-11-14 | Disposition: A | Discharge status: Home / Self Care | Payer: Medicare Other | Source: Ambulatory Visit | Attending: Orthopaedic Surgery | Admitting: Orthopaedic Surgery

## 2020-11-14 ENCOUNTER — Encounter (HOSPITAL_COMMUNITY): Payer: Self-pay

## 2020-11-14 DIAGNOSIS — Z01818 Encounter for other preprocedural examination: Secondary | ICD-10-CM | POA: Insufficient documentation

## 2020-11-14 DIAGNOSIS — Z7901 Long term (current) use of anticoagulants: Secondary | ICD-10-CM | POA: Insufficient documentation

## 2020-11-14 DIAGNOSIS — I4891 Unspecified atrial fibrillation: Secondary | ICD-10-CM | POA: Diagnosis not present

## 2020-11-14 DIAGNOSIS — K449 Diaphragmatic hernia without obstruction or gangrene: Secondary | ICD-10-CM | POA: Diagnosis not present

## 2020-11-14 LAB — COMPREHENSIVE METABOLIC PANEL
ALT: 26 U/L (ref 0–44)
AST: 40 U/L (ref 15–41)
Albumin: 4.5 g/dL (ref 3.5–5.0)
Alkaline Phosphatase: 53 U/L (ref 38–126)
Anion gap: 5 (ref 5–15)
BUN: 27 mg/dL — ABNORMAL HIGH (ref 8–23)
CO2: 27 mmol/L (ref 22–32)
Calcium: 8.8 mg/dL — ABNORMAL LOW (ref 8.9–10.3)
Chloride: 107 mmol/L (ref 98–111)
Creatinine, Ser: 1.43 mg/dL — ABNORMAL HIGH (ref 0.61–1.24)
GFR, Estimated: 49 mL/min — ABNORMAL LOW (ref >60)
Glucose, Bld: 88 mg/dL (ref 70–99)
Potassium: 4.9 mmol/L (ref 3.5–5.1)
Sodium: 139 mmol/L (ref 135–145)
Total Bilirubin: 1.1 mg/dL (ref 0.3–1.2)
Total Protein: 7.1 g/dL (ref 6.5–8.1)

## 2020-11-14 LAB — CBC WITH DIFFERENTIAL/PLATELET
Abs Immature Granulocytes: 0.02 10*3/uL (ref 0.00–0.07)
Basophils Absolute: 0.1 10*3/uL (ref 0.0–0.1)
Basophils Relative: 1 %
Eosinophils Absolute: 0.3 10*3/uL (ref 0.0–0.5)
Eosinophils Relative: 3 %
HCT: 49.2 % (ref 39.0–52.0)
Hemoglobin: 16.2 g/dL (ref 13.0–17.0)
Immature Granulocytes: 0 %
Lymphocytes Relative: 25 %
Lymphs Abs: 1.9 10*3/uL (ref 0.7–4.0)
MCH: 33.6 pg (ref 26.0–34.0)
MCHC: 32.9 g/dL (ref 30.0–36.0)
MCV: 102.1 fL — ABNORMAL HIGH (ref 80.0–100.0)
Monocytes Absolute: 0.7 10*3/uL (ref 0.1–1.0)
Monocytes Relative: 10 %
Neutro Abs: 4.6 10*3/uL (ref 1.7–7.7)
Neutrophils Relative %: 61 %
Platelets: 152 10*3/uL (ref 150–400)
RBC: 4.82 MIL/uL (ref 4.22–5.81)
RDW: 13.9 % (ref 11.5–15.5)
WBC: 7.6 10*3/uL (ref 4.0–10.5)
nRBC: 0 % (ref 0.0–0.2)

## 2020-11-14 LAB — URINALYSIS, ROUTINE W REFLEX MICROSCOPIC
Bilirubin Urine: NEGATIVE
Glucose, UA: NEGATIVE mg/dL
Hgb urine dipstick: NEGATIVE
Ketones, ur: NEGATIVE mg/dL
Leukocytes,Ua: NEGATIVE
Nitrite: NEGATIVE
Protein, ur: NEGATIVE mg/dL
Specific Gravity, Urine: 1.018 (ref 1.005–1.030)
pH: 5 (ref 5.0–8.0)

## 2020-11-14 LAB — SURGICAL PCR SCREEN
MRSA, PCR: NEGATIVE
Staphylococcus aureus: NEGATIVE

## 2020-11-14 LAB — PROTIME-INR
INR: 1.1 (ref 0.8–1.2)
Prothrombin Time: 13.8 seconds (ref 11.4–15.2)

## 2020-11-14 LAB — APTT: aPTT: 36 seconds (ref 24–36)

## 2020-11-14 NOTE — Progress Notes (Signed)
DUE TO COVID-19 ONLY ONE VISITOR IS ALLOWED TO COME WITH YOU AND STAY IN THE WAITING ROOM ONLY DURING PRE OP AND PROCEDURE DAY OF SURGERY. THE 1 VISITOR  MAY VISIT WITH YOU AFTER SURGERY IN YOUR PRIVATE ROOM DURING VISITING HOURS ONLY!  YOU NEED TO HAVE A COVID 19 TEST ON_6/16/2022 ______ @_______ , THIS TEST MUST BE DONE BEFORE SURGERY,  COVID TESTING SITE [address] [place] [address], IT IS ON THE RIGHT GOING OUT [address] APPROXIMATELY  2 MINUTES PAST [employer] ON THE RIGHT. ONCE YOUR COVID TEST IS COMPLETED,  PLEASE BEGIN THE QUARANTINE INSTRUCTIONS AS OUTLINED IN YOUR HANDOUT.                [name]  11/14/2020   Your procedure is scheduled on:                11/21/20  Report to [facility] Main  Entrance   Report to admitting at     [phone]     Call this number if you have problems the morning of surgery [phone]    REMEMBER: NO  SOLID FOOD CANDY OR GUM AFTER MIDNIGHT. CLEAR LIQUIDS UNTIL     0645am       . NOTHING BY MOUTH EXCEPT CLEAR LIQUIDS UNTIL    0645am   . PLEASE FINISH ENSURE DRINK PER SURGEON ORDER  WHICH NEEDS TO BE COMPLETED AT  0645am     .      CLEAR LIQUID DIET   Foods Allowed                                                                    Coffee and tea, regular and decaf                            Fruit ices (not with fruit pulp)                                      Iced Popsicles                                    Carbonated beverages, regular and diet                                    Cranberry, grape and apple juices Sports drinks like Gatorade Lightly seasoned clear broth or consume(fat free) Sugar, honey syrup ___________________________________________________________________      BRUSH YOUR TEETH MORNING OF SURGERY AND RINSE YOUR MOUTH OUT, NO CHEWING GUM CANDY OR MINTS.     Take these medicines the morning of surgery with A SIP OF WATER:     Flomax, pirlosec, cymbalta  DO NOT TAKE ANY DIABETIC  MEDICATIONS DAY OF YOUR SURGERY                               You may not have any metal on  your body including hair pins and              piercings  Do not wear jewelry, make-up, lotions, powders or perfumes, deodorant             Do not wear nail polish on your fingernails.  Do not shave  48 hours prior to surgery.              Men may shave face and neck.   Do not bring valuables to the hospital. [place].  Contacts, dentures or bridgework may not be worn into surgery.  Leave suitcase in the car. After surgery it may be brought to your room.     Patients discharged the day of surgery will not be allowed to drive home. IF YOU ARE HAVING SURGERY AND GOING HOME THE SAME DAY, YOU MUST HAVE AN ADULT TO DRIVE YOU HOME AND BE WITH YOU FOR 24 HOURS. YOU MAY GO HOME BY TAXI OR UBER OR ORTHERWISE, BUT AN ADULT MUST ACCOMPANY YOU HOME AND STAY WITH YOU FOR 24 HOURS.  Name and phone number of your driver:  Special Instructions: N/A              Please read over the following fact sheets you were given: _____________________________________________________________________  [facility] - Preparing for Surgery Before surgery, you can play an important role.  Because skin is not sterile, your skin needs to be as free of germs as possible.  You can reduce the number of germs on your skin by washing with CHG (chlorahexidine gluconate) soap before surgery.  CHG is an antiseptic cleaner which kills germs and bonds with the skin to continue killing germs even after washing. Please DO NOT use if you have an allergy to CHG or antibacterial soaps.  If your skin becomes reddened/irritated stop using the CHG and inform your nurse when you arrive at Short Stay. Do not shave (including legs and underarms) for at least 48 hours prior to the first CHG shower.  You may shave your face/neck. Please follow these instructions carefully:  1.  Shower with CHG Soap the night  before surgery and the  morning of Surgery.  2.  If you choose to wash your hair, wash your hair first as usual with your  normal  shampoo.  3.  After you shampoo, rinse your hair and body thoroughly to remove the  shampoo.                           4.  Use CHG as you would any other liquid soap.  You can apply chg directly  to the skin and wash                       Gently with a scrungie or clean washcloth.  5.  Apply the CHG Soap to your body ONLY FROM THE NECK DOWN.   Do not use on face/ open                           Wound or open sores. Avoid contact with eyes, ears mouth and genitals (private parts).                       Wash face,  Genitals (  private parts) with your normal soap.             6.  Wash thoroughly, paying special attention to the area where your surgery  will be performed.  7.  Thoroughly rinse your body with warm water from the neck down.  8.  DO NOT shower/wash with your normal soap after using and rinsing off  the CHG Soap.                9.  Pat yourself dry with a clean towel.            10.  Wear clean pajamas.            11.  Place clean sheets on your bed the night of your first shower and do not  sleep with pets. Day of Surgery : Do not apply any lotions/deodorants the morning of surgery.  Please wear clean clothes to the hospital/surgery center.  FAILURE TO FOLLOW THESE INSTRUCTIONS MAY RESULT IN THE CANCELLATION OF YOUR SURGERY PATIENT SIGNATURE_________________________________  NURSE SIGNATURE__________________________________  ________________________________________________________________________

## 2020-11-14 NOTE — Progress Notes (Addendum)
Anesthesia Review:  PCP: DR Cari Caraway   Called and requested clearances be faxed to [place] for [facility] at [place].   Cardiologist : DR Peter Martinique 10/13/20 LOV  Chest x-ray : EKG :10/13/20  Echo : 07/09/20  Stress test: Cardiac Cath :  Activity level: cannot do a flight of stairs without difficulty  Sleep Study/ CPAP : none  Fasting Blood Sugar :      / Checks Blood Sugar -- times a day:   Blood Thinner/ Instructions /Last Dose: ASA / Instructions/ Last Dose :   Eliquis- Stop 5 days prior to surgery per pt

## 2020-11-15 DIAGNOSIS — I10 Essential (primary) hypertension: Secondary | ICD-10-CM | POA: Diagnosis not present

## 2020-11-15 DIAGNOSIS — R7301 Impaired fasting glucose: Secondary | ICD-10-CM | POA: Diagnosis not present

## 2020-11-15 DIAGNOSIS — I4819 Other persistent atrial fibrillation: Secondary | ICD-10-CM | POA: Diagnosis not present

## 2020-11-15 DIAGNOSIS — M179 Osteoarthritis of knee, unspecified: Secondary | ICD-10-CM | POA: Diagnosis not present

## 2020-11-15 DIAGNOSIS — Z01818 Encounter for other preprocedural examination: Secondary | ICD-10-CM | POA: Diagnosis not present

## 2020-11-16 NOTE — Anesthesia Preprocedure Evaluation (Addendum)
Anesthesia Evaluation  Patient identified by MRN, date of birth, ID band Patient awake    History of Anesthesia Complications (+) PONV  Airway Mallampati: II  TM Distance: >3 FB Neck ROM: Full    Dental  (+) Teeth Intact   Pulmonary neg pulmonary ROS, Current Smoker,    Pulmonary exam normal        Cardiovascular hypertension, Pt. on medications and Pt. on home beta blockers + dysrhythmias Atrial Fibrillation  Rhythm:Irregular Rate:Normal     Neuro/Psych Depression negative neurological ROS     GI/Hepatic Neg liver ROS, PUD, GERD  Medicated,  Endo/Other  negative endocrine ROS  Renal/GU negative Renal ROS  negative genitourinary   Musculoskeletal  (+) Arthritis , Osteoarthritis,    Abdominal (+)  Abdomen: soft. Bowel sounds: normal.  Peds  Hematology negative hematology ROS (+)   Anesthesia Other Findings   Reproductive/Obstetrics                            Anesthesia Physical Anesthesia Plan  ASA: 3  Anesthesia Plan: Spinal and MAC   Post-op Pain Management:    Induction: Intravenous  PONV Risk Score and Plan: 1 and Ondansetron, Dexamethasone, Propofol infusion and Treatment may vary due to age or medical condition  Airway Management Planned: Simple Face Mask, Natural Airway and Nasal Cannula  Additional Equipment: None  Intra-op Plan:   Post-operative Plan:   Informed Consent: I have reviewed the patients History and Physical, chart, labs and discussed the procedure including the risks, benefits and alternatives for the proposed anesthesia with the patient or authorized representative who has indicated his/her understanding and acceptance.     Dental advisory given  Plan Discussed with: CRNA  Anesthesia Plan Comments: (See PAT note 11/14/2020, Konrad Felix, PA-C Lab Results      Component                Value               Date                      WBC                       7.6                 11/14/2020                HGB                      16.2                11/14/2020                HCT                      49.2                11/14/2020                MCV                      102.1 (H)           11/14/2020                PLT  152                 11/14/2020           Lab Results      Component                Value               Date                      NA                       139                 11/14/2020                K                        4.9                 11/14/2020                CO2                      27                  11/14/2020                GLUCOSE                  88                  11/14/2020                BUN                      27 (H)              11/14/2020                CREATININE               1.43 (H)            11/14/2020                CALCIUM                  8.8 (L)             11/14/2020                GFRNONAA                 49 (L)              11/14/2020                GFRAA                    59 (L)              04/14/2017          )     Anesthesia Quick Evaluation

## 2020-11-16 NOTE — Progress Notes (Signed)
Anesthesia Chart Review   Case: [id number] Date/Time: 11/21/20 0947   Procedure: RIGHT TOTAL KNEE ARTHROPLASTY (Right: Knee)   Anesthesia type: Spinal   Pre-op diagnosis: RIGHT KNEE DEGENERATIVE JOINT DISEASE   Location: [facility] ROOM 06 / [facility]   Surgeons: Melrose Nakayama, MD       DISCUSSION:80 y.o. smoker with h/o PONV, HTN, atrial fibrillation, right knee djd scheduled for above procedure 11/21/2020 with Dr. Melrose Nakayama.   Pt seen by cardiology for evaluation of new onset a-fib 10/13/2020. Per OV note a-fib asymptomatic, started on Eliquis. Echo with normal LV and valvular function. "He is cleared from our standpoint if surgery planned. OK to hold Eliquis 2 days prior."  Anticipate pt can proceed with planned procedure barring acute status change.   VS: BP 138/73   Pulse 78   Temp 36.8 C (Oral)   Resp 16   Ht 5\' 10"  (1.778 m)   Wt 121.6 kg   SpO2 99%   BMI 38.45 kg/m   PROVIDERS: Cari Caraway, MD is PCP   Martinique, Peter, MD is Cardiologist  LABS: Labs reviewed: Acceptable for surgery. (all labs ordered are listed, but only abnormal results are displayed)  Labs Reviewed  CBC WITH DIFFERENTIAL/PLATELET - Abnormal; Notable for the following components:      Result Value   MCV 102.1 (*)    All other components within normal limits  COMPREHENSIVE METABOLIC PANEL - Abnormal; Notable for the following components:   BUN 27 (*)    Creatinine, Ser 1.43 (*)    Calcium 8.8 (*)    GFR, Estimated 49 (*)    All other components within normal limits  SURGICAL PCR SCREEN  PROTIME-INR  APTT  URINALYSIS, ROUTINE W REFLEX MICROSCOPIC  TYPE AND SCREEN     IMAGES:   EKG: 10/13/2020 Rate 74 bpm  Atrial fibrillation with premature ventricular or aberrantly conducted complexes Left axis deviation Inferior infarct, age undetermined  CV: Echo 07/09/2018  1. The left ventricle has normal systolic function of 56-43%. The cavity  size was normal. There is no increased left  ventricular wall thickness.  Left ventricular diastology could not be evaluated secondary to atrial  fibrillation.   2. The right ventricle has normal systolic function. The cavity was  mildly enlarged. There is no increase in right ventricular wall thickness.   3. Left atrial size was moderately dilated.   4. The mitral valve is normal in structure.   5. The tricuspid valve is normal in structure.   6. The aortic valve is normal in structure. Aortic valve regurgitation is  mild by color flow Doppler.   7. The pulmonic valve was normal in structure.   8. There is mild dilatation of the ascending aorta.  Past Medical History:  Diagnosis Date   Anginal pain ([place])    NONE IN 3 YEARS   Arthritis    Atrial fibrillation ([place]) 1962   a. No recurrence since 1962.   Chest pain    a. 1998 Cath: nl cors;  b. 2009 Cath: nl cors.   Depression    Dysrhythmia    NO TROUBLE IN 1 YR    DR. PETER Martinique    Esophageal reflux    Essential hypertension    History of Paroxysmal atrial flutter (H[facility])    Hyperlipidemia    Incontinence of urine    Leg pain, bilateral    Nocturia    PONV (postoperative nausea and vomiting)    CONSTIPATED   Scarlet fever 1945  Spinal stenosis    Staph skin infection    LEFT GROIN  04/11/16  [place] W/ DOXYCYCLINE   Ulcerative proctitis [facility])     Past Surgical History:  Procedure Laterality Date   ANKLE RECONSTRUCTION Right 2007   [place]   ANTERIOR LAT LUMBAR FUSION Right 04/18/2016   Procedure: RIGHT LUMBAR THREE-FOUR, LUMBAR FOUR-FIVE ANTEROLATERAL LUMBAR INTERBODY FUSION;  Surgeon: Erline Levine, MD;  Location: [place];  Service: Neurosurgery;  Laterality: Right;  RIGHT L3-4 L4-5 ANTEROLATERAL LUMBAR INTERBODY FUSION   CARDIAC CATHETERIZATION  03/03/2008   Left heart cardiac catheterization and coronary   CARDIAC CATHETERIZATION  1997   Dr Wynonia Lawman   CATARACT EXTRACTION W/ INTRAOCULAR LENS  IMPLANT, BILATERAL     2016   ESOPHAGEAL DILATION     2008   INNER EAR  SURGERY     EAR INJECTION FOR MENIERES   KNEE ARTHROSCOPY  1991, 1995   LUMBAR LAMINECTOMY  12/2010   LUMBAR LAMINECTOMY/DECOMPRESSION MICRODISCECTOMY Left 08/15/2016   Procedure: Left Left three- four Redo laminectomy;  Surgeon: Erline Levine, MD;  Location: [place];  Service: Neurosurgery;  Laterality: Left;  Left L3-4 Redo laminectomy   LUMBAR PERCUTANEOUS PEDICLE SCREW 2 LEVEL N/A 04/18/2016   Procedure: LUMBAR THREE-FOUR, LUMBAR FOUR-FIVE PERCUTANEOUS PEDICLE SCREW;  Surgeon: Erline Levine, MD;  Location: [place];  Service: Neurosurgery;  Laterality: N/A;   TOTAL HIP ARTHROPLASTY Left 04/22/2017   TOTAL HIP ARTHROPLASTY Left 04/22/2017   Procedure: TOTAL HIP ARTHROPLASTY ANTERIOR APPROACH;  Surgeon: Melrose Nakayama, MD;  Location: [place];  Service: Orthopedics;  Laterality: Left;    MEDICATIONS:  ARTIFICIAL TEAR OP   carvedilol (COREG) 3.125 MG tablet   clonazePAM (KLONOPIN) 0.5 MG tablet   DULoxetine (CYMBALTA) 60 MG capsule   ELIQUIS 5 MG TABS tablet   ferrous sulfate 324 (65 Fe) MG TBEC   ibuprofen (ADVIL) 200 MG tablet   lisinopril (ZESTRIL) 5 MG tablet   methocarbamol (ROBAXIN) 750 MG tablet   Multiple Vitamins-Minerals (MULTIVITAMIN PO)   nitroGLYCERIN (NITROSTAT) 0.4 MG SL tablet   omeprazole (PRILOSEC) 20 MG capsule   polyethylene glycol (MIRALAX / GLYCOLAX) packet   pregabalin (LYRICA) 100 MG capsule   rOPINIRole (REQUIP) 0.5 MG tablet   rosuvastatin (CRESTOR) 10 MG tablet   tamsulosin (FLOMAX) 0.4 MG CAPS capsule   No current facility-administered medications for this encounter.     Konrad Felix, PA-C [facility] [phone]

## 2020-11-17 ENCOUNTER — Other Ambulatory Visit (HOSPITAL_COMMUNITY)
Admission: RE | Admit: 2020-11-17 | Discharge: 2020-11-17 | Disposition: A | Discharge status: Home / Self Care | Payer: Medicare Other | Source: Ambulatory Visit | Attending: Orthopaedic Surgery | Admitting: Orthopaedic Surgery

## 2020-11-17 DIAGNOSIS — Z01812 Encounter for preprocedural laboratory examination: Secondary | ICD-10-CM | POA: Diagnosis not present

## 2020-11-17 DIAGNOSIS — Z20822 Contact with and (suspected) exposure to covid-19: Secondary | ICD-10-CM | POA: Diagnosis not present

## 2020-11-17 LAB — SARS CORONAVIRUS 2 (TAT 6-24 HRS): SARS Coronavirus 2: NEGATIVE

## 2020-11-20 MED ORDER — TRANEXAMIC ACID 1000 MG/10ML IV SOLN
2000.0000 mg | INTRAVENOUS | Status: DC
  Filled 2020-11-20: qty 20

## 2020-11-20 MED ORDER — BUPIVACAINE LIPOSOME 1.3 % IJ SUSP
20.0000 mL | Freq: Once | INTRAMUSCULAR | Status: DC
  Filled 2020-11-20: qty 20

## 2020-11-20 MED ORDER — VANCOMYCIN HCL 1500 MG/300ML IV SOLN
1500.0000 mg | INTRAVENOUS | Status: AC
  Administered 2020-11-21: 1500 mg via INTRAVENOUS
  Filled 2020-11-20: qty 300

## 2020-11-20 NOTE — H&P (Signed)
TOTAL KNEE ADMISSION H&P  Patient is being admitted for right total knee arthroplasty.  Subjective:  Chief Complaint:right knee pain.  HPI: [name], 80 y.o. male, has a history of pain and functional disability in the right knee due to arthritis and has failed non-surgical conservative treatments for greater than 12 weeks to includeNSAID's and/or analgesics, corticosteriod injections, viscosupplementation injections, flexibility and strengthening excercises, supervised PT with diminished ADL's post treatment, use of assistive devices, weight reduction as appropriate, and activity modification.  Onset of symptoms was gradual, starting 5 years ago with gradually worsening course since that time. The patient noted prior procedures on the knee to include  arthroscopy on the right knee(s).  Patient currently rates pain in the right knee(s) at 10 out of 10 with activity. Patient has night pain, worsening of pain with activity and weight bearing, pain that interferes with activities of daily living, crepitus, and joint swelling.  Patient has evidence of subchondral cysts, subchondral sclerosis, periarticular osteophytes, and joint space narrowing by imaging studies. There is no active infection.  Patient Active Problem List   Diagnosis Date Noted   Benign prostatic hyperplasia with lower urinary tract symptoms 11/06/2020   Low back pain 08/10/2019   Gait abnormality 08/10/2019   Paresthesia 08/10/2018   Persistent atrial fibrillation ([place]) 07/07/2018   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/22/2017   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   Spinal stenosis, lumbar region, with neurogenic claudication 04/18/2016   Essential hypertension    Restless leg syndrome, uncontrolled 08/31/2014   Restless leg syndrome 04/19/2014   Leg pain, bilateral    Normal coronary arteries 02/01/2014   CKD (chronic kidney disease), stage III ([place]) 07/05/2013   Dysphagia 07/05/2013   Esophageal  reflux 07/05/2013   Hearing difficulty 07/05/2013   Hypertension 07/05/2013   Impaired fasting glucose 07/05/2013   Insomnia 07/05/2013   Spinal stenosis 07/05/2013   Major depressive disorder, recurrent episode ([place]) 07/05/2013   Osteoarthrosis, unspecified whether generalized or localized, unspecified site 07/05/2013   Tachyarrhythmia 07/05/2013   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 No recurrence since 1962.   Chest pain    a. 1998 Cath: nl cors;  b. 2009 Cath: nl cors.   Depression    Dysrhythmia    NO TROUBLE IN 1 YR    DR. PETER Martinique    Esophageal reflux    Essential hypertension    History of Paroxysmal atrial flutter (H[facility])    Hyperlipidemia    Incontinence of urine    Leg pain, bilateral    Nocturia    PONV (postoperative nausea and vomiting)    CONSTIPATED   Scarlet fever 1945   Spinal stenosis    Staph skin infection    LEFT GROIN  04/11/16  [place] W/ DOXYCYCLINE   Ulcerative proctitis ([place])     Past Surgical History:  Procedure Laterality Date   ANKLE RECONSTRUCTION Right 2007   [place]   ANTERIOR LAT LUMBAR FUSION Right 04/18/2016   Procedure: RIGHT LUMBAR THREE-FOUR, LUMBAR FOUR-FIVE ANTEROLATERAL LUMBAR INTERBODY FUSION;  Surgeon: Erline Levine, MD;  Location: [place];  Service: Neurosurgery;  Laterality: Right;  RIGHT L3-4 L4-5 ANTEROLATERAL LUMBAR INTERBODY FUSION   CARDIAC CATHETERIZATION  03/03/2008   Left heart cardiac catheterization and coronary   CARDIAC CATHETERIZATION  1997   Dr Wynonia Lawman   CATARACT EXTRACTION W/ INTRAOCULAR LENS  IMPLANT, BILATERAL     2016   ESOPHAGEAL DILATION     2008   INNER EAR SURGERY     EAR INJECTION FOR MENIERES   KNEE ARTHROSCOPY  1991, 1995   LUMBAR LAMINECTOMY  12/2010   LUMBAR LAMINECTOMY/DECOMPRESSION MICRODISCECTOMY Left 08/15/2016    Procedure: Left Left three- four Redo laminectomy;  Surgeon: Erline Levine, MD;  Location: [place];  Service: Neurosurgery;  Laterality: Left;  Left L3-4 Redo laminectomy   LUMBAR PERCUTANEOUS PEDICLE SCREW 2 LEVEL N/A 04/18/2016   Procedure: LUMBAR THREE-FOUR, LUMBAR FOUR-FIVE PERCUTANEOUS PEDICLE SCREW;  Surgeon: Erline Levine, MD;  Location: [place];  Service: Neurosurgery;  Laterality: N/A;   TOTAL HIP ARTHROPLASTY Left 04/22/2017   TOTAL HIP ARTHROPLASTY Left 04/22/2017   Procedure: TOTAL HIP ARTHROPLASTY ANTERIOR APPROACH;  Surgeon: Melrose Nakayama, MD;  Location: [place];  Service: Orthopedics;  Laterality: Left;    Current Facility-Administered Medications  Medication Dose Route Frequency Provider Last Rate Last Admin   [START ON 11/21/2020] bupivacaine liposome (EXPAREL) 1.3 % injection 266 mg  20 mL Other Once Melrose Nakayama, MD       Derrill Memo ON 11/21/2020] tranexamic acid (CYKLOKAPRON) 2,000 mg in sodium chloride 0.9 % 50 mL Topical Application  9,735 mg Topical To OR Melrose Nakayama, MD       Derrill Memo ON 11/21/2020] vancomycin (VANCOREADY) IVPB 1500 mg/300 mL  1,500 mg Intravenous On Call to [place], MD       Current Outpatient Medications  Medication Sig Dispense Refill Last Dose   ARTIFICIAL TEAR OP Place 1 drop into both eyes daily as needed (dry eyes).      carvedilol (COREG) 3.125 MG tablet Take 3.125 mg by mouth at bedtime.       clonazePAM (KLONOPIN) 0.5 MG tablet take 1/2-1 TABLET BY MOUTH AT BEDTIME AS NEEDED (Patient taking differently: Take 0.25-0.5 mg by mouth at bedtime as needed (sleep).) 30 tablet 5    DULoxetine (CYMBALTA) 60 MG capsule Take 1 capsule (60 mg total) by mouth daily. 90 capsule 4    ferrous sulfate 324 (65 Fe) MG TBEC Take 324 mg by mouth daily.      ibuprofen (ADVIL) 200 MG tablet Take 400 mg by mouth every 6 (six) hours as needed.      lisinopril (ZESTRIL) 5 MG tablet Take 5 mg by mouth at bedtime.      methocarbamol (ROBAXIN) 750 MG tablet Take 750 mg  by mouth at bedtime.      nitroGLYCERIN (NITROSTAT) 0.4 MG SL tablet Place 0.4 mg under the tongue every 5 (five) minutes as needed for chest pain.      omeprazole (PRILOSEC) 20 MG capsule Take 20 mg by mouth daily.      polyethylene glycol (MIRALAX / GLYCOLAX) packet Take 17 g by mouth daily as needed for mild constipation.      pregabalin (LYRICA) 100 MG capsule TAKE THREE CAPSULES BY MOUTH EVERY EVENING (Patient taking differently: Take 300 mg by mouth every evening.) 270 capsule 1    rOPINIRole (REQUIP) 0.5 MG tablet Take 1 tablet (0.5 mg total) by mouth at bedtime. 30 tablet 11    rosuvastatin (CRESTOR) 10 MG tablet TAKE 1 TABLET ONCE DAILY OR AS DIRECTED. (Patient taking differently: Take 10 mg by mouth every evening.) 90 tablet 2    tamsulosin (FLOMAX) 0.4 MG CAPS capsule Take 0.4 mg by mouth daily.  ELIQUIS 5 MG TABS tablet TAKE ONE TABLET BY MOUTH EVERY MORNING and TAKE ONE TABLET BY MOUTH EVERY EVENING (Patient taking differently: Take 5 mg by mouth 2 (two) times daily.) 180 tablet 1    Multiple Vitamins-Minerals (MULTIVITAMIN PO) Take 1 tablet by mouth daily.      Allergies  Allergen Reactions   Compazine [Prochlorperazine Edisylate] Other (See Comments)    EXTRAPYRAMIDAL MOVEMENT [Involuntary muscle movement]   Penicillins Nausea And Vomiting, Rash and Other (See Comments)    Has patient had a PCN reaction causing immediate rash, facial/tongue/throat swelling, SOB or lightheadedness with hypotension: #  #  #  YES  #  #  #  Has patient had a PCN reaction causing severe rash involving mucus membranes or skin necrosis: No Has patient had a PCN reaction that required hospitalization No Has patient had a PCN reaction occurring within the last 10 years: No If all of the above answers are "NO", then may proceed with Cephalosporin use.    Welchol [Colesevelam Hcl] Other (See Comments)    MYALGIAS [Leg aches]   Gemfibrozil Nausea Only    PATIENT TOLERATES   Red Yeast Rice [Cholestin]  Other (See Comments)    flushing    Social History   Tobacco Use   Smoking status: Some Days    Pack years: 0.00    Types: Pipe   Smokeless tobacco: Never  Substance Use Topics   Alcohol use: Yes    Alcohol/week: 1.0 standard drink    Types: 1 Shots of liquor per week    Comment: 1-3 weekly    Family History  Problem Relation Age of Onset   Prostate cancer Father    Heart attack Brother    Kidney cancer Brother    Brain cancer Brother      Review of Systems  Musculoskeletal:  Positive for arthralgias.       Right knee   All other systems reviewed and are negative.  Objective:  Physical Exam Constitutional:      Appearance: Normal appearance.  HENT:     Head: Normocephalic and atraumatic.     Nose: Nose normal.     Mouth/Throat:     Pharynx: Oropharynx is clear.  Eyes:     Extraocular Movements: Extraocular movements intact.  Cardiovascular:     Rate and Rhythm: Normal rate and regular rhythm.  Pulmonary:     Effort: Pulmonary effort is normal.  Abdominal:     Palpations: Abdomen is soft.  Musculoskeletal:     Cervical back: Normal range of motion.     Comments: Right knee motion remains about 0-110.  He has medial joint line pain and crepitation with no scars.  I do not feel an effusion.  Hip motion is good on both sides and straight leg raise is negative.  Sensation and motor function are intact in his feet with palpable pulses on both sides.    Skin:    General: Skin is warm and dry.  Neurological:     General: No focal deficit present.     Mental Status: He is alert and oriented to person, place, and time.  Psychiatric:        Mood and Affect: Mood normal.        Behavior: Behavior normal.        Thought Content: Thought content normal.        Judgment: Judgment normal.    Vital signs in last 24 hours:    Labs:  Estimated body mass index is 38.45 kg/m as calculated from the following:   Height as of 11/14/20: 5\' 10"  (1.778 m).   Weight as of  11/14/20: 121.6 kg.   Imaging Review Plain radiographs demonstrate severe degenerative joint disease of the right knee(s). The overall alignment isneutral. The bone quality appears to be excellent for age and reported activity level.   Assessment/Plan:  End stage primary arthritis, right knee   The patient history, physical examination, clinical judgment of the provider and imaging studies are consistent with end stage degenerative joint disease of the right knee(s) and total knee arthroplasty is deemed medically necessary. The treatment options including medical management, injection therapy arthroscopy and arthroplasty were discussed at length. The risks and benefits of total knee arthroplasty were presented and reviewed. The risks due to aseptic loosening, infection, stiffness, patella tracking problems, thromboembolic complications and other imponderables were discussed. The patient acknowledged the explanation, agreed to proceed with the plan and consent was signed. Patient is being admitted for inpatient treatment for surgery, pain control, PT, OT, prophylactic antibiotics, VTE prophylaxis, progressive ambulation and ADL's and discharge planning. The patient is planning to be discharged home with home health services   Patient's anticipated LOS is less than 2 midnights, meeting these requirements: - Younger than 64 - Lives within 1 hour of care - Has a competent adult at home to recover with post-op recover - NO history of  - Chronic pain requiring opiods  - Diabetes  - Coronary Artery Disease  - Heart failure  - Heart attack  - Stroke  - DVT/VTE  - Cardiac arrhythmia  - Respiratory Failure/COPD  - Renal failure  - Anemia  - Advanced Liver disease

## 2020-11-21 ENCOUNTER — Inpatient Hospital Stay (HOSPITAL_COMMUNITY)
Admission: RE | Admit: 2020-11-21 | Discharge: 2020-11-22 | DRG: 470 | Disposition: A | Discharge status: Home Health | Payer: Medicare Other | Source: Ambulatory Visit | Attending: Orthopaedic Surgery | Admitting: Orthopaedic Surgery

## 2020-11-21 ENCOUNTER — Inpatient Hospital Stay (HOSPITAL_COMMUNITY): Payer: Medicare Other | Admitting: Physician Assistant

## 2020-11-21 ENCOUNTER — Other Ambulatory Visit: Payer: Self-pay

## 2020-11-21 ENCOUNTER — Encounter (HOSPITAL_COMMUNITY)
Admission: RE | Disposition: A | Discharge status: Home Health | Payer: Self-pay | Source: Ambulatory Visit | Attending: Orthopaedic Surgery

## 2020-11-21 ENCOUNTER — Encounter (HOSPITAL_COMMUNITY): Payer: Self-pay | Admitting: Orthopaedic Surgery

## 2020-11-21 ENCOUNTER — Inpatient Hospital Stay (HOSPITAL_COMMUNITY): Payer: Medicare Other | Admitting: Anesthesiology

## 2020-11-21 DIAGNOSIS — H9190 Unspecified hearing loss, unspecified ear: Secondary | ICD-10-CM | POA: Diagnosis not present

## 2020-11-21 DIAGNOSIS — Z880 Allergy status to penicillin: Secondary | ICD-10-CM

## 2020-11-21 DIAGNOSIS — K219 Gastro-esophageal reflux disease without esophagitis: Secondary | ICD-10-CM | POA: Diagnosis present

## 2020-11-21 DIAGNOSIS — Z981 Arthrodesis status: Secondary | ICD-10-CM

## 2020-11-21 DIAGNOSIS — N183 Chronic kidney disease, stage 3 unspecified: Secondary | ICD-10-CM | POA: Diagnosis not present

## 2020-11-21 DIAGNOSIS — M1990 Unspecified osteoarthritis, unspecified site: Secondary | ICD-10-CM | POA: Diagnosis not present

## 2020-11-21 DIAGNOSIS — Z79899 Other long term (current) drug therapy: Secondary | ICD-10-CM

## 2020-11-21 DIAGNOSIS — Z888 Allergy status to other drugs, medicaments and biological substances status: Secondary | ICD-10-CM | POA: Diagnosis not present

## 2020-11-21 DIAGNOSIS — Z8249 Family history of ischemic heart disease and other diseases of the circulatory system: Secondary | ICD-10-CM | POA: Diagnosis not present

## 2020-11-21 DIAGNOSIS — E782 Mixed hyperlipidemia: Secondary | ICD-10-CM | POA: Diagnosis present

## 2020-11-21 DIAGNOSIS — I129 Hypertensive chronic kidney disease with stage 1 through stage 4 chronic kidney disease, or unspecified chronic kidney disease: Secondary | ICD-10-CM | POA: Diagnosis present

## 2020-11-21 DIAGNOSIS — G8918 Other acute postprocedural pain: Secondary | ICD-10-CM | POA: Diagnosis not present

## 2020-11-21 DIAGNOSIS — M1711 Unilateral primary osteoarthritis, right knee: Secondary | ICD-10-CM | POA: Diagnosis not present

## 2020-11-21 DIAGNOSIS — Z91018 Allergy to other foods: Secondary | ICD-10-CM | POA: Diagnosis not present

## 2020-11-21 DIAGNOSIS — G2581 Restless legs syndrome: Secondary | ICD-10-CM | POA: Diagnosis not present

## 2020-11-21 DIAGNOSIS — M25761 Osteophyte, right knee: Secondary | ICD-10-CM | POA: Diagnosis present

## 2020-11-21 DIAGNOSIS — I4819 Other persistent atrial fibrillation: Secondary | ICD-10-CM | POA: Diagnosis not present

## 2020-11-21 DIAGNOSIS — F339 Major depressive disorder, recurrent, unspecified: Secondary | ICD-10-CM | POA: Diagnosis not present

## 2020-11-21 DIAGNOSIS — Z7901 Long term (current) use of anticoagulants: Secondary | ICD-10-CM

## 2020-11-21 HISTORY — PX: TOTAL KNEE ARTHROPLASTY: SHX125

## 2020-11-21 LAB — TYPE AND SCREEN
ABO/RH(D): O POS
Antibody Screen: NEGATIVE

## 2020-11-21 SURGERY — ARTHROPLASTY, KNEE, TOTAL
Anesthesia: Monitor Anesthesia Care | Site: Knee | Laterality: Right

## 2020-11-21 MED ORDER — SODIUM CHLORIDE (PF) 0.9 % IJ SOLN
INTRAMUSCULAR | Status: AC
  Filled 2020-11-21: qty 30

## 2020-11-21 MED ORDER — LACTATED RINGERS IV SOLN: INTRAVENOUS | Status: DC

## 2020-11-21 MED ORDER — MORPHINE SULFATE (PF) 2 MG/ML IV SOLN
0.5000 mg | INTRAVENOUS | Status: DC | PRN
  Administered 2020-11-21: 1 mg via INTRAVENOUS
  Filled 2020-11-21: qty 1

## 2020-11-21 MED ORDER — KETOROLAC TROMETHAMINE 15 MG/ML IJ SOLN
7.5000 mg | Freq: Four times a day (QID) | INTRAMUSCULAR | Status: DC
  Administered 2020-11-21 – 2020-11-22 (×3): 7.5 mg via INTRAVENOUS
  Filled 2020-11-21 (×3): qty 1

## 2020-11-21 MED ORDER — SODIUM CHLORIDE (PF) 0.9 % IJ SOLN
INTRAMUSCULAR | Status: DC | PRN
  Administered 2020-11-21: 30 mL

## 2020-11-21 MED ORDER — MEPIVACAINE HCL (PF) 2 % IJ SOLN
INTRAMUSCULAR | Status: DC | PRN
  Administered 2020-11-21: 3.2 mL via INTRATHECAL

## 2020-11-21 MED ORDER — FENTANYL CITRATE (PF) 100 MCG/2ML IJ SOLN
INTRAMUSCULAR | Status: DC | PRN
  Administered 2020-11-21: 50 ug via INTRAVENOUS

## 2020-11-21 MED ORDER — DULOXETINE HCL 60 MG PO CPEP
60.0000 mg | ORAL_CAPSULE | Freq: Every day | ORAL | Status: DC
  Administered 2020-11-22: 60 mg via ORAL
  Filled 2020-11-21: qty 1

## 2020-11-21 MED ORDER — HYDROCODONE-ACETAMINOPHEN 5-325 MG PO TABS
1.0000 | ORAL_TABLET | ORAL | Status: DC | PRN
  Administered 2020-11-22: 2 via ORAL
  Filled 2020-11-21: qty 2

## 2020-11-21 MED ORDER — MENTHOL 3 MG MT LOZG: 1.0000 | LOZENGE | OROMUCOSAL | Status: DC | PRN

## 2020-11-21 MED ORDER — ROPINIROLE HCL 0.5 MG PO TABS
0.5000 mg | ORAL_TABLET | Freq: Every day | ORAL | Status: DC
  Administered 2020-11-21: 0.5 mg via ORAL
  Filled 2020-11-21: qty 1

## 2020-11-21 MED ORDER — PROPOFOL 10 MG/ML IV BOLUS
INTRAVENOUS | Status: AC
  Filled 2020-11-21: qty 20

## 2020-11-21 MED ORDER — ACETAMINOPHEN 500 MG PO TABS
500.0000 mg | ORAL_TABLET | Freq: Four times a day (QID) | ORAL | Status: DC
  Administered 2020-11-21 – 2020-11-22 (×3): 500 mg via ORAL
  Filled 2020-11-21 (×3): qty 1

## 2020-11-21 MED ORDER — NITROGLYCERIN 0.4 MG SL SUBL: 0.4000 mg | SUBLINGUAL_TABLET | SUBLINGUAL | Status: DC | PRN

## 2020-11-21 MED ORDER — ACETAMINOPHEN 10 MG/ML IV SOLN: 1000.0000 mg | Freq: Once | INTRAVENOUS | Status: DC | PRN

## 2020-11-21 MED ORDER — CEFAZOLIN SODIUM-DEXTROSE 2-4 GM/100ML-% IV SOLN
2.0000 g | Freq: Once | INTRAVENOUS | Status: AC
  Administered 2020-11-21: 2 g via INTRAVENOUS

## 2020-11-21 MED ORDER — PHENYLEPHRINE HCL (PRESSORS) 10 MG/ML IV SOLN
INTRAVENOUS | Status: DC | PRN
  Administered 2020-11-21 (×2): 160 ug via INTRAVENOUS

## 2020-11-21 MED ORDER — TRANEXAMIC ACID 1000 MG/10ML IV SOLN
INTRAVENOUS | Status: DC | PRN
  Administered 2020-11-21: 2000 mg via TOPICAL

## 2020-11-21 MED ORDER — LISINOPRIL 5 MG PO TABS
5.0000 mg | ORAL_TABLET | Freq: Every day | ORAL | Status: DC
  Administered 2020-11-21: 5 mg via ORAL
  Filled 2020-11-21: qty 1

## 2020-11-21 MED ORDER — METOCLOPRAMIDE HCL 5 MG/ML IJ SOLN: 5.0000 mg | Freq: Three times a day (TID) | INTRAMUSCULAR | Status: DC | PRN

## 2020-11-21 MED ORDER — PHENYLEPHRINE HCL-NACL 10-0.9 MG/250ML-% IV SOLN
INTRAVENOUS | Status: DC | PRN
  Administered 2020-11-21: 100 ug/min via INTRAVENOUS

## 2020-11-21 MED ORDER — ONDANSETRON HCL 4 MG/2ML IJ SOLN
INTRAMUSCULAR | Status: DC | PRN
  Administered 2020-11-21: 4 mg via INTRAVENOUS

## 2020-11-21 MED ORDER — 0.9 % SODIUM CHLORIDE (POUR BTL) OPTIME
TOPICAL | Status: DC | PRN
  Administered 2020-11-21: 1000 mL

## 2020-11-21 MED ORDER — DEXAMETHASONE SODIUM PHOSPHATE 10 MG/ML IJ SOLN
INTRAMUSCULAR | Status: AC
  Filled 2020-11-21: qty 1

## 2020-11-21 MED ORDER — BUPIVACAINE LIPOSOME 1.3 % IJ SUSP
INTRAMUSCULAR | Status: DC | PRN
  Administered 2020-11-21: 20 mL

## 2020-11-21 MED ORDER — ONDANSETRON HCL 4 MG PO TABS: 4.0000 mg | ORAL_TABLET | Freq: Four times a day (QID) | ORAL | Status: DC | PRN

## 2020-11-21 MED ORDER — ROPIVACAINE HCL 7.5 MG/ML IJ SOLN
INTRAMUSCULAR | Status: DC | PRN
  Administered 2020-11-21: 20 mL via PERINEURAL

## 2020-11-21 MED ORDER — SODIUM CHLORIDE 0.9% IV SOLUTION
INTRAVENOUS | Status: AC | PRN
  Administered 2020-11-21: 2000 mL via INTRAMUSCULAR

## 2020-11-21 MED ORDER — TRANEXAMIC ACID-NACL 1000-0.7 MG/100ML-% IV SOLN
1000.0000 mg | Freq: Once | INTRAVENOUS | Status: AC
  Administered 2020-11-21: 1000 mg via INTRAVENOUS
  Filled 2020-11-21: qty 100

## 2020-11-21 MED ORDER — ONDANSETRON HCL 4 MG/2ML IJ SOLN: 4.0000 mg | Freq: Four times a day (QID) | INTRAMUSCULAR | Status: DC | PRN

## 2020-11-21 MED ORDER — DIPHENHYDRAMINE HCL 12.5 MG/5ML PO ELIX: 12.5000 mg | ORAL_SOLUTION | ORAL | Status: DC | PRN

## 2020-11-21 MED ORDER — CARVEDILOL 3.125 MG PO TABS
3.1250 mg | ORAL_TABLET | Freq: Every day | ORAL | Status: DC
  Filled 2020-11-21: qty 1

## 2020-11-21 MED ORDER — BUPIVACAINE-EPINEPHRINE (PF) 0.25% -1:200000 IJ SOLN
INTRAMUSCULAR | Status: AC
  Filled 2020-11-21: qty 30

## 2020-11-21 MED ORDER — ONDANSETRON HCL 4 MG/2ML IJ SOLN: 4.0000 mg | Freq: Once | INTRAMUSCULAR | Status: DC | PRN

## 2020-11-21 MED ORDER — FENTANYL CITRATE (PF) 100 MCG/2ML IJ SOLN: 25.0000 ug | INTRAMUSCULAR | Status: DC | PRN

## 2020-11-21 MED ORDER — PHENYLEPHRINE HCL (PRESSORS) 10 MG/ML IV SOLN
INTRAVENOUS | Status: AC
  Filled 2020-11-21: qty 1

## 2020-11-21 MED ORDER — FENTANYL CITRATE (PF) 100 MCG/2ML IJ SOLN
50.0000 ug | INTRAMUSCULAR | Status: DC
Start: 2020-11-21 — End: 2020-11-21
  Administered 2020-11-21: 50 ug via INTRAVENOUS
  Filled 2020-11-21: qty 2

## 2020-11-21 MED ORDER — ROSUVASTATIN CALCIUM 10 MG PO TABS
10.0000 mg | ORAL_TABLET | Freq: Every evening | ORAL | Status: DC
  Administered 2020-11-21: 10 mg via ORAL
  Filled 2020-11-21: qty 1

## 2020-11-21 MED ORDER — PROPOFOL 500 MG/50ML IV EMUL
INTRAVENOUS | Status: DC | PRN
  Administered 2020-11-21: 75 ug/kg/min via INTRAVENOUS

## 2020-11-21 MED ORDER — DOCUSATE SODIUM 100 MG PO CAPS
100.0000 mg | ORAL_CAPSULE | Freq: Two times a day (BID) | ORAL | Status: DC
  Administered 2020-11-21 – 2020-11-22 (×2): 100 mg via ORAL
  Filled 2020-11-21 (×2): qty 1

## 2020-11-21 MED ORDER — PROPOFOL 1000 MG/100ML IV EMUL
INTRAVENOUS | Status: AC
  Filled 2020-11-21: qty 100

## 2020-11-21 MED ORDER — PREGABALIN 100 MG PO CAPS
300.0000 mg | ORAL_CAPSULE | Freq: Every evening | ORAL | Status: DC
  Administered 2020-11-21: 300 mg via ORAL
  Filled 2020-11-21: qty 3

## 2020-11-21 MED ORDER — BUPIVACAINE-EPINEPHRINE (PF) 0.25% -1:200000 IJ SOLN
INTRAMUSCULAR | Status: DC | PRN
  Administered 2020-11-21: 30 mL

## 2020-11-21 MED ORDER — MIDAZOLAM HCL 2 MG/2ML IJ SOLN
1.0000 mg | INTRAMUSCULAR | Status: DC
  Administered 2020-11-21: 2 mg via INTRAVENOUS
  Filled 2020-11-21: qty 2

## 2020-11-21 MED ORDER — CEFAZOLIN SODIUM-DEXTROSE 2-4 GM/100ML-% IV SOLN
2.0000 g | Freq: Four times a day (QID) | INTRAVENOUS | Status: AC
  Administered 2020-11-21 (×2): 2 g via INTRAVENOUS
  Filled 2020-11-21 (×2): qty 100

## 2020-11-21 MED ORDER — ACETAMINOPHEN 325 MG PO TABS: 325.0000 mg | ORAL_TABLET | Freq: Four times a day (QID) | ORAL | Status: DC | PRN

## 2020-11-21 MED ORDER — PROPOFOL 10 MG/ML IV BOLUS
INTRAVENOUS | Status: DC | PRN
  Administered 2020-11-21: 20 mg via INTRAVENOUS

## 2020-11-21 MED ORDER — CEFAZOLIN SODIUM-DEXTROSE 2-4 GM/100ML-% IV SOLN
INTRAVENOUS | Status: AC
  Filled 2020-11-21: qty 100

## 2020-11-21 MED ORDER — DEXAMETHASONE SODIUM PHOSPHATE 10 MG/ML IJ SOLN
INTRAMUSCULAR | Status: DC | PRN
  Administered 2020-11-21: 10 mg via INTRAVENOUS

## 2020-11-21 MED ORDER — METOCLOPRAMIDE HCL 5 MG PO TABS: 5.0000 mg | ORAL_TABLET | Freq: Three times a day (TID) | ORAL | Status: DC | PRN

## 2020-11-21 MED ORDER — FENTANYL CITRATE (PF) 100 MCG/2ML IJ SOLN
INTRAMUSCULAR | Status: AC
  Filled 2020-11-21: qty 2

## 2020-11-21 MED ORDER — PANTOPRAZOLE SODIUM 40 MG PO TBEC
40.0000 mg | DELAYED_RELEASE_TABLET | Freq: Every day | ORAL | Status: DC
  Administered 2020-11-22: 40 mg via ORAL
  Filled 2020-11-21: qty 1

## 2020-11-21 MED ORDER — BISACODYL 5 MG PO TBEC: 5.0000 mg | DELAYED_RELEASE_TABLET | Freq: Every day | ORAL | Status: DC | PRN

## 2020-11-21 MED ORDER — ALUM & MAG HYDROXIDE-SIMETH 200-200-20 MG/5ML PO SUSP: 30.0000 mL | ORAL | Status: DC | PRN

## 2020-11-21 MED ORDER — METHOCARBAMOL 1000 MG/10ML IJ SOLN
500.0000 mg | Freq: Four times a day (QID) | INTRAVENOUS | Status: DC | PRN
  Filled 2020-11-21: qty 5

## 2020-11-21 MED ORDER — PHENOL 1.4 % MT LIQD: 1.0000 | OROMUCOSAL | Status: DC | PRN

## 2020-11-21 MED ORDER — HYDROCODONE-ACETAMINOPHEN 7.5-325 MG PO TABS
1.0000 | ORAL_TABLET | ORAL | Status: DC | PRN
  Administered 2020-11-21 (×2): 2 via ORAL
  Filled 2020-11-21 (×2): qty 2

## 2020-11-21 MED ORDER — ONDANSETRON HCL 4 MG/2ML IJ SOLN
INTRAMUSCULAR | Status: AC
  Filled 2020-11-21: qty 2

## 2020-11-21 MED ORDER — TRANEXAMIC ACID-NACL 1000-0.7 MG/100ML-% IV SOLN
1000.0000 mg | INTRAVENOUS | Status: AC
  Administered 2020-11-21: 1000 mg via INTRAVENOUS
  Filled 2020-11-21: qty 100

## 2020-11-21 MED ORDER — DEXAMETHASONE SODIUM PHOSPHATE 10 MG/ML IJ SOLN
INTRAMUSCULAR | Status: DC | PRN
  Administered 2020-11-21: 5 mg

## 2020-11-21 MED ORDER — CLONAZEPAM 0.125 MG PO TBDP: 0.2500 mg | ORAL_TABLET | Freq: Every evening | ORAL | Status: DC | PRN

## 2020-11-21 MED ORDER — CHLORHEXIDINE GLUCONATE 0.12 % MT SOLN
15.0000 mL | Freq: Once | OROMUCOSAL | Status: AC
  Administered 2020-11-21: 15 mL via OROMUCOSAL

## 2020-11-21 MED ORDER — APIXABAN 5 MG PO TABS
5.0000 mg | ORAL_TABLET | Freq: Two times a day (BID) | ORAL | Status: DC
  Administered 2020-11-22: 5 mg via ORAL
  Filled 2020-11-21: qty 1

## 2020-11-21 MED ORDER — TAMSULOSIN HCL 0.4 MG PO CAPS
0.4000 mg | ORAL_CAPSULE | Freq: Every day | ORAL | Status: DC
  Administered 2020-11-22: 0.4 mg via ORAL
  Filled 2020-11-21: qty 1

## 2020-11-21 MED ORDER — POVIDONE-IODINE 10 % EX SWAB
2.0000 "application " | Freq: Once | CUTANEOUS | Status: AC
  Administered 2020-11-21: 2 via TOPICAL

## 2020-11-21 MED ORDER — POLYETHYLENE GLYCOL 3350 17 G PO PACK: 17.0000 g | PACK | Freq: Every day | ORAL | Status: DC | PRN

## 2020-11-21 MED ORDER — METHOCARBAMOL 500 MG PO TABS
500.0000 mg | ORAL_TABLET | Freq: Four times a day (QID) | ORAL | Status: DC | PRN
  Administered 2020-11-21 – 2020-11-22 (×2): 500 mg via ORAL
  Filled 2020-11-21 (×2): qty 1

## 2020-11-21 SURGICAL SUPPLY — 54 items
ATTUNE MED DOME PAT 41 KNEE (Knees) ×2 IMPLANT
ATTUNE PS FEM RT SZ 7 CEM KNEE (Femur) ×2 IMPLANT
ATTUNE PSRP INSR SZ7 8 KNEE (Insert) ×2 IMPLANT
BAG DECANTER FOR FLEXI CONT (MISCELLANEOUS) ×2 IMPLANT
BAG SPEC THK2 15X12 ZIP CLS (MISCELLANEOUS) ×1
BAG ZIPLOCK 12X15 (MISCELLANEOUS) ×2 IMPLANT
BASE TIBIAL ROT PLAT SZ 8 KNEE (Knees) ×1 IMPLANT
BLADE SAGITTAL 25.0X1.19X90 (BLADE) ×2 IMPLANT
BLADE SAW SGTL 11.0X1.19X90.0M (BLADE) ×2 IMPLANT
BLADE SURG SZ10 CARB STEEL (BLADE) ×2 IMPLANT
BNDG ELASTIC 6X5.8 VLCR STR LF (GAUZE/BANDAGES/DRESSINGS) ×2 IMPLANT
BOOTIES KNEE HIGH SLOAN (MISCELLANEOUS) ×2 IMPLANT
BOWL SMART MIX CTS (DISPOSABLE) ×2 IMPLANT
BSPLAT TIB 8 CMNT ROT PLAT STR (Knees) ×1 IMPLANT
CEMENT HV SMART SET (Cement) ×4 IMPLANT
COVER SURGICAL LIGHT HANDLE (MISCELLANEOUS) ×2 IMPLANT
COVER WAND RF STERILE (DRAPES) ×2 IMPLANT
CUFF TOURN SGL QUICK 34 (TOURNIQUET CUFF) ×2
CUFF TRNQT CYL 34X4.125X (TOURNIQUET CUFF) ×1 IMPLANT
DECANTER SPIKE VIAL GLASS SM (MISCELLANEOUS) ×4 IMPLANT
DRAPE SHEET LG 3/4 BI-LAMINATE (DRAPES) ×2 IMPLANT
DRAPE U-SHAPE 47X51 STRL (DRAPES) ×2 IMPLANT
DRSG AQUACEL AG ADV 3.5X10 (GAUZE/BANDAGES/DRESSINGS) ×2 IMPLANT
DURAPREP 26ML APPLICATOR (WOUND CARE) ×4 IMPLANT
ELECT REM PT RETURN 15FT ADLT (MISCELLANEOUS) ×2 IMPLANT
GLOVE SRG 8 PF TXTR STRL LF DI (GLOVE) ×2 IMPLANT
GLOVE SURG ENC MOIS LTX SZ8 (GLOVE) ×4 IMPLANT
GLOVE SURG UNDER POLY LF SZ8 (GLOVE) ×4
GOWN STRL REUS W/TWL XL LVL3 (GOWN DISPOSABLE) ×4 IMPLANT
HANDPIECE INTERPULSE COAX TIP (DISPOSABLE) ×2
HOLDER FOLEY CATH W/STRAP (MISCELLANEOUS) IMPLANT
HOOD PEEL AWAY FLYTE STAYCOOL (MISCELLANEOUS) ×6 IMPLANT
KIT TURNOVER KIT A (KITS) ×2 IMPLANT
MANIFOLD NEPTUNE II (INSTRUMENTS) ×2 IMPLANT
NEEDLE HYPO 22GX1.5 SAFETY (NEEDLE) ×2 IMPLANT
NS IRRIG 1000ML POUR BTL (IV SOLUTION) ×2 IMPLANT
PACK TOTAL KNEE CUSTOM (KITS) ×2 IMPLANT
PAD ARMBOARD 7.5X6 YLW CONV (MISCELLANEOUS) ×2 IMPLANT
PENCIL SMOKE EVACUATOR (MISCELLANEOUS) IMPLANT
PIN DRILL FIX HALF THREAD (BIT) ×2 IMPLANT
PIN STEINMAN FIXATION KNEE (PIN) ×2 IMPLANT
PROTECTOR NERVE ULNAR (MISCELLANEOUS) ×2 IMPLANT
SET HNDPC FAN SPRY TIP SCT (DISPOSABLE) ×1 IMPLANT
SUT ETHIBOND NAB CT1 #1 30IN (SUTURE) ×4 IMPLANT
SUT VIC AB 0 CT1 36 (SUTURE) ×2 IMPLANT
SUT VIC AB 2-0 CT1 27 (SUTURE) ×2
SUT VIC AB 2-0 CT1 TAPERPNT 27 (SUTURE) ×1 IMPLANT
SUT VICRYL AB 3-0 FS1 BRD 27IN (SUTURE) ×2 IMPLANT
SUT VLOC 180 0 24IN GS25 (SUTURE) ×2 IMPLANT
TIBIAL BASE ROT PLAT SZ 8 KNEE (Knees) ×2 IMPLANT
TRAY FOLEY MTR SLVR 16FR STAT (SET/KITS/TRAYS/PACK) IMPLANT
WATER STERILE IRR 1000ML POUR (IV SOLUTION) ×2 IMPLANT
WRAP KNEE MAXI GEL POST OP (GAUZE/BANDAGES/DRESSINGS) ×2 IMPLANT
YANKAUER SUCT BULB TIP NO VENT (SUCTIONS) ×2 IMPLANT

## 2020-11-21 NOTE — Interval H&P Note (Signed)
History and Physical Interval Note:  11/21/2020 8:57 AM  [name]  has presented today for surgery, with the diagnosis of RIGHT KNEE DEGENERATIVE JOINT DISEASE.  The various methods of treatment have been discussed with the patient and family. After consideration of risks, benefits and other options for treatment, the patient has consented to  Procedure(s): RIGHT TOTAL KNEE ARTHROPLASTY (Right) as a surgical intervention.  [name] history has been reviewed, patient examined, no change in status, stable for surgery.  I have reviewed the patient's chart and labs.  Questions were answered to the patient's satisfaction.     [name]

## 2020-11-21 NOTE — Evaluation (Signed)
Physical Therapy Evaluation Patient Details Name: [name] MRN: [id number] DOB: Dec 23, 1940 Today's Date: 11/21/2020   History of Present Illness  80 yo male, S/P Right TKA.PMH:  L THA, back surgery, afib/flutter, depression.  Clinical Impression  Patient   mobilized  and ambulated x 40' using RW with min assistance. Patient's daughter present. Patient reported  pain improved after getting out of bed. Patient's family may not be available 24/7 so patient may benefit from  extra nigh, depending on who is available to assist patient.  Patient states that he will have HHPT x 5 , then OPPT.  Pt admitted with above diagnosis.  Pt currently with functional limitations due to the deficits listed below (see PT Problem List). Pt will benefit from skilled PT to increase their independence and safety with mobility to allow discharge to the venue listed below.      Follow Up Recommendations Follow surgeon's recommendation for DC plan and follow-up therapies;Home health PT (for 5 visits then OPPT)    Equipment Recommendations  None recommended by PT    Recommendations for Other Services       Precautions / Restrictions Precautions Precautions: Fall Precaution Comments: BP runs low Restrictions Weight Bearing Restrictions: No      Mobility  Bed Mobility Overal bed mobility: Needs Assistance Bed Mobility: Supine to Sit     Supine to sit: Mod assist;HOB elevated     General bed mobility comments: assist with LLE and trunk, use of rail    Transfers Overall transfer level: Needs assistance Equipment used: Rolling walker (2 wheeled) Transfers: Sit to/from Stand Sit to Stand: Min assist         General transfer comment: cues for hand and  right leg position  Ambulation/Gait Ambulation/Gait assistance: Min assist;+2 safety/equipment Gait Distance (Feet): 40 Feet Assistive device: Rolling walker (2 wheeled) Gait Pattern/deviations: Step-to pattern;Antalgic Gait velocity: decr    General Gait Details: cues for sequence and use of RW  Stairs            Wheelchair Mobility    Modified Rankin (Stroke Patients Only)       Balance Overall balance assessment: Needs assistance Sitting-balance support: Feet supported;No upper extremity supported Sitting balance-Leahy Scale: Good     Standing balance support: During functional activity;Bilateral upper extremity supported Standing balance-Leahy Scale: Fair Standing balance comment: requires RW                             Pertinent Vitals/Pain Pain Assessment: 0-10 Pain Score: 7  Pain Location: dropped to a 4 with mobility Pain Descriptors / Indicators: Discomfort;Aching Pain Intervention(s): Premedicated before session;Monitored during session;Ice applied;Limited activity within patient's tolerance    Home Living Family/patient expects to be discharged to:: Private residence Living Arrangements: Alone Available Help at Discharge: Family;Friend(s);Available PRN/intermittently Type of Home: House Home Access: Stairs to enter Entrance Stairs-Rails: Left Entrance Stairs-Number of Steps: 4 Home Layout: One level Home Equipment: Walker - 2 wheels;Walker - 4 wheels;Cane - single point;Bedside commode;Shower seat Additional Comments: son can stay first night, may be home alone after that, family trying to  get coverage 24/7    Prior Function Level of Independence: Independent with assistive device(s)         Comments: using cane     Hand Dominance   Dominant Hand: Right    Extremity/Trunk Assessment   Upper Extremity Assessment Upper Extremity Assessment: Overall WFL for tasks assessed    Lower Extremity  Assessment Lower Extremity Assessment: RLE deficits/detail RLE Deficits / Details: able to raise leg, knee flexion to ~ 60 *    Cervical / Trunk Assessment Cervical / Trunk Assessment: Normal  Communication   Communication: No difficulties  Cognition Arousal/Alertness:  Awake/alert Behavior During Therapy: WFL for tasks assessed/performed Overall Cognitive Status: Within Functional Limits for tasks assessed                                        General Comments      Exercises Total Joint Exercises Ankle Circles/Pumps: AROM;Both;10 reps Quad Sets: AROM;Both;10 reps   Assessment/Plan    PT Assessment Patient needs continued PT services  PT Problem List Decreased strength;Decreased mobility;Decreased range of motion;Decreased safety awareness;Decreased activity tolerance;Decreased balance       PT Treatment Interventions DME instruction;Therapeutic activities;Gait training;Therapeutic exercise;Patient/family education;Stair training;Functional mobility training    PT Goals (Current goals can be found in the Care Plan section)  Acute Rehab PT Goals Patient Stated Goal: go home PT Goal Formulation: With patient/family Time For Goal Achievement: 11/28/20 Potential to Achieve Goals: Good    Frequency 7X/week   Barriers to discharge Decreased caregiver support family  is limited in coverage 24/7    Co-evaluation               AM-PAC PT "6 Clicks" Mobility  Outcome Measure Help needed turning from your back to your side while in a flat bed without using bedrails?: A Lot Help needed moving from lying on your back to sitting on the side of a flat bed without using bedrails?: A Lot Help needed moving to and from a bed to a chair (including a wheelchair)?: A Little Help needed standing up from a chair using your arms (e.g., wheelchair or bedside chair)?: A Little Help needed to walk in hospital room?: A Little Help needed climbing 3-5 steps with a railing? : A Lot 6 Click Score: 15    End of Session Equipment Utilized During Treatment: Gait belt Activity Tolerance: Patient tolerated treatment well Patient left: in chair;with call bell/phone within reach;with chair alarm set;with family/visitor present Nurse  Communication: Mobility status PT Visit Diagnosis: Unsteadiness on feet (R26.81);Difficulty in walking, not elsewhere classified (R26.2);Pain Pain - Right/Left: Right Pain - part of body: Knee    Time: 0626-9485 PT Time Calculation (min) (ACUTE ONLY): 38 min   Charges:   PT Evaluation $PT Eval Low Complexity: 1 Low PT Treatments $Gait Training: 8-22 mins $Therapeutic Exercise: 8-22 mins        Tresa Endo PT Acute Rehabilitation Services Pager [phone] Office [phone]   Claretha Cooper 11/21/2020, 4:51 PM

## 2020-11-21 NOTE — Anesthesia Procedure Notes (Addendum)
Spinal  Patient location during procedure: OR Start time: 11/21/2020 9:46 AM End time: 11/21/2020 9:47 AM Reason for block: surgical anesthesia Staffing Performed: anesthesiologist  Anesthesiologist: Darral Dash, DO Preanesthetic Checklist Completed: patient identified, IV checked, site marked, risks and benefits discussed, surgical consent, monitors and equipment checked, pre-op evaluation and timeout performed Spinal Block Patient position: sitting Prep: DuraPrep Patient monitoring: heart rate, cardiac monitor, continuous pulse ox and blood pressure Approach: midline Location: L3-4 Injection technique: single-shot Needle Needle type: Pencan  Needle gauge: 24 G Needle length: 10 cm Assessment Events: CSF return Additional Notes Patient identified. Risks/Benefits/Options discussed with patient including but not limited to bleeding, infection, nerve damage, paralysis, failed block, incomplete pain control, headache, blood pressure changes, nausea, vomiting, reactions to medications, itching and postpartum back pain. Confirmed with bedside nurse the patient's most recent platelet count. Confirmed with patient that they are not currently taking any anticoagulation, have any bleeding history or any family history of bleeding disorders. Patient expressed understanding and wished to proceed. All questions were answered. Sterile technique was used throughout the entire procedure. Please see nursing notes for vital signs. Warning signs of high block given to the patient including shortness of breath, tingling/numbness in hands, complete motor block, or any concerning symptoms with instructions to call for help. Patient was given instructions on fall risk and not to get out of bed. All questions and concerns addressed with instructions to call with any issues or inadequate analgesia.

## 2020-11-21 NOTE — Transfer of Care (Signed)
Immediate Anesthesia Transfer of Care Note  Patient: [name]  Procedure(s) Performed: RIGHT TOTAL KNEE ARTHROPLASTY (Right: Knee)  Patient Location: PACU  Anesthesia Type:Spinal  Level of Consciousness: awake, alert , oriented and patient cooperative  Airway & Oxygen Therapy: Patient Spontanous Breathing and Patient connected to face mask oxygen  Post-op Assessment: Report given to RN and Post -op Vital signs reviewed and stable  Post vital signs: Reviewed and stable  Last Vitals:  Vitals Value Taken Time  BP 86/65 11/21/20 1145  Temp    Pulse 131 11/21/20 1146  Resp 15 11/21/20 1146  SpO2 100 % 11/21/20 1146  Vitals shown include unvalidated device data.  Last Pain:  Vitals:   11/21/20 0802  TempSrc:   PainSc: 0-No pain         Complications: No notable events documented.

## 2020-11-21 NOTE — Op Note (Signed)
PREOP DIAGNOSIS: DJD RIGHT KNEE POSTOP DIAGNOSIS: same PROCEDURE: RIGHT TKR ANESTHESIA: Spinal and MAC ATTENDING SURGEON: [name] ASSISTANT: Loni Dolly PA  INDICATIONS FOR PROCEDURE: [name] is a 80 y.o. male who has struggled for a long time with pain due to degenerative arthritis of the right knee.  The patient has failed many conservative non-operative measures and at this point has pain which limits the ability to sleep and walk.  The patient is offered total knee replacement.  Informed operative consent was obtained after discussion of possible risks of anesthesia, infection, neurovascular injury, DVT, and death.  The importance of the post-operative rehabilitation protocol to optimize result was stressed extensively with the patient.  SUMMARY OF FINDINGS AND PROCEDURE:  [name] was taken to the operative suite where under the above anesthesia a right knee replacement was performed.  There were advanced degenerative changes and the bone quality was excellent.  We used the DePuy Attune system and placed size 7 femur, 8 tibia, 41 mm all polyethylene patella, and a size 8 mm spacer.  Loni Dolly PA-C assisted throughout and was invaluable to the completion of the case in that he helped retract and maintain exposure while I placed components.  He also helped close thereby minimizing OR time.  The patient was admitted for appropriate post-op care to include perioperative antibiotics and mechanical and pharmacologic measures for DVT prophylaxis.  DESCRIPTION OF PROCEDURE:  [name] was taken to the operative suite where the above anesthesia was applied.  The patient was positioned supine and prepped and draped in normal sterile fashion.  An appropriate time out was performed.  After the administration of kefzol and vancomycin pre-op antibiotic the leg was elevated and exsanguinated and a tourniquet inflated. A standard longitudinal incision was made on the anterior knee.   Dissection was carried down to the extensor mechanism.  All appropriate anti-infective measures were used including the pre-operative antibiotic, betadine impregnated drape, and closed hooded exhaust systems for each member of the surgical team.  A medial parapatellar incision was made in the extensor mechanism and the knee cap flipped and the knee flexed.  Some residual meniscal tissues were removed along with any remaining ACL/PCL tissue.  A guide was placed on the tibia and a flat cut was made on it's superior surface.  An intramedullary guide was placed in the femur and was utilized to make anterior and posterior cuts creating an appropriate flexion gap.  A second intramedullary guide was placed in the femur to make a distal cut properly balancing the knee with an extension gap equal to the flexion gap.  The three bones sized to the above mentioned sizes and the appropriate guides were placed and utilized.  A trial reduction was done and the knee easily came to full extension and the patella tracked well on flexion.  The trial components were removed and all bones were cleaned with pulsatile lavage and then dried thoroughly.  Cement was mixed and was pressurized onto the bones followed by placement of the aforementioned components.  Excess cement was trimmed and pressure was held on the components until the cement had hardened.  The tourniquet was deflated and a small amount of bleeding was controlled with cautery and pressure.  The knee was irrigated thoroughly.  The extensor mechanism was re-approximated with #1 ethibond in interrupted fashion.  The knee was flexed and the repair was solid.  The subcutaneous tissues were re-approximated with #0 and #2-0 vicryl and the skin  closed with a subcuticular stitch and steristrips.  A sterile dressing was applied.  Intraoperative fluids, EBL, and tourniquet time can be obtained from anesthesia records.  DISPOSITION:  The patient was taken to recovery room in stable  condition and admitted for appropriate post-op care to include peri-operative antibiotic and DVT prophylaxis with mechanical and pharmacologic measures.  [name] 11/21/2020, 11:15 AM

## 2020-11-21 NOTE — Anesthesia Procedure Notes (Signed)
Procedure Name: MAC Date/Time: 11/21/2020 9:38 AM Performed by: Lissa Morales, CRNA Pre-anesthesia Checklist: Patient identified, Emergency Drugs available, Suction available, Patient being monitored and Timeout performed Patient Re-evaluated:Patient Re-evaluated prior to induction Oxygen Delivery Method: Simple face mask Placement Confirmation: positive ETCO2

## 2020-11-21 NOTE — Anesthesia Procedure Notes (Signed)
Anesthesia Regional Block: Adductor canal block   Pre-Anesthetic Checklist: , timeout performed,  Correct Patient, Correct Site, Correct Laterality,  Correct Procedure, Correct Position, site marked,  Risks and benefits discussed,  Surgical consent,  Pre-op evaluation,  At surgeon's request and post-op pain management  Laterality: Right  Prep: Dura Prep       Needles:  Injection technique: Single-shot  Needle Type: Echogenic Stimulator Needle     Needle Length: 10cm  Needle Gauge: 20     Additional Needles:   Procedures:,,,, ultrasound used (permanent image in chart),,    Narrative:  Start time: 11/21/2020 9:02 AM End time: 11/21/2020 9:04 AM Injection made incrementally with aspirations every 5 mL.  Performed by: Personally  Anesthesiologist: Darral Dash, DO  Additional Notes: Patient identified. Risks/Benefits/Options discussed with patient including but not limited to bleeding, infection, nerve damage, failed block, incomplete pain control. Patient expressed understanding and wished to proceed. All questions were answered. Sterile technique was used throughout the entire procedure. Please see nursing notes for vital signs. Aspirated in 5cc intervals with injection for negative confirmation. Patient was given instructions on fall risk and not to get out of bed. All questions and concerns addressed with instructions to call with any issues or inadequate analgesia.

## 2020-11-21 NOTE — Progress Notes (Signed)
AssistedDr. Greg Stoltzfus with right, ultrasound guided, adductor canal block. Side rails up, monitors on throughout procedure. See vital signs in flow sheet. Tolerated Procedure well.  

## 2020-11-22 ENCOUNTER — Encounter (HOSPITAL_COMMUNITY): Payer: Self-pay | Admitting: Orthopaedic Surgery

## 2020-11-22 MED ORDER — HYDROCODONE-ACETAMINOPHEN 5-325 MG PO TABS: 1.0000 | ORAL_TABLET | Freq: Four times a day (QID) | ORAL | 0 refills | Status: DC | PRN

## 2020-11-22 MED ORDER — METHOCARBAMOL 750 MG PO TABS: 750.0000 mg | ORAL_TABLET | Freq: Four times a day (QID) | ORAL | 1 refills | Status: DC | PRN

## 2020-11-22 NOTE — Progress Notes (Signed)
Reviewed d/c instructions w/ patient. Verbalized understanding of instructions. Awaiting ride. Will d/c home w/ all belongings, instructions. To pov via w/c.

## 2020-11-22 NOTE — Anesthesia Postprocedure Evaluation (Signed)
Anesthesia Post Note  Patient: [name]  Procedure(s) Performed: RIGHT TOTAL KNEE ARTHROPLASTY (Right: Knee)     Patient location during evaluation: PACU Anesthesia Type: MAC, Spinal and Regional Level of consciousness: awake and alert Pain management: pain level controlled Vital Signs Assessment: post-procedure vital signs reviewed and stable Respiratory status: spontaneous breathing, nonlabored ventilation, respiratory function stable and patient connected to nasal cannula oxygen Cardiovascular status: blood pressure returned to baseline and stable Postop Assessment: no apparent nausea or vomiting Anesthetic complications: no   No notable events documented.  Last Vitals:  Vitals:   11/22/20 0009 11/22/20 0450  BP: 101/76 105/69  Pulse: 68 71  Resp: 16 16  Temp: 36.6 C 36.5 C  SpO2: 95% 98%    Last Pain:  Vitals:   11/22/20 0523  TempSrc:   PainSc: 2                  [name] [name]

## 2020-11-22 NOTE — TOC Transition Note (Signed)
Transition of Care [facility]) - CM/SW Discharge Note  Patient Details  Name: KURTIS ANASTASIA MRN: [id number] Date of Birth: [birth year]  [facility] CM/SW Contact:  Sherie Don, LCSW Phone Number: 11/22/2020, 10:19 AM  Clinical Narrative: Patient is expected to discharge after working with PT. Patient will discharge home with HHPT through [place] and transition to [place] [facility]. Patient has a rolling walker and 3N1 at home, so there are no DME needs. TOC signing off.  Final next level of care: [place] Barriers to Discharge: No Barriers Identified  Patient Goals and CMS Choice Patient states their goals for this hospitalization and ongoing recovery are:: Discharge home with [place] CMS Medicare.gov Compare Post Acute Care list provided to:: Patient Choice offered to / list presented to : Patient  Discharge Plan and Services         DME Arranged: N/A DME Agency: NA HH Arranged: PT HH Agency: [facility] Representative spoke with at [place]: Pre-arranged in orthopedist's office  Readmission Risk Interventions No flowsheet data found.

## 2020-11-22 NOTE — Progress Notes (Signed)
Physical Therapy Treatment Patient Details Name: [name] MRN: [id number] DOB: [birth year] Today's Date: 11/22/2020    History of Present Illness 80 yo male, S/P Right TKA.PMH:  L THA, back surgery, afib/flutter, depression.    PT Comments    Patient performed steps safely with rail and cane. Ready for Dc.   Follow Up Recommendations  Follow surgeon's recommendation for DC plan and follow-up therapies;Home health PT     Equipment Recommendations  None recommended by PT    Recommendations for Other Services       Precautions / Restrictions Precautions Precautions: Fall;Knee    Mobility  Bed Mobility Overal bed mobility: Needs Assistance Bed Mobility: Supine to Sit;Sit to Supine     Supine to sit: Supervision Sit to supine: Supervision   General bed mobility comments: in recliner    Transfers   Equipment used: Rolling walker (2 wheeled) Transfers: Sit to/from Stand Sit to Stand: Supervision         General transfer comment: practiced from high bed  Ambulation/Gait Ambulation/Gait assistance: Supervision Gait Distance (Feet): 100 Feet Assistive device: Rolling walker (2 wheeled) Gait Pattern/deviations: Step-through pattern Gait velocity: decr   General Gait Details: gait is smoothe and steady   Stairs Stairs: Yes Stairs assistance: Min guard Stair Management: One rail Left;Forwards;With cane Number of Stairs: 2     Wheelchair Mobility    Modified Rankin (Stroke Patients Only)       Balance           Standing balance support: During functional activity;Bilateral upper extremity supported Standing balance-Leahy Scale: Fair                              Cognition                                              Exercises     General Comments        Pertinent Vitals/Pain Pain Score: 3  Pain Location: knee right Pain Descriptors / Indicators: Discomfort;Aching Pain Intervention(s): Premedicated  before session;Patient requesting pain meds-RN notified;Ice applied    Home Living                      Prior Function            PT Goals (current goals can now be found in the care plan section) Progress towards PT goals: Progressing toward goals    Frequency    7X/week      PT Plan Current plan remains appropriate    Co-evaluation              AM-PAC PT "6 Clicks" Mobility   Outcome Measure  Help needed turning from your back to your side while in a flat bed without using bedrails?: None Help needed moving from lying on your back to sitting on the side of a flat bed without using bedrails?: None Help needed moving to and from a bed to a chair (including a wheelchair)?: A Little Help needed standing up from a chair using your arms (e.g., wheelchair or bedside chair)?: A Little Help needed to walk in hospital room?: A Little Help needed climbing 3-5 steps with a railing? : A Little 6 Click Score: 20    End of Session Equipment Utilized During Treatment: Gait belt  Activity Tolerance: Patient tolerated treatment well Patient left: in chair;with call bell/phone within reach;with family/visitor present Nurse Communication: Mobility status PT Visit Diagnosis: Unsteadiness on feet (R26.81);Difficulty in walking, not elsewhere classified (R26.2);Pain Pain - Right/Left: Right Pain - part of body: Knee     Time: 0518-3358 PT Time Calculation (min) (ACUTE ONLY): 18 min  Charges:  $Gait Training: 8-22 mins $                     Tresa Endo PT Acute Rehabilitation Services Pager [phone] Office [phone]   Claretha Cooper 11/22/2020, 2:21 PM

## 2020-11-22 NOTE — Discharge Summary (Signed)
Patient ID: [name] MRN: [id number] DOB/AGE: [birth year] 80 y.o.  Admit date: 11/21/2020 Discharge date: 11/22/2020  Admission Diagnoses:  Principal Problem:   Primary localized osteoarthritis of right knee Active Problems:   Primary osteoarthritis of right knee   Discharge Diagnoses:  Same  Past Medical History:  Diagnosis Date   Anginal pain ([place])    NONE IN 3 YEARS   Arthritis    Atrial fibrillation ([place]) 1962   a. No recurrence since 1962.   Chest pain    a. 1998 Cath: nl cors;  b. 2009 Cath: nl cors.   Depression    Dysrhythmia    NO TROUBLE IN 1 YR    DR. PETER [place]    Esophageal reflux    Essential hypertension    History of Paroxysmal atrial flutter (HCC)    Hyperlipidemia    Incontinence of urine    Leg pain, bilateral    Nocturia    PONV (postoperative nausea and vomiting)    CONSTIPATED   Scarlet fever 1945   Spinal stenosis    Staph skin infection    LEFT GROIN  04/11/16  TX W/ DOXYCYCLINE   Ulcerative proctitis ([place])     Surgeries: Procedure(s): RIGHT TOTAL KNEE ARTHROPLASTY on 11/21/2020   Consultants:   Discharged Condition: Improved  Hospital Course: [name] is an 80 y.o. male who was admitted 11/21/2020 for operative treatment ofPrimary localized osteoarthritis of right knee. Patient has severe unremitting pain that affects sleep, daily activities, and work/hobbies. After pre-op clearance the patient was taken to the operating room on 11/21/2020 and underwent  Procedure(s): RIGHT TOTAL KNEE ARTHROPLASTY.    Patient was given perioperative antibiotics:  Anti-infectives (From admission, onward)    Start     Dose/Rate Route Frequency Ordered Stop   11/21/20 1600  ceFAZolin (ANCEF) IVPB 2g/100 mL premix        2 g 200 mL/hr over 30 Minutes Intravenous Every 6 hours 11/21/20 1354 11/21/20 2206   11/21/20 1015  ceFAZolin (ANCEF) IVPB 2g/100 mL premix        2 g 200 mL/hr over 30 Minutes Intravenous  Once 11/21/20 1013 11/21/20 1014    11/21/20 1009  ceFAZolin (ANCEF) 2-4 GM/100ML-% IVPB       Note to Pharmacy: Bridget Hartshorn   : cabinet override      11/21/20 1009 11/21/20 2214   11/21/20 0600  vancomycin (VANCOREADY) IVPB 1500 mg/300 mL        1,500 mg 150 mL/hr over 120 Minutes Intravenous On call to O.R. 11/20/20 0715 11/21/20 1414        Patient was given sequential compression devices, early ambulation, and chemoprophylaxis to prevent DVT.  Patient benefited maximally from hospital stay and there were no complications.    Recent vital signs: Patient Vitals for the past 24 hrs:  BP Temp Temp src Pulse Resp SpO2  11/22/20 0450 105/69 97.7 F (36.5 C) Oral 71 16 98 %  11/22/20 0009 101/76 97.8 F (36.6 C) Oral 68 16 95 %  11/21/20 2110 109/69 97.9 F (36.6 C) Oral 79 16 99 %  11/21/20 1839 102/76 98.5 F (36.9 C) Oral 64 18 96 %  11/21/20 1400 (!) 100/52 97.6 F (36.4 C) Oral 65 16 100 %  11/21/20 1330 107/67 97.6 F (36.4 C) -- 64 17 94 %  11/21/20 1315 104/71 -- -- 64 17 95 %  11/21/20 1300 100/62 -- -- 60 18 97 %  11/21/20 1245 99/75 -- --  63 17 97 %  11/21/20 1230 100/67 -- -- 64 12 96 %  11/21/20 1215 95/69 -- -- 66 15 93 %  11/21/20 1200 (!) 100/48 -- -- (!) 128 14 96 %  11/21/20 1145 (!) 86/65 -- -- 74 18 97 %  11/21/20 1141 (!) 87/58 97.7 F (36.5 C) -- 79 11 97 %  11/21/20 0908 115/82 -- -- 77 11 96 %  11/21/20 0905 -- -- -- 81 13 96 %  11/21/20 0904 -- -- -- -- 17 95 %  11/21/20 0903 113/74 -- -- (!) 59 17 92 %  11/21/20 0902 -- -- -- 77 16 97 %  11/21/20 0901 -- -- -- 90 18 99 %  11/21/20 0900 -- -- -- 80 15 99 %  11/21/20 0859 108/80 -- -- 76 13 98 %     Recent laboratory studies: No results for input(s): WBC, HGB, HCT, PLT, NA, K, CL, CO2, BUN, CREATININE, GLUCOSE, INR, CALCIUM in the last 72 hours.  Invalid input(s): PT, 2   Discharge Medications:   Allergies as of 11/22/2020       Reactions   Compazine [prochlorperazine Edisylate] Other (See Comments)   EXTRAPYRAMIDAL  MOVEMENT [Involuntary muscle movement]   Penicillins Nausea And Vomiting, Rash, Other (See Comments)   Has patient had a PCN reaction causing immediate rash, facial/tongue/throat swelling, SOB or lightheadedness with hypotension: #  #  #  YES  #  #  #  Has patient had a PCN reaction causing severe rash involving mucus membranes or skin necrosis: No Has patient had a PCN reaction that required hospitalization No Has patient had a PCN reaction occurring within the last 10 years: No If all of the above answers are "NO", then may proceed with Cephalosporin use.   Welchol [colesevelam Hcl] Other (See Comments)   MYALGIAS [Leg aches]   Gemfibrozil Nausea Only   PATIENT TOLERATES   Red Yeast Rice [cholestin] Other (See Comments)   flushing        Medication List     STOP taking these medications    ibuprofen 200 MG tablet Commonly known as: ADVIL       TAKE these medications    ARTIFICIAL TEAR OP Place 1 drop into both eyes daily as needed (dry eyes).   carvedilol 3.125 MG tablet Commonly known as: COREG Take 3.125 mg by mouth at bedtime.   DULoxetine 60 MG capsule Commonly known as: CYMBALTA Take 1 capsule (60 mg total) by mouth daily.   ferrous sulfate 324 (65 Fe) MG Tbec Take 324 mg by mouth daily.   HYDROcodone-acetaminophen 5-325 MG tablet Commonly known as: NORCO/VICODIN Take 1-2 tablets by mouth every 6 (six) hours as needed for moderate pain or severe pain (post op pain).   lisinopril 5 MG tablet Commonly known as: ZESTRIL Take 5 mg by mouth at bedtime.   methocarbamol 750 MG tablet Commonly known as: ROBAXIN Take 1 tablet (750 mg total) by mouth every 6 (six) hours as needed for muscle spasms. What changed:  when to take this reasons to take this   MULTIVITAMIN PO Take 1 tablet by mouth daily.   nitroGLYCERIN 0.4 MG SL tablet Commonly known as: NITROSTAT Place 0.4 mg under the tongue every 5 (five) minutes as needed for chest pain.   omeprazole 20  MG capsule Commonly known as: PRILOSEC Take 20 mg by mouth daily.   polyethylene glycol 17 g packet Commonly known as: MIRALAX / GLYCOLAX Take 17 g by mouth daily  as needed for mild constipation.   rOPINIRole 0.5 MG tablet Commonly known as: REQUIP Take 1 tablet (0.5 mg total) by mouth at bedtime.   tamsulosin 0.4 MG Caps capsule Commonly known as: FLOMAX Take 0.4 mg by mouth daily.       ASK your doctor about these medications    clonazePAM 0.5 MG tablet Commonly known as: KLONOPIN take 1/2-1 TABLET BY MOUTH AT BEDTIME AS NEEDED   Eliquis 5 MG Tabs tablet Generic drug: apixaban TAKE ONE TABLET BY MOUTH EVERY MORNING and TAKE ONE TABLET BY MOUTH EVERY EVENING   pregabalin 100 MG capsule Commonly known as: LYRICA TAKE THREE CAPSULES BY MOUTH EVERY EVENING   rosuvastatin 10 MG tablet Commonly known as: CRESTOR TAKE 1 TABLET ONCE DAILY OR AS DIRECTED.               Durable Medical Equipment  (From admission, onward)           Start     Ordered   11/21/20 1355  DME Walker rolling  Once       Question:  Patient needs a walker to treat with the following condition  Answer:  Primary osteoarthritis of right knee   11/21/20 1354   11/21/20 1355  DME 3 n 1  Once        11/21/20 1354   11/21/20 1355  DME Bedside commode  Once       Question:  Patient needs a bedside commode to treat with the following condition  Answer:  Primary osteoarthritis of right knee   11/21/20 1354            Diagnostic Studies: DG Chest 2 View  Result Date: 11/14/2020 CLINICAL DATA:  80 year old male under preoperative evaluation prior to right total knee arthroplasty. EXAM: CHEST - 2 VIEW COMPARISON:  Chest x-ray 04/14/2017. FINDINGS: Lung volumes are normal. No consolidative airspace disease. No pleural effusions. No pneumothorax. No definite suspicious appearing pulmonary nodules or masses are noted. No evidence of pulmonary edema. Large hiatal hernia. Heart size is normal. Upper  mediastinal contours are within normal limits. IMPRESSION: 1. No radiographic evidence of acute cardiopulmonary disease. 2. Large hiatal hernia. Electronically Signed   By: Vinnie Langton M.D.   On: 11/14/2020 21:48    Disposition: Discharge disposition: 01-Home or Self Care       Discharge Instructions     Call MD / Call 911   Complete by: As directed    If you experience chest pain or shortness of breath, CALL 911 and be transported to the hospital emergency room.  If you develope a fever above 101 F, pus (white drainage) or increased drainage or redness at the wound, or calf pain, call your surgeon's office.   Constipation Prevention   Complete by: As directed    Drink plenty of fluids.  Prune juice may be helpful.  You may use a stool softener, such as Colace (over the counter) 100 mg twice a day.  Use MiraLax (over the counter) for constipation as needed.   Diet - low sodium heart healthy   Complete by: As directed    Discharge instructions   Complete by: As directed    INSTRUCTIONS AFTER JOINT REPLACEMENT   Remove items at home which could result in a fall. This includes throw rugs or furniture in walking pathways ICE to the affected joint every three hours while awake for 30 minutes at a time, for at least the first 3-5 days, and then as  needed for pain and swelling.  Continue to use ice for pain and swelling. You may notice swelling that will progress down to the foot and ankle.  This is normal after surgery.  Elevate your leg when you are not up walking on it.   Continue to use the breathing machine you got in the hospital (incentive spirometer) which will help keep your temperature down.  It is common for your temperature to cycle up and down following surgery, especially at night when you are not up moving around and exerting yourself.  The breathing machine keeps your lungs expanded and your temperature down.   DIET:  As you were doing prior to hospitalization, we recommend  a well-balanced diet.  DRESSING / WOUND CARE / SHOWERING  You may shower 3 days after surgery, but keep the wounds dry during showering.  You may use an occlusive plastic wrap (Press'n Seal for example), NO SOAKING/SUBMERGING IN THE BATHTUB.  If the bandage gets wet, change with a clean dry gauze.  If the incision gets wet, pat the wound dry with a clean towel.  ACTIVITY  Increase activity slowly as tolerated, but follow the weight bearing instructions below.   No driving for 6 weeks or until further direction given by your physician.  You cannot drive while taking narcotics.  No lifting or carrying greater than 10 lbs. until further directed by your surgeon. Avoid periods of inactivity such as sitting longer than an hour when not asleep. This helps prevent blood clots.  You may return to work once you are authorized by your doctor.     WEIGHT BEARING   Weight bearing as tolerated with assist device (walker, cane, etc) as directed, use it as long as suggested by your surgeon or therapist, typically at least 4-6 weeks.   EXERCISES  Results after joint replacement surgery are often greatly improved when you follow the exercise, range of motion and muscle strengthening exercises prescribed by your doctor. Safety measures are also important to protect the joint from further injury. Any time any of these exercises cause you to have increased pain or swelling, decrease what you are doing until you are comfortable again and then slowly increase them. If you have problems or questions, call your caregiver or physical therapist for advice.   Rehabilitation is important following a joint replacement. After just a few days of immobilization, the muscles of the leg can become weakened and shrink (atrophy).  These exercises are designed to build up the tone and strength of the thigh and leg muscles and to improve motion. Often times heat used for twenty to thirty minutes before working out will loosen up  your tissues and help with improving the range of motion but do not use heat for the first two weeks following surgery (sometimes heat can increase post-operative swelling).   These exercises can be done on a training (exercise) mat, on the floor, on a table or on a bed. Use whatever works the best and is most comfortable for you.    Use music or television while you are exercising so that the exercises are a pleasant break in your day. This will make your life better with the exercises acting as a break in your routine that you can look forward to.   Perform all exercises about fifteen times, three times per day or as directed.  You should exercise both the operative leg and the other leg as well.  Exercises include:   Lobbyist - Tighten  up the muscle on the front of the thigh (Quad) and hold for 5-10 seconds.   Straight Leg Raises - With your knee straight (if you were given a brace, keep it on), lift the leg to 60 degrees, hold for 3 seconds, and slowly lower the leg.  Perform this exercise against resistance later as your leg gets stronger.  Leg Slides: Lying on your back, slowly slide your foot toward your buttocks, bending your knee up off the floor (only go as far as is comfortable). Then slowly slide your foot back down until your leg is flat on the floor again.  Angel Wings: Lying on your back spread your legs to the side as far apart as you can without causing discomfort.  Hamstring Strength:  Lying on your back, push your heel against the floor with your leg straight by tightening up the muscles of your buttocks.  Repeat, but this time bend your knee to a comfortable angle, and push your heel against the floor.  You may put a pillow under the heel to make it more comfortable if necessary.   A rehabilitation program following joint replacement surgery can speed recovery and prevent re-injury in the future due to weakened muscles. Contact your doctor or a physical therapist for more information  on knee rehabilitation.    CONSTIPATION  Constipation is defined medically as fewer than three stools per week and severe constipation as less than one stool per week.  Even if you have a regular bowel pattern at home, your normal regimen is likely to be disrupted due to multiple reasons following surgery.  Combination of anesthesia, postoperative narcotics, change in appetite and fluid intake all can affect your bowels.   YOU MUST use at least one of the following options; they are listed in order of increasing strength to get the job done.  They are all available over the counter, and you may need to use some, POSSIBLY even all of these options:    Drink plenty of fluids (prune juice may be helpful) and high fiber foods Colace 100 mg by mouth twice a day  Senokot for constipation as directed and as needed Dulcolax (bisacodyl), take with full glass of water  Miralax (polyethylene glycol) once or twice a day as needed.  If you have tried all these things and are unable to have a bowel movement in the first 3-4 days after surgery call either your surgeon or your primary doctor.    If you experience loose stools or diarrhea, hold the medications until you stool forms back up.  If your symptoms do not get better within 1 week or if they get worse, check with your doctor.  If you experience "the worst abdominal pain ever" or develop nausea or vomiting, please contact the office immediately for further recommendations for treatment.   ITCHING:  If you experience itching with your medications, try taking only a single pain pill, or even half a pain pill at a time.  You can also use Benadryl over the counter for itching or also to help with sleep.   TED HOSE STOCKINGS:  Use stockings on both legs until for at least 2 weeks or as directed by physician office. They may be removed at night for sleeping.  MEDICATIONS:  See your medication summary on the "After Visit Summary" that nursing will review with  you.  You may have some home medications which will be placed on hold until you complete the course of blood thinner medication.  It is important for you to complete the blood thinner medication as prescribed.  PRECAUTIONS:  If you experience chest pain or shortness of breath - call 911 immediately for transfer to the hospital emergency department.   If you develop a fever greater that 101 F, purulent drainage from wound, increased redness or drainage from wound, foul odor from the wound/dressing, or calf pain - CONTACT YOUR SURGEON.                                                   FOLLOW-UP APPOINTMENTS:  If you do not already have a post-op appointment, please call the office for an appointment to be seen by your surgeon.  Guidelines for how soon to be seen are listed in your "After Visit Summary", but are typically between 1-4 weeks after surgery.  OTHER INSTRUCTIONS:   Knee Replacement:  Do not place pillow under knee, focus on keeping the knee straight while resting. CPM instructions: 0-90 degrees, 2 hours in the morning, 2 hours in the afternoon, and 2 hours in the evening. Place foam block, curve side up under heel at all times except when in CPM or when walking.  DO NOT modify, tear, cut, or change the foam block in any way.  POST-OPERATIVE OPIOID TAPER INSTRUCTIONS: It is important to wean off of your opioid medication as soon as possible. If you do not need pain medication after your surgery it is ok to stop day one. Opioids include: Codeine, Hydrocodone(Norco, Vicodin), Oxycodone(Percocet, oxycontin) and hydromorphone amongst others.  Long term and even short term use of opiods can cause: Increased pain response Dependence Constipation Depression Respiratory depression And more.  Withdrawal symptoms can include Flu like symptoms Nausea, vomiting And more Techniques to manage these symptoms Hydrate well Eat regular healthy meals Stay active Use relaxation techniques(deep  breathing, meditating, yoga) Do Not substitute Alcohol to help with tapering If you have been on opioids for less than two weeks and do not have pain than it is ok to stop all together.  Plan to wean off of opioids This plan should start within one week post op of your joint replacement. Maintain the same interval or time between taking each dose and first decrease the dose.  Cut the total daily intake of opioids by one tablet each day Next start to increase the time between doses. The last dose that should be eliminated is the evening dose.     MAKE SURE YOU:  Understand these instructions.  Get help right away if you are not doing well or get worse.    Thank you for letting us be a part of your medical care team.  It is a privilege we respect greatly.  We hope these instructions will help you stay on track for a fast and full recovery!   Increase activity slowly as tolerated   Complete by: As directed    Post-operative opioid taper instructions:   Complete by: As directed    POST-OPERATIVE OPIOID TAPER INSTRUCTIONS: It is important to wean off of your opioid medication as soon as possible. If you do not need pain medication after your surgery it is ok to stop day one. Opioids include: Codeine, Hydrocodone(Norco, Vicodin), Oxycodone(Percocet, oxycontin) and hydromorphone amongst others.  Long term and even short term use of opiods can cause: Increased pain response Dependence Constipation Depression  Respiratory depression And more.  Withdrawal symptoms can include Flu like symptoms Nausea, vomiting And more Techniques to manage these symptoms Hydrate well Eat regular healthy meals Stay active Use relaxation techniques(deep breathing, meditating, yoga) Do Not substitute Alcohol to help with tapering If you have been on opioids for less than two weeks and do not have pain than it is ok to stop all together.  Plan to wean off of opioids This plan should start within one  week post op of your joint replacement. Maintain the same interval or time between taking each dose and first decrease the dose.  Cut the total daily intake of opioids by one tablet each day Next start to increase the time between doses. The last dose that should be eliminated is the evening dose.           Follow-up Information     Melrose Nakayama, MD. Go on 12/01/2020.   Specialty: Orthopedic Surgery Why: Your appointment is scheduled for 9:15. Contact information: [place] [address] [phone]         [facility] Follow up.   Specialty: Home Health Services Why: HHPT will provide 5 visits prior to starting outpatient physical therapy. Contact information: [address] [place] [address] [phone]         [facility], [place]. Go on 12/01/2020.   Why: You appointment is scheduled for 10:00. Contact information: Physical Therapy [address] [place] [address] [phone]                  Signed: Larwance Sachs Magdelyn Roebuck 11/22/2020, 7:51 AM

## 2020-11-22 NOTE — Progress Notes (Signed)
Physical Therapy Treatment Patient Details Name: [name] MRN: [id number] DOB: 12/19/1940 Today's Date: 11/22/2020    History of Present Illness 80 yo male, S/P Right TKA.PMH:  L THA, back surgery, afib/flutter, depression.    PT Comments    The  patient is progressing well. Ambulated 100'. Pain is 2/10. Will practice steps next visit.   Follow Up Recommendations  Follow surgeon's recommendation for DC plan and follow-up therapies;Home health PT     Equipment Recommendations  None recommended by PT    Recommendations for Other Services       Precautions / Restrictions Precautions Precautions: Fall;Knee    Mobility  Bed Mobility Overal bed mobility: Needs Assistance Bed Mobility: Supine to Sit;Sit to Supine     Supine to sit: Supervision Sit to supine: Supervision        Transfers   Equipment used: Rolling walker (2 wheeled) Transfers: Sit to/from Stand Sit to Stand: Supervision         General transfer comment: practiced from high bed  Ambulation/Gait Ambulation/Gait assistance: Min guard Gait Distance (Feet): 100 Feet Assistive device: Rolling walker (2 wheeled) Gait Pattern/deviations: Step-to pattern;Step-through pattern Gait velocity: decr   General Gait Details: gait is smoothe and steady   Chief Strategy Officer    Modified Rankin (Stroke Patients Only)       Balance           Standing balance support: During functional activity;Bilateral upper extremity supported Standing balance-Leahy Scale: Fair                              Biomedical engineer Sets: AROM;Both;10 reps Heel Slides: AROM;Right;10 reps Hip ABduction/ADduction: AROM;Right;10 reps Straight Leg Raises: AROM;Right;10 reps Long Arc Quad: AROM;Right;10 reps Knee Flexion: AROM;Right;10 reps Goniometric ROM: 10-80 right knee flexion     General Comments        Pertinent Vitals/Pain Pain Score: 2  Pain Location: knee right Pain Descriptors / Indicators: Discomfort;Aching Pain Intervention(s): Monitored during session;Ice applied;Patient requesting pain meds-RN notified    Home Living                      Prior Function            PT Goals (current goals can now be found in the care plan section) Progress towards PT goals: Progressing toward goals    Frequency    7X/week      PT Plan Current plan remains appropriate    Co-evaluation              AM-PAC PT "6 Clicks" Mobility   Outcome Measure  Help needed turning from your back to your side while in a flat bed without using bedrails?: None Help needed moving from lying on your back to sitting on the side of a flat bed without using bedrails?: None Help needed moving to and from a bed to a chair (including a wheelchair)?: A Little Help needed standing up from a chair using your arms (e.g., wheelchair or bedside chair)?: A Little Help needed to walk in hospital room?:  A Little Help needed climbing 3-5 steps with a railing? : A Little 6 Click Score: 20    End of Session Equipment Utilized During Treatment: Gait belt Activity Tolerance: Patient tolerated treatment well Patient left: in chair;with call bell/phone within reach;with family/visitor present Nurse Communication: Mobility status PT Visit Diagnosis: Unsteadiness on feet (R26.81);Difficulty in walking, not elsewhere classified (R26.2);Pain Pain - Right/Left: Right Pain - part of body: Knee     Time: 7614-7092 PT Time Calculation (min) (ACUTE ONLY): 47 min  Charges:  $Gait Training: 8-22 mins $Therapeutic Exercise: 8-22 mins $Self Care/Home Management: [place] Pager [phone] Office [phone]    Claretha Cooper 11/22/2020, 2:15 PM

## 2020-11-22 NOTE — Progress Notes (Signed)
Subjective: 1 Day Post-Op Procedure(s) (LRB): RIGHT TOTAL KNEE ARTHROPLASTY (Right)  Patient is doing well. Not much pain today. He is looking forward to going home.  Activity level:  wbat Diet tolerance:  ok Voiding:  ok Patient reports pain as mild.    Objective: Vital signs in last 24 hours: Temp:  [97.6 F (36.4 C)-98.5 F (36.9 C)] 97.7 F (36.5 C) (06/22 0450) Pulse Rate:  [59-128] 71 (06/22 0450) Resp:  [11-18] 16 (06/22 0450) BP: (86-115)/(48-82) 105/69 (06/22 0450) SpO2:  [92 %-100 %] 98 % (06/22 0450)  Labs: No results for input(s): HGB in the last 72 hours. No results for input(s): WBC, RBC, HCT, PLT in the last 72 hours. No results for input(s): NA, K, CL, CO2, BUN, CREATININE, GLUCOSE, CALCIUM in the last 72 hours. No results for input(s): LABPT, INR in the last 72 hours.  Physical Exam:  Neurologically intact ABD soft Neurovascular intact Sensation intact distally Intact pulses distally Dorsiflexion/Plantar flexion intact Incision: dressing C/D/I and no drainage No cellulitis present Compartment soft  Assessment/Plan:  1 Day Post-Op Procedure(s) (LRB): RIGHT TOTAL KNEE ARTHROPLASTY (Right) Advance diet Up with therapy D/C IV fluids Discharge home with home health today after PT. Follow up in office 2 weeks post op. Resume home eliquis.   [name] 11/22/2020, 7:46 AM

## 2020-11-23 DIAGNOSIS — Z471 Aftercare following joint replacement surgery: Secondary | ICD-10-CM | POA: Diagnosis not present

## 2020-11-23 DIAGNOSIS — N183 Chronic kidney disease, stage 3 unspecified: Secondary | ICD-10-CM | POA: Diagnosis not present

## 2020-11-23 DIAGNOSIS — M48062 Spinal stenosis, lumbar region with neurogenic claudication: Secondary | ICD-10-CM | POA: Diagnosis not present

## 2020-11-23 DIAGNOSIS — G2581 Restless legs syndrome: Secondary | ICD-10-CM | POA: Diagnosis not present

## 2020-11-23 DIAGNOSIS — M47816 Spondylosis without myelopathy or radiculopathy, lumbar region: Secondary | ICD-10-CM | POA: Diagnosis not present

## 2020-11-23 DIAGNOSIS — Z961 Presence of intraocular lens: Secondary | ICD-10-CM | POA: Diagnosis not present

## 2020-11-23 DIAGNOSIS — F17290 Nicotine dependence, other tobacco product, uncomplicated: Secondary | ICD-10-CM | POA: Diagnosis not present

## 2020-11-23 DIAGNOSIS — H9193 Unspecified hearing loss, bilateral: Secondary | ICD-10-CM | POA: Diagnosis not present

## 2020-11-23 DIAGNOSIS — G4700 Insomnia, unspecified: Secondary | ICD-10-CM | POA: Diagnosis not present

## 2020-11-23 DIAGNOSIS — I4819 Other persistent atrial fibrillation: Secondary | ICD-10-CM | POA: Diagnosis not present

## 2020-11-23 DIAGNOSIS — I4892 Unspecified atrial flutter: Secondary | ICD-10-CM | POA: Diagnosis not present

## 2020-11-23 DIAGNOSIS — Z96651 Presence of right artificial knee joint: Secondary | ICD-10-CM | POA: Diagnosis not present

## 2020-11-23 DIAGNOSIS — I129 Hypertensive chronic kidney disease with stage 1 through stage 4 chronic kidney disease, or unspecified chronic kidney disease: Secondary | ICD-10-CM | POA: Diagnosis not present

## 2020-11-23 DIAGNOSIS — E782 Mixed hyperlipidemia: Secondary | ICD-10-CM | POA: Diagnosis not present

## 2020-11-23 DIAGNOSIS — Z7901 Long term (current) use of anticoagulants: Secondary | ICD-10-CM | POA: Diagnosis not present

## 2020-11-23 DIAGNOSIS — Z974 Presence of external hearing-aid: Secondary | ICD-10-CM | POA: Diagnosis not present

## 2020-11-23 DIAGNOSIS — K5120 Ulcerative (chronic) proctitis without complications: Secondary | ICD-10-CM | POA: Diagnosis not present

## 2020-11-23 DIAGNOSIS — K219 Gastro-esophageal reflux disease without esophagitis: Secondary | ICD-10-CM | POA: Diagnosis not present

## 2020-11-23 DIAGNOSIS — M1712 Unilateral primary osteoarthritis, left knee: Secondary | ICD-10-CM | POA: Diagnosis not present

## 2020-11-26 DIAGNOSIS — M1711 Unilateral primary osteoarthritis, right knee: Secondary | ICD-10-CM | POA: Diagnosis not present

## 2020-11-26 DIAGNOSIS — K219 Gastro-esophageal reflux disease without esophagitis: Secondary | ICD-10-CM | POA: Diagnosis not present

## 2020-11-26 DIAGNOSIS — I10 Essential (primary) hypertension: Secondary | ICD-10-CM | POA: Diagnosis not present

## 2020-11-26 DIAGNOSIS — M179 Osteoarthritis of knee, unspecified: Secondary | ICD-10-CM | POA: Diagnosis not present

## 2020-11-26 DIAGNOSIS — N1831 Chronic kidney disease, stage 3a: Secondary | ICD-10-CM | POA: Diagnosis not present

## 2020-11-26 DIAGNOSIS — E782 Mixed hyperlipidemia: Secondary | ICD-10-CM | POA: Diagnosis not present

## 2020-11-26 DIAGNOSIS — D508 Other iron deficiency anemias: Secondary | ICD-10-CM | POA: Diagnosis not present

## 2020-11-27 ENCOUNTER — Emergency Department (HOSPITAL_BASED_OUTPATIENT_CLINIC_OR_DEPARTMENT_OTHER): Payer: Medicare Other

## 2020-11-27 ENCOUNTER — Emergency Department (HOSPITAL_COMMUNITY): Payer: Medicare Other

## 2020-11-27 ENCOUNTER — Emergency Department (HOSPITAL_COMMUNITY)
Admission: EM | Admit: 2020-11-27 | Discharge: 2020-11-27 | Disposition: A | Discharge status: Home / Self Care | Payer: Medicare Other | Attending: Emergency Medicine | Admitting: Emergency Medicine

## 2020-11-27 ENCOUNTER — Encounter (HOSPITAL_COMMUNITY): Payer: Self-pay | Admitting: Emergency Medicine

## 2020-11-27 DIAGNOSIS — Z9889 Other specified postprocedural states: Secondary | ICD-10-CM

## 2020-11-27 DIAGNOSIS — Z96642 Presence of left artificial hip joint: Secondary | ICD-10-CM | POA: Diagnosis not present

## 2020-11-27 DIAGNOSIS — Z7901 Long term (current) use of anticoagulants: Secondary | ICD-10-CM | POA: Insufficient documentation

## 2020-11-27 DIAGNOSIS — H9193 Unspecified hearing loss, bilateral: Secondary | ICD-10-CM | POA: Diagnosis not present

## 2020-11-27 DIAGNOSIS — R0602 Shortness of breath: Secondary | ICD-10-CM | POA: Diagnosis not present

## 2020-11-27 DIAGNOSIS — Z96651 Presence of right artificial knee joint: Secondary | ICD-10-CM | POA: Diagnosis not present

## 2020-11-27 DIAGNOSIS — M7989 Other specified soft tissue disorders: Secondary | ICD-10-CM | POA: Insufficient documentation

## 2020-11-27 DIAGNOSIS — N183 Chronic kidney disease, stage 3 unspecified: Secondary | ICD-10-CM | POA: Diagnosis not present

## 2020-11-27 DIAGNOSIS — I129 Hypertensive chronic kidney disease with stage 1 through stage 4 chronic kidney disease, or unspecified chronic kidney disease: Secondary | ICD-10-CM | POA: Insufficient documentation

## 2020-11-27 DIAGNOSIS — M47816 Spondylosis without myelopathy or radiculopathy, lumbar region: Secondary | ICD-10-CM | POA: Diagnosis not present

## 2020-11-27 DIAGNOSIS — R079 Chest pain, unspecified: Secondary | ICD-10-CM | POA: Insufficient documentation

## 2020-11-27 DIAGNOSIS — I4819 Other persistent atrial fibrillation: Secondary | ICD-10-CM | POA: Diagnosis not present

## 2020-11-27 DIAGNOSIS — Z20822 Contact with and (suspected) exposure to covid-19: Secondary | ICD-10-CM | POA: Insufficient documentation

## 2020-11-27 DIAGNOSIS — G8918 Other acute postprocedural pain: Secondary | ICD-10-CM

## 2020-11-27 DIAGNOSIS — Z974 Presence of external hearing-aid: Secondary | ICD-10-CM | POA: Diagnosis not present

## 2020-11-27 DIAGNOSIS — I4891 Unspecified atrial fibrillation: Secondary | ICD-10-CM

## 2020-11-27 DIAGNOSIS — E782 Mixed hyperlipidemia: Secondary | ICD-10-CM | POA: Diagnosis not present

## 2020-11-27 DIAGNOSIS — F17290 Nicotine dependence, other tobacco product, uncomplicated: Secondary | ICD-10-CM | POA: Diagnosis not present

## 2020-11-27 DIAGNOSIS — K5120 Ulcerative (chronic) proctitis without complications: Secondary | ICD-10-CM | POA: Diagnosis not present

## 2020-11-27 DIAGNOSIS — Z961 Presence of intraocular lens: Secondary | ICD-10-CM | POA: Diagnosis not present

## 2020-11-27 DIAGNOSIS — Z79899 Other long term (current) drug therapy: Secondary | ICD-10-CM | POA: Diagnosis not present

## 2020-11-27 DIAGNOSIS — I4892 Unspecified atrial flutter: Secondary | ICD-10-CM | POA: Diagnosis not present

## 2020-11-27 DIAGNOSIS — G4700 Insomnia, unspecified: Secondary | ICD-10-CM | POA: Diagnosis not present

## 2020-11-27 DIAGNOSIS — K449 Diaphragmatic hernia without obstruction or gangrene: Secondary | ICD-10-CM | POA: Diagnosis not present

## 2020-11-27 DIAGNOSIS — G2581 Restless legs syndrome: Secondary | ICD-10-CM | POA: Diagnosis not present

## 2020-11-27 DIAGNOSIS — F17210 Nicotine dependence, cigarettes, uncomplicated: Secondary | ICD-10-CM | POA: Insufficient documentation

## 2020-11-27 DIAGNOSIS — M48062 Spinal stenosis, lumbar region with neurogenic claudication: Secondary | ICD-10-CM | POA: Diagnosis not present

## 2020-11-27 DIAGNOSIS — M1712 Unilateral primary osteoarthritis, left knee: Secondary | ICD-10-CM | POA: Diagnosis not present

## 2020-11-27 DIAGNOSIS — Z743 Need for continuous supervision: Secondary | ICD-10-CM | POA: Diagnosis not present

## 2020-11-27 DIAGNOSIS — K219 Gastro-esophageal reflux disease without esophagitis: Secondary | ICD-10-CM | POA: Diagnosis not present

## 2020-11-27 DIAGNOSIS — Z471 Aftercare following joint replacement surgery: Secondary | ICD-10-CM | POA: Diagnosis not present

## 2020-11-27 LAB — URINALYSIS, ROUTINE W REFLEX MICROSCOPIC
Bilirubin Urine: NEGATIVE
Glucose, UA: NEGATIVE mg/dL
Hgb urine dipstick: NEGATIVE
Ketones, ur: 20 mg/dL — AB
Leukocytes,Ua: NEGATIVE
Nitrite: NEGATIVE
Protein, ur: NEGATIVE mg/dL
Specific Gravity, Urine: 1.01 (ref 1.005–1.030)
pH: 7 (ref 5.0–8.0)

## 2020-11-27 LAB — CBC WITH DIFFERENTIAL/PLATELET
Abs Immature Granulocytes: 0.02 10*3/uL (ref 0.00–0.07)
Basophils Absolute: 0 10*3/uL (ref 0.0–0.1)
Basophils Relative: 1 %
Eosinophils Absolute: 0.2 10*3/uL (ref 0.0–0.5)
Eosinophils Relative: 2 %
HCT: 33.7 % — ABNORMAL LOW (ref 39.0–52.0)
Hemoglobin: 11.1 g/dL — ABNORMAL LOW (ref 13.0–17.0)
Immature Granulocytes: 0 %
Lymphocytes Relative: 22 %
Lymphs Abs: 1.7 10*3/uL (ref 0.7–4.0)
MCH: 33.4 pg (ref 26.0–34.0)
MCHC: 32.9 g/dL (ref 30.0–36.0)
MCV: 101.5 fL — ABNORMAL HIGH (ref 80.0–100.0)
Monocytes Absolute: 0.8 10*3/uL (ref 0.1–1.0)
Monocytes Relative: 11 %
Neutro Abs: 4.8 10*3/uL (ref 1.7–7.7)
Neutrophils Relative %: 64 %
Platelets: 216 10*3/uL (ref 150–400)
RBC: 3.32 MIL/uL — ABNORMAL LOW (ref 4.22–5.81)
RDW: 14 % (ref 11.5–15.5)
WBC: 7.5 10*3/uL (ref 4.0–10.5)
nRBC: 0 % (ref 0.0–0.2)

## 2020-11-27 LAB — CBC
HCT: 31.8 % — ABNORMAL LOW (ref 39.0–52.0)
Hemoglobin: 10.6 g/dL — ABNORMAL LOW (ref 13.0–17.0)
MCH: 33.7 pg (ref 26.0–34.0)
MCHC: 33.3 g/dL (ref 30.0–36.0)
MCV: 101 fL — ABNORMAL HIGH (ref 80.0–100.0)
Platelets: 197 10*3/uL (ref 150–400)
RBC: 3.15 MIL/uL — ABNORMAL LOW (ref 4.22–5.81)
RDW: 14.1 % (ref 11.5–15.5)
WBC: 7.7 10*3/uL (ref 4.0–10.5)
nRBC: 0 % (ref 0.0–0.2)

## 2020-11-27 LAB — COMPREHENSIVE METABOLIC PANEL
ALT: 26 U/L (ref 0–44)
AST: 54 U/L — ABNORMAL HIGH (ref 15–41)
Albumin: 3.3 g/dL — ABNORMAL LOW (ref 3.5–5.0)
Alkaline Phosphatase: 45 U/L (ref 38–126)
Anion gap: 9 (ref 5–15)
BUN: 17 mg/dL (ref 8–23)
CO2: 25 mmol/L (ref 22–32)
Calcium: 8.5 mg/dL — ABNORMAL LOW (ref 8.9–10.3)
Chloride: 102 mmol/L (ref 98–111)
Creatinine, Ser: 1.15 mg/dL (ref 0.61–1.24)
GFR, Estimated: >60 mL/min (ref >60)
Glucose, Bld: 102 mg/dL — ABNORMAL HIGH (ref 70–99)
Potassium: 4.3 mmol/L (ref 3.5–5.1)
Sodium: 136 mmol/L (ref 135–145)
Total Bilirubin: 1.7 mg/dL — ABNORMAL HIGH (ref 0.3–1.2)
Total Protein: 5.9 g/dL — ABNORMAL LOW (ref 6.5–8.1)

## 2020-11-27 LAB — TYPE AND SCREEN
ABO/RH(D): O POS
Antibody Screen: NEGATIVE

## 2020-11-27 LAB — RESP PANEL BY RT-PCR (FLU A&B, COVID) ARPGX2
Influenza A by PCR: NEGATIVE
Influenza B by PCR: NEGATIVE
SARS Coronavirus 2 by RT PCR: NEGATIVE

## 2020-11-27 LAB — TROPONIN I (HIGH SENSITIVITY): Troponin I (High Sensitivity): 8 ng/L (ref <18)

## 2020-11-27 MED ORDER — CARVEDILOL 3.125 MG PO TABS
3.1250 mg | ORAL_TABLET | Freq: Once | ORAL | Status: AC
  Administered 2020-11-27: 3.125 mg via ORAL
  Filled 2020-11-27: qty 1

## 2020-11-27 MED ORDER — CARVEDILOL 3.125 MG PO TABS: 3.1250 mg | ORAL_TABLET | Freq: Once | ORAL | Status: DC

## 2020-11-27 MED ORDER — IOHEXOL 350 MG/ML SOLN
100.0000 mL | Freq: Once | INTRAVENOUS | Status: AC
  Administered 2020-11-27: 100 mL via INTRAVENOUS

## 2020-11-27 MED ORDER — DILTIAZEM HCL 25 MG/5ML IV SOLN
10.0000 mg | Freq: Once | INTRAVENOUS | Status: AC
  Administered 2020-11-27: 10 mg via INTRAVENOUS
  Filled 2020-11-27: qty 5

## 2020-11-27 MED ORDER — FENTANYL CITRATE (PF) 100 MCG/2ML IJ SOLN
50.0000 ug | Freq: Once | INTRAMUSCULAR | Status: AC
Start: 2020-11-27 — End: 2020-11-27
  Administered 2020-11-27: 50 ug via INTRAVENOUS
  Filled 2020-11-27: qty 2

## 2020-11-27 NOTE — ED Provider Notes (Signed)
[facility] EMERGENCY DEPARTMENT Provider Note   CSN: [id number] Arrival date & time: 11/27/20  1336     History Chief Complaint  Patient presents with   Shortness of Breath   Chest Pain    [name] is a 80 y.o. male.  Presents to ER with concern for chest pain shortness of breath.  Patient states that since surgery he has had worsening swelling of his right leg.  Having some aches but no significant pain.  No numbness or weakness.  Has history of A. fib, anticoagulated on Eliquis.  Eliquis held prior to surgery but resumed postoperatively.  Elective knee surgery on 6/21.  Pain is currently mild to moderate.  Central.  In addition to the shortness of breath, also feels generally weak, fatigued.  No cough.  HPI     Past Medical History:  Diagnosis Date   Anginal pain ([place])    NONE IN 3 YEARS   Arthritis    Atrial fibrillation ([place]) 1962   a. No recurrence since 1962.   Chest pain    a. 1998 Cath: nl cors;  b. 2009 Cath: nl cors.   Depression    Dysrhythmia    NO TROUBLE IN 1 YR    DR. PETER Martinique    Esophageal reflux    Essential hypertension    History of Paroxysmal atrial flutter (HCC)    Hyperlipidemia    Incontinence of urine    Leg pain, bilateral    Nocturia    PONV (postoperative nausea and vomiting)    CONSTIPATED   Scarlet fever 1945   Spinal stenosis    Staph skin infection    LEFT GROIN  04/11/16  TX W/ DOXYCYCLINE   Ulcerative proctitis ([place])     Patient Active Problem List   Diagnosis Date Noted   Primary localized osteoarthritis of right knee 11/21/2020   Primary osteoarthritis of right knee 11/21/2020   Benign prostatic hyperplasia with lower urinary tract symptoms 11/06/2020   Low back pain 08/10/2019   Gait abnormality 08/10/2019   Paresthesia 08/10/2018   Persistent atrial fibrillation ([place]) 07/07/2018   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/22/2017   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   Spinal  stenosis, lumbar region, with neurogenic claudication 04/18/2016   Essential hypertension    Restless leg syndrome, uncontrolled 08/31/2014   Restless leg syndrome 04/19/2014   Leg pain, bilateral    Normal coronary arteries 02/01/2014   CKD (chronic kidney disease), stage III ([place]) 07/05/2013   Dysphagia 07/05/2013   Esophageal reflux 07/05/2013   Hearing difficulty 07/05/2013   Hypertension 07/05/2013   Impaired fasting glucose 07/05/2013   Insomnia 07/05/2013   Spinal stenosis 07/05/2013   Major depressive disorder, recurrent episode ([place]) 07/05/2013   Osteoarthrosis, unspecified whether generalized or localized, unspecified site 07/05/2013   Tachyarrhythmia 07/05/2013   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/05/2013   Mixed hyperlipidemia 05/24/2013    Past Surgical History:  Procedure Laterality Date   ANKLE RECONSTRUCTION Right 2007   [place]   ANTERIOR LAT LUMBAR FUSION Right 04/18/2016   Procedure: RIGHT LUMBAR THREE-FOUR, LUMBAR FOUR-FIVE ANTEROLATERAL LUMBAR INTERBODY FUSION;  Surgeon: Erline Levine, MD;  Location: [facility];  Service: Neurosurgery;  Laterality: Right;  RIGHT L3-4 L4-5 ANTEROLATERAL LUMBAR INTERBODY FUSION   CARDIAC CATHETERIZATION  03/03/2008   Left heart cardiac catheterization and coronary   CARDIAC CATHETERIZATION  1997   Dr Wynonia Lawman   CATARACT EXTRACTION W/ INTRAOCULAR LENS  IMPLANT, BILATERAL  2016   ESOPHAGEAL DILATION     2008   INNER EAR SURGERY     EAR INJECTION FOR [place]   KNEE ARTHROSCOPY  1991, 1995   LUMBAR LAMINECTOMY  12/2010   LUMBAR LAMINECTOMY/DECOMPRESSION MICRODISCECTOMY Left 08/15/2016   Procedure: Left Left three- four Redo laminectomy;  Surgeon: Erline Levine, MD;  Location: [facility];  Service: Neurosurgery;  Laterality: Left;  Left L3-4 Redo laminectomy   LUMBAR PERCUTANEOUS PEDICLE SCREW 2 LEVEL N/A 04/18/2016   Procedure: LUMBAR THREE-FOUR, LUMBAR FOUR-FIVE PERCUTANEOUS PEDICLE SCREW;  Surgeon: Erline Levine, MD;  Location: [facility];  Service: Neurosurgery;  Laterality: N/A;   TOTAL HIP ARTHROPLASTY Left 04/22/2017   TOTAL HIP ARTHROPLASTY Left 04/22/2017   Procedure: TOTAL HIP ARTHROPLASTY ANTERIOR APPROACH;  Surgeon: Melrose Nakayama, MD;  Location: [facility];  Service: Orthopedics;  Laterality: Left;   TOTAL KNEE ARTHROPLASTY Right 11/21/2020   Procedure: RIGHT TOTAL KNEE ARTHROPLASTY;  Surgeon: Melrose Nakayama, MD;  Location: [facility];  Service: Orthopedics;  Laterality: Right;       Family History  Problem Relation Age of Onset   Prostate cancer Father    Heart attack Brother    Kidney cancer Brother    Brain cancer Brother     Social History   Tobacco Use   Smoking status: Some Days    Pack years: 0.00    Types: Pipe   Smokeless tobacco: Never  Vaping Use   Vaping Use: Never used  Substance Use Topics   Alcohol use: Yes    Alcohol/week: 1.0 standard drink    Types: 1 Shots of liquor per week    Comment: 1-3 weekly   Drug use: No    Home Medications Prior to Admission medications   Medication Sig Start Date End Date Taking? Authorizing Provider  ARTIFICIAL TEAR OP Place 1 drop into both eyes daily as needed (dry eyes).    [provider]  carvedilol (COREG) 3.125 MG tablet Take 3.125 mg by mouth at bedtime.     [provider]  clonazePAM (KLONOPIN) 0.5 MG tablet take 1/2-1 TABLET BY MOUTH AT BEDTIME AS NEEDED Patient taking differently: Take 0.25-0.5 mg by mouth at bedtime as needed (sleep). 12/07/19   Marcial Pacas, MD  DULoxetine (CYMBALTA) 60 MG capsule Take 1 capsule (60 mg total) by mouth daily. 04/17/20   Suzzanne Cloud, NP  ELIQUIS 5 MG TABS tablet TAKE ONE TABLET BY MOUTH EVERY MORNING and TAKE ONE TABLET BY MOUTH EVERY EVENING Patient taking differently: Take 5 mg by mouth 2 (two) times daily. 06/05/20   Martinique, Peter M, MD  ferrous sulfate 324 (65 Fe) MG TBEC Take 324 mg by mouth daily.    [provider]  HYDROcodone-acetaminophen (NORCO/VICODIN) 5-325 MG tablet Take  1-2 tablets by mouth every 6 (six) hours as needed for moderate pain or severe pain (post op pain). 11/22/20   Loni Dolly, PA-C  lisinopril (ZESTRIL) 5 MG tablet Take 5 mg by mouth at bedtime.    [provider]  methocarbamol (ROBAXIN) 750 MG tablet Take 1 tablet (750 mg total) by mouth every 6 (six) hours as needed for muscle spasms. 11/22/20   Loni Dolly, PA-C  Multiple Vitamins-Minerals (MULTIVITAMIN PO) Take 1 tablet by mouth daily.    [provider]  nitroGLYCERIN (NITROSTAT) 0.4 MG SL tablet Place 0.4 mg under the tongue every 5 (five) minutes as needed for chest pain.    [provider]  omeprazole (PRILOSEC) 20 MG capsule Take 20  mg by mouth daily.    [provider]  polyethylene glycol (MIRALAX / GLYCOLAX) packet Take 17 g by mouth daily as needed for mild constipation.    [provider]  pregabalin (LYRICA) 100 MG capsule TAKE THREE CAPSULES BY MOUTH EVERY EVENING Patient taking differently: Take 300 mg by mouth every evening. 09/05/20   Suzzanne Cloud, NP  rOPINIRole (REQUIP) 0.5 MG tablet Take 1 tablet (0.5 mg total) by mouth at bedtime. 04/17/20   Suzzanne Cloud, NP  rosuvastatin (CRESTOR) 10 MG tablet TAKE 1 TABLET ONCE DAILY OR AS DIRECTED. Patient taking differently: Take 10 mg by mouth every evening. 04/22/17   Martinique, Peter M, MD  tamsulosin (FLOMAX) 0.4 MG CAPS capsule Take 0.4 mg by mouth daily. 12/08/18   [provider]    Allergies    Compazine [prochlorperazine edisylate], Penicillins, Welchol [colesevelam hcl], Gemfibrozil, and Red yeast rice [cholestin]  Review of Systems   Review of Systems  Constitutional:  Positive for fatigue. Negative for chills and fever.  HENT:  Negative for ear pain and sore throat.   Eyes:  Negative for pain and visual disturbance.  Respiratory:  Positive for shortness of breath. Negative for cough.   Cardiovascular:  Negative for chest pain and palpitations.  Gastrointestinal:   Positive for nausea. Negative for abdominal pain and vomiting.  Genitourinary:  Negative for dysuria and hematuria.  Musculoskeletal:  Positive for arthralgias. Negative for back pain.  Skin:  Negative for color change and rash.  Neurological:  Negative for seizures and syncope.  All other systems reviewed and are negative.  Physical Exam Updated Vital Signs BP 129/84   Pulse 87   Temp 98.6 F (37 C) (Oral)   Resp 15   SpO2 98%   Physical Exam Vitals and nursing note reviewed.  Constitutional:      Appearance: He is well-developed.  HENT:     Head: Normocephalic and atraumatic.  Eyes:     Conjunctiva/sclera: Conjunctivae normal.  Cardiovascular:     Rate and Rhythm: Normal rate and regular rhythm.     Heart sounds: No murmur heard. Pulmonary:     Effort: Pulmonary effort is normal. No respiratory distress.     Breath sounds: Normal breath sounds.     Comments: On 2 L nasal cannula Abdominal:     Palpations: Abdomen is soft.     Tenderness: There is no abdominal tenderness.  Musculoskeletal:     Cervical back: Neck supple.     Comments: Right lower extremity: General swelling, multiple areas of ecchymosis throughout leg, incision site over anterior knee appears intact, no erythema or warmth appreciated; DP and PT pulses present, distal sensation and cap refill intact  Skin:    General: Skin is warm and dry.  Neurological:     Mental Status: He is alert.    ED Results / Procedures / Treatments   Labs (all labs ordered are listed, but only abnormal results are displayed) Labs Reviewed  RESP PANEL BY RT-PCR (FLU A&B, COVID) [id number]  CBC WITH DIFFERENTIAL/PLATELET  COMPREHENSIVE METABOLIC PANEL  TROPONIN I (HIGH SENSITIVITY)    EKG EKG Interpretation  Date/Time:  Monday November 27 2020 13:52:31 EDT Ventricular Rate:  99 PR Interval:    QRS Duration: 80 QT Interval:  350 QTC Calculation: 449 R Axis:   -15 Text Interpretation: Atrial fibrillation with premature  ventricular or aberrantly conducted complexes Low voltage QRS Abnormal ECG Confirmed by Madalyn Rob [phone]) on 11/27/2020 2:09:00 PM  Radiology  DG Chest Portable 1 View  Result Date: 11/27/2020 CLINICAL DATA:  Chest pain short of breath EXAM: PORTABLE CHEST 1 VIEW COMPARISON:  11/14/2020 FINDINGS: The heart size and mediastinal contours are within normal limits. Both lungs are clear. The visualized skeletal structures are unremarkable. Hiatal hernia. IMPRESSION: No active disease.  Hiatal hernia Electronically Signed   By: Donavan Foil M.D.   On: 11/27/2020 15:12    Procedures Procedures   Medications Ordered in ED Medications - No data to display  ED Course  I have reviewed the triage vital signs and the nursing notes.  Pertinent labs & imaging results that were available during my care of the patient were reviewed by me and considered in my medical decision making (see chart for details).    MDM Rules/Calculators/A&P                          80 year old male presenting to ER with concern for shortness of breath.  Also having right leg swelling in the setting of recent right knee surgery.  On exam patient not in acute distress.  Currently on 2 L nasal cannula.  EMS had reported some hypoxia.  Given recent surgery, concern for increased risk of DVT/PE.  Initially ordered basic labs, EKG, troponin and DVT study.  EKG shows afib - has known hx.  Preliminary DVT read was negative.  At time of signout, CTA chest and remainder of labs pending.  Please refer to Dr. Sanjuana Kava note for final plan and disposition.  Final Clinical Impression(s) / ED Diagnoses Final diagnoses:  Shortness of breath    Rx / DC Orders ED Discharge Orders     None        Lucrezia Starch, MD 11/27/20 1534

## 2020-11-27 NOTE — ED Triage Notes (Signed)
Pt here from home with c/o sob and swelling to the right leg ,which is warm to touch and swollen , pt was placed on 5 liters O2 n/c by ems due to low satss

## 2020-11-27 NOTE — Discharge Instructions (Addendum)
Follow-up with cardiologist this week to discuss any need for further atrial fibrillation treatment.  You have already received your home dose of Coreg today.  Make sure you take your next dose tomorrow.  Follow-up with your orthopedic as scheduled.  Continue to ice your right lower extremity aggressively.  Please return if symptoms worsen.

## 2020-11-27 NOTE — ED Provider Notes (Signed)
Emergency Medicine Provider Triage Evaluation Note  [name] , a 80 y.o. male  was evaluated in triage.  Pt complains of rle swelling, chest pain and sob that started about 5 days ago. Reports sweats and chills at home. Has also had cough. Is anticoagulated.  Review of Systems  Positive: Chest pain, sob, cough, fever, leg swelling Negative: vomiting  Physical Exam  BP 139/87 (BP Location: Right Arm)   Pulse 96   Temp 98.6 F (37 C) (Oral)   Resp 18   SpO2 100%  Gen:   Awake, no distress   Resp:  Normal effort  MSK:   Moves extremities without difficulty  Other:  Rle swelling compared to right, extensive ecchymosis to the rle, tachypneic, irregularly irregular rhythm  Medical Decision Making  Medically screening exam initiated at 1:53 PM.  Appropriate orders placed.  [name] was informed that the remainder of the evaluation will be completed by another provider, this initial triage assessment does not replace that evaluation, and the importance of remaining in the ED until their evaluation is complete.     Rodney Booze, PA-C 11/27/20 1359    Lorelle Gibbs, DO 11/30/20 1110

## 2020-11-27 NOTE — Progress Notes (Signed)
Lower extremity venous RT study completed.  Preliminary results relayed to provider in ED.   See CV Proc for preliminary results report.   Darlin Coco, RDMS, RVT

## 2020-11-27 NOTE — ED Notes (Signed)
Returned from CT.

## 2020-11-27 NOTE — ED Notes (Signed)
Patient transported to CT 

## 2020-11-27 NOTE — ED Provider Notes (Signed)
Patient handed off to me at 3 PM.  Patient with negative DVT study, negative PE study.  Lab work is overall unremarkable.  Has good range of motion of his right lower extremity.  No concern for septic joint or infectious process.  No fever, no white count.  He does have some bruising and that appears to be dependent with some bruising that extends down into his lower leg and upper leg but overall range of motion at the knee is fairly good.  He has not had any falls.  Vital signs are unremarkable.  Hemoglobin is 11.2 from 16.  We will check a second hemoglobin although clinically it does not appear that he has any active bleeding.  There does not appear to be any active hematoma and overall some soft tissue bruising is likely expected from recent surgery.  Also, it appears that he is back in atrial fibrillation although heart rate mostly in the 90s-120s.  We will give him a dose of diltiazem and re-eval. Does not appear to be in RVR at this time, but overall suspect drop in hemoglobin likely the trigger for atrial fibrillation.  Repeat hemoglobin stable at 10.6.  No concern for bleed at this time.  Heart rate stable in the 80s and 90s following diltiazem and Coreg.  Patient was able to ambulate to the bathroom without much difficulty.  Had a long discussion with family at the bedside about the need for aggressive icing of his right lower leg.  May be little less activity over the next day or 2 to let some of the inflammation get out and bruising to improve.  Recommend that they follow closely with cardiology to discuss the need possibly for further atrial fibrillation treatment.  Patient is not in RVR and he is tolerating heart rates in the 80s and 90s but may benefit from some more support.  Overall discharged in good condition.  Understands return precautions.  This chart was dictated using voice recognition software.  Despite best efforts to proofread,  errors can occur which can change the documentation  meaning.    Lennice Sites, DO 11/27/20 1924

## 2020-11-29 DIAGNOSIS — I4892 Unspecified atrial flutter: Secondary | ICD-10-CM | POA: Diagnosis not present

## 2020-11-29 DIAGNOSIS — G2581 Restless legs syndrome: Secondary | ICD-10-CM | POA: Diagnosis not present

## 2020-11-29 DIAGNOSIS — I129 Hypertensive chronic kidney disease with stage 1 through stage 4 chronic kidney disease, or unspecified chronic kidney disease: Secondary | ICD-10-CM | POA: Diagnosis not present

## 2020-11-29 DIAGNOSIS — N183 Chronic kidney disease, stage 3 unspecified: Secondary | ICD-10-CM | POA: Diagnosis not present

## 2020-11-29 DIAGNOSIS — Z471 Aftercare following joint replacement surgery: Secondary | ICD-10-CM | POA: Diagnosis not present

## 2020-11-29 DIAGNOSIS — M47816 Spondylosis without myelopathy or radiculopathy, lumbar region: Secondary | ICD-10-CM | POA: Diagnosis not present

## 2020-11-29 DIAGNOSIS — G4700 Insomnia, unspecified: Secondary | ICD-10-CM | POA: Diagnosis not present

## 2020-11-29 DIAGNOSIS — M1712 Unilateral primary osteoarthritis, left knee: Secondary | ICD-10-CM | POA: Diagnosis not present

## 2020-11-29 DIAGNOSIS — Z974 Presence of external hearing-aid: Secondary | ICD-10-CM | POA: Diagnosis not present

## 2020-11-29 DIAGNOSIS — Z961 Presence of intraocular lens: Secondary | ICD-10-CM | POA: Diagnosis not present

## 2020-11-29 DIAGNOSIS — E782 Mixed hyperlipidemia: Secondary | ICD-10-CM | POA: Diagnosis not present

## 2020-11-29 DIAGNOSIS — F17290 Nicotine dependence, other tobacco product, uncomplicated: Secondary | ICD-10-CM | POA: Diagnosis not present

## 2020-11-29 DIAGNOSIS — Z96651 Presence of right artificial knee joint: Secondary | ICD-10-CM | POA: Diagnosis not present

## 2020-11-29 DIAGNOSIS — Z7901 Long term (current) use of anticoagulants: Secondary | ICD-10-CM | POA: Diagnosis not present

## 2020-11-29 DIAGNOSIS — K219 Gastro-esophageal reflux disease without esophagitis: Secondary | ICD-10-CM | POA: Diagnosis not present

## 2020-11-29 DIAGNOSIS — I4819 Other persistent atrial fibrillation: Secondary | ICD-10-CM | POA: Diagnosis not present

## 2020-11-29 DIAGNOSIS — H9193 Unspecified hearing loss, bilateral: Secondary | ICD-10-CM | POA: Diagnosis not present

## 2020-11-29 DIAGNOSIS — K5120 Ulcerative (chronic) proctitis without complications: Secondary | ICD-10-CM | POA: Diagnosis not present

## 2020-11-29 DIAGNOSIS — M48062 Spinal stenosis, lumbar region with neurogenic claudication: Secondary | ICD-10-CM | POA: Diagnosis not present

## 2020-11-30 ENCOUNTER — Other Ambulatory Visit: Payer: Self-pay | Admitting: Cardiology

## 2020-11-30 DIAGNOSIS — Z974 Presence of external hearing-aid: Secondary | ICD-10-CM | POA: Diagnosis not present

## 2020-11-30 DIAGNOSIS — Z471 Aftercare following joint replacement surgery: Secondary | ICD-10-CM | POA: Diagnosis not present

## 2020-11-30 DIAGNOSIS — H9193 Unspecified hearing loss, bilateral: Secondary | ICD-10-CM | POA: Diagnosis not present

## 2020-11-30 DIAGNOSIS — N183 Chronic kidney disease, stage 3 unspecified: Secondary | ICD-10-CM | POA: Diagnosis not present

## 2020-11-30 DIAGNOSIS — F17290 Nicotine dependence, other tobacco product, uncomplicated: Secondary | ICD-10-CM | POA: Diagnosis not present

## 2020-11-30 DIAGNOSIS — M47816 Spondylosis without myelopathy or radiculopathy, lumbar region: Secondary | ICD-10-CM | POA: Diagnosis not present

## 2020-11-30 DIAGNOSIS — Z961 Presence of intraocular lens: Secondary | ICD-10-CM | POA: Diagnosis not present

## 2020-11-30 DIAGNOSIS — I4819 Other persistent atrial fibrillation: Secondary | ICD-10-CM | POA: Diagnosis not present

## 2020-11-30 DIAGNOSIS — Z7901 Long term (current) use of anticoagulants: Secondary | ICD-10-CM | POA: Diagnosis not present

## 2020-11-30 DIAGNOSIS — I129 Hypertensive chronic kidney disease with stage 1 through stage 4 chronic kidney disease, or unspecified chronic kidney disease: Secondary | ICD-10-CM | POA: Diagnosis not present

## 2020-11-30 DIAGNOSIS — K5120 Ulcerative (chronic) proctitis without complications: Secondary | ICD-10-CM | POA: Diagnosis not present

## 2020-11-30 DIAGNOSIS — M48062 Spinal stenosis, lumbar region with neurogenic claudication: Secondary | ICD-10-CM | POA: Diagnosis not present

## 2020-11-30 DIAGNOSIS — I4892 Unspecified atrial flutter: Secondary | ICD-10-CM | POA: Diagnosis not present

## 2020-11-30 DIAGNOSIS — M1712 Unilateral primary osteoarthritis, left knee: Secondary | ICD-10-CM | POA: Diagnosis not present

## 2020-11-30 DIAGNOSIS — E782 Mixed hyperlipidemia: Secondary | ICD-10-CM | POA: Diagnosis not present

## 2020-11-30 DIAGNOSIS — K219 Gastro-esophageal reflux disease without esophagitis: Secondary | ICD-10-CM | POA: Diagnosis not present

## 2020-11-30 DIAGNOSIS — G2581 Restless legs syndrome: Secondary | ICD-10-CM | POA: Diagnosis not present

## 2020-11-30 DIAGNOSIS — G4700 Insomnia, unspecified: Secondary | ICD-10-CM | POA: Diagnosis not present

## 2020-11-30 DIAGNOSIS — Z96651 Presence of right artificial knee joint: Secondary | ICD-10-CM | POA: Diagnosis not present

## 2020-11-30 NOTE — Telephone Encounter (Signed)
Rx(s) sent to pharmacy electronically.  

## 2020-12-01 DIAGNOSIS — Z96651 Presence of right artificial knee joint: Secondary | ICD-10-CM | POA: Diagnosis not present

## 2020-12-01 DIAGNOSIS — M6281 Muscle weakness (generalized): Secondary | ICD-10-CM | POA: Diagnosis not present

## 2020-12-01 DIAGNOSIS — Z471 Aftercare following joint replacement surgery: Secondary | ICD-10-CM | POA: Diagnosis not present

## 2020-12-01 DIAGNOSIS — M25661 Stiffness of right knee, not elsewhere classified: Secondary | ICD-10-CM | POA: Diagnosis not present

## 2020-12-05 DIAGNOSIS — M6281 Muscle weakness (generalized): Secondary | ICD-10-CM | POA: Diagnosis not present

## 2020-12-05 DIAGNOSIS — Z96651 Presence of right artificial knee joint: Secondary | ICD-10-CM | POA: Diagnosis not present

## 2020-12-05 DIAGNOSIS — M25661 Stiffness of right knee, not elsewhere classified: Secondary | ICD-10-CM | POA: Diagnosis not present

## 2020-12-07 DIAGNOSIS — M25661 Stiffness of right knee, not elsewhere classified: Secondary | ICD-10-CM | POA: Diagnosis not present

## 2020-12-07 DIAGNOSIS — Z96651 Presence of right artificial knee joint: Secondary | ICD-10-CM | POA: Diagnosis not present

## 2020-12-07 DIAGNOSIS — M6281 Muscle weakness (generalized): Secondary | ICD-10-CM | POA: Diagnosis not present

## 2020-12-15 ENCOUNTER — Encounter (HOSPITAL_COMMUNITY): Payer: Self-pay

## 2020-12-15 ENCOUNTER — Other Ambulatory Visit: Payer: Self-pay

## 2020-12-15 ENCOUNTER — Emergency Department (HOSPITAL_COMMUNITY): Payer: Medicare Other

## 2020-12-15 ENCOUNTER — Observation Stay (HOSPITAL_COMMUNITY)
Admission: EM | Admit: 2020-12-15 | Discharge: 2020-12-18 | Disposition: A | Discharge status: Home / Self Care | Payer: Medicare Other | Attending: Internal Medicine | Admitting: Internal Medicine

## 2020-12-15 DIAGNOSIS — F17290 Nicotine dependence, other tobacco product, uncomplicated: Secondary | ICD-10-CM | POA: Diagnosis not present

## 2020-12-15 DIAGNOSIS — Z96651 Presence of right artificial knee joint: Secondary | ICD-10-CM | POA: Insufficient documentation

## 2020-12-15 DIAGNOSIS — I482 Chronic atrial fibrillation, unspecified: Secondary | ICD-10-CM | POA: Insufficient documentation

## 2020-12-15 DIAGNOSIS — R109 Unspecified abdominal pain: Secondary | ICD-10-CM | POA: Diagnosis not present

## 2020-12-15 DIAGNOSIS — Z7901 Long term (current) use of anticoagulants: Secondary | ICD-10-CM | POA: Insufficient documentation

## 2020-12-15 DIAGNOSIS — F4321 Adjustment disorder with depressed mood: Secondary | ICD-10-CM | POA: Diagnosis present

## 2020-12-15 DIAGNOSIS — K2980 Duodenitis without bleeding: Secondary | ICD-10-CM | POA: Insufficient documentation

## 2020-12-15 DIAGNOSIS — Z20822 Contact with and (suspected) exposure to covid-19: Secondary | ICD-10-CM | POA: Diagnosis not present

## 2020-12-15 DIAGNOSIS — K3189 Other diseases of stomach and duodenum: Principal | ICD-10-CM | POA: Insufficient documentation

## 2020-12-15 DIAGNOSIS — D649 Anemia, unspecified: Secondary | ICD-10-CM | POA: Diagnosis not present

## 2020-12-15 DIAGNOSIS — I10 Essential (primary) hypertension: Secondary | ICD-10-CM | POA: Diagnosis not present

## 2020-12-15 DIAGNOSIS — K269 Duodenal ulcer, unspecified as acute or chronic, without hemorrhage or perforation: Secondary | ICD-10-CM | POA: Diagnosis not present

## 2020-12-15 DIAGNOSIS — Z96642 Presence of left artificial hip joint: Secondary | ICD-10-CM | POA: Diagnosis not present

## 2020-12-15 DIAGNOSIS — D62 Acute posthemorrhagic anemia: Secondary | ICD-10-CM | POA: Diagnosis not present

## 2020-12-15 DIAGNOSIS — K922 Gastrointestinal hemorrhage, unspecified: Secondary | ICD-10-CM | POA: Diagnosis present

## 2020-12-15 DIAGNOSIS — K921 Melena: Secondary | ICD-10-CM | POA: Diagnosis not present

## 2020-12-15 DIAGNOSIS — Y900 Blood alcohol level of less than 20 mg/100 ml: Secondary | ICD-10-CM | POA: Diagnosis not present

## 2020-12-15 DIAGNOSIS — K449 Diaphragmatic hernia without obstruction or gangrene: Secondary | ICD-10-CM | POA: Diagnosis not present

## 2020-12-15 DIAGNOSIS — R45851 Suicidal ideations: Secondary | ICD-10-CM

## 2020-12-15 DIAGNOSIS — K5792 Diverticulitis of intestine, part unspecified, without perforation or abscess without bleeding: Secondary | ICD-10-CM | POA: Diagnosis not present

## 2020-12-15 DIAGNOSIS — Z79899 Other long term (current) drug therapy: Secondary | ICD-10-CM | POA: Insufficient documentation

## 2020-12-15 DIAGNOSIS — K5790 Diverticulosis of intestine, part unspecified, without perforation or abscess without bleeding: Secondary | ICD-10-CM

## 2020-12-15 DIAGNOSIS — Z791 Long term (current) use of non-steroidal anti-inflammatories (NSAID): Secondary | ICD-10-CM | POA: Diagnosis not present

## 2020-12-15 LAB — URINALYSIS, ROUTINE W REFLEX MICROSCOPIC
Bilirubin Urine: NEGATIVE
Glucose, UA: NEGATIVE mg/dL
Hgb urine dipstick: NEGATIVE
Ketones, ur: 5 mg/dL — AB
Leukocytes,Ua: NEGATIVE
Nitrite: NEGATIVE
Protein, ur: NEGATIVE mg/dL
Specific Gravity, Urine: 1.015 (ref 1.005–1.030)
pH: 5 (ref 5.0–8.0)

## 2020-12-15 LAB — RAPID URINE DRUG SCREEN, HOSP PERFORMED
Amphetamines: NOT DETECTED
Barbiturates: NOT DETECTED
Benzodiazepines: NOT DETECTED
Cocaine: NOT DETECTED
Opiates: POSITIVE — AB
Tetrahydrocannabinol: NOT DETECTED

## 2020-12-15 LAB — CBC
HCT: 27 % — ABNORMAL LOW (ref 39.0–52.0)
Hemoglobin: 8.6 g/dL — ABNORMAL LOW (ref 13.0–17.0)
MCH: 33.5 pg (ref 26.0–34.0)
MCHC: 31.9 g/dL (ref 30.0–36.0)
MCV: 105.1 fL — ABNORMAL HIGH (ref 80.0–100.0)
Platelets: 266 10*3/uL (ref 150–400)
RBC: 2.57 MIL/uL — ABNORMAL LOW (ref 4.22–5.81)
RDW: 17.3 % — ABNORMAL HIGH (ref 11.5–15.5)
WBC: 6.5 10*3/uL (ref 4.0–10.5)
nRBC: 0 % (ref 0.0–0.2)

## 2020-12-15 LAB — COMPREHENSIVE METABOLIC PANEL
ALT: 21 U/L (ref 0–44)
AST: 35 U/L (ref 15–41)
Albumin: 3.8 g/dL (ref 3.5–5.0)
Alkaline Phosphatase: 60 U/L (ref 38–126)
Anion gap: 11 (ref 5–15)
BUN: 23 mg/dL (ref 8–23)
CO2: 22 mmol/L (ref 22–32)
Calcium: 8.9 mg/dL (ref 8.9–10.3)
Chloride: 101 mmol/L (ref 98–111)
Creatinine, Ser: 1.26 mg/dL — ABNORMAL HIGH (ref 0.61–1.24)
GFR, Estimated: 57 mL/min — ABNORMAL LOW (ref >60)
Glucose, Bld: 98 mg/dL (ref 70–99)
Potassium: 3.9 mmol/L (ref 3.5–5.1)
Sodium: 134 mmol/L — ABNORMAL LOW (ref 135–145)
Total Bilirubin: 0.9 mg/dL (ref 0.3–1.2)
Total Protein: 6.1 g/dL — ABNORMAL LOW (ref 6.5–8.1)

## 2020-12-15 LAB — ACETAMINOPHEN LEVEL: Acetaminophen (Tylenol), Serum: 18 ug/mL (ref 10–30)

## 2020-12-15 LAB — CBC WITH DIFFERENTIAL/PLATELET
Abs Immature Granulocytes: 0.01 10*3/uL (ref 0.00–0.07)
Basophils Absolute: 0 10*3/uL (ref 0.0–0.1)
Basophils Relative: 1 %
Eosinophils Absolute: 0.1 10*3/uL (ref 0.0–0.5)
Eosinophils Relative: 2 %
HCT: 25.5 % — ABNORMAL LOW (ref 39.0–52.0)
Hemoglobin: 8.3 g/dL — ABNORMAL LOW (ref 13.0–17.0)
Immature Granulocytes: 0 %
Lymphocytes Relative: 16 %
Lymphs Abs: 1 10*3/uL (ref 0.7–4.0)
MCH: 34.4 pg — ABNORMAL HIGH (ref 26.0–34.0)
MCHC: 32.5 g/dL (ref 30.0–36.0)
MCV: 105.8 fL — ABNORMAL HIGH (ref 80.0–100.0)
Monocytes Absolute: 0.6 10*3/uL (ref 0.1–1.0)
Monocytes Relative: 9 %
Neutro Abs: 4.7 10*3/uL (ref 1.7–7.7)
Neutrophils Relative %: 72 %
Platelets: 278 10*3/uL (ref 150–400)
RBC: 2.41 MIL/uL — ABNORMAL LOW (ref 4.22–5.81)
RDW: 16.2 % — ABNORMAL HIGH (ref 11.5–15.5)
WBC: 6.5 10*3/uL (ref 4.0–10.5)
nRBC: 0 % (ref 0.0–0.2)

## 2020-12-15 LAB — RESP PANEL BY RT-PCR (FLU A&B, COVID) ARPGX2
Influenza A by PCR: NEGATIVE
Influenza B by PCR: NEGATIVE
SARS Coronavirus 2 by RT PCR: NEGATIVE

## 2020-12-15 LAB — ETHANOL: Alcohol, Ethyl (B): <10 mg/dL (ref <10)

## 2020-12-15 LAB — PREPARE RBC (CROSSMATCH)

## 2020-12-15 LAB — POC OCCULT BLOOD, ED: Fecal Occult Bld: POSITIVE — AB

## 2020-12-15 LAB — SALICYLATE LEVEL: Salicylate Lvl: <7 mg/dL — ABNORMAL LOW (ref 7.0–30.0)

## 2020-12-15 MED ORDER — ONDANSETRON HCL 4 MG/2ML IJ SOLN
4.0000 mg | Freq: Once | INTRAMUSCULAR | Status: AC
  Administered 2020-12-15: 4 mg via INTRAVENOUS
  Filled 2020-12-15: qty 2

## 2020-12-15 MED ORDER — ACETAMINOPHEN 650 MG RE SUPP: 650.0000 mg | Freq: Four times a day (QID) | RECTAL | Status: DC | PRN

## 2020-12-15 MED ORDER — PANTOPRAZOLE INFUSION (NEW) - SIMPLE MED
8.0000 mg/h | INTRAVENOUS | Status: DC
  Administered 2020-12-15 – 2020-12-16 (×2): 8 mg/h via INTRAVENOUS
  Filled 2020-12-15: qty 80
  Filled 2020-12-15: qty 100
  Filled 2020-12-15: qty 80
  Filled 2020-12-15: qty 100

## 2020-12-15 MED ORDER — PANTOPRAZOLE SODIUM 40 MG IV SOLR: 40.0000 mg | Freq: Two times a day (BID) | INTRAVENOUS | Status: DC

## 2020-12-15 MED ORDER — SODIUM CHLORIDE 0.9 % IV SOLN
10.0000 mL/h | Freq: Once | INTRAVENOUS | Status: AC
  Administered 2020-12-15: 10 mL/h via INTRAVENOUS

## 2020-12-15 MED ORDER — ACETAMINOPHEN 325 MG PO TABS
650.0000 mg | ORAL_TABLET | Freq: Four times a day (QID) | ORAL | Status: DC | PRN
  Administered 2020-12-17: 650 mg via ORAL
  Filled 2020-12-15: qty 2

## 2020-12-15 MED ORDER — MORPHINE SULFATE (PF) 2 MG/ML IV SOLN
0.5000 mg | INTRAVENOUS | Status: DC | PRN
  Administered 2020-12-15 – 2020-12-16 (×4): 0.5 mg via INTRAVENOUS
  Filled 2020-12-15 (×4): qty 1

## 2020-12-15 MED ORDER — CARVEDILOL 3.125 MG PO TABS
3.1250 mg | ORAL_TABLET | Freq: Every day | ORAL | Status: DC
  Administered 2020-12-15 – 2020-12-17 (×3): 3.125 mg via ORAL
  Filled 2020-12-15 (×3): qty 1

## 2020-12-15 MED ORDER — IOHEXOL 350 MG/ML SOLN
100.0000 mL | Freq: Once | INTRAVENOUS | Status: AC | PRN
  Administered 2020-12-15: 80 mL via INTRAVENOUS

## 2020-12-15 MED ORDER — MORPHINE SULFATE (PF) 4 MG/ML IV SOLN
4.0000 mg | Freq: Once | INTRAVENOUS | Status: AC
  Administered 2020-12-15: 4 mg via INTRAVENOUS
  Filled 2020-12-15: qty 1

## 2020-12-15 NOTE — ED Notes (Signed)
Dinner tray given to pt

## 2020-12-15 NOTE — Consult Note (Signed)
Referring Provider: ED Primary Care Physician:  Cari Caraway, MD Primary Gastroenterologist:  Dr. Watt Climes [facility])  Reason for Consultation:  Acute duodenitis on CT, melena, anemia  HPI: [name] is a 80 y.o. male with past medical history of A fib (on Eliquis), HTN, and CKD presenting for consultation of acute duodenitis on CT, melena, and anemia.  Patient presented to the ED due to poor oral intake.  He states he has had poor intake since his wife passed 4 months ago.  However, last night, he started having black bowel movements.  He states he has had 2 large melenic stools with flecks of red blood.  Also reports some epigastric abdominal pain that started today.  He has some nausea but denies any vomiting.  He believes he has lost weight since his wife's passing.  He takes Eliquis, but states last dose was 2 days ago.  Had a recent knee replacement and has been taking ibuprofen 800 mg BID for the last several weelks.  He reports a colonoscopy in 2017 but cannot recall who completed it. Last colonoscopy at [facility] was in 2011 and was normal with the exception of diverticulosis.     Past Medical History:  Diagnosis Date   Anginal pain ([place])    NONE IN 3 YEARS   Arthritis    Atrial fibrillation ([place]) 1962   a. No recurrence since 1962.   Chest pain    a. 1998 Cath: nl cors;  b. 2009 Cath: nl cors.   Depression    Dysrhythmia    NO TROUBLE IN 1 YR    DR. PETER Martinique    Esophageal reflux    Essential hypertension    History of Paroxysmal atrial flutter (HCC)    Hyperlipidemia    Incontinence of urine    Leg pain, bilateral    Nocturia    PONV (postoperative nausea and vomiting)    CONSTIPATED   Scarlet fever 1945   Spinal stenosis    Staph skin infection    LEFT GROIN  04/11/16  TX W/ DOXYCYCLINE   Ulcerative proctitis ([place])     Past Surgical History:  Procedure Laterality Date   ANKLE RECONSTRUCTION Right 2007   [place]   ANTERIOR LAT LUMBAR FUSION Right  04/18/2016   Procedure: RIGHT LUMBAR THREE-FOUR, LUMBAR FOUR-FIVE ANTEROLATERAL LUMBAR INTERBODY FUSION;  Surgeon: Erline Levine, MD;  Location: [place];  Service: Neurosurgery;  Laterality: Right;  RIGHT L3-4 L4-5 ANTEROLATERAL LUMBAR INTERBODY FUSION   CARDIAC CATHETERIZATION  03/03/2008   Left heart cardiac catheterization and coronary   CARDIAC CATHETERIZATION  1997   Dr Wynonia Lawman   CATARACT EXTRACTION W/ INTRAOCULAR LENS  IMPLANT, BILATERAL     2016   ESOPHAGEAL DILATION     2008   INNER EAR SURGERY     EAR INJECTION FOR MENIERES   KNEE ARTHROSCOPY  1991, 1995   LUMBAR LAMINECTOMY  12/2010   LUMBAR LAMINECTOMY/DECOMPRESSION MICRODISCECTOMY Left 08/15/2016   Procedure: Left Left three- four Redo laminectomy;  Surgeon: Erline Levine, MD;  Location: [place];  Service: Neurosurgery;  Laterality: Left;  Left L3-4 Redo laminectomy   LUMBAR PERCUTANEOUS PEDICLE SCREW 2 LEVEL N/A 04/18/2016   Procedure: LUMBAR THREE-FOUR, LUMBAR FOUR-FIVE PERCUTANEOUS PEDICLE SCREW;  Surgeon: Erline Levine, MD;  Location: [place];  Service: Neurosurgery;  Laterality: N/A;   TOTAL HIP ARTHROPLASTY Left 04/22/2017   TOTAL HIP ARTHROPLASTY Left 04/22/2017   Procedure: TOTAL HIP ARTHROPLASTY ANTERIOR APPROACH;  Surgeon: Melrose Nakayama, MD;  Location:  [facility];  Service: Orthopedics;  Laterality: Left;   TOTAL KNEE ARTHROPLASTY Right 11/21/2020   Procedure: RIGHT TOTAL KNEE ARTHROPLASTY;  Surgeon: Melrose Nakayama, MD;  Location: [facility];  Service: Orthopedics;  Laterality: Right;    Prior to Admission medications   Medication Sig Start Date End Date Taking? Authorizing Provider  ARTIFICIAL TEAR OP Place 1 drop into both eyes daily as needed (dry eyes).    [provider]  carvedilol (COREG) 3.125 MG tablet Take 3.125 mg by mouth at bedtime.     [provider]  clonazePAM (KLONOPIN) 0.5 MG tablet take 1/2-1 TABLET BY MOUTH AT BEDTIME AS NEEDED Patient taking differently: Take 0.25-0.5 mg by mouth at bedtime as  needed (sleep). 12/07/19   Marcial Pacas, MD  DULoxetine (CYMBALTA) 60 MG capsule Take 1 capsule (60 mg total) by mouth daily. 04/17/20   Suzzanne Cloud, NP  ELIQUIS 5 MG TABS tablet Take 1 tablet (5 mg total) by mouth 2 (two) times daily. 11/30/20   Martinique, Peter M, MD  ferrous sulfate 324 (65 Fe) MG TBEC Take 324 mg by mouth daily.    [provider]  HYDROcodone-acetaminophen (NORCO/VICODIN) 5-325 MG tablet Take 1-2 tablets by mouth every 6 (six) hours as needed for moderate pain or severe pain (post op pain). 11/22/20   Loni Dolly, PA-C  lisinopril (ZESTRIL) 5 MG tablet Take 5 mg by mouth at bedtime.    [provider]  methocarbamol (ROBAXIN) 750 MG tablet Take 1 tablet (750 mg total) by mouth every 6 (six) hours as needed for muscle spasms. 11/22/20   Loni Dolly, PA-C  Multiple Vitamins-Minerals (MULTIVITAMIN PO) Take 1 tablet by mouth daily.    [provider]  nitroGLYCERIN (NITROSTAT) 0.4 MG SL tablet Place 0.4 mg under the tongue every 5 (five) minutes as needed for chest pain.    [provider]  omeprazole (PRILOSEC) 20 MG capsule Take 20 mg by mouth daily.    [provider]  polyethylene glycol (MIRALAX / GLYCOLAX) packet Take 17 g by mouth daily as needed for mild constipation.    [provider]  pregabalin (LYRICA) 100 MG capsule TAKE THREE CAPSULES BY MOUTH EVERY EVENING Patient taking differently: Take 300 mg by mouth every evening. 09/05/20   Suzzanne Cloud, NP  rOPINIRole (REQUIP) 0.5 MG tablet Take 1 tablet (0.5 mg total) by mouth at bedtime. 04/17/20   Suzzanne Cloud, NP  rosuvastatin (CRESTOR) 10 MG tablet TAKE 1 TABLET ONCE DAILY OR AS DIRECTED. Patient taking differently: Take 10 mg by mouth every evening. 04/22/17   Martinique, Peter M, MD  tamsulosin (FLOMAX) 0.4 MG CAPS capsule Take 0.4 mg by mouth daily. 12/08/18   [provider]    Scheduled Meds: Continuous Infusions:  sodium chloride     PRN  Meds:.  Allergies as of 12/15/2020 - Review Complete 12/15/2020  Allergen Reaction Noted   Compazine [prochlorperazine edisylate] Other (See Comments) 04/19/2014   Penicillins Nausea And Vomiting, Rash, and Other (See Comments) 07/05/2013   Welchol [colesevelam hcl] Other (See Comments) 10/19/2013   Gemfibrozil Nausea Only 07/05/2013   Red yeast rice [cholestin] Other (See Comments) 08/06/2016    Family History  Problem Relation Age of Onset   Prostate cancer Father    Heart attack Brother    Kidney cancer Brother    Brain cancer Brother     Social History   Socioeconomic History   Marital status: Widowed    Spouse name: Bethena Roys  Number of children: 3   Years of education: college   Highest education level: Not on file  Occupational History    Comment: Retired   Tobacco Use   Smoking status: Some Days    Types: Pipe   Smokeless tobacco: Never  Vaping Use   Vaping Use: Never used  Substance and Sexual Activity   Alcohol use: Yes    Alcohol/week: 1.0 standard drink    Types: 1 Shots of liquor per week    Comment: occasionally   Drug use: No   Sexual activity: Not on file  Other Topics Concern   Not on file  Social History Narrative   Patient is retired and lives at home with his wife Bethena Roys.    Education college.   Caffeine one cup daily.   Social Determinants of Health   Financial Resource Strain: Not on file  Food Insecurity: Not on file  Transportation Needs: Not on file  Physical Activity: Not on file  Stress: Not on file  Social Connections: Not on file  Intimate Partner Violence: Not on file    Review of Systems: Review of Systems  Constitutional:  Positive for malaise/fatigue and weight loss. Negative for chills and fever.  HENT:  Negative for hearing loss and tinnitus.   Eyes:  Negative for pain and redness.  Respiratory:  Negative for cough and shortness of breath.   Cardiovascular:  Negative for chest pain and palpitations.  Gastrointestinal:   Positive for abdominal pain, melena and nausea. Negative for blood in stool, constipation, diarrhea, heartburn and vomiting.  Genitourinary:  Negative for flank pain and hematuria.  Musculoskeletal:  Positive for joint pain. Negative for falls.  Skin:  Negative for itching and rash.  Neurological:  Negative for seizures and loss of consciousness.  Endo/Heme/Allergies:  Negative for polydipsia. Does not bruise/bleed easily.  Psychiatric/Behavioral:  Positive for depression. Negative for substance abuse.     Physical Exam: Vital signs: Vitals:   12/15/20 1145 12/15/20 1200  BP: 131/86 131/78  Pulse:  88  Resp: 15 15  Temp:    SpO2:  100%      Physical Exam Vitals reviewed.  Constitutional:      General: He is not in acute distress. HENT:     Head: Normocephalic and atraumatic.     Nose: Nose normal. No congestion.     Mouth/Throat:     Mouth: Mucous membranes are moist.     Pharynx: Oropharynx is clear.  Eyes:     Extraocular Movements: Extraocular movements intact.     Comments: Conjunctival pallor  Cardiovascular:     Rate and Rhythm: Normal rate. Rhythm irregular.  Pulmonary:     Effort: Pulmonary effort is normal. No respiratory distress.  Abdominal:     General: Bowel sounds are normal. There is no distension.     Palpations: Abdomen is soft. There is no mass.     Tenderness: There is abdominal tenderness (Mild epigastric). There is no guarding or rebound.     Hernia: No hernia is present.  Musculoskeletal:        General: No swelling or tenderness.     Cervical back: Normal range of motion and neck supple.  Skin:    General: Skin is warm and dry.  Neurological:     General: No focal deficit present.     Mental Status: He is oriented to person, place, and time. He is lethargic.  Psychiatric:        Mood and Affect: Mood  normal.        Behavior: Behavior normal. Behavior is cooperative.    GI:  Lab Results: Recent Labs    12/15/20 0843  WBC 6.5  HGB  8.3*  HCT 25.5*  PLT 278   BMET Recent Labs    12/15/20 0843  NA 134*  K 3.9  CL 101  CO2 22  GLUCOSE 98  BUN 23  CREATININE 1.26*  CALCIUM 8.9   LFT Recent Labs    12/15/20 0843  PROT 6.1*  ALBUMIN 3.8  AST 35  ALT 21  ALKPHOS 60  BILITOT 0.9   PT/INR No results for input(s): LABPROT, INR in the last 72 hours.   Studies/Results: CT Abdomen Pelvis W Contrast  Result Date: 12/15/2020 CLINICAL DATA:  Abdominal pain. EXAM: CT ABDOMEN AND PELVIS WITH CONTRAST TECHNIQUE: Multidetector CT imaging of the abdomen and pelvis was performed using the standard protocol following bolus administration of intravenous contrast. CONTRAST:  33mL OMNIPAQUE IOHEXOL 350 MG/ML SOLN COMPARISON:  05/29/2019 FINDINGS: Lower chest: No acute abnormality. Hepatobiliary: No focal liver abnormality is seen. No gallstones, gallbladder wall thickening, or biliary dilatation. Pancreas: Unremarkable. No pancreatic ductal dilatation or surrounding inflammatory changes. Spleen: Normal in size without focal abnormality. Adrenals/Urinary Tract: Normal adrenal glands. Bilateral lower pole kidney cysts are identified which measure up to 2.5 cm. No kidney stones or signs of hydronephrosis identified bilaterally. Bladder unremarkable. Stomach/Bowel: There is a large hiatal hernia. Wall thickening, mucosal enhancement, and surrounding inflammatory fat stranding is noted involving the second portion of the duodenum. No signs of perforation. The remaining small bowel loops are within normal limits. Diffuse colonic diverticulosis noted without signs of acute diverticulitis. Vascular/Lymphatic: Aortic atherosclerosis without aneurysm. No abdominopelvic adenopathy. Reproductive: Prostate gland appears enlarged. Other: No significant free fluid. No fluid collections. No signs of pneumoperitoneum. Musculoskeletal: Status post left hip arthroplasty. Status post PLIF of L3 through L5. No acute or suspicious osseous findings.  IMPRESSION: 1. Examination is positive for acute duodenitis. No signs of perforation or abscess. 2. Large hiatal hernia. 3. Colonic diverticulosis without signs of acute diverticulitis. 4. Aortic atherosclerosis. Aortic Atherosclerosis (ICD10-I70.0). Electronically Signed   By: Kerby Moors M.D.   On: 12/15/2020 11:38    Impression: Acute duodenitis on CT, melena, anemia -Hemoglobin 8.3, decreased from 10.6 on 11/27/2020 -Normal BUN (23) with creatinine 1.26 -Recent ibuprofen use (800 mg twice daily for several weeks)  A. fib, on Eliquis, last dose 7/13  Recent right total knee arthroplasty  CKD  Plan: EGD tomorrow for further evaluation.  I thoroughly discussed the procedure with the patient to include nature, alternatives, benefits, and risks (including but not limited to bleeding, infection, perforation, anesthesia/cardiac and pulmonary complications).  Patient verbalized understanding and gave verbal consent to proceed with EGD.  Start Protonix IV drip.  Clear liquid diet, NPO after midnight.  Continue to monitor H&H with transfusion as needed to maintain Hgb > 7-8.  [facility] will follow.   LOS: 0 days   Salley Slaughter  PA-C 12/15/2020, 12:11 PM  Contact #  [phone]

## 2020-12-15 NOTE — ED Triage Notes (Addendum)
Patient's son reports that the patient's wife died 4 months ago. Patient is not eating. Patient went to see his PCP yesterday and states"he was shutting down mentally."  Patient also had a right knee replacement on 11/21/20. Patient's son reports that the patient was doing well with the surgery, "but with anxiety and depression, things seem to be spiraling down hill ."  Patient had Tylenol 2 tabs approx 30 minutes ago for a headache.  Patient added that he was suicidal withno plan. Patient's son reports that the patient told his PCP yesterday that he had "given up."

## 2020-12-15 NOTE — H&P (Addendum)
History and Physical    [name] DOB: 1940/09/25 DOA: 12/15/2020  PCP: Cari Caraway, MD  Patient coming from: Home.  Chief Complaint: Depression.  HPI: [name] is a 80 y.o. male with history of A. fib, hypertension who has had a knee surgery last month was brought to the ER after patient was having depressive spells with poor oral intake and possible placement.  While in the ER patient stated that patient had a dark bowel stool which was loose last night and has been having epigastric pain for the last few days.  Has been taking NSAIDs for knee pain since he had a surgery for the knee last month.  ED Course: In the ER patient is hemodynamically stable but hemoglobin is significantly fallen from June 14 last month of 16 then it was 7 on June 27 and now it is 8.3.  Stool for occult blood is positive and melanotic.  Given the symptoms and significant drop in hemoglobin 1 unit of PRBC was transfused patient had a CT abdomen pelvis which shows features concerning for duodenitis and gastroenterology was consulted plan is to have EGD patient was started on Protonix.  Patient's last dose of Eliquis was 2 days ago.  COVID test was negative.  Review of Systems: As per HPI, rest all negative.   Past Medical History:  Diagnosis Date   Anginal pain ([place])    NONE IN 3 YEARS   Arthritis    Atrial fibrillation ([place]) 1962   a. No recurrence since 1962.   Chest pain    a. 1998 Cath: nl cors;  b. 2009 Cath: nl cors.   Depression    Dysrhythmia    NO TROUBLE IN 1 YR    DR. PETER [place]ique    Esophageal reflux    Essential hypertension    History of Paroxysmal atrial flutter ([facility])    Hyperlipidemia    Incontinence of urine    Leg pain, bilateral    Nocturia    PONV (postoperative nausea and vomiting)    CONSTIPATED   Scarlet fever 1945   Spinal stenosis    Staph skin infection    LEFT GROIN  04/11/16  TX W/ DOXYCYCLINE   Ulcerative proctitis ([place])     Past Surgical  History:  Procedure Laterality Date   ANKLE RECONSTRUCTION Right 2007   [place]   ANTERIOR LAT LUMBAR FUSION Right 04/18/2016   Procedure: RIGHT LUMBAR THREE-FOUR, LUMBAR FOUR-FIVE ANTEROLATERAL LUMBAR INTERBODY FUSION;  Surgeon: Erline Levine, MD;  Location: [facility];  Service: Neurosurgery;  Laterality: Right;  RIGHT L3-4 L4-5 ANTEROLATERAL LUMBAR INTERBODY FUSION   CARDIAC CATHETERIZATION  03/03/2008   Left heart cardiac catheterization and coronary   CARDIAC CATHETERIZATION  1997   Dr Wynonia Lawman   CATARACT EXTRACTION W/ INTRAOCULAR LENS  IMPLANT, BILATERAL     2016   ESOPHAGEAL DILATION     2008   INNER EAR SURGERY     EAR INJECTION FOR MENIERES   KNEE ARTHROSCOPY  1991, 1995   LUMBAR LAMINECTOMY  12/2010   LUMBAR LAMINECTOMY/DECOMPRESSION MICRODISCECTOMY Left 08/15/2016   Procedure: Left Left three- four Redo laminectomy;  Surgeon: Erline Levine, MD;  Location: [facility];  Service: Neurosurgery;  Laterality: Left;  Left L3-4 Redo laminectomy   LUMBAR PERCUTANEOUS PEDICLE SCREW 2 LEVEL N/A 04/18/2016   Procedure: LUMBAR THREE-FOUR, LUMBAR FOUR-FIVE PERCUTANEOUS PEDICLE SCREW;  Surgeon: Erline Levine, MD;  Location: [facility];  Service: Neurosurgery;  Laterality: N/A;   TOTAL HIP ARTHROPLASTY  Left 04/22/2017   TOTAL HIP ARTHROPLASTY Left 04/22/2017   Procedure: TOTAL HIP ARTHROPLASTY ANTERIOR APPROACH;  Surgeon: Melrose Nakayama, MD;  Location: [facility];  Service: Orthopedics;  Laterality: Left;   TOTAL KNEE ARTHROPLASTY Right 11/21/2020   Procedure: RIGHT TOTAL KNEE ARTHROPLASTY;  Surgeon: Melrose Nakayama, MD;  Location: [facility];  Service: Orthopedics;  Laterality: Right;     reports that he has been smoking pipe. He has never used smokeless tobacco. He reports current alcohol use of about 1.0 standard drink of alcohol per week. He reports that he does not use drugs.  Allergies  Allergen Reactions   Compazine [Prochlorperazine Edisylate] Other (See Comments)    EXTRAPYRAMIDAL MOVEMENT [Involuntary muscle  movement]   Penicillins Nausea And Vomiting, Rash and Other (See Comments)    Has patient had a PCN reaction causing immediate rash, facial/tongue/throat swelling, SOB or lightheadedness with hypotension: #  #  #  YES  #  #  #  Has patient had a PCN reaction causing severe rash involving mucus membranes or skin necrosis: No Has patient had a PCN reaction that required hospitalization No Has patient had a PCN reaction occurring within the last 10 years: No If all of the above answers are "NO", then may proceed with Cephalosporin use.    Welchol [Colesevelam Hcl] Other (See Comments)    MYALGIAS [Leg aches]   Gemfibrozil Nausea Only    PATIENT TOLERATES   Red Yeast Rice [Cholestin] Other (See Comments)    flushing    Family History  Problem Relation Age of Onset   Prostate cancer Father    Heart attack Brother    Kidney cancer Brother    Brain cancer Brother     Prior to Admission medications   Medication Sig Start Date End Date Taking? Authorizing Provider  ARTIFICIAL TEAR OP Place 1 drop into both eyes daily as needed (dry eyes).   Yes [provider]  carvedilol (COREG) 3.125 MG tablet Take 3.125 mg by mouth at bedtime.    Yes [provider]  clonazePAM (KLONOPIN) 0.5 MG tablet take 1/2-1 TABLET BY MOUTH AT BEDTIME AS NEEDED Patient taking differently: Take 0.25-0.5 mg by mouth at bedtime as needed (sleep). 12/07/19  Yes Marcial Pacas, MD  DULoxetine (CYMBALTA) 60 MG capsule Take 1 capsule (60 mg total) by mouth daily. 04/17/20  Yes Suzzanne Cloud, NP  ELIQUIS 5 MG TABS tablet Take 1 tablet (5 mg total) by mouth 2 (two) times daily. 11/30/20  Yes Martinique, Peter M, MD  ezetimibe (ZETIA) 10 MG tablet Take 10 mg by mouth daily.   Yes [provider]  ferrous sulfate 324 (65 Fe) MG TBEC Take 324 mg by mouth daily.   Yes [provider]  HYDROcodone-acetaminophen (NORCO/VICODIN) 5-325 MG tablet Take 1-2 tablets by mouth every 6 (six) hours as needed for  moderate pain or severe pain (post op pain). 11/22/20  Yes Loni Dolly, PA-C  lisinopril (ZESTRIL) 5 MG tablet Take 5 mg by mouth daily.   Yes [provider]  methocarbamol (ROBAXIN) 750 MG tablet Take 1 tablet (750 mg total) by mouth every 6 (six) hours as needed for muscle spasms. 11/22/20  Yes Loni Dolly, PA-C  Multiple Vitamins-Minerals (MULTIVITAMIN PO) Take 1 tablet by mouth daily.   Yes [provider]  nitroGLYCERIN (NITROSTAT) 0.4 MG SL tablet Place 0.4 mg under the tongue every 5 (five) minutes as needed for chest pain.   Yes [provider]  omeprazole (PRILOSEC) 20 MG  capsule Take 20 mg by mouth daily.   Yes [provider]  polyethylene glycol (MIRALAX / GLYCOLAX) packet Take 17 g by mouth daily as needed for mild constipation.   Yes [provider]  pregabalin (LYRICA) 100 MG capsule TAKE THREE CAPSULES BY MOUTH EVERY EVENING Patient taking differently: Take 300 mg by mouth every evening. 09/05/20  Yes Suzzanne Cloud, NP  rOPINIRole (REQUIP) 0.5 MG tablet Take 1 tablet (0.5 mg total) by mouth at bedtime. 04/17/20  Yes Suzzanne Cloud, NP  rosuvastatin (CRESTOR) 10 MG tablet TAKE 1 TABLET ONCE DAILY OR AS DIRECTED. Patient taking differently: Take 10 mg by mouth every evening. 04/22/17  Yes Martinique, Peter M, MD  tamsulosin (FLOMAX) 0.4 MG CAPS capsule Take 0.4 mg by mouth daily. 12/08/18  Yes [provider]    Physical Exam: Constitutional: Moderately built and nourished. Vitals:   12/15/20 1140 12/15/20 1145 12/15/20 1200 12/15/20 1245  BP: 118/88 131/86 131/78 128/82  Pulse:   88   Resp: 17 15 15 12   Temp: 98.4 F (36.9 C)     TempSrc: Oral     SpO2:   100%   Weight:      Height:       Eyes: Anicteric no pallor. ENMT: No discharge from the ears eyes nose and mouth. Neck: No mass felt.  No neck rigidity. Respiratory: No rhonchi or crepitations. Cardiovascular: S1-S2 heard. Abdomen: Soft mild epigastric tenderness no  guarding or rigidity. Musculoskeletal: No edema. Skin: No rash. Neurologic: Alert awake oriented to time place and person.  Moves all extremities. Psychiatric: Appears normal.  Normal affect.   Labs on Admission: I have personally reviewed following labs and imaging studies  CBC: Recent Labs  Lab 12/15/20 0843  WBC 6.5  NEUTROABS 4.7  HGB 8.3*  HCT 25.5*  MCV 105.8*  PLT 256   Basic Metabolic Panel: Recent Labs  Lab 12/15/20 0843  NA 134*  K 3.9  CL 101  CO2 22  GLUCOSE 98  BUN 23  CREATININE 1.26*  CALCIUM 8.9   GFR: Estimated Creatinine Clearance: 55.9 mL/min (A) (by C-G formula based on SCr of 1.26 mg/dL (H)). Liver Function Tests: Recent Labs  Lab 12/15/20 0843  AST 35  ALT 21  ALKPHOS 60  BILITOT 0.9  PROT 6.1*  ALBUMIN 3.8   No results for input(s): LIPASE, AMYLASE in the last 168 hours. No results for input(s): AMMONIA in the last 168 hours. Coagulation Profile: No results for input(s): INR, PROTIME in the last 168 hours. Cardiac Enzymes: No results for input(s): CKTOTAL, CKMB, CKMBINDEX, TROPONINI in the last 168 hours. BNP (last 3 results) No results for input(s): PROBNP in the last 8760 hours. HbA1C: No results for input(s): HGBA1C in the last 72 hours. CBG: No results for input(s): GLUCAP in the last 168 hours. Lipid Profile: No results for input(s): CHOL, HDL, LDLCALC, TRIG, CHOLHDL, LDLDIRECT in the last 72 hours. Thyroid Function Tests: No results for input(s): TSH, T4TOTAL, FREET4, T3FREE, THYROIDAB in the last 72 hours. Anemia Panel: No results for input(s): VITAMINB12, FOLATE, FERRITIN, TIBC, IRON, RETICCTPCT in the last 72 hours. Urine analysis:    Component Value Date/Time   COLORURINE YELLOW 12/15/2020 0827   APPEARANCEUR CLEAR 12/15/2020 0827   LABSPEC 1.015 12/15/2020 0827   PHURINE 5.0 12/15/2020 0827   GLUCOSEU NEGATIVE 12/15/2020 0827   HGBUR NEGATIVE 12/15/2020 0827   BILIRUBINUR NEGATIVE 12/15/2020 0827   KETONESUR 5  (A) 12/15/2020 0827   PROTEINUR NEGATIVE  12/15/2020 0827   UROBILINOGEN 0.2 03/02/2008 1622   NITRITE NEGATIVE 12/15/2020 0827   LEUKOCYTESUR NEGATIVE 12/15/2020 0827   Sepsis Labs: @LABRCNTIP (procalcitonin:4,lacticidven:4) ) Recent Results (from the past 240 hour(s))  Resp Panel by RT-PCR (Flu A&B, Covid) Nasopharyngeal Swab     Status: None   Collection Time: 12/15/20 11:45 AM   Specimen: Nasopharyngeal Swab; Nasopharyngeal(NP) swabs in vial transport medium  Result Value Ref Range Status   SARS Coronavirus 2 by RT PCR NEGATIVE NEGATIVE Final    Comment: (NOTE) SARS-CoV-2 target nucleic acids are NOT DETECTED.  The SARS-CoV-2 RNA is generally detectable in upper respiratory specimens during the acute phase of infection. The lowest concentration of SARS-CoV-2 viral copies this assay can detect is 138 copies/mL. A negative result does not preclude SARS-Cov-2 infection and should not be used as the sole basis for treatment or other patient management decisions. A negative result may occur with  improper specimen collection/handling, submission of specimen other than nasopharyngeal swab, presence of viral mutation(s) within the areas targeted by this assay, and inadequate number of viral copies(<138 copies/mL). A negative result must be combined with clinical observations, patient history, and epidemiological information. The expected result is Negative.  Fact Sheet for Patients:  EntrepreneurPulse.com.au  Fact Sheet for Healthcare Providers:  IncredibleEmployment.be  This test is no t yet approved or cleared by the [place] FDA and  has been authorized for detection and/or diagnosis of SARS-CoV-2 by FDA under an Emergency Use Authorization (EUA). This EUA will remain  in effect (meaning this test can be used) for the duration of the COVID-19 declaration under Section 564(b)(1) of the Act, 21 U.S.C.section 360bbb-3(b)(1), unless the  authorization is terminated  or revoked sooner.       Influenza A by PCR NEGATIVE NEGATIVE Final   Influenza B by PCR NEGATIVE NEGATIVE Final    Comment: (NOTE) The Xpert Xpress SARS-CoV-2/FLU/RSV plus assay is intended as an aid in the diagnosis of influenza from Nasopharyngeal swab specimens and should not be used as a sole basis for treatment. Nasal washings and aspirates are unacceptable for Xpert Xpress SARS-CoV-2/FLU/RSV testing.  Fact Sheet for Patients: EntrepreneurPulse.com.au  Fact Sheet for Healthcare Providers: IncredibleEmployment.be  This test is not yet approved or cleared by the [place] FDA and has been authorized for detection and/or diagnosis of SARS-CoV-2 by FDA under an Emergency Use Authorization (EUA). This EUA will remain in effect (meaning this test can be used) for the duration of the COVID-19 declaration under Section 564(b)(1) of the Act, 21 U.S.C. section 360bbb-3(b)(1), unless the authorization is terminated or revoked.  Performed at [facility], [place] [address], [place], [place] [address]      Radiological Exams on Admission: CT Abdomen Pelvis W Contrast  Result Date: 12/15/2020 CLINICAL DATA:  Abdominal pain. EXAM: CT ABDOMEN AND PELVIS WITH CONTRAST TECHNIQUE: Multidetector CT imaging of the abdomen and pelvis was performed using the standard protocol following bolus administration of intravenous contrast. CONTRAST:  79mL OMNIPAQUE IOHEXOL 350 MG/ML SOLN COMPARISON:  05/29/2019 FINDINGS: Lower chest: No acute abnormality. Hepatobiliary: No focal liver abnormality is seen. No gallstones, gallbladder wall thickening, or biliary dilatation. Pancreas: Unremarkable. No pancreatic ductal dilatation or surrounding inflammatory changes. Spleen: Normal in size without focal abnormality. Adrenals/Urinary Tract: Normal adrenal glands. Bilateral lower pole kidney cysts are identified which measure up  to 2.5 cm. No kidney stones or signs of hydronephrosis identified bilaterally. Bladder unremarkable. Stomach/Bowel: There is a large hiatal hernia. Wall thickening, mucosal enhancement, and surrounding  inflammatory fat stranding is noted involving the second portion of the duodenum. No signs of perforation. The remaining small bowel loops are within normal limits. Diffuse colonic diverticulosis noted without signs of acute diverticulitis. Vascular/Lymphatic: Aortic atherosclerosis without aneurysm. No abdominopelvic adenopathy. Reproductive: Prostate gland appears enlarged. Other: No significant free fluid. No fluid collections. No signs of pneumoperitoneum. Musculoskeletal: Status post left hip arthroplasty. Status post PLIF of L3 through L5. No acute or suspicious osseous findings. IMPRESSION: 1. Examination is positive for acute duodenitis. No signs of perforation or abscess. 2. Large hiatal hernia. 3. Colonic diverticulosis without signs of acute diverticulitis. 4. Aortic atherosclerosis. Aortic Atherosclerosis (ICD10-I70.0). Electronically Signed   By: Kerby Moors M.D.   On: 12/15/2020 11:38     Assessment/Plan Principal Problem:   Acute GI bleeding Active Problems:   Essential hypertension    Acute GI bleeding in the setting of Eliquis and use of NSAIDs likely caused by NSAID use.  CT scan showing duodenitis at this time patient is being placed on Protonix infusion gastroenterology has been consulted planning for EGD tomorrow morning.  N.p.o. past midnight until then clear liquid diet. Acute blood loss anemia follow CBC did receive 1 unit of PRBC.  Patient does have macrocytic picture will need anemia panel with next blood draw. History of A. fib presently rate controlled.  Uses Coreg.  Apixaban on hold due to GI bleed.  Patient agreeable. History of hypertension presently hemodynamically stable closely monitor. Depression -Per behavioral health assessment patient is high risk for suicide  placed on suicide precaution further recommendation per psychiatry. Recent right knee surgery last month.   DVT prophylaxis: SCDs. Code Status: Full code. Family Communication: Discussed with patient. Disposition Plan: To be determined. Consults called: Gastroenterologist. Admission status: Observation.   Rise Patience MD [facility] Pager [phone].  If 7PM-7AM, please contact night-coverage [email or web address] Password [email or web address]  12/15/2020, 1:40 PM

## 2020-12-15 NOTE — BH Assessment (Signed)
Comprehensive Clinical Assessment (CCA) Note  12/15/2020 [name] [id number] DISPOSITION: Leevy-Johnson NP recommends a inpatient admission to assist with stabilization (Geropsychiatry)    [place] ED from 12/15/2020 in [place] DEPT ED from 11/27/2020 in [place] Admission (Discharged) from 11/21/2020 in [place] High Risk No Risk No Risk      The patient demonstrates the following risk factors for suicide: Chronic risk factors for suicide include: psychiatric disorder of depression . Acute risk factors for suicide include:  loss . Protective factors for this patient include: positive social support. Considering these factors, the overall suicide risk at this point appears to be high. Patient is not appropriate for outpatient follow up.   Patient is a 80 year old male that presents this date with ongoing S/I although denies any immediate plan or intent. Patient denies any H/I or AVH. Patient states that he recently lost his wife of 35 years four months ago and since then has been suffering with depression and anxiety. Patient states he recently went to his PCP and told them that they were "going downhill quick and can't go on." Patient states he was diagnosed with depression by his PCP and is prescribed medications for symptom management although patient states he feels they are not working as indicated. See MAR. Patient reports current symptoms to include feeling lost, hopeless, excessive fatigue and isolating. Patient denies any previous attempts or gestures to self harm and states this date that he feels "like giving up." Patient also reports 3 hours or less of sleep a night over the last week. Patient states he currently resides alone although is supported by his adult son who "checks on him everyday." Patient denies any SA issues or previous inpatient admissions associated with  mental health. Patient cannot contract for safety and is requesting an admission because he "needs to get this depression under control."   Andrew Miller PA writes on admission: 80 year old male brought in by son from home with request for behavioral health admission.  Patient denies chest pain, abdominal pain, shortness of breath or other acute concerns, history provided largely by patient's son at bedside who reports patient's wife died 4 months ago, has had somewhat of a decline since that time however has "shut down" this past week, is not eating.  Seen by PCP yesterday for same who recommended admission to [facility].  Patient is prescribed medications for depression, unsure if he is taking them.  Today voiced suicidal thoughts to side without plan.   Upon further discussion, patient states that he is feeling generally weak today which she had additional attributed to his loss of appetite, he agrees that he is pale, has mild generalized abdominal discomfort and had a loose stool today that was black.  Patient is on Eliquis for A. fib, last colonoscopy was in 2017, unsure who did the colonoscopy, states he was told he would not need another one.  Is agreeable to a blood transfusion if needed.  Patient is oriented x 5. Patient presents with a depressed affect with mood congruent. Patient's memory is intact with thoughts organized. Patient does not appear to be responding to internal stimuli.       Chief Complaint:  Chief Complaint  Patient presents with   Failure To Thrive   Weakness   Suicidal   Visit Diagnosis: MDD recurrent without psychotic features, severe    CCA Screening, Triage and Referral (STR)  Patient Reported  Information How did you hear about [place]? Self  What Is the Reason for Your Visit/Call Today? Ongoing S/I  How Long Has This Been Causing You Problems? No data recorded What Do You Feel Would Help You the Most Today? Treatment for Depression or other mood problem   Have You  Recently Had Any Thoughts About Hurting Yourself? Yes  Are You Planning to Commit Suicide/Harm Yourself At This time? No   Have you Recently Had Thoughts About [place]? No  Are You Planning to Harm Someone at This Time? No  Explanation: No data recorded  Have You Used Any Alcohol or Drugs in the Past 24 Hours? No  How Long Ago Did You Use Drugs or Alcohol? No data recorded What Did You Use and How Much? No data recorded  Do You Currently Have a Therapist/Psychiatrist? No  Name of Therapist/Psychiatrist: No data recorded  Have You Been Recently Discharged From Any Office Practice or Programs? No  Explanation of Discharge From Practice/Program: No data recorded    CCA Screening Triage Referral Assessment Type of Contact: Face-to-Face  Telemedicine Service Delivery:   Is this Initial or Reassessment? No data recorded Date Telepsych consult ordered in [facility]:  No data recorded Time Telepsych consult ordered in [facility]:  No data recorded Location of Assessment: [facility]  Provider Location: Other (comment) ([facility])   Collateral Involvement: None at this time   Does Patient Have a [place]? No data recorded Name and Contact of Legal Guardian: No data recorded If Minor and Not Living with Parent(s), Who has Custody? NA  Is CPS involved or ever been involved? Never  Is APS involved or ever been involved? Never   Patient Determined To Be At Risk for Harm To Self or Others Based on Review of Patient Reported Information or Presenting Complaint? Yes, for Self-Harm  Method: No data recorded Availability of Means: No data recorded Intent: No data recorded Notification Required: No data recorded Additional Information for Danger to Others Potential: No data recorded Additional Comments for Danger to Others Potential: No data recorded Are There Guns or Other Weapons in Your Home? No data recorded Types of Guns/Weapons: No data recorded Are These  Weapons Safely Secured?                            No data recorded Who Could Verify You Are Able To Have These Secured: No data recorded Do You Have any Outstanding Charges, Pending Court Dates, Parole/Probation? No data recorded Contacted To Inform of Risk of Harm To Self or Others: Other: Comment (NA)    Does Patient Present under Involuntary Commitment? No  IVC Papers Initial File Date: No data recorded  [place] of Residence: [place]   Patient Currently Receiving the Following Services: Not Receiving Services   Determination of Need: Emergent (2 hours)   Options For Referral: Outpatient Therapy     CCA Biopsychosocial Patient Reported Schizophrenia/Schizoaffective Diagnosis in Past: No   Strengths: Pt is willing to participate in treatment   Mental Health Symptoms Depression:   Change in energy/activity; Difficulty Concentrating; Fatigue   Duration of Depressive symptoms:  Duration of Depressive Symptoms: Greater than two weeks   Mania:   None   Anxiety:    Difficulty concentrating   Psychosis:   None   Duration of Psychotic symptoms:    Trauma:   None   Obsessions:   None   Compulsions:   None  Inattention:   None   Hyperactivity/Impulsivity:   None   Oppositional/Defiant Behaviors:   None   Emotional Irregularity:   Chronic feelings of emptiness; Mood lability   Other Mood/Personality Symptoms:   NA    Mental Status Exam Appearance and self-care  Stature:   Average   Weight:   Average weight   Clothing:   Neat/clean   Grooming:   Normal   Cosmetic use:   None   Posture/gait:   Normal   Motor activity:   Not Remarkable   Sensorium  Attention:   Normal   Concentration:   Normal   Orientation:   X5   Recall/memory:   Normal   Affect and Mood  Affect:   Anxious   Mood:   Depressed; Anxious   Relating  Eye contact:   None   Facial expression:   Depressed   Attitude toward examiner:    Cooperative   Thought and Language  Speech flow:  Normal   Thought content:   Appropriate to Mood and Circumstances   Preoccupation:   None   Hallucinations:   None   Organization:  No data recorded  [employer] of Knowledge:   Average   Intelligence:   Average   Abstraction:   Normal   Judgement:   Good   Reality Testing:   Realistic   Insight:   Good   Decision Making:   Normal   Social Functioning  Social Maturity:   Responsible   Social Judgement:   Normal   Stress  Stressors:   Grief/losses   Coping Ability:   Programme researcher, broadcasting/film/video Deficits:   None   Supports:   Usual     Religion: Religion/Spirituality Are You A Religious Person?: Yes (Pt states spiritual) How Might This Affect Treatment?: NA  Leisure/Recreation: Leisure / Recreation Do You Have Hobbies?: No  Exercise/Diet: Exercise/Diet Do You Exercise?: No Have You Gained or Lost A Significant Amount of Weight in the Past Six Months?: No Do You Follow a Special Diet?: No Do You Have Any Trouble Sleeping?: Yes Explanation of Sleeping Difficulties: pt reporting 3 hours of sleep or less a night for the past week   CCA Employment/Education Employment/Work Situation: Employment / Work Situation Employment Situation: Retired  Education: Education Did You Have An Games developer (IIEP): No Did You Have Any Difficulty At [employer]?: No Patient's Education Has Been Impacted by Current Illness: No   CCA Family/Childhood History Family and Relationship History: Family history Marital status: Widowed Widowed, when?: 3 months Does patient have children?: Yes How many children?: 2 How is patient's relationship with their children?: Pt reports a good relationship  Childhood History:  Childhood History Did patient suffer any verbal/emotional/physical/sexual abuse as a child?: No Did patient suffer from severe childhood neglect?: No Has patient  ever been sexually abused/assaulted/raped as an adolescent or adult?: No Was the patient ever a victim of a crime or a disaster?: No Witnessed domestic violence?: No Has patient been affected by domestic violence as an adult?: No  Child/Adolescent Assessment:     CCA Substance Use Alcohol/Drug Use:                           ASAM's:  Six Dimensions of Multidimensional Assessment  Dimension 1:  Acute Intoxication and/or Withdrawal Potential:      Dimension 2:  Biomedical Conditions and Complications:      Dimension 3:  Emotional, Behavioral,  or Cognitive Conditions and Complications:     Dimension 4:  Readiness to Change:     Dimension 5:  Relapse, Continued use, or Continued Problem Potential:     Dimension 6:  Recovery/Living Environment:     ASAM Severity Score:    ASAM Recommended Level of Treatment:     Substance use Disorder (SUD)    Recommendations for Services/Supports/Treatments:    Discharge Disposition:    DSM5 Diagnoses: Patient Active Problem List   Diagnosis Date Noted   Acute GI bleeding 12/15/2020   Primary localized osteoarthritis of right knee 11/21/2020   Primary osteoarthritis of right knee 11/21/2020   Benign prostatic hyperplasia with lower urinary tract symptoms 11/06/2020   Low back pain 08/10/2019   Gait abnormality 08/10/2019   Paresthesia 08/10/2018   Persistent atrial fibrillation ([place]) 07/07/2018   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/22/2017   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   Spinal stenosis, lumbar region, with neurogenic claudication 04/18/2016   Essential hypertension    Restless leg syndrome, uncontrolled 08/31/2014   Restless leg syndrome 04/19/2014   Leg pain, bilateral    Normal coronary arteries 02/01/2014   CKD (chronic kidney disease), stage III ([place]) 07/05/2013   Dysphagia 07/05/2013   Esophageal reflux 07/05/2013   Hearing difficulty 07/05/2013   Hypertension 07/05/2013   Impaired  fasting glucose 07/05/2013   Insomnia 07/05/2013   Spinal stenosis 07/05/2013   Major depressive disorder, recurrent episode ([place]) 07/05/2013   Osteoarthrosis, unspecified whether generalized or localized, unspecified site 07/05/2013   Tachyarrhythmia 07/05/2013   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

## 2020-12-15 NOTE — ED Provider Notes (Signed)
[facility] DEPT Provider Note   CSN: [id number] Arrival date & time: 12/15/20  0805     History Chief Complaint  Patient presents with   Failure To Thrive   Weakness   Suicidal    [name] is a 80 y.o. male.  80 year old male brought in by son from home with request for behavioral health admission.  Patient denies chest pain, abdominal pain, shortness of breath or other acute concerns, history provided largely by patient's son at bedside who reports patient's wife died 4 months ago, has had somewhat of a decline since that time however has "shut down" this past week, is not eating.  Seen by PCP yesterday for same who recommended admission to [facility].  Patient is prescribed medications for depression, unsure if he is taking them.  Today voiced suicidal thoughts to side without plan.  Upon further discussion, patient states that he is feeling generally weak today which she had additional attributed to his loss of appetite, he agrees that he is pale, has mild generalized abdominal discomfort and had a loose stool today that was black.  Patient is on Eliquis for A. fib, last colonoscopy was in 2017, unsure who did the colonoscopy, states he was told he would not need another one.  Is agreeable to a blood transfusion if needed.      Past Medical History:  Diagnosis Date   Anginal pain ([place])    NONE IN 3 YEARS   Arthritis    Atrial fibrillation ([place]) 1962   a. No recurrence since 1962.   Chest pain    a. 1998 Cath: nl cors;  b. 2009 Cath: nl cors.   Depression    Dysrhythmia    NO TROUBLE IN 1 YR    DR. PETER Martinique    Esophageal reflux    Essential hypertension    History of Paroxysmal atrial flutter (HCC)    Hyperlipidemia    Incontinence of urine    Leg pain, bilateral    Nocturia    PONV (postoperative nausea and vomiting)    CONSTIPATED   Scarlet fever 1945   Spinal stenosis    Staph skin infection    LEFT GROIN  04/11/16  TX W/  DOXYCYCLINE   Ulcerative proctitis ([place])     Patient Active Problem List   Diagnosis Date Noted   Acute GI bleeding 12/15/2020   Primary localized osteoarthritis of right knee 11/21/2020   Primary osteoarthritis of right knee 11/21/2020   Benign prostatic hyperplasia with lower urinary tract symptoms 11/06/2020   Low back pain 08/10/2019   Gait abnormality 08/10/2019   Paresthesia 08/10/2018   Persistent atrial fibrillation ([place]) 07/07/2018   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/22/2017   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   Spinal stenosis, lumbar region, with neurogenic claudication 04/18/2016   Essential hypertension    Restless leg syndrome, uncontrolled 08/31/2014   Restless leg syndrome 04/19/2014   Leg pain, bilateral    Normal coronary arteries 02/01/2014   CKD (chronic kidney disease), stage III ([place]) 07/05/2013   Dysphagia 07/05/2013   Esophageal reflux 07/05/2013   Hearing difficulty 07/05/2013   Hypertension 07/05/2013   Impaired fasting glucose 07/05/2013   Insomnia 07/05/2013   Spinal stenosis 07/05/2013   Major depressive disorder, recurrent episode ([place]) 07/05/2013   Osteoarthrosis, unspecified whether generalized or localized, unspecified site 07/05/2013   Tachyarrhythmia 07/05/2013   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[place]) 07/05/2013   Mixed hyperlipidemia 05/24/2013    Past  Surgical History:  Procedure Laterality Date   ANKLE RECONSTRUCTION Right 2007   [place]   ANTERIOR LAT LUMBAR FUSION Right 04/18/2016   Procedure: RIGHT LUMBAR THREE-FOUR, LUMBAR FOUR-FIVE ANTEROLATERAL LUMBAR INTERBODY FUSION;  Surgeon: Erline Levine, MD;  Location: [facility];  Service: Neurosurgery;  Laterality: Right;  RIGHT L3-4 L4-5 ANTEROLATERAL LUMBAR INTERBODY FUSION   CARDIAC CATHETERIZATION  03/03/2008   Left heart cardiac catheterization and coronary   CARDIAC CATHETERIZATION  1997   Dr Wynonia Lawman   CATARACT EXTRACTION W/ INTRAOCULAR LENS  IMPLANT, BILATERAL      2016   ESOPHAGEAL DILATION     2008   INNER EAR SURGERY     EAR INJECTION FOR MENIERES   KNEE ARTHROSCOPY  1991, 1995   LUMBAR LAMINECTOMY  12/2010   LUMBAR LAMINECTOMY/DECOMPRESSION MICRODISCECTOMY Left 08/15/2016   Procedure: Left Left three- four Redo laminectomy;  Surgeon: Erline Levine, MD;  Location: [facility];  Service: Neurosurgery;  Laterality: Left;  Left L3-4 Redo laminectomy   LUMBAR PERCUTANEOUS PEDICLE SCREW 2 LEVEL N/A 04/18/2016   Procedure: LUMBAR THREE-FOUR, LUMBAR FOUR-FIVE PERCUTANEOUS PEDICLE SCREW;  Surgeon: Erline Levine, MD;  Location: [facility];  Service: Neurosurgery;  Laterality: N/A;   TOTAL HIP ARTHROPLASTY Left 04/22/2017   TOTAL HIP ARTHROPLASTY Left 04/22/2017   Procedure: TOTAL HIP ARTHROPLASTY ANTERIOR APPROACH;  Surgeon: Melrose Nakayama, MD;  Location: [facility];  Service: Orthopedics;  Laterality: Left;   TOTAL KNEE ARTHROPLASTY Right 11/21/2020   Procedure: RIGHT TOTAL KNEE ARTHROPLASTY;  Surgeon: Melrose Nakayama, MD;  Location: [facility];  Service: Orthopedics;  Laterality: Right;       Family History  Problem Relation Age of Onset   Prostate cancer Father    Heart attack Brother    Kidney cancer Brother    Brain cancer Brother     Social History   Tobacco Use   Smoking status: Some Days    Types: Pipe   Smokeless tobacco: Never  Vaping Use   Vaping Use: Never used  Substance Use Topics   Alcohol use: Yes    Alcohol/week: 1.0 standard drink    Types: 1 Shots of liquor per week    Comment: occasionally   Drug use: No    Home Medications Prior to Admission medications   Medication Sig Start Date End Date Taking? Authorizing Provider  ARTIFICIAL TEAR OP Place 1 drop into both eyes daily as needed (dry eyes).    [provider]  carvedilol (COREG) 3.125 MG tablet Take 3.125 mg by mouth at bedtime.     [provider]  clonazePAM (KLONOPIN) 0.5 MG tablet take 1/2-1 TABLET BY MOUTH AT BEDTIME AS NEEDED Patient taking differently: Take  0.25-0.5 mg by mouth at bedtime as needed (sleep). 12/07/19   Marcial Pacas, MD  DULoxetine (CYMBALTA) 60 MG capsule Take 1 capsule (60 mg total) by mouth daily. 04/17/20   Suzzanne Cloud, NP  ELIQUIS 5 MG TABS tablet Take 1 tablet (5 mg total) by mouth 2 (two) times daily. 11/30/20   Martinique, Peter M, MD  ferrous sulfate 324 (65 Fe) MG TBEC Take 324 mg by mouth daily.    [provider]  HYDROcodone-acetaminophen (NORCO/VICODIN) 5-325 MG tablet Take 1-2 tablets by mouth every 6 (six) hours as needed for moderate pain or severe pain (post op pain). 11/22/20   Loni Dolly, PA-C  lisinopril (ZESTRIL) 5 MG tablet Take 5 mg by mouth at bedtime.    [provider]  methocarbamol (ROBAXIN) 750 MG tablet Take 1  tablet (750 mg total) by mouth every 6 (six) hours as needed for muscle spasms. 11/22/20   Loni Dolly, PA-C  Multiple Vitamins-Minerals (MULTIVITAMIN PO) Take 1 tablet by mouth daily.    [provider]  nitroGLYCERIN (NITROSTAT) 0.4 MG SL tablet Place 0.4 mg under the tongue every 5 (five) minutes as needed for chest pain.    [provider]  omeprazole (PRILOSEC) 20 MG capsule Take 20 mg by mouth daily.    [provider]  polyethylene glycol (MIRALAX / GLYCOLAX) packet Take 17 g by mouth daily as needed for mild constipation.    [provider]  pregabalin (LYRICA) 100 MG capsule TAKE THREE CAPSULES BY MOUTH EVERY EVENING Patient taking differently: Take 300 mg by mouth every evening. 09/05/20   Suzzanne Cloud, NP  rOPINIRole (REQUIP) 0.5 MG tablet Take 1 tablet (0.5 mg total) by mouth at bedtime. 04/17/20   Suzzanne Cloud, NP  rosuvastatin (CRESTOR) 10 MG tablet TAKE 1 TABLET ONCE DAILY OR AS DIRECTED. Patient taking differently: Take 10 mg by mouth every evening. 04/22/17   Martinique, Peter M, MD  tamsulosin (FLOMAX) 0.4 MG CAPS capsule Take 0.4 mg by mouth daily. 12/08/18   [provider]    Allergies    Compazine [prochlorperazine  edisylate], Penicillins, Welchol [colesevelam hcl], Gemfibrozil, and Red yeast rice [cholestin]  Review of Systems   Review of Systems  Unable to perform ROS: Psychiatric disorder  Constitutional:  Positive for appetite change and fatigue. Negative for fever and unexpected weight change.  Respiratory:  Negative for shortness of breath.   Cardiovascular:  Negative for chest pain.  Gastrointestinal:  Positive for abdominal pain and diarrhea.  Genitourinary:  Negative for dysuria.  Skin:  Negative for rash and wound.  Neurological:  Positive for weakness.  Psychiatric/Behavioral:  Positive for suicidal ideas.    Physical Exam Updated Vital Signs BP 131/78   Pulse 88   Temp 98.4 F (36.9 C) (Oral)   Resp 15   Ht 5\' 10"  (1.778 m)   Wt 108.9 kg   SpO2 100%   BMI 34.44 kg/m   Physical Exam Vitals and nursing note reviewed. Exam conducted with a chaperone present.  Constitutional:      General: He is not in acute distress.    Appearance: He is well-developed. He is not diaphoretic.  HENT:     Head: Normocephalic and atraumatic.  Cardiovascular:     Rate and Rhythm: Normal rate and regular rhythm.     Heart sounds: Normal heart sounds.  Pulmonary:     Effort: Pulmonary effort is normal.     Breath sounds: Normal breath sounds.  Abdominal:     Palpations: Abdomen is soft.     Tenderness: There is generalized abdominal tenderness.  Genitourinary:    Rectum: Guaiac result positive.     Comments: Stool is black and Hemoccult positive. Musculoskeletal:        General: No swelling, tenderness or deformity.  Skin:    General: Skin is warm and dry.     Coloration: Skin is pale.     Findings: No erythema or rash.  Neurological:     Mental Status: He is alert and oriented to person, place, and time.     Sensory: No sensory deficit.  Psychiatric:        Mood and Affect: Affect is flat.        Behavior: Behavior is withdrawn.        Thought Content: Thought  content is not  paranoid. Thought content includes suicidal ideation. Thought content does not include homicidal ideation. Thought content does not include suicidal plan.    ED Results / Procedures / Treatments   Labs (all labs ordered are listed, but only abnormal results are displayed) Labs Reviewed  COMPREHENSIVE METABOLIC PANEL - Abnormal; Notable for the following components:      Result Value   Sodium 134 (*)    Creatinine, Ser 1.26 (*)    Total Protein 6.1 (*)    GFR, Estimated 57 (*)    All other components within normal limits  CBC WITH DIFFERENTIAL/PLATELET - Abnormal; Notable for the following components:   RBC 2.41 (*)    Hemoglobin 8.3 (*)    HCT 25.5 (*)    MCV 105.8 (*)    MCH 34.4 (*)    RDW 16.2 (*)    All other components within normal limits  URINALYSIS, ROUTINE W REFLEX MICROSCOPIC - Abnormal; Notable for the following components:   Ketones, ur 5 (*)    All other components within normal limits  RAPID URINE DRUG SCREEN, HOSP PERFORMED - Abnormal; Notable for the following components:   Opiates POSITIVE (*)    All other components within normal limits  SALICYLATE LEVEL - Abnormal; Notable for the following components:   Salicylate Lvl <1.5 (*)    All other components within normal limits  POC OCCULT BLOOD, ED - Abnormal; Notable for the following components:   Fecal Occult Bld POSITIVE (*)    All other components within normal limits  RESP PANEL BY RT-PCR (FLU A&B, COVID) ARPGX2  ETHANOL  ACETAMINOPHEN LEVEL  TYPE AND SCREEN  PREPARE RBC (CROSSMATCH)    EKG None  Radiology CT Abdomen Pelvis W Contrast  Result Date: 12/15/2020 CLINICAL DATA:  Abdominal pain. EXAM: CT ABDOMEN AND PELVIS WITH CONTRAST TECHNIQUE: Multidetector CT imaging of the abdomen and pelvis was performed using the standard protocol following bolus administration of intravenous contrast. CONTRAST:  40mL OMNIPAQUE IOHEXOL 350 MG/ML SOLN COMPARISON:  05/29/2019 FINDINGS: Lower chest: No acute  abnormality. Hepatobiliary: No focal liver abnormality is seen. No gallstones, gallbladder wall thickening, or biliary dilatation. Pancreas: Unremarkable. No pancreatic ductal dilatation or surrounding inflammatory changes. Spleen: Normal in size without focal abnormality. Adrenals/Urinary Tract: Normal adrenal glands. Bilateral lower pole kidney cysts are identified which measure up to 2.5 cm. No kidney stones or signs of hydronephrosis identified bilaterally. Bladder unremarkable. Stomach/Bowel: There is a large hiatal hernia. Wall thickening, mucosal enhancement, and surrounding inflammatory fat stranding is noted involving the second portion of the duodenum. No signs of perforation. The remaining small bowel loops are within normal limits. Diffuse colonic diverticulosis noted without signs of acute diverticulitis. Vascular/Lymphatic: Aortic atherosclerosis without aneurysm. No abdominopelvic adenopathy. Reproductive: Prostate gland appears enlarged. Other: No significant free fluid. No fluid collections. No signs of pneumoperitoneum. Musculoskeletal: Status post left hip arthroplasty. Status post PLIF of L3 through L5. No acute or suspicious osseous findings. IMPRESSION: 1. Examination is positive for acute duodenitis. No signs of perforation or abscess. 2. Large hiatal hernia. 3. Colonic diverticulosis without signs of acute diverticulitis. 4. Aortic atherosclerosis. Aortic Atherosclerosis (ICD10-I70.0). Electronically Signed   By: Kerby Moors M.D.   On: 12/15/2020 11:38    Procedures .Critical Care  Date/Time: 12/15/2020 12:27 PM Performed by: Tacy Learn, PA-C Authorized by: Tacy Learn, PA-C   Critical care provider statement:    Critical care time (minutes):  45   Critical care was time spent personally by  me on the following activities:  Discussions with consultants, evaluation of patient's response to treatment, examination of patient, ordering and performing treatments and  interventions, ordering and review of laboratory studies, ordering and review of radiographic studies, pulse oximetry, re-evaluation of patient's condition, obtaining history from patient or surrogate and review of old charts   Medications Ordered in ED Medications  0.9 %  sodium chloride infusion (has no administration in time range)  morphine 4 MG/ML injection 4 mg (4 mg Intravenous Given 12/15/20 0928)  ondansetron (ZOFRAN) injection 4 mg (4 mg Intravenous Given 12/15/20 0927)  iohexol (OMNIPAQUE) 350 MG/ML injection 100 mL (80 mLs Intravenous Contrast Given 12/15/20 1043)    ED Course  I have reviewed the triage vital signs and the nursing notes.  Pertinent labs & imaging results that were available during my care of the patient were reviewed by me and considered in my medical decision making (see chart for details).  Clinical Course as of 12/15/20 1229  Fri Dec 16, 1956  3285 80 year old male brought in by son for [facility] psych admission for depression with SI today.  Patient is found to be pale, reports mild generalized abdominal pain, did have a loose dark stool today, is on Eliquis for a fib. He is found to have black formed stool which is hemoccult positive.  Vitals are stable. Will obtain CT abdomen/pelvis with plan to admit for symptomatic anemia.  Unable to find patient's colonoscopy on file or the provider who performed this 5 years ago.  Will consult GI after CT scan. [LM]  1009 CT shows duodenitis and diverticulosis without diverticulitis. GI paged for consult. Vitals remained stable. [LM]  1213 Case discussed with Dr. Alessandra Bevels on-call with [facility], request clear liquid diet, hospitalist to admit, GI will consult. Patient last had his Eliquis yesterday, did not take any of his morning meds today. [LM]  1226 Case discussed with Dr. Hal Hope with [facility] hospitalist service will consult for admission. [LM]  1227 Review of labs, CBC with hemoglobin of 8.3, hematocrit of 25.  Patient  had a right knee replacement 1 month ago, his preop hemoglobin was 16, postop 11, hemoglobin 10.62 weeks ago, now 8.3. Patient was given 1 unit packed red blood cells for symptomatic anemia. CMP with mild increase in creatinine to 1.26 today. [LM]    Clinical Course User Index [LM] Roque Lias   MDM Rules/Calculators/A&P                           Final Clinical Impression(s) / ED Diagnoses Final diagnoses:  Gastrointestinal hemorrhage, unspecified gastrointestinal hemorrhage type  Symptomatic anemia  Suicidal thoughts  Duodenitis  Diverticulosis    Rx / DC Orders ED Discharge Orders     None        Roque Lias 12/15/20 1229    Carmin Muskrat, MD 12/17/20 [phone]

## 2020-12-15 NOTE — H&P (View-Only) (Signed)
Referring Provider: ED Primary Care Physician:  Cari Caraway, MD Primary Gastroenterologist:  Dr. Watt Climes [facility] GI)  Reason for Consultation:  Acute duodenitis on CT, melena, anemia  HPI: [name] is a 80 y.o. male with past medical history of A fib (on Eliquis), HTN, and CKD presenting for consultation of acute duodenitis on CT, melena, and anemia.  Patient presented to the ED due to poor oral intake.  He states he has had poor intake since his wife passed 4 months ago.  However, last night, he started having black bowel movements.  He states he has had 2 large melenic stools with flecks of red blood.  Also reports some epigastric abdominal pain that started today.  He has some nausea but denies any vomiting.  He believes he has lost weight since his wife's passing.  He takes Eliquis, but states last dose was 2 days ago.  Had a recent knee replacement and has been taking ibuprofen 800 mg BID for the last several weelks.  He reports a colonoscopy in 2017 but cannot recall who completed it. Last colonoscopy at [facility] was in 2011 and was normal with the exception of diverticulosis.     Past Medical History:  Diagnosis Date   Anginal pain ([place])    NONE IN 3 YEARS   Arthritis    Atrial fibrillation ([place]) 1962   a. No recurrence since 1962.   Chest pain    a. 1998 Cath: nl cors;  b. 2009 Cath: nl cors.   Depression    Dysrhythmia    NO TROUBLE IN 1 YR    DR. PETER Martinique    Esophageal reflux    Essential hypertension    History of Paroxysmal atrial flutter (HCC)    Hyperlipidemia    Incontinence of urine    Leg pain, bilateral    Nocturia    PONV (postoperative nausea and vomiting)    CONSTIPATED   Scarlet fever 1945   Spinal stenosis    Staph skin infection    LEFT GROIN  04/11/16  TX W/ DOXYCYCLINE   Ulcerative proctitis ([place])     Past Surgical History:  Procedure Laterality Date   ANKLE RECONSTRUCTION Right 2007   [place]   ANTERIOR LAT LUMBAR FUSION Right  04/18/2016   Procedure: RIGHT LUMBAR THREE-FOUR, LUMBAR FOUR-FIVE ANTEROLATERAL LUMBAR INTERBODY FUSION;  Surgeon: Erline Levine, MD;  Location: [employer];  Service: Neurosurgery;  Laterality: Right;  RIGHT L3-4 L4-5 ANTEROLATERAL LUMBAR INTERBODY FUSION   CARDIAC CATHETERIZATION  03/03/2008   Left heart cardiac catheterization and coronary   CARDIAC CATHETERIZATION  1997   Dr Wynonia Lawman   CATARACT EXTRACTION W/ INTRAOCULAR LENS  IMPLANT, BILATERAL     2016   ESOPHAGEAL DILATION     2008   INNER EAR SURGERY     EAR INJECTION FOR MENIERES   KNEE ARTHROSCOPY  1991, 1995   LUMBAR LAMINECTOMY  12/2010   LUMBAR LAMINECTOMY/DECOMPRESSION MICRODISCECTOMY Left 08/15/2016   Procedure: Left Left three- four Redo laminectomy;  Surgeon: Erline Levine, MD;  Location: [place];  Service: Neurosurgery;  Laterality: Left;  Left L3-4 Redo laminectomy   LUMBAR PERCUTANEOUS PEDICLE SCREW 2 LEVEL N/A 04/18/2016   Procedure: LUMBAR THREE-FOUR, LUMBAR FOUR-FIVE PERCUTANEOUS PEDICLE SCREW;  Surgeon: Erline Levine, MD;  Location: [place];  Service: Neurosurgery;  Laterality: N/A;   TOTAL HIP ARTHROPLASTY Left 04/22/2017   TOTAL HIP ARTHROPLASTY Left 04/22/2017   Procedure: TOTAL HIP ARTHROPLASTY ANTERIOR APPROACH;  Surgeon: Melrose Nakayama, MD;  Location:  [place] [facility];  Service: Orthopedics;  Laterality: Left;   TOTAL KNEE ARTHROPLASTY Right 11/21/2020   Procedure: RIGHT TOTAL KNEE ARTHROPLASTY;  Surgeon: Mel[place] Nakayama, MD;  Location: [facility];  Service: Orthopedics;  Laterality: Right;    Prior to Admission medications   Medication Sig Start Date End Date Taking? Authorizing Provider  ARTIFICIAL TEAR OP Place 1 drop into both eyes daily as needed (dry eyes).    [provider]  carvedilol (COREG) 3.125 MG tablet Take 3.125 mg by mouth at bedtime.     [provider]  clonazePAM (KLONOPIN) 0.5 MG tablet take 1/2-1 TABLET BY MOUTH AT BEDTIME AS NEEDED Patient taking differently: Take 0.25-0.5 mg by mouth at bedtime as  needed (sleep). 12/07/19   Marcial Pacas, MD  DULoxetine (CYMBALTA) 60 MG capsule Take 1 capsule (60 mg total) by mouth daily. 04/17/20   Suzzanne Cloud, NP  ELIQUIS 5 MG TABS tablet Take 1 tablet (5 mg total) by mouth 2 (two) times daily. 11/30/20   Martinique, Peter M, MD  ferrous sulfate 324 (65 Fe) MG TBEC Take 324 mg by mouth daily.    [provider]  HYDROcodone-acetaminophen (NORCO/VICODIN) 5-325 MG tablet Take 1-2 tablets by mouth every 6 (six) hours as needed for moderate pain or severe pain (post op pain). 11/22/20   Loni Dolly, PA-C  lisinopril (ZESTRIL) 5 MG tablet Take 5 mg by mouth at bedtime.    [provider]  methocarbamol (ROBAXIN) 750 MG tablet Take 1 tablet (750 mg total) by mouth every 6 (six) hours as needed for muscle spasms. 11/22/20   Loni Dolly, PA-C  Multiple Vitamins-Minerals (MULTIVITAMIN PO) Take 1 tablet by mouth daily.    [provider]  nitroGLYCERIN (NITROSTAT) 0.4 MG SL tablet Place 0.4 mg under the tongue every 5 (five) minutes as needed for chest pain.    [provider]  omeprazole (PRILOSEC) 20 MG capsule Take 20 mg by mouth daily.    [provider]  polyethylene glycol (MIRALAX / GLYCOLAX) packet Take 17 g by mouth daily as needed for mild constipation.    [provider]  pregabalin (LYRICA) 100 MG capsule TAKE THREE CAPSULES BY MOUTH EVERY EVENING Patient taking differently: Take 300 mg by mouth every evening. 09/05/20   Suzzanne Cloud, NP  rOPINIRole (REQUIP) 0.5 MG tablet Take 1 tablet (0.5 mg total) by mouth at bedtime. 04/17/20   Suzzanne Cloud, NP  rosuvastatin (CRESTOR) 10 MG tablet TAKE 1 TABLET ONCE DAILY OR AS DIRECTED. Patient taking differently: Take 10 mg by mouth every evening. 04/22/17   Martinique, Peter M, MD  tamsulosin (FLOMAX) 0.4 MG CAPS capsule Take 0.4 mg by mouth daily. 12/08/18   [provider]    Scheduled Meds: Continuous Infusions:  sodium chloride     PRN  Meds:.  Allergies as of 12/15/2020 - Review Complete 12/15/2020  Allergen Reaction Noted   Compazine [prochlorperazine edisylate] Other (See Comments) 04/19/2014   Penicillins Nausea And Vomiting, Rash, and Other (See Comments) 07/05/2013   Welchol [colesevelam hcl] Other (See Comments) 10/19/2013   Gemfibrozil Nausea Only 07/05/2013   Red yeast rice [cholestin] Other (See Comments) 08/06/2016    Family History  Problem Relation Age of Onset   Prostate cancer Father    Heart attack Brother    Kidney cancer Brother    Brain cancer Brother     Social History   Socioeconomic History   Marital status: Widowed    Spouse name: Bethena Roys  Number of children: 3   Years of education: college   Highest education level: Not on file  Occupational History    Comment: Retired   Tobacco Use   Smoking status: Some Days    Types: Pipe   Smokeless tobacco: Never  Vaping Use   Vaping Use: Never used  Substance and Sexual Activity   Alcohol use: Yes    Alcohol/week: 1.0 standard drink    Types: 1 Shots of liquor per week    Comment: occasionally   Drug use: No   Sexual activity: Not on file  Other Topics Concern   Not on file  Social History Narrative   Patient is retired and lives at home with his wife Bethena Roys.    Education college.   Caffeine one cup daily.   Social Determinants of Health   Financial Resource Strain: Not on file  Food Insecurity: Not on file  Transportation Needs: Not on file  Physical Activity: Not on file  Stress: Not on file  Social Connections: Not on file  Intimate Partner Violence: Not on file    Review of Systems: Review of Systems  Constitutional:  Positive for malaise/fatigue and weight loss. Negative for chills and fever.  HENT:  Negative for hearing loss and tinnitus.   Eyes:  Negative for pain and redness.  Respiratory:  Negative for cough and shortness of breath.   Cardiovascular:  Negative for chest pain and palpitations.  Gastrointestinal:   Positive for abdominal pain, melena and nausea. Negative for blood in stool, constipation, diarrhea, heartburn and vomiting.  Genitourinary:  Negative for flank pain and hematuria.  Musculoskeletal:  Positive for joint pain. Negative for falls.  Skin:  Negative for itching and rash.  Neurological:  Negative for seizures and loss of consciousness.  Endo/Heme/Allergies:  Negative for polydipsia. Does not bruise/bleed easily.  Psychiatric/Behavioral:  Positive for depression. Negative for substance abuse.     Physical Exam: Vital signs: Vitals:   12/15/20 1145 12/15/20 1200  BP: 131/86 131/78  Pulse:  88  Resp: 15 15  Temp:    SpO2:  100%      Physical Exam Vitals reviewed.  Constitutional:      General: He is not in acute distress. HENT:     Head: Normocephalic and atraumatic.     Nose: Nose normal. No congestion.     Mouth/Throat:     Mouth: Mucous membranes are moist.     Pharynx: Oropharynx is clear.  Eyes:     Extraocular Movements: Extraocular movements intact.     Comments: Conjunctival pallor  Cardiovascular:     Rate and Rhythm: Normal rate. Rhythm irregular.  Pulmonary:     Effort: Pulmonary effort is normal. No respiratory distress.  Abdominal:     General: Bowel sounds are normal. There is no distension.     Palpations: Abdomen is soft. There is no mass.     Tenderness: There is abdominal tenderness (Mild epigastric). There is no guarding or rebound.     Hernia: No hernia is present.  Musculoskeletal:        General: No swelling or tenderness.     Cervical back: Normal range of motion and neck supple.  Skin:    General: Skin is warm and dry.  Neurological:     General: No focal deficit present.     Mental Status: He is oriented to person, place, and time. He is lethargic.  Psychiatric:        Mood and Affect: Mood  normal.        Behavior: Behavior normal. Behavior is cooperative.    GI:  Lab Results: Recent Labs    12/15/20 0843  WBC 6.5  HGB  8.3*  HCT 25.5*  PLT 278   BMET Recent Labs    12/15/20 0843  NA 134*  K 3.9  CL 101  CO2 22  GLUCOSE 98  BUN 23  CREATININE 1.26*  CALCIUM 8.9   LFT Recent Labs    12/15/20 0843  PROT 6.1*  ALBUMIN 3.8  AST 35  ALT 21  ALKPHOS 60  BILITOT 0.9   PT/INR No results for input(s): LABPROT, INR in the last 72 hours.   Studies/Results: CT Abdomen Pelvis W Contrast  Result Date: 12/15/2020 CLINICAL DATA:  Abdominal pain. EXAM: CT ABDOMEN AND PELVIS WITH CONTRAST TECHNIQUE: Multidetector CT imaging of the abdomen and pelvis was performed using the standard protocol following bolus administration of intravenous contrast. CONTRAST:  65mL OMNIPAQUE IOHEXOL 350 MG/ML SOLN COMPARISON:  05/29/2019 FINDINGS: Lower chest: No acute abnormality. Hepatobiliary: No focal liver abnormality is seen. No gallstones, gallbladder wall thickening, or biliary dilatation. Pancreas: Unremarkable. No pancreatic ductal dilatation or surrounding inflammatory changes. Spleen: Normal in size without focal abnormality. Adrenals/Urinary Tract: Normal adrenal glands. Bilateral lower pole kidney cysts are identified which measure up to 2.5 cm. No kidney stones or signs of hydronephrosis identified bilaterally. Bladder unremarkable. Stomach/Bowel: There is a large hiatal hernia. Wall thickening, mucosal enhancement, and surrounding inflammatory fat stranding is noted involving the second portion of the duodenum. No signs of perforation. The remaining small bowel loops are within normal limits. Diffuse colonic diverticulosis noted without signs of acute diverticulitis. Vascular/Lymphatic: Aortic atherosclerosis without aneurysm. No abdominopelvic adenopathy. Reproductive: Prostate gland appears enlarged. Other: No significant free fluid. No fluid collections. No signs of pneumoperitoneum. Musculoskeletal: Status post left hip arthroplasty. Status post PLIF of L3 through L5. No acute or suspicious osseous findings.  IMPRESSION: 1. Examination is positive for acute duodenitis. No signs of perforation or abscess. 2. Large hiatal hernia. 3. Colonic diverticulosis without signs of acute diverticulitis. 4. Aortic atherosclerosis. Aortic Atherosclerosis (ICD10-I70.0). Electronically Signed   By: Kerby Moors M.D.   On: 12/15/2020 11:38    Impression: Acute duodenitis on CT, melena, anemia -Hemoglobin 8.3, decreased from 10.6 on 11/27/2020 -Normal BUN (23) with creatinine 1.26 -Recent ibuprofen use (800 mg twice daily for several weeks)  A. fib, on Eliquis, last dose 7/13  Recent right total knee arthroplasty  CKD  Plan: EGD tomorrow for further evaluation.  I thoroughly discussed the procedure with the patient to include nature, alternatives, benefits, and risks (including but not limited to bleeding, infection, perforation, anesthesia/cardiac and pulmonary complications).  Patient verbalized understanding and gave verbal consent to proceed with EGD.  Start Protonix IV drip.  Clear liquid diet, NPO after midnight.  Continue to monitor H&H with transfusion as needed to maintain Hgb > 7-8.  [facility] will follow.   LOS: 0 days   Salley Slaughter  PA-C 12/15/2020, 12:11 PM  Contact #  [phone]

## 2020-12-15 NOTE — ED Notes (Signed)
Pt's son would like to have a SW consult if admitted to look into placement.  Pt's son also endorsed concerns about mental state and what the plan will be upon d/c.  Son stated pt's PCP had discussed the SNF in [place] as a possible placement option.

## 2020-12-15 NOTE — Consult Note (Signed)
[name] is a 80 year old male who presented to [facility] for assessment of depressive symptoms and failure to thrive. On assessment patient states he lost his wife of 59 years x3 months ago and "hasn't been doing too well since". States he lives alone and recently had a total knee replacement 11/21/20 which was "rough" on him. He presents alert; oriented to self and place only. States son is currently assisting him with his care in the home. He reports increased sleep, decreased interests, feelings of guilt and worthlessness, low energy, inconsistent concentration, poor appetite, denies any psychomotor changes, and suicidal ideations; he denies any plan. He endorses suicidal ideations; denies any homicidal ideations, auditory or visual hallucinations, and does not appear to be actively psychotic or responding to any external/internal stimuli at this time.   Collateral: Son " Bonito" (bedside) Reports father has been refusing to eat and "shutting down". States despite him having 24 hour care he remains in bed for long periods to the point his PCP was "alarmed" at his appearance this week. States EMS was called x2 weeks ago due to patient being "unresponsive"; reports increased anxiety and depression, decreased cognitive functioning. States patient told PCP that he is "giving up". 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 Denies any history of PTSD or psychiatric hospitalization. States patient has been placed on anti-depressant in the past.   Plan:   -Medical stabilization and clearance; provider notified patient was  being admitted for medical reasons.   -Patient to be re-evaluated for inpatient geriatric psychiatric  placement once medically cleared

## 2020-12-16 ENCOUNTER — Encounter (HOSPITAL_COMMUNITY): Payer: Self-pay | Admitting: Internal Medicine

## 2020-12-16 ENCOUNTER — Encounter (HOSPITAL_COMMUNITY)
Admission: EM | Disposition: A | Discharge status: Home / Self Care | Payer: Self-pay | Source: Home / Self Care | Attending: Emergency Medicine

## 2020-12-16 ENCOUNTER — Observation Stay (HOSPITAL_COMMUNITY): Payer: Medicare Other | Admitting: Anesthesiology

## 2020-12-16 DIAGNOSIS — K449 Diaphragmatic hernia without obstruction or gangrene: Secondary | ICD-10-CM | POA: Diagnosis not present

## 2020-12-16 DIAGNOSIS — K922 Gastrointestinal hemorrhage, unspecified: Secondary | ICD-10-CM | POA: Diagnosis not present

## 2020-12-16 DIAGNOSIS — Z7901 Long term (current) use of anticoagulants: Secondary | ICD-10-CM | POA: Diagnosis not present

## 2020-12-16 DIAGNOSIS — Z96642 Presence of left artificial hip joint: Secondary | ICD-10-CM | POA: Diagnosis not present

## 2020-12-16 DIAGNOSIS — D649 Anemia, unspecified: Secondary | ICD-10-CM | POA: Diagnosis not present

## 2020-12-16 DIAGNOSIS — K921 Melena: Secondary | ICD-10-CM | POA: Diagnosis not present

## 2020-12-16 DIAGNOSIS — I10 Essential (primary) hypertension: Secondary | ICD-10-CM | POA: Diagnosis not present

## 2020-12-16 DIAGNOSIS — F17290 Nicotine dependence, other tobacco product, uncomplicated: Secondary | ICD-10-CM | POA: Diagnosis not present

## 2020-12-16 DIAGNOSIS — I482 Chronic atrial fibrillation, unspecified: Secondary | ICD-10-CM | POA: Diagnosis not present

## 2020-12-16 DIAGNOSIS — K3189 Other diseases of stomach and duodenum: Secondary | ICD-10-CM | POA: Diagnosis not present

## 2020-12-16 DIAGNOSIS — K2980 Duodenitis without bleeding: Secondary | ICD-10-CM | POA: Diagnosis not present

## 2020-12-16 DIAGNOSIS — K269 Duodenal ulcer, unspecified as acute or chronic, without hemorrhage or perforation: Secondary | ICD-10-CM | POA: Diagnosis not present

## 2020-12-16 DIAGNOSIS — K5790 Diverticulosis of intestine, part unspecified, without perforation or abscess without bleeding: Secondary | ICD-10-CM | POA: Diagnosis not present

## 2020-12-16 DIAGNOSIS — D62 Acute posthemorrhagic anemia: Secondary | ICD-10-CM | POA: Diagnosis not present

## 2020-12-16 DIAGNOSIS — Z96651 Presence of right artificial knee joint: Secondary | ICD-10-CM | POA: Diagnosis not present

## 2020-12-16 DIAGNOSIS — E782 Mixed hyperlipidemia: Secondary | ICD-10-CM | POA: Diagnosis not present

## 2020-12-16 DIAGNOSIS — Z791 Long term (current) use of non-steroidal anti-inflammatories (NSAID): Secondary | ICD-10-CM | POA: Diagnosis not present

## 2020-12-16 DIAGNOSIS — K264 Chronic or unspecified duodenal ulcer with hemorrhage: Secondary | ICD-10-CM | POA: Diagnosis not present

## 2020-12-16 DIAGNOSIS — Z20822 Contact with and (suspected) exposure to covid-19: Secondary | ICD-10-CM | POA: Diagnosis not present

## 2020-12-16 DIAGNOSIS — Z79899 Other long term (current) drug therapy: Secondary | ICD-10-CM | POA: Diagnosis not present

## 2020-12-16 HISTORY — PX: ESOPHAGOGASTRODUODENOSCOPY: SHX5428

## 2020-12-16 HISTORY — PX: BIOPSY: SHX5522

## 2020-12-16 LAB — CBC
HCT: 28 % — ABNORMAL LOW (ref 39.0–52.0)
HCT: 28 % — ABNORMAL LOW (ref 39.0–52.0)
Hemoglobin: 8.9 g/dL — ABNORMAL LOW (ref 13.0–17.0)
Hemoglobin: 8.8 g/dL — ABNORMAL LOW (ref 13.0–17.0)
MCH: 33.8 pg (ref 26.0–34.0)
MCH: 33.2 pg (ref 26.0–34.0)
MCHC: 31.8 g/dL (ref 30.0–36.0)
MCHC: 31.4 g/dL (ref 30.0–36.0)
MCV: 106.5 fL — ABNORMAL HIGH (ref 80.0–100.0)
MCV: 105.7 fL — ABNORMAL HIGH (ref 80.0–100.0)
Platelets: 270 10*3/uL (ref 150–400)
Platelets: 265 10*3/uL (ref 150–400)
RBC: 2.63 MIL/uL — ABNORMAL LOW (ref 4.22–5.81)
RBC: 2.65 MIL/uL — ABNORMAL LOW (ref 4.22–5.81)
RDW: 17.4 % — ABNORMAL HIGH (ref 11.5–15.5)
RDW: 17.3 % — ABNORMAL HIGH (ref 11.5–15.5)
WBC: 6.1 10*3/uL (ref 4.0–10.5)
WBC: 6.3 10*3/uL (ref 4.0–10.5)
nRBC: 0 % (ref 0.0–0.2)
nRBC: 0 % (ref 0.0–0.2)

## 2020-12-16 LAB — GLUCOSE, CAPILLARY
Glucose-Capillary: 95 mg/dL (ref 70–99)
Glucose-Capillary: 110 mg/dL — ABNORMAL HIGH (ref 70–99)
Glucose-Capillary: 117 mg/dL — ABNORMAL HIGH (ref 70–99)

## 2020-12-16 SURGERY — EGD (ESOPHAGOGASTRODUODENOSCOPY)
Anesthesia: Monitor Anesthesia Care

## 2020-12-16 MED ORDER — PANTOPRAZOLE SODIUM 40 MG IV SOLR
40.0000 mg | Freq: Two times a day (BID) | INTRAVENOUS | Status: AC
  Administered 2020-12-16 – 2020-12-17 (×4): 40 mg via INTRAVENOUS
  Filled 2020-12-16 (×4): qty 40

## 2020-12-16 MED ORDER — DULOXETINE HCL 30 MG PO CPEP
60.0000 mg | ORAL_CAPSULE | Freq: Every day | ORAL | Status: DC
  Administered 2020-12-16 – 2020-12-18 (×3): 60 mg via ORAL
  Filled 2020-12-16 (×3): qty 2

## 2020-12-16 MED ORDER — SODIUM CHLORIDE 0.9 % IV SOLN: INTRAVENOUS | Status: DC

## 2020-12-16 MED ORDER — LIDOCAINE 2% (20 MG/ML) 5 ML SYRINGE
INTRAMUSCULAR | Status: DC | PRN
  Administered 2020-12-16: 100 mg via INTRAVENOUS

## 2020-12-16 MED ORDER — OXYCODONE HCL 5 MG PO TABS
5.0000 mg | ORAL_TABLET | ORAL | Status: DC | PRN
  Administered 2020-12-16 – 2020-12-17 (×3): 5 mg via ORAL
  Filled 2020-12-16 (×3): qty 1

## 2020-12-16 MED ORDER — CLONAZEPAM 0.125 MG PO TBDP
0.2500 mg | ORAL_TABLET | Freq: Every evening | ORAL | Status: DC | PRN
  Administered 2020-12-16 – 2020-12-17 (×2): 0.5 mg via ORAL
  Filled 2020-12-16 (×2): qty 4

## 2020-12-16 MED ORDER — TAMSULOSIN HCL 0.4 MG PO CAPS
0.4000 mg | ORAL_CAPSULE | Freq: Every day | ORAL | Status: DC
  Administered 2020-12-16 – 2020-12-18 (×3): 0.4 mg via ORAL
  Filled 2020-12-16 (×3): qty 1

## 2020-12-16 MED ORDER — PROPOFOL 500 MG/50ML IV EMUL
INTRAVENOUS | Status: AC
  Filled 2020-12-16: qty 50

## 2020-12-16 MED ORDER — TRAMADOL HCL 50 MG PO TABS
50.0000 mg | ORAL_TABLET | Freq: Four times a day (QID) | ORAL | Status: DC | PRN
Start: 2020-12-16 — End: 2020-12-18
  Administered 2020-12-17 (×2): 50 mg via ORAL
  Filled 2020-12-16 (×3): qty 1

## 2020-12-16 MED ORDER — LACTATED RINGERS IV SOLN: INTRAVENOUS | Status: DC

## 2020-12-16 MED ORDER — PREGABALIN 75 MG PO CAPS
300.0000 mg | ORAL_CAPSULE | Freq: Every evening | ORAL | Status: DC
  Administered 2020-12-16 – 2020-12-17 (×2): 300 mg via ORAL
  Filled 2020-12-16 (×2): qty 4

## 2020-12-16 MED ORDER — PROPOFOL 500 MG/50ML IV EMUL
INTRAVENOUS | Status: DC | PRN
  Administered 2020-12-16: 125 ug/kg/min via INTRAVENOUS

## 2020-12-16 MED ORDER — METHOCARBAMOL 500 MG PO TABS: 750.0000 mg | ORAL_TABLET | Freq: Four times a day (QID) | ORAL | Status: DC | PRN

## 2020-12-16 MED ORDER — POLYETHYLENE GLYCOL 3350 17 G PO PACK: 17.0000 g | PACK | Freq: Every day | ORAL | Status: DC | PRN

## 2020-12-16 MED ORDER — PROPOFOL 10 MG/ML IV BOLUS
INTRAVENOUS | Status: DC | PRN
  Administered 2020-12-16 (×2): 20 mg via INTRAVENOUS

## 2020-12-16 MED ORDER — EZETIMIBE 10 MG PO TABS
10.0000 mg | ORAL_TABLET | Freq: Every day | ORAL | Status: DC
  Administered 2020-12-16 – 2020-12-18 (×3): 10 mg via ORAL
  Filled 2020-12-16 (×3): qty 1

## 2020-12-16 MED ORDER — SODIUM CHLORIDE 0.9 % IV SOLN: 10.0000 mL/h | Freq: Once | INTRAVENOUS | Status: DC

## 2020-12-16 MED ORDER — CLONAZEPAM 0.5 MG PO TABS: 0.2500 mg | ORAL_TABLET | Freq: Every evening | ORAL | Status: DC | PRN

## 2020-12-16 MED ORDER — ROSUVASTATIN CALCIUM 10 MG PO TABS
10.0000 mg | ORAL_TABLET | Freq: Every evening | ORAL | Status: DC
  Administered 2020-12-16 – 2020-12-17 (×2): 10 mg via ORAL
  Filled 2020-12-16 (×2): qty 1

## 2020-12-16 NOTE — Transfer of Care (Signed)
Immediate Anesthesia Transfer of Care Note  Patient: [name]  Procedure(s) Performed: ESOPHAGOGASTRODUODENOSCOPY (EGD)  Patient Location: Endoscopy Unit  Anesthesia Type:MAC  Level of Consciousness: awake  Airway & Oxygen Therapy: Patient Spontanous Breathing and Patient connected to face mask oxygen  Post-op Assessment: Report given to RN and Post -op Vital signs reviewed and stable  Post vital signs: Reviewed and stable  Last Vitals:  Vitals Value Taken Time  BP    Temp    Pulse 109 12/16/20 1042  Resp    SpO2 100 % 12/16/20 1042  Vitals shown include unvalidated device data.  Last Pain:  Vitals:   12/16/20 0946  TempSrc: Oral  PainSc: 5       Patients Stated Pain Goal: 3 (01/15/47 1856)  Complications: No notable events documented.

## 2020-12-16 NOTE — Op Note (Signed)
[facility] Patient Name: [name] Procedure Date: 12/16/2020 MRN: [id number] Attending MD: Ronnette Juniper , MD Date of Birth: [birth year] CSN: [id number] Age: 80 Admit Type: Inpatient Procedure:                Upper GI endoscopy Indications:              Acute post hemorrhagic anemia, Melena, NSAID                            use,large hiatal hernia and dudoenitis noted on CT Providers:                Ronnette Juniper, MD, Althea Grimmer,                            Technician, Danley Danker, CRNA, Mariana Arn Referring MD:             [facility] Medicines:                Monitored Anesthesia Care Complications:            No immediate complications. Estimated blood loss:                            Minimal. Estimated Blood Loss:     Estimated blood loss was minimal. Procedure:                Pre-Anesthesia Assessment:                           - Prior to the procedure, a History and Physical                            was performed, and patient medications and                            allergies were reviewed. [name] tolerance of                            previous anesthesia was also reviewed. The risks                            and benefits of the procedure and the sedation                            options and risks were discussed with the patient.                            All questions were answered, and informed consent                            was obtained. Prior Anticoagulants: The patient has                            taken Eliquis (apixaban), last dose was 4 days  prior to procedure. ASA Grade Assessment: III - A                            patient with severe systemic disease. After                            reviewing the risks and benefits, the patient was                            deemed in satisfactory condition to undergo the                            procedure.                           After  obtaining informed consent, the endoscope was                            passed under direct vision. Throughout the                            procedure, the patient's blood pressure, pulse, and                            oxygen saturations were monitored continuously. The                            GIF-[id number] ([id number]) was introduced through the                            mouth, and advanced to the second part of duodenum.                            The upper GI endoscopy was accomplished without                            difficulty. The patient tolerated the procedure                            well. Scope In: Scope Out: Findings:      The examined esophagus was normal.      The Z-line was regular and was found 35 cm from the incisors.      A large hiatal hernia, over 10 cm in size was present.      Biopsies were taken with a cold forceps in the gastric antrum for       Helicobacter pylori testing.      Many non-bleeding superficial duodenal ulcers with a clean ulcer base       (Forrest Class III) were found in the duodenal bulb, in the first       portion of the duodenum and in the second portion of the duodenum. The       largest lesion was 15 mm in largest dimension. Biopsies were taken with       a cold forceps for histology. Impression:               -  Normal esophagus.                           - Z-line regular, 35 cm from the incisors.                           - Large hiatal hernia.                           - Non-bleeding duodenal ulcers with a clean ulcer                            base (Forrest Class III). Biopsied.                           - Biopsies were taken with a cold forceps for                            Helicobacter pylori testing. Moderate Sedation:      Patient did not receive moderate sedation for this procedure, but       instead received monitored anesthesia care. Recommendation:           - Patient has a contact number available for                             emergencies. The signs and symptoms of potential                            delayed complications were discussed with the                            patient. Return to normal activities tomorrow.                            Written discharge instructions were provided to the                            patient.                           - Resume regular diet.                           - Continue present medications.                           - Await pathology results.                           - Ok to resume Eliquis in am.                           - PPI BID for 2 months and if patient will be on                            eliquis, recommend PPI once a  day indefinitely                            thereafter. Procedure Code(s):        --- Professional ---                           [phone], Esophagogastroduodenoscopy, flexible,                            transoral; with biopsy, single or multiple Diagnosis Code(s):        --- Professional ---                           K44.9, Diaphragmatic hernia without obstruction or                            gangrene                           K26.9, Duodenal ulcer, unspecified as acute or                            chronic, without hemorrhage or perforation                           D62, Acute posthemorrhagic anemia                           K92.1, Melena (includes Hematochezia) CPT copyright 2019 American Medical Association. All rights reserved. The codes documented in this report are preliminary and upon coder review may  be revised to meet current compliance requirements. Ronnette Juniper, MD 12/16/2020 10:47:32 AM This report has been signed electronically. Number of Addenda: 0

## 2020-12-16 NOTE — Anesthesia Preprocedure Evaluation (Addendum)
Anesthesia Evaluation  Patient identified by MRN, date of birth, ID band Patient awake    Reviewed: Allergy & Precautions, NPO status , Patient's Chart, lab work & pertinent test results, reviewed documented beta blocker date and time   History of Anesthesia Complications (+) PONV  Airway Mallampati: II  TM Distance: >3 FB Neck ROM: Full    Dental no notable dental hx. (+) Teeth Intact, Dental Advisory Given   Pulmonary Current Smoker and Patient abstained from smoking.,    Pulmonary exam normal breath sounds clear to auscultation       Cardiovascular hypertension, Pt. on home beta blockers and Pt. on medications + angina Normal cardiovascular exam+ dysrhythmias (on eliquis) Atrial Fibrillation  Rhythm:Regular Rate:Normal  TTE 2020 1. The left ventricle has normal systolic function of 47-65%. The cavity  size was normal. There is no increased left ventricular wall thickness.  Left ventricular diastology could not be evaluated secondary to atrial  fibrillation.  2. The right ventricle has normal systolic function. The cavity was  mildly enlarged. There is no increase in right ventricular wall thickness.  3. Left atrial size was moderately dilated.  4. The mitral valve is normal in structure.  5. The tricuspid valve is normal in structure.  6. The aortic valve is normal in structure. Aortic valve regurgitation is  mild by color flow Doppler.  7. The pulmonic valve was normal in structure.  8. There is mild dilatation of the ascending aorta.   Neuro/Psych PSYCHIATRIC DISORDERS Depression negative neurological ROS     GI/Hepatic Neg liver ROS, PUD, GERD  Medicated,  Endo/Other  negative endocrine ROS  Renal/GU Renal InsufficiencyRenal disease (Cr 1.26, K 3.9)  negative genitourinary   Musculoskeletal  (+) Arthritis ,   Abdominal   Peds  Hematology  (+) Blood dyscrasia (Hgb 8.8), anemia ,   Anesthesia Other  Findings 80 y.o. male with history of A. fib, hypertension who presented to the ED with dark bowel stool. CT c/f duodenitis. Plan for EGD.   Reproductive/Obstetrics                          Anesthesia Physical Anesthesia Plan  ASA: 3  Anesthesia Plan: MAC   Post-op Pain Management:    Induction: Intravenous  PONV Risk Score and Plan: Propofol infusion and Treatment may vary due to age or medical condition  Airway Management Planned: Natural Airway  Additional Equipment:   Intra-op Plan:   Post-operative Plan:   Informed Consent: I have reviewed the patients History and Physical, chart, labs and discussed the procedure including the risks, benefits and alternatives for the proposed anesthesia with the patient or authorized representative who has indicated his/her understanding and acceptance.     Dental advisory given  Plan Discussed with: CRNA  Anesthesia Plan Comments:         Anesthesia Quick Evaluation

## 2020-12-16 NOTE — Progress Notes (Signed)
[name]  HER:[id number] DOB: [birth year] DOA: 12/15/2020 PCP: Cari Caraway, MD    Brief Narrative:  80 year old with a history of atrial fibrillation on chronic Eliquis, HTN, and knee surgery June 2022 who was brought to the ED by family due to very poor intake.  ROS in the ER revealed recent history of dark stools with a few days of epigastric discomfort.  The patient has been utilizing NSAIDs for knee pain following his surgery.  His hemoglobin was found to be 8.3, a significant drop from 11 ~1 month prior.  Stool was melanotic and guaiac positive.  CT abdomen and pelvis revealed features consistent with duodenitis.  Significant Events:  7/15 admit via [facility] ER 7/16 EGD  Consultants:  Gastroenterology  Code Status: FULL CODE  Antimicrobials:  None  DVT prophylaxis: SCDs  Subjective: Resting comfortably in bed.  Tolerated EGD without difficulty.  States he feels very tired and weak in general.  Admits to having very depressed mood.  States he is mildly hungry.  Denies shortness of breath or chest pain.  Assessment & Plan:  Acute GI bleed - duodenal ulcers  Likely due to NSAID use in setting of Eliquis use - EGD notes signif duodenal ulcers - PPI BID for 2 months then QD per GI recs - biopsies pending   Large HH Noted on EGD - cont PPI  Acute blood loss anemia Status post 1 unit PRBC 7/15 -Baseline hemoglobin 10.6 6/27 - monitor trend w/o further transfusion planned for now   Recent Labs  Lab 12/15/20 0843 12/15/20 2048 12/16/20 0144 12/16/20 0515  HGB 8.3* 8.6* 8.9* 8.8*    Macrocytosis Check E56 and folic acid  Chronic atrial fibrillation on Eliquis Rate presently controlled -anticoagulation on hold until tomorrow   HTN Blood pressure well controlled at present  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   Difficult to place patient No    Objective: Blood pressure 115/66, pulse 94, temperature 98.5 F (36.9 C), temperature source Oral, resp. rate 18, height 5\' 10"  (1.778 m), weight 108.9 kg, SpO2 98 %.  Intake/Output Summary (Last 24 hours) at 12/16/2020 1017 Last data filed at 12/16/2020 1016 Gross per 24 hour  Intake 539.61 ml  Output 600 ml  Net -60.39 ml   Filed Weights   12/15/20 0825 12/16/20 0946  Weight: 108.9 kg 108.9 kg    Examination: General: No acute respiratory distress - affect very flat  Lungs: Clear to auscultation bilaterally without wheezes or crackles Cardiovascular: Regular rate and rhythm without murmur gallop or rub normal S1 and S2 Abdomen: Nontender, nondistended, soft, bowel sounds positive, no rebound, no ascites, no appreciable mass Extremities: No significant cyanosis, clubbing, or edema bilateral lower extremities  CBC: Recent Labs  Lab 12/15/20 0843 12/15/20 2048 12/16/20 0144 12/16/20 0515  WBC 6.5 6.5 6.1 6.3  NEUTROABS 4.7  --   --   --  HGB 8.3* 8.6* 8.9* 8.8*  HCT 25.5* 27.0* 28.0* 28.0*  MCV 105.8* 105.1* 106.5* 105.7*  PLT 278 266 270 644   Basic Metabolic Panel: Recent Labs  Lab 12/15/20 0843  NA 134*  K 3.9  CL 101  CO2 22  GLUCOSE 98  BUN 23  CREATININE 1.26*  CALCIUM 8.9   GFR: Estimated Creatinine Clearance: 55.9 mL/min (A) (by C-G formula based on SCr of 1.26 mg/dL (H)).  Liver Function Tests: Recent Labs  Lab 12/15/20 0843  AST 35  ALT 21  ALKPHOS 60  BILITOT 0.9  PROT 6.1*  ALBUMIN 3.8    CBG: Recent Labs  Lab 12/16/20 0536   GLUCAP 95    Recent Results (from the past 240 hour(s))  Resp Panel by RT-PCR (Flu A&B, Covid) Nasopharyngeal Swab     Status: None   Collection Time: 12/15/20 11:45 AM   Specimen: Nasopharyngeal Swab; Nasopharyngeal(NP) swabs in vial transport medium  Result Value Ref Range Status   SARS Coronavirus 2 by RT PCR NEGATIVE NEGATIVE Final    Comment: (NOTE) SARS-CoV-2 target nucleic acids are NOT DETECTED.  The SARS-CoV-2 RNA is generally detectable in upper respiratory specimens during the acute phase of infection. The lowest concentration of SARS-CoV-2 viral copies this assay can detect is 138 copies/mL. A negative result does not preclude SARS-Cov-2 infection and should not be used as the sole basis for treatment or other patient management decisions. A negative result may occur with  improper specimen collection/handling, submission of specimen other than nasopharyngeal swab, presence of viral mutation(s) within the areas targeted by this assay, and inadequate number of viral copies(<138 copies/mL). A negative result must be combined with clinical observations, patient history, and epidemiological information. The expected result is Negative.  Fact Sheet for Patients:  EntrepreneurPulse.com.au  Fact Sheet for Healthcare Providers:  IncredibleEmployment.be  This test is no t yet approved or cleared by the [place] FDA and  has been authorized for detection and/or diagnosis of SARS-CoV-2 by FDA under an Emergency Use Authorization (EUA). This EUA will remain  in effect (meaning this test can be used) for the duration of the COVID-19 declaration under Section 564(b)(1) of the Act, 21 U.S.C.section 360bbb-3(b)(1), unless the authorization is terminated  or revoked sooner.       Influenza A by PCR NEGATIVE NEGATIVE Final   Influenza B by PCR NEGATIVE NEGATIVE Final    Comment: (NOTE) The Xpert Xpress SARS-CoV-2/FLU/RSV plus assay is  intended as an aid in the diagnosis of influenza from Nasopharyngeal swab specimens and should not be used as a sole basis for treatment. Nasal washings and aspirates are unacceptable for Xpert Xpress SARS-CoV-2/FLU/RSV testing.  Fact Sheet for Patients: EntrepreneurPulse.com.au  Fact Sheet for Healthcare Providers: IncredibleEmployment.be  This test is not yet approved or cleared by the [place] FDA and has been authorized for detection and/or diagnosis of SARS-CoV-2 by FDA under an Emergency Use Authorization (EUA). This EUA will remain in effect (meaning this test can be used) for the duration of the COVID-19 declaration under Section 564(b)(1) of the Act, 21 U.S.C. section 360bbb-3(b)(1), unless the authorization is terminated or revoked.  Performed at [facility], [place] [address] [place], [place] [address]      Scheduled Meds:  [facility] Hold] carvedilol  3.125 mg Oral QHS   [MAR Hold] pantoprazole  40 mg Intravenous Q12H   Continuous Infusions:  lactated ringers 10 mL/hr at 12/16/20 0952   pantoprazole 8 mg/hr (12/16/20 0047)  LOS: 0 days   Cherene Altes, MD [facility] Office  [phone] Pager - Text Page per Amion  If 7PM-7AM, please contact night-coverage per Amion 12/16/2020, 10:17 AM

## 2020-12-16 NOTE — Anesthesia Postprocedure Evaluation (Signed)
Anesthesia Post Note  Patient: [name]  Procedure(s) Performed: ESOPHAGOGASTRODUODENOSCOPY (EGD)     Patient location during evaluation: Endoscopy Anesthesia Type: MAC Level of consciousness: awake and alert Pain management: pain level controlled Vital Signs Assessment: post-procedure vital signs reviewed and stable Respiratory status: spontaneous breathing, nonlabored ventilation, respiratory function stable and patient connected to nasal cannula oxygen Cardiovascular status: blood pressure returned to baseline and stable Postop Assessment: no apparent nausea or vomiting Anesthetic complications: no   No notable events documented.  Last Vitals:  Vitals:   12/16/20 1050 12/16/20 1100  BP: (!) 89/57 97/61  Pulse:    Resp: 19 17  Temp:    SpO2:      Last Pain:  Vitals:   12/16/20 1100  TempSrc:   PainSc: 0-No pain                 Sanjith Siwek L Lucious Zou

## 2020-12-16 NOTE — Interval H&P Note (Signed)
History and Physical Interval Note: 80/male with anemia, melena, NSAIDs use, was on Eliquis, Large Hiatal hernia on CT with acute duodenitis for an EGD.  12/16/2020 10:02 AM  [name]  has presented today for EGD with the diagnosis of duodenitis on CT, anemia, melena.  The various methods of treatment have been discussed with the patient and family. After consideration of risks, benefits and other options for treatment, the patient has consented to  Procedure(s): ESOPHAGOGASTRODUODENOSCOPY (EGD) (N/A) as a surgical intervention.  [name] history has been reviewed, patient examined, no change in status, stable for surgery.  I have reviewed the patient's chart and labs.  Questions were answered to the patient's satisfaction.     Ronnette Juniper

## 2020-12-16 NOTE — Op Note (Signed)
EGD findings: Large over 10 cm hiatal hernia.   Biopsies taken for evaluation for H. Pylori.   Multiple superficial duodenal ulcers, duodenal bulb, first and second portion of the duodenum, biopsies taken.  Recommendation: Resume regular diet. Okay to restart Eliquis tomorrow in a.m.Marland Kitchen PPI twice daily for 2 months, if patient needs to be on Eliquis long-term, recommend PPI once a day indefinitely thereafter. Biopsies can be followed as an outpatient. Please recall GI if needed.

## 2020-12-17 ENCOUNTER — Encounter (HOSPITAL_COMMUNITY): Payer: Self-pay | Admitting: Gastroenterology

## 2020-12-17 DIAGNOSIS — F4321 Adjustment disorder with depressed mood: Secondary | ICD-10-CM

## 2020-12-17 LAB — GLUCOSE, CAPILLARY
Glucose-Capillary: 107 mg/dL — ABNORMAL HIGH (ref 70–99)
Glucose-Capillary: 109 mg/dL — ABNORMAL HIGH (ref 70–99)
Glucose-Capillary: 116 mg/dL — ABNORMAL HIGH (ref 70–99)
Glucose-Capillary: 117 mg/dL — ABNORMAL HIGH (ref 70–99)
Glucose-Capillary: 128 mg/dL — ABNORMAL HIGH (ref 70–99)

## 2020-12-17 LAB — COMPREHENSIVE METABOLIC PANEL
ALT: 18 U/L (ref 0–44)
AST: 24 U/L (ref 15–41)
Albumin: 3.7 g/dL (ref 3.5–5.0)
Alkaline Phosphatase: 58 U/L (ref 38–126)
Anion gap: 7 (ref 5–15)
BUN: 23 mg/dL (ref 8–23)
CO2: 28 mmol/L (ref 22–32)
Calcium: 8.8 mg/dL — ABNORMAL LOW (ref 8.9–10.3)
Chloride: 104 mmol/L (ref 98–111)
Creatinine, Ser: 1.39 mg/dL — ABNORMAL HIGH (ref 0.61–1.24)
GFR, Estimated: 51 mL/min — ABNORMAL LOW (ref >60)
Glucose, Bld: 114 mg/dL — ABNORMAL HIGH (ref 70–99)
Potassium: 4.8 mmol/L (ref 3.5–5.1)
Sodium: 139 mmol/L (ref 135–145)
Total Bilirubin: 0.8 mg/dL (ref 0.3–1.2)
Total Protein: 6 g/dL — ABNORMAL LOW (ref 6.5–8.1)

## 2020-12-17 LAB — RETICULOCYTES
Immature Retic Fract: 24.4 % — ABNORMAL HIGH (ref 2.3–15.9)
RBC.: 2.86 MIL/uL — ABNORMAL LOW (ref 4.22–5.81)
Retic Count, Absolute: 187.3 10*3/uL — ABNORMAL HIGH (ref 19.0–186.0)
Retic Ct Pct: 6.6 % — ABNORMAL HIGH (ref 0.4–3.1)

## 2020-12-17 LAB — TYPE AND SCREEN
ABO/RH(D): O POS
Antibody Screen: NEGATIVE
Unit division: 0

## 2020-12-17 LAB — BPAM RBC
Blood Product Expiration Date: 202208142359
ISSUE DATE / TIME: 202207151115
Unit Type and Rh: 5100

## 2020-12-17 LAB — FERRITIN: Ferritin: 109 ng/mL (ref 24–336)

## 2020-12-17 LAB — CBC
HCT: 31.1 % — ABNORMAL LOW (ref 39.0–52.0)
Hemoglobin: 9.7 g/dL — ABNORMAL LOW (ref 13.0–17.0)
MCH: 33.2 pg (ref 26.0–34.0)
MCHC: 31.2 g/dL (ref 30.0–36.0)
MCV: 106.5 fL — ABNORMAL HIGH (ref 80.0–100.0)
Platelets: 281 10*3/uL (ref 150–400)
RBC: 2.92 MIL/uL — ABNORMAL LOW (ref 4.22–5.81)
RDW: 16.5 % — ABNORMAL HIGH (ref 11.5–15.5)
WBC: 5.8 10*3/uL (ref 4.0–10.5)
nRBC: 0 % (ref 0.0–0.2)

## 2020-12-17 LAB — IRON AND TIBC
Iron: 37 ug/dL — ABNORMAL LOW (ref 45–182)
Saturation Ratios: 11 % — ABNORMAL LOW (ref 17.9–39.5)
TIBC: 351 ug/dL (ref 250–450)
UIBC: 314 ug/dL

## 2020-12-17 LAB — FOLATE: Folate: 22.8 ng/mL (ref >5.9)

## 2020-12-17 LAB — TSH: TSH: 2.723 u[IU]/mL (ref 0.350–4.500)

## 2020-12-17 LAB — VITAMIN B12: Vitamin B-12: 627 pg/mL (ref 180–914)

## 2020-12-17 MED ORDER — APIXABAN 5 MG PO TABS
5.0000 mg | ORAL_TABLET | Freq: Two times a day (BID) | ORAL | Status: DC
  Administered 2020-12-17 – 2020-12-18 (×3): 5 mg via ORAL
  Filled 2020-12-17 (×3): qty 1

## 2020-12-17 MED ORDER — PANTOPRAZOLE SODIUM 40 MG PO TBEC
40.0000 mg | DELAYED_RELEASE_TABLET | Freq: Two times a day (BID) | ORAL | Status: DC
  Administered 2020-12-18: 40 mg via ORAL
  Filled 2020-12-17: qty 1

## 2020-12-17 NOTE — Consult Note (Signed)
[facility] Consult   Reason for Consult:''suicidal ideation Medically stable for d/c from hospital - need f/u Psych eval to determine most appropriate psych dispo please.'' Referring Physician:  Joette Catching, MD Patient Identification: [name] MRN:  [id number] Principal Diagnosis: Adjustment disorder with depressed mood Diagnosis:  Principal Problem:   Adjustment disorder with depressed mood Active Problems:   Essential hypertension   Acute GI bleeding   Total Time spent with patient: 45 minutes  Subjective:   [name] is a 80 y.o. male patient admitted due to GI bleed.  HPI:  80 year old with a history of atrial fibrillation on chronic Eliquis, HTN, and knee surgery June 2022  and Major depression diagnosed few years ago for which he has been receiving Cymbalta from his PCP. Patient reports that he was admitted to the hospital due to poor oral intake and dark stool. Psychiatric consult was requested due to suicidal ideation. Patient reports that his wife of 33 years died in 09/04/2020 which made him depressed and thought about suicide. However, he states that he has had grief counseling through the hospice facility where his wife died which has helped him to cope with his loss. In addition, he reports that Cymbalta prescribed by his PCP has been very helpful. He also states that he has 10 grand children , 5 great grand children and will never take his life so as not to deprive them of the joy they have with him alive. Today , patient is alert, awake, oriented to time, place , person, denies psychosis, delusions and self harming thoughts.   Past Psychiatric History: as above  Risk to Self:  denies Risk to Others:  denies Prior Inpatient Therapy:  denies Prior Outpatient Therapy:  PCP  Past Medical History:  Past Medical History:  Diagnosis Date   Anginal pain ([place])    NONE IN 3 YEARS   Arthritis    Atrial fibrillation ([place]) 1960/09/04   a. No recurrence  since 1960-09-04.   Chest pain    a. 1998 Cath: nl cors;  b. 09/05/2007 Cath: nl cors.   Depression    Dysrhythmia    NO TROUBLE IN 1 YR    DR. PETER [place]    Esophageal reflux    Essential hypertension    History of Paroxysmal atrial flutter (HCC)    Hyperlipidemia    Incontinence of urine    Leg pain, bilateral    Nocturia    PONV (postoperative nausea and vomiting)    CONSTIPATED   Scarlet fever 1945   Spinal stenosis    Staph skin infection    LEFT GROIN  04/11/16  [place] W/ DOXYCYCLINE   Ulcerative proctitis ([place])     Past Surgical History:  Procedure Laterality Date   ANKLE RECONSTRUCTION Right September 04, 2005   [place]   ANTERIOR LAT LUMBAR FUSION Right 04/18/2016   Procedure: RIGHT LUMBAR THREE-FOUR, LUMBAR FOUR-FIVE ANTEROLATERAL LUMBAR INTERBODY FUSION;  Surgeon: Erline Levine, MD;  Location: [facility];  Service: Neurosurgery;  Laterality: Right;  RIGHT L3-4 L4-5 ANTEROLATERAL LUMBAR INTERBODY FUSION   CARDIAC CATHETERIZATION  03/03/2008   Left heart cardiac catheterization and coronary   CARDIAC CATHETERIZATION  Sep 05, 1995   Dr Wynonia Lawman   CATARACT EXTRACTION W/ INTRAOCULAR LENS  IMPLANT, BILATERAL     2014/09/05   ESOPHAGEAL DILATION     2006/09/05   INNER EAR SURGERY     EAR INJECTION FOR [place]  12/2010   LUMBAR LAMINECTOMY/DECOMPRESSION MICRODISCECTOMY Left 08/15/2016   Procedure: Left Left three- four Redo laminectomy;  Surgeon: Erline Levine, MD;  Location: [facility];  Service: Neurosurgery;  Laterality: Left;  Left L3-4 Redo laminectomy   LUMBAR PERCUTANEOUS PEDICLE SCREW 2 LEVEL N/A 04/18/2016   Procedure: LUMBAR THREE-FOUR, LUMBAR FOUR-FIVE PERCUTANEOUS PEDICLE SCREW;  Surgeon: Erline Levine, MD;  Location: [facility];  Service: Neurosurgery;  Laterality: N/A;   TOTAL HIP ARTHROPLASTY Left 04/22/2017   TOTAL HIP ARTHROPLASTY Left 04/22/2017   Procedure: TOTAL HIP ARTHROPLASTY ANTERIOR APPROACH;  Surgeon: Melrose Nakayama, MD;  Location: [facility];  Service:  Orthopedics;  Laterality: Left;   TOTAL KNEE ARTHROPLASTY Right 11/21/2020   Procedure: RIGHT TOTAL KNEE ARTHROPLASTY;  Surgeon: Melrose Nakayama, MD;  Location[facility];  Service: Orthopedics;  Laterality: Right;   Family History:  Family History  Problem Relation Age of Onset   Prostate cancer Father    Heart attack Brother    Kidney cancer Brother    Brain cancer Brother    Family Psychiatric  History:  Social History:  Social History   Substance and Sexual Activity  Alcohol Use Yes   Alcohol/week: 1.0 standard drink   Types: 1 Shots of liquor per week   Comment: occasionally     Social History   Substance and Sexual Activity  Drug Use No    Social History   Socioeconomic History   Marital status: Widowed    Spouse name: Bethena Roys   Number of children: 3   Years of education: college   Highest education level: Not on file  Occupational History    Comment: Retired   Tobacco Use   Smoking status: Some Days    Types: Pipe   Smokeless tobacco: Never  Vaping Use   Vaping Use: Never used  Substance and Sexual Activity   Alcohol use: Yes    Alcohol/week: 1.0 standard drink    Types: 1 Shots of liquor per week    Comment: occasionally   Drug use: No   Sexual activity: Not on file  Other Topics Concern   Not on file  Social History Narrative   Patient is retired and lives at home with his wife Bethena Roys.    Education college.   Caffeine one cup daily.   Social Determinants of Health   Financial Resource Strain: Not on file  Food Insecurity: Not on file  Transportation Needs: Not on file  Physical Activity: Not on file  Stress: Not on file  Social Connections: Not on file   Additional Social History:    Allergies:   Allergies  Allergen Reactions   Compazine [Prochlorperazine Edisylate] Other (See Comments)    EXTRAPYRAMIDAL MOVEMENT [Involuntary muscle movement]   Penicillins Nausea And Vomiting, Rash and Other (See Comments)    Has patient had a PCN reaction  causing immediate rash, facial/tongue/throat swelling, SOB or lightheadedness with hypotension: #  #  #  YES  #  #  #  Has patient had a PCN reaction causing severe rash involving mucus membranes or skin necrosis: No Has patient had a PCN reaction that required hospitalization No Has patient had a PCN reaction occurring within the last 10 years: No If all of the above answers are "NO", then may proceed with Cephalosporin use.    Welchol [Colesevelam Hcl] Other (See Comments)    MYALGIAS [Leg aches]   Gemfibrozil Nausea Only    PATIENT TOLERATES   Red Yeast Rice [Cholestin] Other (See Comments)  flushing    Labs:  Results for orders placed or performed during the hospital encounter of 12/15/20 (from the past 48 hour(s))  CBC     Status: Abnormal   Collection Time: 12/15/20  8:48 PM  Result Value Ref Range   WBC 6.5 4.0 - 10.5 K/uL   RBC 2.57 (L) 4.22 - 5.81 MIL/uL   Hemoglobin 8.6 (L) 13.0 - 17.0 g/dL   HCT 27.0 (L) 39.0 - 52.0 %   MCV 105.1 (H) 80.0 - 100.0 fL   MCH 33.5 26.0 - 34.0 pg   MCHC 31.9 30.0 - 36.0 g/dL   RDW 17.3 (H) 11.5 - 15.5 %   Platelets 266 150 - 400 K/uL   nRBC 0.0 0.0 - 0.2 %    Comment: Performed at [facility], [place] [address] [place], [place] [address]  CBC     Status: Abnormal   Collection Time: 12/16/20  1:44 AM  Result Value Ref Range   WBC 6.1 4.0 - 10.5 K/uL   RBC 2.63 (L) 4.22 - 5.81 MIL/uL   Hemoglobin 8.9 (L) 13.0 - 17.0 g/dL   HCT 28.0 (L) 39.0 - 52.0 %   MCV 106.5 (H) 80.0 - 100.0 fL   MCH 33.8 26.0 - 34.0 pg   MCHC 31.8 30.0 - 36.0 g/dL   RDW 17.4 (H) 11.5 - 15.5 %   Platelets 270 150 - 400 K/uL   nRBC 0.0 0.0 - 0.2 %    Comment: Performed at [facility], [place] [address] [place], [place] [address]  CBC     Status: Abnormal   Collection Time: 12/16/20  5:15 AM  Result Value Ref Range   WBC 6.3 4.0 - 10.5 K/uL   RBC 2.65 (L) 4.22 - 5.81 MIL/uL   Hemoglobin 8.8 (L) 13.0 - 17.0 g/dL   HCT 28.0  (L) 39.0 - 52.0 %   MCV 105.7 (H) 80.0 - 100.0 fL   MCH 33.2 26.0 - 34.0 pg   MCHC 31.4 30.0 - 36.0 g/dL   RDW 17.3 (H) 11.5 - 15.5 %   Platelets 265 150 - 400 K/uL   nRBC 0.0 0.0 - 0.2 %    Comment: Performed at [facility], [place] [address] [place], [place] [address]  Glucose, capillary     Status: None   Collection Time: 12/16/20  5:36 AM  Result Value Ref Range   Glucose-Capillary 95 70 - 99 mg/dL    Comment: Glucose reference range applies only to samples taken after fasting for at least 8 hours.  Glucose, capillary     Status: Abnormal   Collection Time: 12/16/20 11:32 AM  Result Value Ref Range   Glucose-Capillary 110 (H) 70 - 99 mg/dL    Comment: Glucose reference range applies only to samples taken after fasting for at least 8 hours.  Glucose, capillary     Status: Abnormal   Collection Time: 12/16/20  6:12 PM  Result Value Ref Range   Glucose-Capillary 117 (H) 70 - 99 mg/dL    Comment: Glucose reference range applies only to samples taken after fasting for at least 8 hours.  Glucose, capillary     Status: Abnormal   Collection Time: 12/17/20 12:02 AM  Result Value Ref Range   Glucose-Capillary 107 (H) 70 - 99 mg/dL    Comment: Glucose reference range applies only to samples taken after fasting for at least 8 hours.  Vitamin B12     Status: None  Collection Time: 12/17/20  5:44 AM  Result Value Ref Range   Vitamin B-12 627 180 - 914 pg/mL    Comment: (NOTE) This assay is not validated for testing neonatal or myeloproliferative syndrome specimens for Vitamin B12 levels. Performed at [facility], [place] [address], [place], [place] [address]   Folate     Status: None   Collection Time: 12/17/20  5:44 AM  Result Value Ref Range   Folate 22.8 >5.9 ng/mL    Comment: Performed at [facility], [place] [address] [place], [place] [address]  Iron and TIBC     Status: Abnormal   Collection Time: 12/17/20  5:44 AM   Result Value Ref Range   Iron 37 (L) 45 - 182 ug/dL   TIBC 351 250 - 450 ug/dL   Saturation Ratios 11 (L) 17.9 - 39.5 %   UIBC 314 ug/dL    Comment: Performed at [facility], [place] [address], [place], [place] [address]  Ferritin     Status: None   Collection Time: 12/17/20  5:44 AM  Result Value Ref Range   Ferritin 109 24 - 336 ng/mL    Comment: Performed at [facility], [place] [address], [place], [place] [address]  Reticulocytes     Status: Abnormal   Collection Time: 12/17/20  5:44 AM  Result Value Ref Range   Retic Ct Pct 6.6 (H) 0.4 - 3.1 %   RBC. 2.86 (L) 4.22 - 5.81 MIL/uL   Retic Count, Absolute 187.3 (H) 19.0 - 186.0 K/uL   Immature Retic Fract 24.4 (H) 2.3 - 15.9 %    Comment: Performed at [facility], [place] [address] [place], [place] [address]  CBC     Status: Abnormal   Collection Time: 12/17/20  5:44 AM  Result Value Ref Range   WBC 5.8 4.0 - 10.5 K/uL   RBC 2.92 (L) 4.22 - 5.81 MIL/uL   Hemoglobin 9.7 (L) 13.0 - 17.0 g/dL   HCT 31.1 (L) 39.0 - 52.0 %   MCV 106.5 (H) 80.0 - 100.0 fL   MCH 33.2 26.0 - 34.0 pg   MCHC 31.2 30.0 - 36.0 g/dL   RDW 16.5 (H) 11.5 - 15.5 %   Platelets 281 150 - 400 K/uL   nRBC 0.0 0.0 - 0.2 %    Comment: Performed at [facility], [place] [address], [place], [place] [address]  Comprehensive metabolic panel     Status: Abnormal   Collection Time: 12/17/20  5:44 AM  Result Value Ref Range   Sodium 139 135 - 145 mmol/L   Potassium 4.8 3.5 - 5.1 mmol/L    Comment: DELTA CHECK NOTED NO VISIBLE HEMOLYSIS    Chloride 104 98 - 111 mmol/L   CO2 28 22 - 32 mmol/L   Glucose, Bld 114 (H) 70 - 99 mg/dL    Comment: Glucose reference range applies only to samples taken after fasting for at least 8 hours.   BUN 23 8 - 23 mg/dL   Creatinine, Ser 1.39 (H) 0.61 - 1.24 mg/dL   Calcium 8.8 (L) 8.9 - 10.3 mg/dL   Total Protein 6.0 (L) 6.5 - 8.1 g/dL   Albumin 3.7 3.5 - 5.0 g/dL    AST 24 15 - 41 U/L   ALT 18 0 - 44 U/L   Alkaline Phosphatase 58 38 - 126 U/L   Total Bilirubin 0.8 0.3 - 1.2 mg/dL   GFR, Estimated  51 (L) >60 mL/min    Comment: (NOTE) Calculated using the CKD-EPI Creatinine Equation (2021)    Anion gap 7 5 - 15    Comment: Performed at [facility], [place] [address] [place], [place] [address]  TSH     Status: None   Collection Time: 12/17/20  5:44 AM  Result Value Ref Range   TSH 2.723 0.350 - 4.500 uIU/mL    Comment: Performed by a 3rd Generation assay with a functional sensitivity of <=0.01 uIU/mL. Performed at [facility], [place] [address], [place], [place] [address]   Glucose, capillary     Status: Abnormal   Collection Time: 12/17/20  5:51 AM  Result Value Ref Range   Glucose-Capillary 117 (H) 70 - 99 mg/dL    Comment: Glucose reference range applies only to samples taken after fasting for at least 8 hours.  Glucose, capillary     Status: Abnormal   Collection Time: 12/17/20 11:23 AM  Result Value Ref Range   Glucose-Capillary 109 (H) 70 - 99 mg/dL    Comment: Glucose reference range applies only to samples taken after fasting for at least 8 hours.    Current Facility-Administered Medications  Medication Dose Route Frequency Provider Last Rate Last Admin   acetaminophen (TYLENOL) tablet 650 mg  650 mg Oral Q6H PRN Ronnette Juniper, MD   650 mg at 12/17/20 1202   apixaban (ELIQUIS) tablet 5 mg  5 mg Oral BID Joette Catching T, MD   5 mg at 12/17/20 1200   carvedilol (COREG) tablet 3.125 mg  3.125 mg Oral QHS Ronnette Juniper, MD   3.125 mg at 12/16/20 2057   clonazepam (KLONOPIN) disintegrating tablet 0.25-0.5 mg  0.25-0.5 mg Oral QHS PRN Cherene Altes, MD   0.5 mg at 12/16/20 2139   DULoxetine (CYMBALTA) DR capsule 60 mg  60 mg Oral Daily Cherene Altes, MD   60 mg at 12/17/20 7902   ezetimibe (ZETIA) tablet 10 mg  10 mg Oral Daily Cherene Altes, MD   10 mg at 12/17/20 4097   methocarbamol  (ROBAXIN) tablet 750 mg  750 mg Oral Q6H PRN Cherene Altes, MD       oxyCODONE (Oxy IR/ROXICODONE) immediate release tablet 5-10 mg  5-10 mg Oral Q4H PRN Cherene Altes, MD   5 mg at 12/16/20 2057   pantoprazole (PROTONIX) injection 40 mg  40 mg Intravenous Q12H Ronnette Juniper, MD   40 mg at 12/17/20 0925   polyethylene glycol (MIRALAX / GLYCOLAX) packet 17 g  17 g Oral Daily PRN Cherene Altes, MD       pregabalin (LYRICA) capsule 300 mg  300 mg Oral QPM Cherene Altes, MD   300 mg at 12/16/20 1617   rosuvastatin (CRESTOR) tablet 10 mg  10 mg Oral QPM Cherene Altes, MD   10 mg at 12/16/20 1617   tamsulosin (FLOMAX) capsule 0.4 mg  0.4 mg Oral Daily Joette Catching T, MD   0.4 mg at 12/17/20 3532   traMADol (ULTRAM) tablet 50 mg  50 mg Oral Q6H PRN Cherene Altes, MD   50 mg at 12/17/20 9924    Musculoskeletal: Strength & Muscle Tone:  Not tested Gait & Station: unsteady Patient leans: Front     Psychiatric Specialty Exam:  Presentation  General Appearance: Appropriate for Environment  Eye Contact:Good  Speech:Clear and Coherent; Normal Rate  Speech Volume:Normal  Handedness:Right   Mood and Affect  Mood:Euthymic  Affect:Appropriate   Thought Process  Thought Processes:Linear  Descriptions of Associations:Intact  Orientation:Full (Time, Place and Person)  Thought Content:Logical  History of Schizophrenia/Schizoaffective disorder:No  Duration of Psychotic Symptoms:No data recorded Hallucinations:Hallucinations: None  Ideas of Reference:None  Suicidal Thoughts:Suicidal Thoughts: No  Homicidal Thoughts:Homicidal Thoughts: No   Sensorium  Memory:Immediate Good; Remote Good; Recent Good  Judgment:Good  Insight:Good   Executive Functions  Concentration:Good  Attention Span:Good  [place] of Knowledge:Good  Language:Good   Psychomotor Activity  Psychomotor Activity:Psychomotor Activity: Normal   Assets   Assets:Communication Skills; Desire for Improvement   Sleep  Sleep:Sleep: Fair   Physical Exam: Physical Exam Psychiatric:        Attention and Perception: Attention and perception normal.        Mood and Affect: Mood and affect normal.        Speech: Speech normal.        Behavior: Behavior normal. Behavior is cooperative.        Thought Content: Thought content normal.        Cognition and Memory: Cognition and memory normal.        Judgment: Judgment normal.   ROS Blood pressure 103/65, pulse 84, temperature 97.8 F (36.6 C), resp. rate 18, height 5\' 10"  (1.778 m), weight 108.9 kg, SpO2 98 %. Body mass index is 34.44 kg/m.  Treatment Plan Summary: 80 year old male with history of depression who was admitted due to dark stool but expression suicidal thought after his wife of 56 year died in Sep 15, 2022. However, patient reports improvement of his mental states after he received grief counseling and Cymbalta. Today, he denies psychosis, delusions, anxiety, suicidal ideation, intent or plan. He is cleared by psychiatric service, as patient no longer meet criteria for inpatient geriatric admission.  Recommendations: -Continue Cymbalta 60 mg daily for depression -Refer patient to his PCP upon discharge for continuation of medication management-per patient's request.   Disposition: No evidence of imminent risk to self or others at present.   Patient does not meet criteria for psychiatric inpatient admission. Supportive therapy provided about ongoing stressors. Psychiatric service signing out. Re-consult as needed  Corena Pilgrim, MD 12/17/2020 12:45 PM

## 2020-12-17 NOTE — [place] Notes (Signed)
[name]  WER:[id number] DOB: 29-Jan-1941 DOA: 12/15/2020 PCP: Cari Caraway, MD    Brief Narrative:  80yo with a history of atrial fibrillation on chronic Eliquis, HTN, and knee surgery June 2022 who was brought to the ED by family due to very poor intake.  ROS in the ER revealed recent history of dark stools with a few days of epigastric discomfort.  The patient has been utilizing NSAIDs for knee pain following his surgery.  His hemoglobin was found to be 8.3, a significant drop from 11 ~1 month prior.  Stool was melanotic and guaiac positive.  CT abdomen and pelvis revealed features consistent with duodenitis.  Significant Events:  7/15 admit via [facility] ER 7/16 EGD  Consultants:  Gastroenterology  Code Status: FULL CODE  Antimicrobials:  None  DVT prophylaxis: SCDs  Subjective: Spirits dramatically improved.  Patient is alert and jovial and quite pleasant.  He states he feels much better overall.  No new complaints.  Is tolerating diet well thus far.  Assessment & Plan:  Acute GI bleed - duodenal ulcers  Likely due to NSAID use in setting of Eliquis use - EGD notes signif duodenal ulcers - PPI BID for 2 months then QD per GI recs - biopsies pending   Large HH Noted on EGD - cont PPI  Acute blood loss anemia Status post 1 unit PRBC 7/15 -Baseline hemoglobin 10.6 6/27 -  Hgb has stabilized -recheck in a.m.  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   Difficult to place patient No    Objective: Blood pressure 103/65, pulse 84, temperature 97.8 F (36.6 C), resp. rate 18, height 5\' 10"  (1.778 m), weight 108.9 kg, SpO2 98 %.  Intake/Output Summary (Last 24 hours) at 12/17/2020 1049 Last data filed at 12/17/2020 0932 Gross per 24 hour  Intake 632.09 ml  Output 300 ml  Net 332.09 ml    Filed Weights   12/15/20 0825 12/16/20 0946  Weight: 108.9 kg 108.9 kg    Examination: General: No acute respiratory distress  Lungs: CTA B without wheezing Cardiovascular: RRR without murmur Abdomen: NT/ND, soft, BS positive, no rebound Extremities: No significant edema bilateral lower extremities  CBC: Recent Labs  Lab 12/15/20 0843 12/15/20 2048 12/16/20 0144 12/16/20 0515 12/17/20 0544  WBC 6.5   < > 6.1 6.3 5.8  NEUTROABS 4.7  --   --   --   --   HGB 8.3*   < > 8.9* 8.8* 9.7*  HCT 25.5*   < > 28.0* 28.0* 31.1*  MCV 105.8*   < > 106.5* 105.7* 106.5*  PLT 278   < > 270 265 281   < > = values in this interval not displayed.    Basic Metabolic Panel: Recent Labs  Lab 12/15/20 0843 12/17/20 0544  NA 134* 139  K 3.9 4.8  CL 101 104  CO2 22 28  GLUCOSE 98 114*  BUN 23 23  CREATININE 1.26* 1.39*  CALCIUM 8.9 8.8*    GFR: Estimated Creatinine Clearance: 50.7 mL/min (A) (by C-G formula based on SCr of 1.39 mg/dL (H)).  Liver Function Tests: Recent Labs  Lab 12/15/20 0843 12/17/20 0544  AST 35 24  ALT 21 18  ALKPHOS 60 58  BILITOT 0.9 0.8  PROT 6.1* 6.0*  ALBUMIN 3.8 3.7     CBG: Recent Labs  Lab 12/16/20 0536 12/16/20 1132 12/16/20 1812  12/17/20 0002 12/17/20 0551  GLUCAP 95 110* 117* 107* 117*     Recent Results (from the past 240 hour(s))  Resp Panel by RT-PCR (Flu A&B, Covid) Nasopharyngeal Swab     Status: None   Collection Time: 12/15/20 11:45 AM   Specimen: Nasopharyngeal Swab; Nasopharyngeal(NP) swabs in vial transport medium  Result Value Ref Range Status   SARS Coronavirus 2 by RT PCR NEGATIVE NEGATIVE Final    Comment: (NOTE) SARS-CoV-2 target nucleic acids are NOT DETECTED.  The SARS-CoV-2 RNA is generally detectable in upper respiratory specimens during the acute phase of infection. The lowest concentration of SARS-CoV-2 viral copies this assay can detect is 138 copies/mL. A negative result does not preclude SARS-Cov-2 infection and should not be used as the sole basis for treatment or other patient management decisions. A negative result may occur with  improper specimen collection/handling, submission of specimen other than nasopharyngeal swab, presence of viral mutation(s) within the areas targeted by this assay, and inadequate number of viral copies(<138 copies/mL). A negative result must be combined with clinical observations, patient history, and epidemiological information. The expected result is Negative.  Fact Sheet for Patients:  EntrepreneurPulse.com.au  Fact Sheet for Healthcare Providers:  IncredibleEmployment.be  This test is no t yet approved or cleared by the [place] FDA and  has been authorized for detection and/or diagnosis of SARS-CoV-2 by FDA under an Emergency Use Authorization (EUA). This EUA will remain  in effect (meaning this test can be used) for the duration of the COVID-19 declaration under Section 564(b)(1) of the Act, 21 U.S.C.section 360bbb-3(b)(1), unless the authorization is terminated  or revoked sooner.       Influenza A by PCR NEGATIVE NEGATIVE Final   Influenza B by PCR NEGATIVE NEGATIVE Final    Comment:  (NOTE) The Xpert Xpress SARS-CoV-2/FLU/RSV plus assay is intended as an aid in the diagnosis of influenza from Nasopharyngeal swab specimens and should not be used as a sole basis for treatment. Nasal washings and aspirates are unacceptable for Xpert Xpress SARS-CoV-2/FLU/RSV testing.  Fact Sheet for Patients: EntrepreneurPulse.com.au  Fact Sheet for Healthcare Providers: IncredibleEmployment.be  This test is not yet approved or cleared by the [place] FDA and has been authorized for detection and/or diagnosis of SARS-CoV-2 by FDA under an Emergency Use Authorization (EUA). This EUA will remain in effect (meaning this test can be used) for the duration of the COVID-19 declaration under Section 564(b)(1) of the Act, 21 U.S.C. section 360bbb-3(b)(1), unless the authorization is terminated or revoked.  Performed at [facility], [place] [address] [place], [place] [address]       Scheduled Meds:  carvedilol  3.125 mg Oral QHS   DULoxetine  60 mg Oral Daily   ezetimibe  10 mg Oral Daily   pantoprazole  40 mg Intravenous Q12H   pregabalin  300 mg Oral QPM   rosuvastatin  10 mg Oral QPM   tamsulosin  0.4 mg Oral Daily      LOS: 0 days   Cherene Altes, MD [facility] Office  [phone] Pager - Text Page per Shea Evans  If 7PM-7AM, please contact night-coverage per Amion 12/17/2020, 10:49 AM

## 2020-12-17 NOTE — Evaluation (Signed)
Occupational Therapy Evaluation Patient Details Name: [name] MRN: [id number] DOB: December 07, 1940 Today's Date: 12/17/2020    History of Present Illness [name] is a 80 y.o. male with history of A. fib, hypertension who has had a knee surgery last month and admitted to hospital for GI bleed. Patient s/p EGD 7/16.   Clinical Impression   Mr. [name] is an 80 year old man admitted to hospital with GI bleed. Patient had TKA on November 21, 2020, lost his wife in March and has had a functional and emotional decline since then. On evaluation patient able to perform functional mobility and ADLs near his baseline - does use a long handled reacher at home. Patient has intermittent caregiver assist and assistance from his son. Reports recovery of total knee has been interrupted due to medical complications and the emotional fall out of losing his wife. Patient had one loss of balance standing at the sink when he closed his eyes to wash his face. Therapist educated patient on how to perform ADLs safely in sitting and to not close eyes in standing until RLE and balance restored to baseline. Patient is safe to return home with his current set up. Defer to PT for further therapy recommendations. No OT needs at this time.      Follow Up Recommendations  No OT follow up    Equipment Recommendations  None recommended by OT    Recommendations for Other Services       Precautions / Restrictions Precautions Precautions: Fall Precaution Comments: reports no falls at home Restrictions Weight Bearing Restrictions: No RLE Weight Bearing: Weight bearing as tolerated      Mobility Bed Mobility Overal bed mobility: Modified Independent                  Transfers Overall transfer level: Needs assistance Equipment used: Rolling walker (2 wheeled) Transfers: Sit to/from Stand Sit to Stand: Min guard         General transfer comment: Min guard to ambulate in room with RW.    Balance  Overall balance assessment: Mild deficits observed, not formally tested   Sitting balance-Leahy Scale: Normal     Standing balance support: During functional activity Standing balance-Leahy Scale: Fair Standing balance comment: One posterior loss of balance in standing when he closed his eyes for grooming.                           ADL either performed or assessed with clinical judgement   ADL Overall ADL's : At baseline                                       General ADL Comments: Uses a reacher for lower body dressing .     Vision Baseline Vision/History: Wears glasses Wears Glasses: At all times       Perception     Praxis      Pertinent Vitals/Pain Pain Assessment: No/denies pain     Hand Dominance Right   Extremity/Trunk Assessment Upper Extremity Assessment Upper Extremity Assessment: Overall WFL for tasks assessed   Lower Extremity Assessment Lower Extremity Assessment: Defer to PT evaluation   Cervical / Trunk Assessment Cervical / Trunk Assessment: Normal   Communication Communication Communication: No difficulties   Cognition Arousal/Alertness: Awake/alert Behavior During Therapy: WFL for tasks assessed/performed Overall Cognitive Status: Within Functional Limits  for tasks assessed                                     General Comments       Exercises     Shoulder Instructions      Home Living Family/patient expects to be discharged to:: Private residence Living Arrangements: Alone Available Help at Discharge: Family;Friend(s);Available PRN/intermittently (Has intermittent assistance from paid caregivers. Not daily though.) Type of Home: House Home Access: Stairs to enter [employer] of Steps: 4 Entrance Stairs-Rails: Left Home Layout: One level     Bathroom Shower/Tub: Occupational psychologist: Handicapped height     Home Equipment: Environmental consultant - 2 wheels;Walker - 4 wheels;Cane -  single point;Bedside commode;Shower seat   Additional Comments: son can stay first night, may be home alone after that, family trying to  get coverage 24/7      Prior Functioning/Environment Level of Independence: Independent with assistive device(s)        Comments: Wife died after knee replacement interrupting therapy and recovery. Became depressed. Has son that assists him as needed. Looking to sale his home and looking at a retirement facility. Able to perform ADLs but has difficulty with IADLs.        OT Problem List: Impaired balance (sitting and/or standing)      OT Treatment/Interventions:      OT Goals(Current goals can be found in the care plan section) Acute Rehab OT Goals Patient Stated Goal: To regain strength and independence OT Goal Formulation: All assessment and education complete, DC therapy  OT Frequency:     Barriers to D/C:            Co-evaluation              AM-PAC OT "6 Clicks" Daily Activity     Outcome Measure Help from another person eating meals?: None Help from another person taking care of personal grooming?: None Help from another person toileting, which includes using toliet, bedpan, or urinal?: None Help from another person bathing (including washing, rinsing, drying)?: None Help from another person to put on and taking off regular upper body clothing?: None Help from another person to put on and taking off regular lower body clothing?: None 6 Click Score: 24   End of Session Equipment Utilized During Treatment: Rolling walker Nurse Communication: Mobility status  Activity Tolerance: Patient tolerated treatment well Patient left: in chair;with call bell/phone within reach  OT Visit Diagnosis: Unsteadiness on feet (R26.81)                Time: 2992-4268 OT Time Calculation (min): 21 min Charges:  OT General Charges $OT Visit: 1 Visit OT Evaluation $OT Eval Low Complexity: 1 Low  Ammar Moffatt, OTR/L [place]   Office [phone] Pager: [place] 12/17/2020, 9:35 AM

## 2020-12-17 NOTE — Evaluation (Signed)
Physical Therapy Evaluation Patient Details Name: [name] MRN: [id number] DOB: 03-Mar-1941 Today's Date: 12/17/2020   History of Present Illness  [name] is a 80 y.o. male with history of A. fib, hypertension who has had a knee surgery last month and admitted to hospital for GI bleed. Patient s/p EGD 7/16.  Clinical Impression  Pt admitted as above and presenting with functional mobility limitations 2* ambulatory balance deficits, decreased endurance, and residual R LE weakness/pain following slow recovery from recent R TKR.  Pt plans dc home with increased assist and to continue with OP PT which he had just started for TKR rehab.    Follow Up Recommendations Outpatient PT    Equipment Recommendations  None recommended by PT    Recommendations for Other Services       Precautions / Restrictions Precautions Precautions: Fall Precaution Comments: reports no falls at home Restrictions Weight Bearing Restrictions: No RLE Weight Bearing: Weight bearing as tolerated      Mobility  Bed Mobility Overal bed mobility: Modified Independent                  Transfers Overall transfer level: Needs assistance Equipment used: Rolling walker (2 wheeled) Transfers: Sit to/from Stand Sit to Stand: Min guard;Supervision         General transfer comment: cues for use of UEs to self assist  Ambulation/Gait Ambulation/Gait assistance: Min guard Gait Distance (Feet): 120 Feet Assistive device: Rolling walker (2 wheeled) Gait Pattern/deviations: Step-to pattern;Step-through pattern;Decreased step length - right;Decreased step length - left;Shuffle;Trunk flexed     General Gait Details: Steady assist, cues for posture and position from [employer]            Wheelchair Mobility    Modified Rankin (Stroke Patients Only)       Balance Overall balance assessment: Mild deficits observed, not formally tested   Sitting balance-Leahy Scale: Normal     Standing  balance support: During functional activity Standing balance-Leahy Scale: Fair Standing balance comment: One posterior loss of balance in standing when he closed his eyes for grooming.                             Pertinent Vitals/Pain Pain Assessment: 0-10 Pain Score: 3  Pain Location: R knee following mobility Pain Descriptors / Indicators: Sore Pain Intervention(s): Limited activity within patient's tolerance;Monitored during session;Patient requesting pain meds-RN notified    Home Living Family/patient expects to be discharged to:: Private residence Living Arrangements: Alone Available Help at Discharge: Family;Friend(s);Available PRN/intermittently Type of Home: House Home Access: Stairs to enter Entrance Stairs-Rails: Left Entrance Stairs-Number of Steps: 4 Home Layout: One level Home Equipment: Walker - 2 wheels;Walker - 4 wheels;Cane - single point;Bedside commode;Shower seat Additional Comments: son can stay first night. Family trying to  get coverage 24/7.  Pt states caregiver who assisted with his wife will be assisting.    Prior Function Level of Independence: Independent with assistive device(s)         Comments: Wife died after knee replacement interrupting therapy and recovery. Became depressed. Has son that assists him as needed. Looking to sale his home and looking at a retirement facility. Able to perform ADLs but has difficulty with IADLs.     Hand Dominance   Dominant Hand: Right    Extremity/Trunk Assessment   Upper Extremity Assessment Upper Extremity Assessment: Overall WFL for tasks assessed    Lower Extremity Assessment Lower  Extremity Assessment: RLE deficits/detail RLE Deficits / Details: Noted R knee edema with lack of terminal extension and full flexion.    Cervical / Trunk Assessment Cervical / Trunk Assessment: Normal  Communication   Communication: No difficulties  Cognition Arousal/Alertness: Awake/alert Behavior During  Therapy: WFL for tasks assessed/performed Overall Cognitive Status: Within Functional Limits for tasks assessed                                        General Comments      Exercises     Assessment/Plan    PT Assessment Patient needs continued PT services  PT Problem List Decreased strength;Decreased range of motion;Decreased activity tolerance;Decreased balance;Decreased mobility;Pain       PT Treatment Interventions DME instruction;Gait training;Stair training;Functional mobility training;Therapeutic activities;Therapeutic exercise;Patient/family education;Balance training    PT Goals (Current goals can be found in the Care Plan section)  Acute Rehab PT Goals Patient Stated Goal: To regain strength and independence PT Goal Formulation: With patient Time For Goal Achievement: 12/31/20 Potential to Achieve Goals: Good    Frequency Min 3X/week   Barriers to discharge Decreased caregiver support Working on 24/7    Co-evaluation               AM-PAC PT "6 Clicks" Mobility  Outcome Measure Help needed turning from your back to your side while in a flat bed without using bedrails?: None Help needed moving from lying on your back to sitting on the side of a flat bed without using bedrails?: None Help needed moving to and from a bed to a chair (including a wheelchair)?: A Little Help needed standing up from a chair using your arms (e.g., wheelchair or bedside chair)?: A Little Help needed to walk in hospital room?: A Little Help needed climbing 3-5 steps with a railing? : A Little 6 Click Score: 20    End of Session Equipment Utilized During Treatment: Gait belt Activity Tolerance: Patient tolerated treatment well;Patient limited by fatigue Patient left: in chair;with call bell/phone within reach;with nursing/sitter in room Nurse Communication: Mobility status PT Visit Diagnosis: Difficulty in walking, not elsewhere classified (R26.2)    Time:  6045-4098 PT Time Calculation (min) (ACUTE ONLY): 22 min   Charges:   PT Evaluation $PT Eval Low Complexity: 1 Low          [place] Pager [phone] Office [phone]   Tida Saner 12/17/2020, 12:54 PM

## 2020-12-18 ENCOUNTER — Encounter: Payer: Self-pay | Admitting: Neurology

## 2020-12-18 ENCOUNTER — Ambulatory Visit: Payer: Medicare Other | Admitting: Neurology

## 2020-12-18 DIAGNOSIS — F4321 Adjustment disorder with depressed mood: Secondary | ICD-10-CM | POA: Diagnosis not present

## 2020-12-18 LAB — CBC
HCT: 27.8 % — ABNORMAL LOW (ref 39.0–52.0)
Hemoglobin: 8.9 g/dL — ABNORMAL LOW (ref 13.0–17.0)
MCH: 33.8 pg (ref 26.0–34.0)
MCHC: 32 g/dL (ref 30.0–36.0)
MCV: 105.7 fL — ABNORMAL HIGH (ref 80.0–100.0)
Platelets: 256 10*3/uL (ref 150–400)
RBC: 2.63 MIL/uL — ABNORMAL LOW (ref 4.22–5.81)
RDW: 15.9 % — ABNORMAL HIGH (ref 11.5–15.5)
WBC: 5.4 10*3/uL (ref 4.0–10.5)
nRBC: 0 % (ref 0.0–0.2)

## 2020-12-18 LAB — GLUCOSE, CAPILLARY
Glucose-Capillary: 107 mg/dL — ABNORMAL HIGH (ref 70–99)
Glucose-Capillary: 99 mg/dL (ref 70–99)

## 2020-12-18 LAB — BASIC METABOLIC PANEL
Anion gap: 6 (ref 5–15)
BUN: 23 mg/dL (ref 8–23)
CO2: 27 mmol/L (ref 22–32)
Calcium: 8.2 mg/dL — ABNORMAL LOW (ref 8.9–10.3)
Chloride: 103 mmol/L (ref 98–111)
Creatinine, Ser: 1.27 mg/dL — ABNORMAL HIGH (ref 0.61–1.24)
GFR, Estimated: 56 mL/min — ABNORMAL LOW (ref >60)
Glucose, Bld: 104 mg/dL — ABNORMAL HIGH (ref 70–99)
Potassium: 3.9 mmol/L (ref 3.5–5.1)
Sodium: 136 mmol/L (ref 135–145)

## 2020-12-18 MED ORDER — OXYCODONE HCL 5 MG PO TABS: 5.0000 mg | ORAL_TABLET | ORAL | 0 refills | Status: DC | PRN

## 2020-12-18 MED ORDER — ACETAMINOPHEN 325 MG PO TABS: 650.0000 mg | ORAL_TABLET | Freq: Four times a day (QID) | ORAL | Status: DC | PRN

## 2020-12-18 MED ORDER — PANTOPRAZOLE SODIUM 40 MG PO TBEC: 40.0000 mg | DELAYED_RELEASE_TABLET | Freq: Two times a day (BID) | ORAL | 1 refills | Status: DC

## 2020-12-18 MED ORDER — TRAMADOL HCL 50 MG PO TABS: 50.0000 mg | ORAL_TABLET | Freq: Four times a day (QID) | ORAL | 0 refills | Status: DC | PRN

## 2020-12-18 NOTE — TOC Transition Note (Signed)
Transition of Care [facility]) - CM/SW Discharge Note   Patient Details  Name: VYOM BRASS MRN: [id number] Date of Birth: May 27, 1941  [facility] CM/SW Contact:  Trish Mage, LCSW Phone Number: 12/18/2020, 11:47 AM   Clinical Narrative:   Patient seen in follow up to PT recommendation of OPPT.  Daughter Wells Guiles was in room with patient.  I was able to clarify recommendations of psychiatry for them.  Mr Raska lives at home alone, his offspring are helping him look into retirement communities, and he hopes to move to one soon.  In the meantime, he lives at home alone and often has a friend come check on him or stay overnight as needed.  He has all needed DME, plans to resume outpatient PT services with [facility]. No further needs identified.  TOC sign off.    Final next level of care: Home/Self Care Barriers to Discharge: No Barriers Identified   Patient Goals and CMS Choice        Discharge Placement                       Discharge Plan and Services                                     Social Determinants of Health (SDOH) Interventions     Readmission Risk Interventions No flowsheet data found.

## 2020-12-18 NOTE — Discharge Summary (Signed)
DISCHARGE SUMMARY  [name]  MR#: [id number]  DOB:16-May-1941  Date of Admission: 12/15/2020 Date of Discharge: 12/18/2020  Attending Physician:Emileo Semel Hennie Duos, MD  Patient's XLK:[id number], Abigail Butts, MD  Consults: GI  Disposition: D/C home   Follow-up Appts:  Follow-up Information     Cari Caraway, MD Follow up in 3 day(s).   Specialty: Family Medicine Why: You need to have a recheck of your blood count in 3 days, and a follow up check. 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  ROS in the ER revealed recent history of dark stools with a few days of epigastric discomfort.  The patient has been utilizing NSAIDs for knee pain following his surgery.  His hemoglobin was found to be 8.3, a significant drop from 11 ~1 month prior.  Stool was melanotic and guaiac positive.  CT abdomen and pelvis revealed features consistent with duodenitis.  Hospital Course:  Acute GI bleed - duodenal ulcers Likely due to NSAID use in setting of Eliquis use - EGD notes signif duodenal ulcers - PPI BID for 2 months then QD per GI recs - biopsies pending at time of d/c    Large HH Noted on EGD - cont PPI   Acute blood loss anemia Status post 1 unit PRBC 7/15 -Baseline hemoglobin 10.6 6/27 -  Hgb has stabilized during this admit (with the one 9.7 felt to be an outlier) - no clinical  evidence of bleeding at time of d/c   Recent Labs  Lab 12/15/20 2048 12/16/20 0144 12/16/20 0515 12/17/20 0544 12/18/20 0442  HGB 8.6* 8.9* 8.8* 9.7* 8.9*     Macrocytosis D63 and folic acid are normal   Chronic atrial fibrillation on Eliquis Rate controlled -with stabilization of hemoglobin and identification of source we resumed Eliquis the day prior to his d/c home    HTN Blood pressure well controlled during this admit    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[facility])   Major depression Evaluated by [facility] at time of admission and felt to be high risk for suicide - ongoing care per Psychiatry who subsequently cleared him for outpatient follow-up - affect bright and cheery at time of d/c   Allergies as of 12/18/2020       Reactions   Compazine [prochlorperazine Edisylate] Other (See Comments)   EXTRAPYRAMIDAL MOVEMENT [Involuntary muscle movement]   Penicillins Nausea And Vomiting, Rash, Other (See Comments)   Has patient had a PCN reaction causing immediate rash, facial/tongue/throat swelling, SOB or lightheadedness with hypotension: #  #  #  YES  #  #  #  Has patient had a PCN reaction causing severe rash involving mucus membranes or skin necrosis: No Has patient had a PCN reaction that required hospitalization No Has patient had a PCN reaction occurring within the last 10 years: No If all of the above answers are "NO",  then may proceed with Cephalosporin use.   Welchol [colesevelam Hcl] Other (See Comments)   MYALGIAS [Leg aches]   Gemfibrozil Nausea Only   PATIENT TOLERATES   Red Yeast Rice [cholestin] Other (See Comments)   flushing        Medication List     STOP taking these medications    HYDROcodone-acetaminophen 5-325 MG tablet Commonly known as: NORCO/VICODIN   lisinopril 5 MG tablet Commonly known as: ZESTRIL   methocarbamol 750 MG tablet Commonly known as: ROBAXIN   omeprazole 20 MG capsule Commonly known as: PRILOSEC       TAKE  these medications    acetaminophen 325 MG tablet Commonly known as: TYLENOL Take 2 tablets (650 mg total) by mouth every 6 (six) hours as needed for mild pain (or Fever >/= 101).   ARTIFICIAL TEAR OP Place 1 drop into both eyes daily as needed (dry eyes).   carvedilol 3.125 MG tablet Commonly known as: COREG Take 3.125 mg by mouth at bedtime.   clonazePAM 0.5 MG tablet Commonly known as: KLONOPIN take 1/2-1 TABLET BY MOUTH AT BEDTIME AS NEEDED What changed:  how much to take how to take this when to take this reasons to take this additional instructions   DULoxetine 60 MG capsule Commonly known as: CYMBALTA Take 1 capsule (60 mg total) by mouth daily.   Eliquis 5 MG Tabs tablet Generic drug: apixaban Take 1 tablet (5 mg total) by mouth 2 (two) times daily.   ezetimibe 10 MG tablet Commonly known as: ZETIA Take 10 mg by mouth daily.   ferrous sulfate 324 (65 Fe) MG Tbec Take 324 mg by mouth daily.   MULTIVITAMIN PO Take 1 tablet by mouth daily.   nitroGLYCERIN 0.4 MG SL tablet Commonly known as: NITROSTAT Place 0.4 mg under the tongue every 5 (five) minutes as needed for chest pain.   oxyCODONE 5 MG immediate release tablet Commonly known as: Oxy IR/ROXICODONE Take 1-2 tablets (5-10 mg total) by mouth every 4 (four) hours as needed for severe pain.   pantoprazole 40 MG tablet Commonly known as: PROTONIX Take 1 tablet (40 mg total) by mouth 2 (two) times daily before a meal.   polyethylene glycol 17 g packet Commonly known as: MIRALAX / GLYCOLAX Take 17 g by mouth daily as needed for mild constipation.   pregabalin 100 MG capsule Commonly known as: LYRICA TAKE THREE CAPSULES BY MOUTH EVERY EVENING What changed: See the new instructions.   rOPINIRole 0.5 MG tablet Commonly known as: REQUIP Take 1 tablet (0.5 mg total) by mouth at bedtime.   rosuvastatin 10 MG tablet Commonly known as: CRESTOR TAKE 1 TABLET ONCE DAILY OR AS DIRECTED. What changed: See  the new instructions.   tamsulosin 0.4 MG Caps capsule Commonly known as: FLOMAX Take 0.4 mg by mouth daily.   traMADol 50 MG tablet Commonly known as: ULTRAM Take 1 tablet (50 mg total) by mouth every 6 (six) hours as needed for moderate pain.        Day of Discharge BP 98/63 (BP Location: Right Arm)   Pulse 75   Temp 97.7 F (36.5 C) (Oral)   Resp 20   Ht 5\' 10"  (1.778 m)   Wt 108.9 kg   SpO2 93%   BMI 34.44 kg/m   Physical Exam: General: No acute respiratory distress Lungs: Clear to auscultation bilaterally without wheezes or crackles Cardiovascular: Regular rate and rhythm without murmur gallop or rub normal S1 and S2 Abdomen: Nontender, nondistended,  soft, bowel sounds positive, no rebound, no ascites, no appreciable mass Extremities: No significant cyanosis, clubbing, or edema bilateral lower extremities  Basic Metabolic Panel: Recent Labs  Lab 12/15/20 0843 12/17/20 0544 12/18/20 0442  NA 134* 139 136  K 3.9 4.8 3.9  CL 101 104 103  CO2 22 28 27   GLUCOSE 98 114* 104*  BUN 23 23 23   CREATININE 1.26* 1.39* 1.27*  CALCIUM 8.9 8.8* 8.2*    Liver Function Tests: Recent Labs  Lab 12/15/20 0843 12/17/20 0544  AST 35 24  ALT 21 18  ALKPHOS 60 58  BILITOT 0.9 0.8  PROT 6.1* 6.0*  ALBUMIN 3.8 3.7    CBC: Recent Labs  Lab 12/15/20 0843 12/15/20 2048 12/16/20 0144 12/16/20 0515 12/17/20 0544 12/18/20 0442  WBC 6.5 6.5 6.1 6.3 5.8 5.4  NEUTROABS 4.7  --   --   --   --   --   HGB 8.3* 8.6* 8.9* 8.8* 9.7* 8.9*  HCT 25.5* 27.0* 28.0* 28.0* 31.1* 27.8*  MCV 105.8* 105.1* 106.5* 105.7* 106.5* 105.7*  PLT 278 266 270 265 281 256    CBG: Recent Labs  Lab 12/17/20 1123 12/17/20 1744 12/17/20 2306 12/18/20 0532 12/18/20 1130  GLUCAP 109* 128* 116* 107* 99    Recent Results (from the past 240 hour(s))  Resp Panel by RT-PCR (Flu A&B, Covid) Nasopharyngeal Swab     Status: None   Collection Time: 12/15/20 11:45 AM   Specimen: Nasopharyngeal  Swab; Nasopharyngeal(NP) swabs in vial transport medium  Result Value Ref Range Status   SARS Coronavirus 2 by RT PCR NEGATIVE NEGATIVE Final    Comment: (NOTE) SARS-CoV-2 target nucleic acids are NOT DETECTED.  The SARS-CoV-2 RNA is generally detectable in upper respiratory specimens during the acute phase of infection. The lowest concentration of SARS-CoV-2 viral copies this assay can detect is 138 copies/mL. A negative result does not preclude SARS-Cov-2 infection and should not be used as the sole basis for treatment or other patient management decisions. A negative result may occur with  improper specimen collection/handling, submission of specimen other than nasopharyngeal swab, presence of viral mutation(s) within the areas targeted by this assay, and inadequate number of viral copies(<138 copies/mL). A negative result must be combined with clinical observations, patient history, and epidemiological information. The expected result is Negative.  Fact Sheet for Patients:  EntrepreneurPulse.com.au  Fact Sheet for Healthcare Providers:  IncredibleEmployment.be  This test is no t yet approved or cleared by the [place] FDA and  has been authorized for detection and/or diagnosis of SARS-CoV-2 by FDA under an Emergency Use Authorization (EUA). This EUA will remain  in effect (meaning this test can be used) for the duration of the COVID-19 declaration under Section 564(b)(1) of the Act, 21 U.S.C.section 360bbb-3(b)(1), unless the authorization is terminated  or revoked sooner.       Influenza A by PCR NEGATIVE NEGATIVE Final   Influenza B by PCR NEGATIVE NEGATIVE Final    Comment: (NOTE) The Xpert Xpress SARS-CoV-2/FLU/RSV plus assay is intended as an aid in the diagnosis of influenza from Nasopharyngeal swab specimens and should not be used as a sole basis for treatment. Nasal washings and aspirates are unacceptable for Xpert Xpress  SARS-CoV-2/FLU/RSV testing.  Fact Sheet for Patients: EntrepreneurPulse.com.au  Fact Sheet for Healthcare Providers: IncredibleEmployment.be  This test is not yet approved or cleared by the [place] FDA and has been authorized for detection and/or diagnosis of SARS-CoV-2 by FDA under an Emergency Use Authorization (  EUA). This EUA will remain in effect (meaning this test can be used) for the duration of the COVID-19 declaration under Section 564(b)(1) of the Act, 21 U.S.C. section 360bbb-3(b)(1), unless the authorization is terminated or revoked.  Performed at [facility], [place] [address], [place], [place] [address]      Time spent in discharge (includes decision making & examination of pt): 35 minutes  12/18/2020, 12:26 PM   Cherene Altes, MD [facility] Office  [phone]

## 2020-12-18 NOTE — Progress Notes (Signed)
Chaplain engaged in an initial visit with [name].  [name] expressed that his wife [name] passed away four months ago.  [name] and [name] had been married over 57 years, meeting in college.  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  [name] describe [name] as really being heroic and an advocate for others.  She helped deliver approximately 4,000 babies.  [name] shared his reverence and love for [name].  He also noted that she was a great mother and grandmother.  [name] was affectionately called, "[name]" by her grandchildren.    [name] shared about his journey with grief from his wife's passing.  He noted that he wasn't doing well and that his community was able to recognize it right away.  [name] expressed a thankfulness for his "tribe."  Chaplain affirmed [name]'s grief journey.  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  He is able to spend time processing his wife's passing along with the siblings he lost during the height of COVID.    [name] confirmed that he wants to continue living.  He is not ready to give up.  [name] is facing many hard transitions but he is committed to getting through it.  He is hopeful about finding a new place to live so that he will not be alone and he is also looking forward to becoming a better grandfather.    Chaplain offered prayer with [name] around his grief and future.  Chaplain offered support, listening and presence.     12/18/20 1100  Clinical Encounter Type  Visited With Patient  Visit Type Initial;Spiritual support  Spiritual Encounters  Spiritual Needs Prayer;Grief support  Stress Factors  Patient Stress Factors Major life changes

## 2020-12-18 NOTE — Progress Notes (Signed)
Physical Therapy Treatment Patient Details Name: [name] MRN: [id number] DOB: September 17, 1940 Today's Date: 12/18/2020    History of Present Illness [name] is a 80 y.o. male with history of A. fib, hypertension who has had a knee surgery last month and admitted to hospital for GI bleed. Patient s/p EGD 7/16.    PT Comments    Pt ambulated in hallway with RW for a little more support.  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  Pt appears to have good active right knee flexion however extension limited.  Pt reports placing pillow under knee to assist with pain at night. Pt educated on right knee extension at rest and propping heel on towel roll or pillow and working up to 10 minutes.  Pt anticipates d/c home today.    Follow Up Recommendations  Outpatient PT     Equipment Recommendations  None recommended by PT    Recommendations for Other Services       Precautions / Restrictions Precautions Precautions: Fall;Knee Precaution Comments: reports no falls at home Restrictions RLE Weight Bearing: Weight bearing as tolerated    Mobility  Bed Mobility Overal bed mobility: Modified Independent                  Transfers Overall transfer level: Needs assistance Equipment used: Rolling walker (2 wheeled) Transfers: Sit to/from Stand Sit to Stand: Supervision            Ambulation/Gait Ambulation/Gait assistance: Min guard;Supervision Gait Distance (Feet): 140 Feet Assistive device: Rolling walker (2 wheeled) Gait Pattern/deviations: Step-through pattern;Decreased stride length;Trunk flexed     General Gait Details: cues for posture and position from [employer]             Wheelchair Mobility    Modified Rankin (Stroke Patients Only)       Balance                                            Cognition Arousal/Alertness: Awake/alert Behavior During Therapy: WFL for tasks  assessed/performed Overall Cognitive Status: Within Functional Limits for tasks assessed                                        Exercises Total Joint Exercises Quad Sets: AROM;Right;10 reps Knee Flexion: AROM;Right;10 reps;Seated Goniometric ROM: at least 120* right active knee flexion in sitting; pt lacking approx 4* extension    General Comments        Pertinent Vitals/Pain Pain Assessment: No/denies pain Pain Intervention(s): Monitored during session;Repositioned    Home Living                      Prior Function            PT Goals (current goals can now be found in the care plan section) Progress towards PT goals: Progressing toward goals    Frequency    Min 3X/week      PT Plan Current plan remains appropriate    Co-evaluation              AM-PAC PT "6 Clicks" Mobility   Outcome Measure  Help needed turning from your back to your side  while in a flat bed without using bedrails?: None Help needed moving from lying on your back to sitting on the side of a flat bed without using bedrails?: None Help needed moving to and from a bed to a chair (including a wheelchair)?: None Help needed standing up from a chair using your arms (e.g., wheelchair or bedside chair)?: None Help needed to walk in hospital room?: A Little Help needed climbing 3-5 steps with a railing? : A Little 6 Click Score: 22    End of Session Equipment Utilized During Treatment: Gait belt Activity Tolerance: Patient tolerated treatment well Patient left: in chair;with call bell/phone within reach;with family/visitor present;with nursing/sitter in room   PT Visit Diagnosis: Difficulty in walking, not elsewhere classified (R26.2)     Time: 0321-2248 PT Time Calculation (min) (ACUTE ONLY): 18 min  Charges:  $Gait Training: 8-22 mins                     Arlyce Dice, DPT Acute Rehabilitation Services Pager: [phone] Office: [phone]  York Ram  E 12/18/2020, 1:42 PM

## 2020-12-19 DIAGNOSIS — D508 Other iron deficiency anemias: Secondary | ICD-10-CM | POA: Diagnosis not present

## 2020-12-19 DIAGNOSIS — M25661 Stiffness of right knee, not elsewhere classified: Secondary | ICD-10-CM | POA: Diagnosis not present

## 2020-12-19 DIAGNOSIS — E782 Mixed hyperlipidemia: Secondary | ICD-10-CM | POA: Diagnosis not present

## 2020-12-19 DIAGNOSIS — K219 Gastro-esophageal reflux disease without esophagitis: Secondary | ICD-10-CM | POA: Diagnosis not present

## 2020-12-19 DIAGNOSIS — M1711 Unilateral primary osteoarthritis, right knee: Secondary | ICD-10-CM | POA: Diagnosis not present

## 2020-12-19 DIAGNOSIS — M179 Osteoarthritis of knee, unspecified: Secondary | ICD-10-CM | POA: Diagnosis not present

## 2020-12-19 DIAGNOSIS — I10 Essential (primary) hypertension: Secondary | ICD-10-CM | POA: Diagnosis not present

## 2020-12-19 DIAGNOSIS — N1831 Chronic kidney disease, stage 3a: Secondary | ICD-10-CM | POA: Diagnosis not present

## 2020-12-19 DIAGNOSIS — M6281 Muscle weakness (generalized): Secondary | ICD-10-CM | POA: Diagnosis not present

## 2020-12-19 DIAGNOSIS — Z96651 Presence of right artificial knee joint: Secondary | ICD-10-CM | POA: Diagnosis not present

## 2020-12-19 LAB — SURGICAL PATHOLOGY

## 2020-12-21 DIAGNOSIS — M6281 Muscle weakness (generalized): Secondary | ICD-10-CM | POA: Diagnosis not present

## 2020-12-21 DIAGNOSIS — M25661 Stiffness of right knee, not elsewhere classified: Secondary | ICD-10-CM | POA: Diagnosis not present

## 2020-12-21 DIAGNOSIS — Z96651 Presence of right artificial knee joint: Secondary | ICD-10-CM | POA: Diagnosis not present

## 2020-12-25 ENCOUNTER — Telehealth: Payer: Self-pay | Admitting: Neurology

## 2020-12-25 NOTE — Telephone Encounter (Signed)
Pt called and LVM stating that he was wanting to apologies to the provider for missing his last appt. on 7/18. He would also like to where does he go from here. Please advise.

## 2020-12-26 DIAGNOSIS — M25661 Stiffness of right knee, not elsewhere classified: Secondary | ICD-10-CM | POA: Diagnosis not present

## 2020-12-26 DIAGNOSIS — M6281 Muscle weakness (generalized): Secondary | ICD-10-CM | POA: Diagnosis not present

## 2020-12-26 DIAGNOSIS — Z96651 Presence of right artificial knee joint: Secondary | ICD-10-CM | POA: Diagnosis not present

## 2020-12-26 NOTE — Telephone Encounter (Signed)
I got him rescheduled with Dr. [name] and let him know we will keep refilling his meds until then. He asked me multiple times to please let Judson Roch know he is so sorry he missed his appointment with her, he was in the hospital during the time of the missed appointment.

## 2020-12-28 DIAGNOSIS — M6281 Muscle weakness (generalized): Secondary | ICD-10-CM | POA: Diagnosis not present

## 2020-12-28 DIAGNOSIS — Z96651 Presence of right artificial knee joint: Secondary | ICD-10-CM | POA: Diagnosis not present

## 2020-12-28 DIAGNOSIS — D62 Acute posthemorrhagic anemia: Secondary | ICD-10-CM | POA: Diagnosis not present

## 2020-12-28 DIAGNOSIS — K269 Duodenal ulcer, unspecified as acute or chronic, without hemorrhage or perforation: Secondary | ICD-10-CM | POA: Diagnosis not present

## 2020-12-28 DIAGNOSIS — N179 Acute kidney failure, unspecified: Secondary | ICD-10-CM | POA: Diagnosis not present

## 2020-12-28 DIAGNOSIS — M25661 Stiffness of right knee, not elsewhere classified: Secondary | ICD-10-CM | POA: Diagnosis not present

## 2021-01-02 DIAGNOSIS — M25661 Stiffness of right knee, not elsewhere classified: Secondary | ICD-10-CM | POA: Diagnosis not present

## 2021-01-02 DIAGNOSIS — Z96651 Presence of right artificial knee joint: Secondary | ICD-10-CM | POA: Diagnosis not present

## 2021-01-02 DIAGNOSIS — M6281 Muscle weakness (generalized): Secondary | ICD-10-CM | POA: Diagnosis not present

## 2021-01-04 DIAGNOSIS — Z96651 Presence of right artificial knee joint: Secondary | ICD-10-CM | POA: Diagnosis not present

## 2021-01-04 DIAGNOSIS — M6281 Muscle weakness (generalized): Secondary | ICD-10-CM | POA: Diagnosis not present

## 2021-01-04 DIAGNOSIS — M25661 Stiffness of right knee, not elsewhere classified: Secondary | ICD-10-CM | POA: Diagnosis not present

## 2021-01-09 DIAGNOSIS — M25661 Stiffness of right knee, not elsewhere classified: Secondary | ICD-10-CM | POA: Diagnosis not present

## 2021-01-09 DIAGNOSIS — Z96651 Presence of right artificial knee joint: Secondary | ICD-10-CM | POA: Diagnosis not present

## 2021-01-09 DIAGNOSIS — M6281 Muscle weakness (generalized): Secondary | ICD-10-CM | POA: Diagnosis not present

## 2021-01-11 DIAGNOSIS — Z96651 Presence of right artificial knee joint: Secondary | ICD-10-CM | POA: Diagnosis not present

## 2021-01-11 DIAGNOSIS — M6281 Muscle weakness (generalized): Secondary | ICD-10-CM | POA: Diagnosis not present

## 2021-01-11 DIAGNOSIS — M25661 Stiffness of right knee, not elsewhere classified: Secondary | ICD-10-CM | POA: Diagnosis not present

## 2021-01-16 DIAGNOSIS — M6281 Muscle weakness (generalized): Secondary | ICD-10-CM | POA: Diagnosis not present

## 2021-01-16 DIAGNOSIS — Z96651 Presence of right artificial knee joint: Secondary | ICD-10-CM | POA: Diagnosis not present

## 2021-01-16 DIAGNOSIS — M25661 Stiffness of right knee, not elsewhere classified: Secondary | ICD-10-CM | POA: Diagnosis not present

## 2021-01-18 DIAGNOSIS — M25661 Stiffness of right knee, not elsewhere classified: Secondary | ICD-10-CM | POA: Diagnosis not present

## 2021-01-18 DIAGNOSIS — M6281 Muscle weakness (generalized): Secondary | ICD-10-CM | POA: Diagnosis not present

## 2021-01-18 DIAGNOSIS — Z96651 Presence of right artificial knee joint: Secondary | ICD-10-CM | POA: Diagnosis not present

## 2021-01-23 DIAGNOSIS — M25661 Stiffness of right knee, not elsewhere classified: Secondary | ICD-10-CM | POA: Diagnosis not present

## 2021-01-23 DIAGNOSIS — M6281 Muscle weakness (generalized): Secondary | ICD-10-CM | POA: Diagnosis not present

## 2021-01-23 DIAGNOSIS — Z96651 Presence of right artificial knee joint: Secondary | ICD-10-CM | POA: Diagnosis not present

## 2021-01-25 DIAGNOSIS — M6281 Muscle weakness (generalized): Secondary | ICD-10-CM | POA: Diagnosis not present

## 2021-01-25 DIAGNOSIS — M25661 Stiffness of right knee, not elsewhere classified: Secondary | ICD-10-CM | POA: Diagnosis not present

## 2021-01-25 DIAGNOSIS — Z96651 Presence of right artificial knee joint: Secondary | ICD-10-CM | POA: Diagnosis not present

## 2021-01-29 DIAGNOSIS — I10 Essential (primary) hypertension: Secondary | ICD-10-CM | POA: Diagnosis not present

## 2021-01-29 DIAGNOSIS — D508 Other iron deficiency anemias: Secondary | ICD-10-CM | POA: Diagnosis not present

## 2021-01-29 DIAGNOSIS — M1711 Unilateral primary osteoarthritis, right knee: Secondary | ICD-10-CM | POA: Diagnosis not present

## 2021-01-29 DIAGNOSIS — M179 Osteoarthritis of knee, unspecified: Secondary | ICD-10-CM | POA: Diagnosis not present

## 2021-01-29 DIAGNOSIS — K219 Gastro-esophageal reflux disease without esophagitis: Secondary | ICD-10-CM | POA: Diagnosis not present

## 2021-01-29 DIAGNOSIS — N1831 Chronic kidney disease, stage 3a: Secondary | ICD-10-CM | POA: Diagnosis not present

## 2021-01-29 DIAGNOSIS — E782 Mixed hyperlipidemia: Secondary | ICD-10-CM | POA: Diagnosis not present

## 2021-01-30 DIAGNOSIS — M25661 Stiffness of right knee, not elsewhere classified: Secondary | ICD-10-CM | POA: Diagnosis not present

## 2021-01-30 DIAGNOSIS — M6281 Muscle weakness (generalized): Secondary | ICD-10-CM | POA: Diagnosis not present

## 2021-01-30 DIAGNOSIS — Z96651 Presence of right artificial knee joint: Secondary | ICD-10-CM | POA: Diagnosis not present

## 2021-02-02 DIAGNOSIS — Z96651 Presence of right artificial knee joint: Secondary | ICD-10-CM | POA: Diagnosis not present

## 2021-02-02 DIAGNOSIS — M6281 Muscle weakness (generalized): Secondary | ICD-10-CM | POA: Diagnosis not present

## 2021-02-02 DIAGNOSIS — M25661 Stiffness of right knee, not elsewhere classified: Secondary | ICD-10-CM | POA: Diagnosis not present

## 2021-02-08 DIAGNOSIS — Z96651 Presence of right artificial knee joint: Secondary | ICD-10-CM | POA: Diagnosis not present

## 2021-02-08 DIAGNOSIS — M6281 Muscle weakness (generalized): Secondary | ICD-10-CM | POA: Diagnosis not present

## 2021-02-08 DIAGNOSIS — M25661 Stiffness of right knee, not elsewhere classified: Secondary | ICD-10-CM | POA: Diagnosis not present

## 2021-02-12 ENCOUNTER — Ambulatory Visit: Payer: Medicare Other | Admitting: Neurology

## 2021-02-12 ENCOUNTER — Encounter: Payer: Self-pay | Admitting: Neurology

## 2021-02-12 VITALS — BP 118/72 | HR 102 | Ht 70.0 in | Wt 235.0 lb

## 2021-02-12 DIAGNOSIS — R269 Unspecified abnormalities of gait and mobility: Secondary | ICD-10-CM | POA: Diagnosis not present

## 2021-02-12 DIAGNOSIS — R202 Paresthesia of skin: Secondary | ICD-10-CM | POA: Diagnosis not present

## 2021-02-12 DIAGNOSIS — G2581 Restless legs syndrome: Secondary | ICD-10-CM | POA: Diagnosis not present

## 2021-02-12 NOTE — Progress Notes (Signed)
ASSESSMENT AND PLAN Restless leg syndrome  Over all stable  Continue Lyrica '100mg'$  3 tabs qhs.   Cymbalta '60mg'$  daily Requip 0.5 mg at bedtime Blood transfusion on September 18, 2020, with hemoglobin of 8.9 due to acute GI bleeding, duodenal ulcer  Worsening bilateral feet paresthesia  Length dependent sensory changes  Suggestive of peripheral neuropathy  EMG nerve conduction study  HISTORY OF PRESENT ILLNESS: [name] is a 80 yo RH male, accompanied by his wife, referred by Dr. Jacelyn Grip, and his primary care physician Dr. Leonides Schanz for evaluation of chronic low back pain, bilateral lower extremity muscle ache  He had a past medical history of hypertension, hyperlipidemia, presenting with chronic low back pain, bilateral lower extremity muscle achy pain since 2010.   Initially it was thought due to his statin treatment, he has tried different statin without improving his symptoms,over the years, he also received multiple lumbar bilateral facet joint fluoroscopy guided injection without improving his symptoms.   In 2012, he was diagnosed with L4 and 5 lumbar stenosis, had lumbar laminectomy L3-4, L4-L5 by Dr. [name] in July 2012,without improving his symptoms,  He was seen by different specialists, tried different medications, this including Elavil, Xanax as needed, he is currently taking hydrocodone 5/325 mg 1-2 tablets every night, Xanax 0.25 milligram as needed, Flexeril 10 mg every night, gabapentin 600 mg every night without significant improvement,  During the daytime, he denies significant low back pain, or bilateral lower extremity pain, he does has history of right ankle fracture, mild gait difficulty due to right ankle pain, but starting at evening time, he began to noticed deep achy pain starting from midline low back, radiating along bilateral lateral leg, constant, he tends to pace around, has difficulty sleeping at nighttime, sometimes he has to got up take a hot bath couple times every  night, put ice on his back, on his feet, only provide temporary relief.  He has also tried acupuncture, massage, TENS unit, without significant improvement, previously, he has a short trial of low-dose Requip, without improving his symptoms, no significant side effect noticed either.  He has bilateral lateral 3 toes numbness, no weakness, no bilateral fingertips numbness, or weakness  Per record, electrodiagnostic study failed to demonstrate significant etiology,  We have reviewed MRI lumbar in 2012, there was evidence of L4-5 spinal stenosis, and the most recent MRI  Lumbar was  in June 2014,progression of multilevel facet arthropathy, status post a laminectomy at L3-4, with residual large synovial cyst extending along the posterior epidural space without significant central canal stenosis, mild foraminal narrowing bilaterally at L3 and 4, progression of mild lateral recess narrowing bilaterally at L4-5, foraminal narrowing is stable at this level  MRI of the brain in June 2014 showed mild atrophy, no acute intracranial abnormality   UPDATE Dec 15th 2015:   He is tolerating gabapentin 300 mg 2 tablets every night, Requip 1 mg 2 tablets every night, he can sleep better with the medications, but continue complains of bilateral lower extremity dull achy pain, numbness, urgency to move, especially at nighttime, He also complains of slow progressive gait difficulty, grasps things for support, he has urinary urgency, but no incontinence.   UPDATE Jan 29th 2016: He is now taking gabapentin '300mg'$   2 tabs qhs and requip '1mg'$ , 3 tabs qhs, which has been very helpful,  30%, improvement, he still has bilatarel leg pain at evening, stretching,massage,  He denies bowel and bladder incontinence , he has tinlging at the  soles of his feet.    He started water aerobic,  He is taking less hydrocodone, 1/2 tab qhs,     UPDATE March 18th 2016; We have reviewed MRI scan of the cervical spine showing prominent  spondylitic changes from C4-C6 most noticeable at C5-6 where there is broad-based leftward disc osteophyte protrusion resulting in mild canal and moderate left-sided foraminal narrowing.   He continue complaining significant difficulty at nighttime, came in with a full page list of symptoms, in the morning time, he denies significant back pain, no low back pain, symptoms started in the late afternoon, gradually building up, around 9: to10 PM, he noticed achy feeling down lateral side of both leg, increased by sitting still, intense urge to lie on the floor, stretching muscle backwards, late night hot soaking bath to help him sleep, he has to take about 3 AM last night, difficulty sleeping, excessive fatigue during the daytime, could not sit through movies, standing up in the aisle 2/3 of the movie He is now taking, hydrocodone  5/'325mg'$  2 tabs 9pm, advil '200mg'$  2 tab    He has quit taking Requip 1 mg, up to 4 tablets every night, also gabapentin, complains of upset stomach, without relieving his symptoms, he is now taking Advil 200 mg 2 tablets as needed, hydrocodone/Tylenol 5/325 mg 2 tablets every night for symptoms control, which only mild, temporarily, Tylenol helps some too.   UPDATE April 29th 2016: Laboratory showed low iron, ferritin level at 9, he was put on iron supplement, his restless leg symptoms has much improved, He is now taking clonazepam 0.5 milligram every night, Lyrica 50 mg every night, he can sleep much better,   He continue, combat right ankle pain   UPDATE August 2nd 2016: He sleeps well, he is now taking clonazepam 0.'5mg'$  qhs, Lyrica '50mg'$  3 tabs qhs, no longer on  Hydrocodone, he complains of side effect   UPDATE August 06 2016: I reviewed operation record on April 18 2016, he had right lumbar L3-4, L4-5 anterolateral lumbar interbody fusion, percutaneous pedicle screw on April 18 2016, which has helped his left-sided low back pain radiating pain to left lower extremity, 4.  Of time he was almost pain-free, unfortunately he fell landed on his right side, is then he began to experience excruciating radiating pain from left side to left anterior thigh.   We have personally reviewed and compared to MRI lumbar in October 2017, and in February 2018. Which showed pedicle screw and interbody fusion at L3-4, L4-5 without stenosis, small fluid collection posterior to the thecal sac at L3-4, moderate right foraminal narrowing, mild foraminal narrowing at L4-5.   He is planning on to have second left-sided lumbar decompression surgery on August 15 2016   His restless leg symptoms is under good control with current dose of Lyrica 100 mg twice a day, clonazepam as needed, oxycodone 5 mg every night   His most recent ferritin level was 47, hemoglobin was 11 point 5,   UPDATE August 07 2017: He is accompanied by his wife at today's clinical visit, complaining worsening restless leg symptoms, for a while, his restless leg symptoms seems to be under okay control.   Since 2017, he had multiple surgery, lumbar fusion by Dr. Birdena Crandall November 2017, redo in March 2018, left hip replacement in November 2018,   He has baseline gait abnormality, but no longer have significant pain, at nighttime, 1-2 hours after lying down, he has uncontrollable right leg muscle jerking, urged to move,  He is now taking Lyrica 100 mg 2 tablets at nighttime, clonazepam 0.5 mg every night, often have to take 1-1/2 tablets of Percocet 5/325 mg early morning time if he could not sleep his combination medication treatment.  Sometimes he has to take hot bath in the middle of the night to help him go to sleep.   UPDATE Sept 9 2019: Now he is taking generic Lyric '100mg'$  3 tabs qhs, Requip 0.5 mg every night,  20% of time he is able to sleep without any difficulties, but 80% of the time about 1 to 2 hours after he goes to bed, he has severe leg discomfort, has to take a hot bath, then was able to sleep the rest of the  night, sometimes he took clonazepam 0.5 mg half to 1 tablets before bedtime, complains of excessive drowsiness even to the point of wetting his bed,     His ferritin level is low 15 ng/ml March 2019, last colonoscopy was in 2017, hemoglobin in November 2018 prior to his left hip surgery was 13, he is now on iron supplement   UPDATE August 10 2018: Ferritin in Sept 2019 was 49.  He continues to have restless leg symptoms, halftime in a week, he has trouble sleeping due to restless leg symptoms, to get up to take a hot shower, which usually is very helpful, is taking Lyrica 100 in the morning, 200 mg at 2 hours prior to sleep, also take iron supplement,   UPDATE August 11 2019: He has lost few dear family member over short period of time, is feeling sad.  Around February 2021, he felt like his restless leg symptoms is under good control, decided to taper down Lyrica doses, instead of 100 mg 3 tablets every night, he tapered down to 100 mg every night, gradually increased difficulty sleeping, difficult to find a comfortable position for his leg, take hot bath, clonazepam 0.5 mg as needed was helpful last night, he also takes Cymbalta 60 mg daily, Requip 0.5 mg at bedtime   Laboratory evaluations in December 2020, normal CBC hemoglobin 14.3, CMP, creatinine of 1.2, ferritin was 15 August 2017, after iron supplement, improved to 49   CT abdomen in December 2020 showed no acute abnormality, large paraesophageal hiatal hernia, with approximately one half of the stomach present in the chest, diffuse colonic diverticulosis, without evidence of acute diverticulitis, mild diffuse hepatic steatosis, marked prostate gland enlargement, with enhancing nodule arising from the superior portion of the median lobe of the prostate gland.   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 based on symptoms alone without looking at his skin lesion,   He denies a previous history of shingles, had a Shingrix vaccination end of 2020, did reported history of left upper thoracic rash, itching few months ago   His restless leg symptoms is under good control with current regimen, Lyrica 100 mg 3 tablets every night, Requip 0.5 mg every night, Cymbalta 60 mg every morning, clonazepam 0.5 mg as needed   He has history of chronic low back pain, more to left side, had a history of lumbar decompression  surgery in the past, left hip replacement in 2018, did have left hip injury within a week of replacement surgery, pain has gradually improved, also had a history of right ankle surgery, had gait abnormality.  UPDATE Sept 12 2022: He lost his wife in March 2022, going through grieving, had right knee replacement in June 2021, during the rehab, he was found to have dark stool, epigastric discomfort, using NSAIDs for right knee pain, also taking Eliquis for atrial fibrillation  Reviewed hospital record CT showed duodenitis, had active GI bleeding due to duodenal ulcer, hemoglobin of 8.9 required blood transfusion,  He is planning on moving to independent living, current medicine works well for his restless leg   He also complains of worsening bilateral feet numbness tingling  REVIEW OF SYSTEMS: Out of a complete 14 system review of symptoms, the patient complains only of the following symptoms, and all other reviewed systems are negative.  Restless legs  ALLERGIES: Allergies  Allergen Reactions   Compazine [Prochlorperazine Edisylate] Other (See Comments)    EXTRAPYRAMIDAL MOVEMENT [Involuntary muscle movement]    Penicillins Nausea And Vomiting, Rash and Other (See Comments)    Has patient had a PCN reaction causing immediate rash, facial/tongue/throat swelling, SOB or lightheadedness with hypotension: #  #  #  YES  #  #  #  Has patient had a PCN reaction causing severe rash involving mucus membranes or skin necrosis: No Has patient had a PCN reaction that required hospitalization No Has patient had a PCN reaction occurring within the last 10 years: No If all of the above answers are "NO", then may proceed with Cephalosporin use.    Welchol [Colesevelam Hcl] Other (See Comments)    MYALGIAS [Leg aches]   Gemfibrozil Nausea Only    PATIENT TOLERATES   Red Yeast Rice [Cholestin] Other (See Comments)    flushing    HOME MEDICATIONS: Outpatient Medications Prior to Visit  Medication Sig Dispense Refill   acetaminophen (TYLENOL) 325 MG tablet Take 2 tablets (650 mg total) by mouth every 6 (six) hours as needed for mild pain (or Fever >/= 101).     ARTIFICIAL TEAR OP Place 1 drop into both eyes daily as needed (dry eyes).     carvedilol (COREG) 3.125 MG tablet Take 3.125 mg by mouth at bedtime.      clonazePAM (KLONOPIN) 0.5 MG tablet take 1/2-1 TABLET BY MOUTH AT BEDTIME AS NEEDED 30 tablet 5   DULoxetine (CYMBALTA) 60 MG capsule Take 1 capsule (60 mg total) by mouth daily. 90 capsule 4   ELIQUIS 5 MG TABS tablet Take 1 tablet (5 mg total) by mouth 2 (two) times daily. 180 tablet 2   ezetimibe (ZETIA) 10 MG tablet Take 10 mg by mouth daily.     ferrous sulfate 324 (65 Fe) MG TBEC Take 324 mg by mouth daily.     Multiple Vitamins-Minerals (MULTIVITAMIN PO) Take 1 tablet by mouth daily.     nitroGLYCERIN (NITROSTAT) 0.4 MG SL tablet Place 0.4 mg under the tongue every 5 (five) minutes as needed for chest pain.     pantoprazole (PROTONIX) 40 MG tablet Take 1 tablet (40 mg total) by mouth 2 (two) times daily before a meal. 60 tablet 1   polyethylene glycol (MIRALAX / GLYCOLAX) packet Take 17 g by mouth  daily as needed for mild constipation.     pregabalin (LYRICA) 100 MG capsule TAKE THREE CAPSULES BY MOUTH EVERY EVENING 270 capsule 1   rOPINIRole (REQUIP) 0.5 MG  tablet Take 1 tablet (0.5 mg total) by mouth at bedtime. 30 tablet 11   rosuvastatin (CRESTOR) 10 MG tablet TAKE 1 TABLET ONCE DAILY OR AS DIRECTED. 90 tablet 2   tamsulosin (FLOMAX) 0.4 MG CAPS capsule Take 0.4 mg by mouth daily.     traMADol (ULTRAM) 50 MG tablet Take 1 tablet (50 mg total) by mouth every 6 (six) hours as needed for moderate pain. 20 tablet 0   oxyCODONE (OXY IR/ROXICODONE) 5 MG immediate release tablet Take 1-2 tablets (5-10 mg total) by mouth every 4 (four) hours as needed for severe pain. 20 tablet 0   No facility-administered medications prior to visit.    PAST MEDICAL HISTORY: Past Medical History:  Diagnosis Date   Anginal pain ([place])    NONE IN 3 YEARS   Arthritis    Atrial fibrillation ([place]) 1962   a. No recurrence since 1962.   Chest pain    a. 1998 Cath: nl cors;  b. 2009 Cath: nl cors.   Depression    Dysrhythmia    NO TROUBLE IN 1 YR    DR. PETER [place]    Esophageal reflux    Essential hypertension    History of Paroxysmal atrial flutter ([facility])    Hyperlipidemia    Incontinence of urine    Leg pain, bilateral    Nocturia    PONV (postoperative nausea and vomiting)    CONSTIPATED   Scarlet fever 1945   Spinal stenosis    Staph skin infection    LEFT GROIN  04/11/16  [place] W/ DOXYCYCLINE   Ulcerative proctitis ([place])     PAST SURGICAL HISTORY: Past Surgical History:  Procedure Laterality Date   ANKLE RECONSTRUCTION Right 2007   [place]   ANTERIOR LAT LUMBAR FUSION Right 04/18/2016   Procedure: RIGHT LUMBAR THREE-FOUR, LUMBAR FOUR-FIVE ANTEROLATERAL LUMBAR INTERBODY FUSION;  Surgeon: Erline Levine, MD;  Location: [facility];  Service: Neurosurgery;  Laterality: Right;  RIGHT L3-4 L4-5 ANTEROLATERAL LUMBAR INTERBODY FUSION   BIOPSY  12/16/2020   Procedure: BIOPSY;  Surgeon: Ronnette Juniper, MD;   Location: [facility];  Service: Gastroenterology;;   CARDIAC CATHETERIZATION  03/03/2008   Left heart cardiac catheterization and coronary   CARDIAC CATHETERIZATION  1997   Dr Wynonia Lawman   CATARACT EXTRACTION W/ INTRAOCULAR LENS  IMPLANT, BILATERAL     2016   ESOPHAGEAL DILATION     2008   ESOPHAGOGASTRODUODENOSCOPY N/A 12/16/2020   Procedure: ESOPHAGOGASTRODUODENOSCOPY (EGD);  Surgeon: Ronnette Juniper, MD;  Location: [facility];  Service: Gastroenterology;  Laterality: N/A;   INNER EAR SURGERY     EAR INJECTION FOR MENIERES   KNEE ARTHROSCOPY  1991, 1995   LUMBAR LAMINECTOMY  12/2010   LUMBAR LAMINECTOMY/DECOMPRESSION MICRODISCECTOMY Left 08/15/2016   Procedure: Left Left three- four Redo laminectomy;  Surgeon: Erline Levine, MD;  Location: [facility];  Service: Neurosurgery;  Laterality: Left;  Left L3-4 Redo laminectomy   LUMBAR PERCUTANEOUS PEDICLE SCREW 2 LEVEL N/A 04/18/2016   Procedure: LUMBAR THREE-FOUR, LUMBAR FOUR-FIVE PERCUTANEOUS PEDICLE SCREW;  Surgeon: Erline Levine, MD;  Location: [facility];  Service: Neurosurgery;  Laterality: N/A;   TOTAL HIP ARTHROPLASTY Left 04/22/2017   TOTAL HIP ARTHROPLASTY Left 04/22/2017   Procedure: TOTAL HIP ARTHROPLASTY ANTERIOR APPROACH;  Surgeon: Melrose Nakayama, MD;  Location: [facility];  Service: Orthopedics;  Laterality: Left;   TOTAL KNEE ARTHROPLASTY Right 11/21/2020   Procedure: RIGHT TOTAL KNEE ARTHROPLASTY;  Surgeon: Melrose Nakayama, MD;  Location: [facility];  Service: Orthopedics;  Laterality: Right;  FAMILY HISTORY: Family History  Problem Relation Age of Onset   Prostate cancer Father    Heart attack Brother    Kidney cancer Brother    Brain cancer Brother     SOCIAL HISTORY: Social History   Socioeconomic History   Marital status: Widowed    Spouse name: Bethena Roys   Number of children: 3   Years of education: college   Highest education level: Not on file  Occupational History    Comment: Retired   Tobacco Use   Smoking status: Some Days     Types: Pipe   Smokeless tobacco: Never  Vaping Use   Vaping Use: Never used  Substance and Sexual Activity   Alcohol use: Yes    Alcohol/week: 1.0 standard drink    Types: 1 Shots of liquor per week    Comment: occasionally   Drug use: No   Sexual activity: Not on file  Other Topics Concern   Not on file  Social History Narrative   Patient is retired and lives at home with his wife Bethena Roys.    Education college.   Caffeine one cup daily.   Social Determinants of Health   Financial Resource Strain: Not on file  Food Insecurity: Not on file  Transportation Needs: Not on file  Physical Activity: Not on file  Stress: Not on file  Social Connections: Not on file  Intimate Partner Violence: Not on file   PHYSICAL EXAM  Vitals:   02/12/21 0852  BP: 118/72  Pulse: (!) 102  Weight: 235 lb (106.6 kg)  Height: '5\' 10"'$  (1.778 m)   Body mass index is 33.72 kg/m.   PHYSICAL EXAMNIATION:  Gen: NAD, conversant, well nourised, well groomed                     Cardiovascular: Regular rate rhythm, no peripheral edema, warm, nontender. Eyes: Conjunctivae clear without exudates or hemorrhage Neck: Supple, no carotid bruits. Pulmonary: Clear to auscultation bilaterally   NEUROLOGICAL EXAM:  MENTAL STATUS: Speech/Cognition: Awake, alert, normal speech, oriented to history taking and casual conversation.  CRANIAL NERVES: CN II: Visual fields are full to confrontation.  Pupils are round equal and briskly reactive to light. CN III, IV, VI: extraocular movement are normal. No ptosis. CN V: Facial sensation is intact to light touch. CN VII: Face is symmetric with normal eye closure and smile. CN VIII: Hearing is normal to casual conversation CN IX, X: Palate elevates symmetrically. Phonation is normal. CN XI: Head turning and shoulder shrug are intact CN XII: Tongue is midline with normal movements and no atrophy.  MOTOR: Muscle bulk and tone are normal. Muscle strength is  normal.  REFLEXES: Reflexes are 1  and symmetric at the biceps, triceps, knees and ankles. Plantar responses are flexor.  SENSORY: Length dependent decreased light touch, pinprick to mid shin level  COORDINATION: There is no trunk or limb ataxia.    GAIT/STANCE: Need push-up to get up from seated position, antalgic  DIAGNOSTIC DATA (LABS, IMAGING, TESTING) - I reviewed patient records, labs, notes, testing and imaging myself where available.  Lab Results  Component Value Date   WBC 5.4 12/18/2020   HGB 8.9 (L) 12/18/2020   HCT 27.8 (L) 12/18/2020   MCV 105.7 (H) 12/18/2020   PLT 256 12/18/2020      Component Value Date/Time   NA 136 12/18/2020 0442   NA 138 04/29/2016 0000   K 3.9 12/18/2020 0442   CL 103 12/18/2020 0442  CO2 27 12/18/2020 0442   GLUCOSE 104 (H) 12/18/2020 0442   BUN 23 12/18/2020 0442   BUN 19 04/29/2016 0000   CREATININE 1.27 (H) 12/18/2020 0442   CALCIUM 8.2 (L) 12/18/2020 0442   PROT 6.0 (L) 12/17/2020 0544   PROT 6.3 01/13/2017 0846   ALBUMIN 3.7 12/17/2020 0544   ALBUMIN 4.4 01/13/2017 0846   AST 24 12/17/2020 0544   ALT 18 12/17/2020 0544   ALKPHOS 58 12/17/2020 0544   BILITOT 0.8 12/17/2020 0544   BILITOT 0.4 01/13/2017 0846   GFRNONAA 56 (L) 12/18/2020 0442   GFRAA 59 (L) 04/14/2017 1000   Lab Results  Component Value Date   CHOL 90 (L) 01/13/2017   HDL 29 (L) 01/13/2017   LDLCALC 33 01/13/2017   TRIG 138 01/13/2017   CHOLHDL 3.8 01/18/2016   No results found for: HGBA1C Lab Results  Component Value Date   [id number] 12/17/2020   Lab Results  Component Value Date   TSH 2.723 12/17/2020

## 2021-02-28 DIAGNOSIS — Z0000 Encounter for general adult medical examination without abnormal findings: Secondary | ICD-10-CM | POA: Diagnosis not present

## 2021-02-28 DIAGNOSIS — Z96651 Presence of right artificial knee joint: Secondary | ICD-10-CM | POA: Diagnosis not present

## 2021-03-01 ENCOUNTER — Other Ambulatory Visit: Payer: Self-pay | Admitting: Neurology

## 2021-03-01 DIAGNOSIS — K219 Gastro-esophageal reflux disease without esophagitis: Secondary | ICD-10-CM | POA: Diagnosis not present

## 2021-03-01 DIAGNOSIS — N1831 Chronic kidney disease, stage 3a: Secondary | ICD-10-CM | POA: Diagnosis not present

## 2021-03-01 DIAGNOSIS — I10 Essential (primary) hypertension: Secondary | ICD-10-CM | POA: Diagnosis not present

## 2021-03-01 DIAGNOSIS — E782 Mixed hyperlipidemia: Secondary | ICD-10-CM | POA: Diagnosis not present

## 2021-03-08 DIAGNOSIS — E782 Mixed hyperlipidemia: Secondary | ICD-10-CM | POA: Diagnosis not present

## 2021-03-08 DIAGNOSIS — D62 Acute posthemorrhagic anemia: Secondary | ICD-10-CM | POA: Diagnosis not present

## 2021-03-08 DIAGNOSIS — Z79899 Other long term (current) drug therapy: Secondary | ICD-10-CM | POA: Diagnosis not present

## 2021-03-08 DIAGNOSIS — D7589 Other specified diseases of blood and blood-forming organs: Secondary | ICD-10-CM | POA: Diagnosis not present

## 2021-03-12 DIAGNOSIS — D509 Iron deficiency anemia, unspecified: Secondary | ICD-10-CM | POA: Diagnosis not present

## 2021-03-12 DIAGNOSIS — N1831 Chronic kidney disease, stage 3a: Secondary | ICD-10-CM | POA: Diagnosis not present

## 2021-03-12 DIAGNOSIS — I10 Essential (primary) hypertension: Secondary | ICD-10-CM | POA: Diagnosis not present

## 2021-03-12 DIAGNOSIS — K5900 Constipation, unspecified: Secondary | ICD-10-CM | POA: Diagnosis not present

## 2021-03-12 DIAGNOSIS — M179 Osteoarthritis of knee, unspecified: Secondary | ICD-10-CM | POA: Diagnosis not present

## 2021-03-12 DIAGNOSIS — D6869 Other thrombophilia: Secondary | ICD-10-CM | POA: Diagnosis not present

## 2021-03-12 DIAGNOSIS — M5451 Vertebrogenic low back pain: Secondary | ICD-10-CM | POA: Diagnosis not present

## 2021-03-12 DIAGNOSIS — I4819 Other persistent atrial fibrillation: Secondary | ICD-10-CM | POA: Diagnosis not present

## 2021-03-12 DIAGNOSIS — E782 Mixed hyperlipidemia: Secondary | ICD-10-CM | POA: Diagnosis not present

## 2021-03-12 DIAGNOSIS — K269 Duodenal ulcer, unspecified as acute or chronic, without hemorrhage or perforation: Secondary | ICD-10-CM | POA: Diagnosis not present

## 2021-04-02 ENCOUNTER — Other Ambulatory Visit: Payer: Self-pay | Admitting: Pharmacist

## 2021-04-02 MED ORDER — ELIQUIS 5 MG PO TABS: 5.0000 mg | ORAL_TABLET | Freq: Two times a day (BID) | ORAL | 1 refills | Status: DC

## 2021-04-25 ENCOUNTER — Ambulatory Visit (INDEPENDENT_AMBULATORY_CARE_PROVIDER_SITE_OTHER): Payer: Medicare Other | Admitting: Neurology

## 2021-04-25 ENCOUNTER — Ambulatory Visit: Payer: Medicare Other | Admitting: Neurology

## 2021-04-25 ENCOUNTER — Other Ambulatory Visit: Payer: Self-pay

## 2021-04-25 DIAGNOSIS — E538 Deficiency of other specified B group vitamins: Secondary | ICD-10-CM | POA: Diagnosis not present

## 2021-04-25 DIAGNOSIS — R760 Raised antibody titer: Secondary | ICD-10-CM | POA: Diagnosis not present

## 2021-04-25 DIAGNOSIS — R269 Unspecified abnormalities of gait and mobility: Secondary | ICD-10-CM

## 2021-04-25 DIAGNOSIS — R202 Paresthesia of skin: Secondary | ICD-10-CM | POA: Diagnosis not present

## 2021-04-25 DIAGNOSIS — G6289 Other specified polyneuropathies: Secondary | ICD-10-CM | POA: Diagnosis not present

## 2021-04-25 DIAGNOSIS — R799 Abnormal finding of blood chemistry, unspecified: Secondary | ICD-10-CM | POA: Diagnosis not present

## 2021-04-25 DIAGNOSIS — G2581 Restless legs syndrome: Secondary | ICD-10-CM | POA: Diagnosis not present

## 2021-04-25 DIAGNOSIS — R7309 Other abnormal glucose: Secondary | ICD-10-CM | POA: Diagnosis not present

## 2021-04-25 DIAGNOSIS — G629 Polyneuropathy, unspecified: Secondary | ICD-10-CM | POA: Insufficient documentation

## 2021-04-25 MED ORDER — DULOXETINE HCL 60 MG PO CPEP: 60.0000 mg | ORAL_CAPSULE | Freq: Every day | ORAL | 4 refills | Status: DC

## 2021-04-25 NOTE — Progress Notes (Signed)
ASSESSMENT AND PLAN Restless leg syndrome  Over all stable  Continue Lyrica 100mg  3 tabs qhs.   Cymbalta 60mg  daily Requip 0.5 mg at bedtime Blood transfusion on September 18, 2020, with hemoglobin of 8.9 due to acute GI bleeding, duodenal ulcer  Peripheral neuropathy  Confirmed by EMG nerve conduction study today, moderate axonal sensorimotor polyneuropathy  Laboratory evaluation for possible diagnosis  Anemia  Labs including ferritin level  HISTORY OF PRESENT ILLNESS: [name] is a 80 yo RH male, accompanied by his wife, referred by Dr. Jacelyn Grip, and his primary care physician Dr. Leonides Schanz for evaluation of chronic low back pain, bilateral lower extremity muscle ache  He had a past medical history of hypertension, hyperlipidemia, presenting with chronic low back pain, bilateral lower extremity muscle achy pain since 2010.   Initially it was thought due to his statin treatment, he has tried different statin without improving his symptoms,over the years, he also received multiple lumbar bilateral facet joint fluoroscopy guided injection without improving his symptoms.   In 2012, he was diagnosed with L4 and 5 lumbar stenosis, had lumbar laminectomy L3-4, L4-L5 by Dr. [name] in July 2012,without improving his symptoms,  He was seen by different specialists, tried different medications, this including Elavil, Xanax as needed, he is currently taking hydrocodone 5/325 mg 1-2 tablets every night, Xanax 0.25 milligram as needed, Flexeril 10 mg every night, gabapentin 600 mg every night without significant improvement,  During the daytime, he denies significant low back pain, or bilateral lower extremity pain, he does has history of right ankle fracture, mild gait difficulty due to right ankle pain, but starting at evening time, he began to noticed deep achy pain starting from midline low back, radiating along bilateral lateral leg, constant, he tends to pace around, has difficulty sleeping at  nighttime, sometimes he has to got up take a hot bath couple times every night, put ice on his back, on his feet, only provide temporary relief.  He has also tried acupuncture, massage, TENS unit, without significant improvement, previously, he has a short trial of low-dose Requip, without improving his symptoms, no significant side effect noticed either.  He has bilateral lateral 3 toes numbness, no weakness, no bilateral fingertips numbness, or weakness  Per record, electrodiagnostic study failed to demonstrate significant etiology,  We have reviewed MRI lumbar in 2012, there was evidence of L4-5 spinal stenosis, and the most recent MRI  Lumbar was  in June 2014,progression of multilevel facet arthropathy, status post a laminectomy at L3-4, with residual large synovial cyst extending along the posterior epidural space without significant central canal stenosis, mild foraminal narrowing bilaterally at L3 and 4, progression of mild lateral recess narrowing bilaterally at L4-5, foraminal narrowing is stable at this level  MRI of the brain in June 2014 showed mild atrophy, no acute intracranial abnormality   UPDATE Dec 15th 2015:   He is tolerating gabapentin 300 mg 2 tablets every night, Requip 1 mg 2 tablets every night, he can sleep better with the medications, but continue complains of bilateral lower extremity dull achy pain, numbness, urgency to move, especially at nighttime, He also complains of slow progressive gait difficulty, grasps things for support, he has urinary urgency, but no incontinence.   UPDATE Jan 29th 2016: He is now taking gabapentin 300mg   2 tabs qhs and requip 1mg , 3 tabs qhs, which has been very helpful,  30%, improvement, he still has bilatarel leg pain at evening, stretching,massage,  He denies bowel and  bladder incontinence , he has tinlging at the soles of his feet.    He started water aerobic,  He is taking less hydrocodone, 1/2 tab qhs,     UPDATE March 18th 2016; We  have reviewed MRI scan of the cervical spine showing prominent spondylitic changes from C4-C6 most noticeable at C5-6 where there is broad-based leftward disc osteophyte protrusion resulting in mild canal and moderate left-sided foraminal narrowing.   He continue complaining significant difficulty at nighttime, came in with a full page list of symptoms, in the morning time, he denies significant back pain, no low back pain, symptoms started in the late afternoon, gradually building up, around 9: to10 PM, he noticed achy feeling down lateral side of both leg, increased by sitting still, intense urge to lie on the floor, stretching muscle backwards, late night hot soaking bath to help him sleep, he has to take about 3 AM last night, difficulty sleeping, excessive fatigue during the daytime, could not sit through movies, standing up in the aisle 2/3 of the movie He is now taking, hydrocodone  5/325mg  2 tabs 9pm, advil 200mg  2 tab    He has quit taking Requip 1 mg, up to 4 tablets every night, also gabapentin, complains of upset stomach, without relieving his symptoms, he is now taking Advil 200 mg 2 tablets as needed, hydrocodone/Tylenol 5/325 mg 2 tablets every night for symptoms control, which only mild, temporarily, Tylenol helps some too.   UPDATE April 29th 2016: Laboratory showed low iron, ferritin level at 9, he was put on iron supplement, his restless leg symptoms has much improved, He is now taking clonazepam 0.5 milligram every night, Lyrica 50 mg every night, he can sleep much better,   He continue, combat right ankle pain   UPDATE August 2nd 2016: He sleeps well, he is now taking clonazepam 0.5mg  qhs, Lyrica 50mg  3 tabs qhs, no longer on  Hydrocodone, he complains of side effect   UPDATE August 06 2016: I reviewed operation record on April 18 2016, he had right lumbar L3-4, L4-5 anterolateral lumbar interbody fusion, percutaneous pedicle screw on April 18 2016, which has helped his  left-sided low back pain radiating pain to left lower extremity, 4. Of time he was almost pain-free, unfortunately he fell landed on his right side, is then he began to experience excruciating radiating pain from left side to left anterior thigh.   We have personally reviewed and compared to MRI lumbar in October 2017, and in February 2018. Which showed pedicle screw and interbody fusion at L3-4, L4-5 without stenosis, small fluid collection posterior to the thecal sac at L3-4, moderate right foraminal narrowing, mild foraminal narrowing at L4-5.   He is planning on to have second left-sided lumbar decompression surgery on August 15 2016   His restless leg symptoms is under good control with current dose of Lyrica 100 mg twice a day, clonazepam as needed, oxycodone 5 mg every night   His most recent ferritin level was 47, hemoglobin was 11 point 5,   UPDATE August 07 2017: He is accompanied by his wife at today's clinical visit, complaining worsening restless leg symptoms, for a while, his restless leg symptoms seems to be under okay control.   Since 2017, he had multiple surgery, lumbar fusion by Dr. Birdena Crandall November 2017, redo in March 2018, left hip replacement in November 2018,   He has baseline gait abnormality, but no longer have significant pain, at nighttime, 1-2 hours after lying down, he has  uncontrollable right leg muscle jerking, urged to move,   He is now taking Lyrica 100 mg 2 tablets at nighttime, clonazepam 0.5 mg every night, often have to take 1-1/2 tablets of Percocet 5/325 mg early morning time if he could not sleep his combination medication treatment.  Sometimes he has to take hot bath in the middle of the night to help him go to sleep.   UPDATE Sept 9 2019: Now he is taking generic Lyric 100mg  3 tabs qhs, Requip 0.5 mg every night,  20% of time he is able to sleep without any difficulties, but 80% of the time about 1 to 2 hours after he goes to bed, he has severe leg  discomfort, has to take a hot bath, then was able to sleep the rest of the night, sometimes he took clonazepam 0.5 mg half to 1 tablets before bedtime, complains of excessive drowsiness even to the point of wetting his bed,     His ferritin level is low 15 ng/ml March 2019, last colonoscopy was in 2017, hemoglobin in November 2018 prior to his left hip surgery was 13, he is now on iron supplement   UPDATE August 10 2018: Ferritin in Sept 2019 was 49.  He continues to have restless leg symptoms, halftime in a week, he has trouble sleeping due to restless leg symptoms, to get up to take a hot shower, which usually is very helpful, is taking Lyrica 100 in the morning, 200 mg at 2 hours prior to sleep, also take iron supplement,   UPDATE August 11 2019: He has lost few dear family member over short period of time, is feeling sad.  Around February 2021, he felt like his restless leg symptoms is under good control, decided to taper down Lyrica doses, instead of 100 mg 3 tablets every night, he tapered down to 100 mg every night, gradually increased difficulty sleeping, difficult to find a comfortable position for his leg, take hot bath, clonazepam 0.5 mg as needed was helpful last night, he also takes Cymbalta 60 mg daily, Requip 0.5 mg at bedtime   Laboratory evaluations in December 2020, normal CBC hemoglobin 14.3, CMP, creatinine of 1.2, ferritin was 15 August 2017, after iron supplement, improved to 49   CT abdomen in December 2020 showed no acute abnormality, large paraesophageal hiatal hernia, with approximately one half of the stomach present in the chest, diffuse colonic diverticulosis, without evidence of acute diverticulitis, mild diffuse hepatic steatosis, marked prostate gland enlargement, with enhancing nodule arising from the superior portion of the median lobe of the prostate gland.   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 based on symptoms alone without looking at his skin lesion,   He denies a previous history of shingles, had a Shingrix vaccination end of 2020, did reported history of left upper thoracic rash, itching few months ago   His restless leg symptoms is under good control with current regimen, Lyrica 100 mg 3 tablets every night, Requip 0.5 mg every night, Cymbalta 60 mg every morning, clonazepam 0.5 mg as needed   He has history of chronic low back pain,  more to left side, had a history of lumbar decompression surgery in the past, left hip replacement in 2018, did have left hip injury within a week of replacement surgery, pain has gradually improved, also had a history of right ankle surgery, had gait abnormality.  UPDATE Sept 12 2022: He lost his wife in March 2022, going through grieving, had right knee replacement in June 2021, during the rehab, he was found to have dark stool, epigastric discomfort, using NSAIDs for right knee pain, also taking Eliquis for atrial fibrillation  Reviewed hospital record CT showed duodenitis, had active GI bleeding due to duodenal ulcer, hemoglobin of 8.9 required blood transfusion,  He is planning on moving to independent living, current medicine works well for his restless leg  He also complains of worsening bilateral feet numbness tingling  Update April 25, 2021: His restless leg symptoms overall is under good control with his current medications, Lyrica 100 mg 3 tablets every night, clonazepam as needed, he rarely uses it, Cymbalta 60 mg every day,  He return for electrodiagnostic study today, which did show  evidence of moderate axonal peripheral neuropathy, in addition there is evidence of chronic bilateral lumbosacral radiculopathy.  Reviewed laboratory evaluation in July 2022, when he was admitted to the hospital for GI bleeding, hemoglobin was 8.9, normal TSH, BMP, with creatinine of 1.27 calcium was 8.2, GFR of 56  REVIEW OF SYSTEMS: Out of a complete 14 system review of symptoms, the patient complains only of the following symptoms, and all other reviewed systems are negative.  Restless legs  ALLERGIES: Allergies  Allergen Reactions   Compazine [Prochlorperazine Edisylate] Other (See Comments)    EXTRAPYRAMIDAL MOVEMENT [Involuntary muscle movement]   Penicillins Nausea And Vomiting, Rash and Other (See Comments)    Has patient had a PCN reaction causing immediate rash, facial/tongue/throat swelling, SOB or lightheadedness with hypotension: #  #  #  YES  #  #  #  Has patient had a PCN reaction causing severe rash involving mucus membranes or skin necrosis: No Has patient had a PCN reaction that required hospitalization No Has patient had a PCN reaction occurring within the last 10 years: No If all of the above answers are "NO", then may proceed with Cephalosporin use.    Welchol [Colesevelam Hcl] Other (See Comments)    MYALGIAS [Leg aches]   Gemfibrozil Nausea Only    PATIENT TOLERATES   Red Yeast Rice [Cholestin] Other (See Comments)    flushing    HOME MEDICATIONS: Outpatient Medications Prior to Visit  Medication Sig Dispense Refill   pregabalin (LYRICA) 100 MG capsule TAKE THREE CAPSULES BY MOUTH EVERY EVENING 270 capsule 1   acetaminophen (TYLENOL) 325 MG tablet Take 2 tablets (650 mg total) by mouth every 6 (six) hours as needed for mild pain (or Fever >/= 101).     ARTIFICIAL TEAR OP Place 1 drop into both eyes daily as needed (dry eyes).     carvedilol (COREG) 3.125 MG tablet Take 3.125 mg by mouth at bedtime.      clonazePAM (KLONOPIN) 0.5 MG tablet take 1/2-1 TABLET BY  MOUTH AT BEDTIME AS NEEDED 30 tablet 5   DULoxetine (CYMBALTA) 60 MG capsule Take 1 capsule (60 mg total) by mouth daily. 90 capsule 4   ELIQUIS 5 MG TABS tablet Take 1 tablet (5 mg total) by mouth 2 (two) times daily. 180 tablet 1   ezetimibe (ZETIA) 10 MG tablet Take 10 mg by mouth daily.     ferrous  sulfate 324 (65 Fe) MG TBEC Take 324 mg by mouth daily.     Multiple Vitamins-Minerals (MULTIVITAMIN PO) Take 1 tablet by mouth daily.     nitroGLYCERIN (NITROSTAT) 0.4 MG SL tablet Place 0.4 mg under the tongue every 5 (five) minutes as needed for chest pain.     pantoprazole (PROTONIX) 40 MG tablet Take 1 tablet (40 mg total) by mouth 2 (two) times daily before a meal. 60 tablet 1   polyethylene glycol (MIRALAX / GLYCOLAX) packet Take 17 g by mouth daily as needed for mild constipation.     rOPINIRole (REQUIP) 0.5 MG tablet Take 1 tablet (0.5 mg total) by mouth at bedtime. 30 tablet 11   rosuvastatin (CRESTOR) 10 MG tablet TAKE 1 TABLET ONCE DAILY OR AS DIRECTED. 90 tablet 2   tamsulosin (FLOMAX) 0.4 MG CAPS capsule Take 0.4 mg by mouth daily.     traMADol (ULTRAM) 50 MG tablet Take 1 tablet (50 mg total) by mouth every 6 (six) hours as needed for moderate pain. 20 tablet 0   No facility-administered medications prior to visit.    PAST MEDICAL HISTORY: Past Medical History:  Diagnosis Date   Anginal pain ([place])    NONE IN 3 YEARS   Arthritis    Atrial fibrillation ([place]) 1962   a. No recurrence since 1962.   Chest pain    a. 1998 Cath: nl cors;  b. 2009 Cath: nl cors.   Depression    Dysrhythmia    NO TROUBLE IN 1 YR    DR. PETER [place]    Esophageal reflux    Essential hypertension    History of Paroxysmal atrial flutter ([facility])    Hyperlipidemia    Incontinence of urine    Leg pain, bilateral    Nocturia    PONV (postoperative nausea and vomiting)    CONSTIPATED   Scarlet fever 1945   Spinal stenosis    Staph skin infection    LEFT GROIN  04/11/16  TX W/ DOXYCYCLINE    Ulcerative proctitis ([place])     PAST SURGICAL HISTORY: Past Surgical History:  Procedure Laterality Date   ANKLE RECONSTRUCTION Right 2007   [place]   ANTERIOR LAT LUMBAR FUSION Right 04/18/2016   Procedure: RIGHT LUMBAR THREE-FOUR, LUMBAR FOUR-FIVE ANTEROLATERAL LUMBAR INTERBODY FUSION;  Surgeon: Erline Levine, MD;  Location: [facility];  Service: Neurosurgery;  Laterality: Right;  RIGHT L3-4 L4-5 ANTEROLATERAL LUMBAR INTERBODY FUSION   BIOPSY  12/16/2020   Procedure: BIOPSY;  Surgeon: Ronnette Juniper, MD;  Location: [facility];  Service: Gastroenterology;;   CARDIAC CATHETERIZATION  03/03/2008   Left heart cardiac catheterization and coronary   CARDIAC CATHETERIZATION  1997   Dr Wynonia Lawman   CATARACT EXTRACTION W/ INTRAOCULAR LENS  IMPLANT, BILATERAL     2016   ESOPHAGEAL DILATION     2008   ESOPHAGOGASTRODUODENOSCOPY N/A 12/16/2020   Procedure: ESOPHAGOGASTRODUODENOSCOPY (EGD);  Surgeon: Ronnette Juniper, MD;  Location: [facility];  Service: Gastroenterology;  Laterality: N/A;   INNER EAR SURGERY     EAR INJECTION FOR MENIERES   KNEE ARTHROSCOPY  1991, 1995   LUMBAR LAMINECTOMY  12/2010   LUMBAR LAMINECTOMY/DECOMPRESSION MICRODISCECTOMY Left 08/15/2016   Procedure: Left Left three- four Redo laminectomy;  Surgeon: Erline Levine, MD;  Location: [place];  Service: Neurosurgery;  Laterality: Left;  Left L3-4 Redo laminectomy   LUMBAR PERCUTANEOUS PEDICLE SCREW 2 LEVEL N/A 04/18/2016   Procedure: LUMBAR THREE-FOUR, LUMBAR FOUR-FIVE PERCUTANEOUS PEDICLE SCREW;  Surgeon: Erline Levine, MD;  Location:  [facility];  Service: Neurosurgery;  Laterality: N/A;   TOTAL HIP ARTHROPLASTY Left 04/22/2017   TOTAL HIP ARTHROPLASTY Left 04/22/2017   Procedure: TOTAL HIP ARTHROPLASTY ANTERIOR APPROACH;  Surgeon: Melrose Nakayama, MD;  Location: [facility];  Service: Orthopedics;  Laterality: Left;   TOTAL KNEE ARTHROPLASTY Right 11/21/2020   Procedure: RIGHT TOTAL KNEE ARTHROPLASTY;  Surgeon: Melrose Nakayama, MD;  Location: [facility];   Service: Orthopedics;  Laterality: Right;    FAMILY HISTORY: Family History  Problem Relation Age of Onset   Prostate cancer Father    Heart attack Brother    Kidney cancer Brother    Brain cancer Brother     SOCIAL HISTORY: Social History   Socioeconomic History   Marital status: Widowed    Spouse name: Bethena Roys   Number of children: 3   Years of education: college   Highest education level: Not on file  Occupational History    Comment: Retired   Tobacco Use   Smoking status: Some Days    Types: Pipe   Smokeless tobacco: Never  Vaping Use   Vaping Use: Never used  Substance and Sexual Activity   Alcohol use: Yes    Alcohol/week: 1.0 standard drink    Types: 1 Shots of liquor per week    Comment: occasionally   Drug use: No   Sexual activity: Not on file  Other Topics Concern   Not on file  Social History Narrative   Patient is retired and lives at home with his wife Bethena Roys.    Education college.   Caffeine one cup daily.   Social Determinants of Health   Financial Resource Strain: Not on file  Food Insecurity: Not on file  Transportation Needs: Not on file  Physical Activity: Not on file  Stress: Not on file  Social Connections: Not on file  Intimate Partner Violence: Not on file   PHYSICAL EXAM  There were no vitals filed for this visit.  There is no height or weight on file to calculate BMI.   PHYSICAL EXAMNIATION:  Gen: NAD, conversant, well nourised, well groomed                     Cardiovascular: Regular rate rhythm, no peripheral edema, warm, nontender. Eyes: Conjunctivae clear without exudates or hemorrhage Neck: Supple, no carotid bruits. Pulmonary: Clear to auscultation bilaterally   NEUROLOGICAL EXAM:  MENTAL STATUS: Speech/Cognition: Awake, alert, normal speech, oriented to history taking and casual conversation.  CRANIAL NERVES: CN II: Visual fields are full to confrontation.  Pupils are round equal and briskly reactive to light. CN  III, IV, VI: extraocular movement are normal. No ptosis. CN V: Facial sensation is intact to light touch. CN VII: Face is symmetric with normal eye closure and smile. CN VIII: Hearing is normal to casual conversation CN IX, X: Palate elevates symmetrically. Phonation is normal. CN XI: Head turning and shoulder shrug are intact  MOTOR: Muscle bulk and tone are normal. Muscle strength is normal.  REFLEXES: Reflexes are 1  and symmetric at the biceps, triceps, knees and ankles. Plantar responses are flexor.  SENSORY: Length dependent decreased light touch, pinprick to mid shin level  COORDINATION: There is no trunk or limb ataxia.    GAIT/STANCE: Need push-up to get up from seated position, antalgic  DIAGNOSTIC DATA (LABS, IMAGING, TESTING) - I reviewed patient records, labs, notes, testing and imaging myself where available.  Lab Results  Component Value Date   WBC 5.4 12/18/2020  HGB 8.9 (L) 12/18/2020   HCT 27.8 (L) 12/18/2020   MCV 105.7 (H) 12/18/2020   PLT 256 12/18/2020      Component Value Date/Time   NA 136 12/18/2020 0442   NA 138 04/29/2016 0000   K 3.9 12/18/2020 0442   CL 103 12/18/2020 0442   CO2 27 12/18/2020 0442   GLUCOSE 104 (H) 12/18/2020 0442   BUN 23 12/18/2020 0442   BUN 19 04/29/2016 0000   CREATININE 1.27 (H) 12/18/2020 0442   CALCIUM 8.2 (L) 12/18/2020 0442   PROT 6.0 (L) 12/17/2020 0544   PROT 6.3 01/13/2017 0846   ALBUMIN 3.7 12/17/2020 0544   ALBUMIN 4.4 01/13/2017 0846   AST 24 12/17/2020 0544   ALT 18 12/17/2020 0544   ALKPHOS 58 12/17/2020 0544   BILITOT 0.8 12/17/2020 0544   BILITOT 0.4 01/13/2017 0846   GFRNONAA 56 (L) 12/18/2020 0442   GFRAA 59 (L) 04/14/2017 1000   Lab Results  Component Value Date   CHOL 90 (L) 01/13/2017   HDL 29 (L) 01/13/2017   LDLCALC 33 01/13/2017   TRIG 138 01/13/2017   CHOLHDL 3.8 01/18/2016   No results found for: HGBA1C Lab Results  Component Value Date   TWSFKCLE75 170 12/17/2020   Lab  Results  Component Value Date   TSH 2.723 12/17/2020

## 2021-04-25 NOTE — Procedures (Signed)
Full Name: [name] Gender: Male MRN #: [id number] Date of Birth: 1940-06-07    Visit Date: 04/25/2021 07:32 Age: 80 Years Examining Physician: Marcial Pacas, MD  Referring Physician: Marcial Pacas, MD History: 80 year old male presented with bilateral feet paresthesia  Summary of the test: Nerve conduction study: Bilateral sural, superficial peroneal sensory responses were absent.  Right ulnar sensory and motor responses were within normal limit.  Bilateral peroneal and tibial motor responses showed mild to moderately decreased CMAP amplitude, with moderately slowed conduction velocity  Electromyography:  Selected needle examination of bilateral lower extremity muscles showed mildly decreased recruitment at distal leg muscles, mainly involving bilateral tibialis anterior, peroneal longus, tibialis posterior, and medial gastrocnemius  There was no spontaneous activity at bilateral lumbosacral paraspinals  Conclusion: This is an abnormal study.  There is electrodiagnostic evidence of moderate axonal sensorimotor polyneuropathy, likely with are superimposed chronic bilateral lumbosacral radiculopathy.    ------------------------------- [name] [address] [place], [place] [address] Tel: [phone] Fax: [phone]  Verbal informed consent was obtained from the patient, patient was informed of potential risk of procedure, including bruising, bleeding, hematoma formation, infection, muscle weakness, muscle pain, numbness, among others.        [place]    Nerve / Sites Muscle Latency Ref. Amplitude Ref. Rel Amp Segments Distance Velocity Ref. Area    ms ms mV mV %  cm m/s m/s mVms  R Ulnar - ADM     Wrist ADM 3.2 ?3.3 10.7 ?6.0 100 Wrist - ADM 7   24.5     B.Elbow ADM 7.1  8.7  81.8 B.Elbow - Wrist 20 52 ?49 24.0     A.Elbow ADM 9.0  8.6  99.1 A.Elbow - B.Elbow 10 52 ?49 23.8  R Peroneal - EDB     Ankle EDB 6.2 ?6.5  0.9 ?2.0 100 Ankle - EDB 9   3.4     Fib head EDB 13.7  0.7  76.9 Fib head - Ankle 30 40 ?44 2.5     Pop fossa EDB 16.3  0.6  96.6 Pop fossa - Fib head 10 40 ?44 3.5         Pop fossa - Ankle      L Peroneal - EDB     Ankle EDB 3.5 ?6.5 1.1 ?2.0 100 Ankle - EDB 9   3.5     Fib head EDB 12.2  0.3  24.7 Fib head - Ankle 32 37 ?44 0.6     Pop fossa EDB 15.5  0.8  282 Pop fossa - Fib head 10 30 ?44 3.9         Pop fossa - Ankle      R Tibial - AH     Ankle AH 3.3 ?5.8 0.2 ?4.0 100 Ankle - AH 9   1.2     Pop fossa AH 16.1  0.1  24.5 Pop fossa - Ankle 42 33 ?41 0.5  L Tibial - AH     Ankle AH 3.9 ?5.8 0.7 ?4.0 100 Ankle - AH 9   2.6     Pop fossa AH 16.5  0.2  32.7 Pop fossa - Ankle 44 35 ?41 0.7               SNC    Nerve / Sites Rec. Site Peak Lat Ref.  Amp Ref. Segments Distance    ms ms V V  cm  R Sural -  Ankle (Calf)     Calf Ankle NR ?4.4 NR ?6 Calf - Ankle 14  L Sural - Ankle (Calf)     Calf Ankle NR ?4.4 NR ?6 Calf - Ankle 14  R Superficial peroneal - Ankle     Lat leg Ankle NR ?4.4 NR ?6 Lat leg - Ankle 14  L Superficial peroneal - Ankle     Lat leg Ankle NR ?4.4 NR ?6 Lat leg - Ankle 14  R Ulnar - Orthodromic, (Dig V, Mid palm)     Dig V Wrist 2.9 ?3.1 5 ?5 Dig V - Wrist 52               F  Wave    Nerve F Lat Ref.   ms ms  R Tibial - AH 58.1 ?56.0  L Tibial - AH 58.3 ?56.0  R Ulnar - ADM 31.6 ?32.0           EMG Summary Table    Spontaneous MUAP Recruitment  Muscle IA Fib PSW Fasc Other Amp Dur. Poly Pattern  R. Tibialis anterior Normal None None None _______ Normal Normal Normal Reduced  R. Tibialis posterior Normal None None None _______ Normal Normal Normal Reduced  R. Peroneus longus Normal None None None _______ Normal Normal Normal Reduced  R. Gastrocnemius (Medial head) Normal None None None _______ Normal Normal Normal Reduced  R. Vastus lateralis Normal None None None _______ Normal Normal Normal Normal  L. Tibialis anterior Normal None None None _______  Normal Normal Normal Reduced  L. Tibialis posterior Normal None None None _______ Normal Normal Normal Reduced  L. Peroneus longus Normal None None None _______ Normal Normal Normal Reduced  L. Gastrocnemius (Medial head) Normal None None None _______ Normal Normal Normal Reduced  L. Vastus lateralis Normal None None None _______ Normal Normal Normal Normal  R. Lumbar paraspinals (low) Normal None None None _______ Normal Normal Normal Normal  R. Lumbar paraspinals (mid) Normal None None None _______ Normal Normal Normal Normal  L. Lumbar paraspinals (low) Normal None None None _______ Normal Normal Normal Normal  L. Lumbar paraspinals (mid) Normal None None None _______ Normal Normal Normal Normal

## 2021-05-02 LAB — MULTIPLE MYELOMA PANEL, SERUM
Albumin SerPl Elph-Mcnc: 3.9 g/dL (ref 2.9–4.4)
Albumin/Glob SerPl: 1.6 (ref 0.7–1.7)
Alpha 1: 0.2 g/dL (ref 0.0–0.4)
Alpha2 Glob SerPl Elph-Mcnc: 0.7 g/dL (ref 0.4–1.0)
B-Globulin SerPl Elph-Mcnc: 0.9 g/dL (ref 0.7–1.3)
Gamma Glob SerPl Elph-Mcnc: 0.8 g/dL (ref 0.4–1.8)
Globulin, Total: 2.5 g/dL (ref 2.2–3.9)
IgA/Immunoglobulin A, Serum: 227 mg/dL (ref 61–437)
IgG (Immunoglobin G), Serum: 805 mg/dL (ref 603–1613)
IgM (Immunoglobulin M), Srm: 48 mg/dL (ref 15–143)
Total Protein: 6.4 g/dL (ref 6.0–8.5)

## 2021-05-02 LAB — IRON AND TIBC
Iron Saturation: 69 % — ABNORMAL HIGH (ref 15–55)
Iron: 214 ug/dL — ABNORMAL HIGH (ref 38–169)
Total Iron Binding Capacity: 311 ug/dL (ref 250–450)
UIBC: 97 ug/dL — ABNORMAL LOW (ref 111–343)

## 2021-05-02 LAB — ANA W/REFLEX IF POSITIVE: Anti Nuclear Antibody (ANA): NEGATIVE

## 2021-05-02 LAB — FERRITIN: Ferritin: 54 ng/mL (ref 30–400)

## 2021-05-02 LAB — HGB A1C W/O EAG: Hgb A1c MFr Bld: 5.6 % (ref 4.8–5.6)

## 2021-05-13 NOTE — Progress Notes (Signed)
[name] Date of Birth: Apr 30, 1941 Medical Record #[id number]  History of Present Illness: [name] is seen for evaluation of new onset Afib. He has a history of mixed hyperlipidemia, HTN, and obesity.  He has been intolerant to statins due to severe myalgias. This includes lipitor at a dose of 20 mg daily and Crestor 10 mg 3 days a week. Welchol apparently made no change in his lipid levels. Niacin was associated with severe flushing.   He has no history of vascular disease. He had normal cardiac caths in 1998 and 2009. No history of PAD or CVA/TIA.  He apparently had afib in 1962 but no documented recurrence afterwards.   He was seen recently by Dr Addison Lank for routine physical and found to be in Afib with controlled rate. Not symptomatic. Started on anticoagulation with Eliquis. Echo showed Normal LV and valvular function. Moderate LAE.  He underwent right TKR in June.   In July he was admitted with GI bleed. Hgb down to 8.3. transfused one unit of blood. EGD showed a large 10 cm hiatal hernia and multiple duodenal ulcers. NSAIDs discontinued and started on PPI.   On follow up lab work in Oct Hgb was back to normal.  He feels well. Can't tell he is in Afib. No chest pain or SOB. Has peripheral neuropathy. Managed by Dr [name]. Getting ready to move to [facility].   Current Outpatient Medications on File Prior to Visit  Medication Sig Dispense Refill   acetaminophen (TYLENOL) 325 MG tablet Take 2 tablets (650 mg total) by mouth every 6 (six) hours as needed for mild pain (or Fever >/= 101).     carvedilol (COREG) 3.125 MG tablet Take 3.125 mg by mouth at bedtime.      clonazePAM (KLONOPIN) 0.5 MG tablet take 1/2-1 TABLET BY MOUTH AT BEDTIME AS NEEDED 30 tablet 5   DULoxetine (CYMBALTA) 60 MG capsule Take 1 capsule (60 mg total) by mouth daily. 90 capsule 4   ELIQUIS 5 MG TABS tablet Take 1 tablet (5 mg total) by mouth 2 (two) times daily. 180 tablet 1   ferrous sulfate 324 (65 Fe) MG  TBEC Take 324 mg by mouth daily.     Multiple Vitamins-Minerals (MULTIVITAMIN PO) Take 1 tablet by mouth daily.     nitroGLYCERIN (NITROSTAT) 0.4 MG SL tablet Place 0.4 mg under the tongue every 5 (five) minutes as needed for chest pain.     pantoprazole (PROTONIX) 40 MG tablet Take 1 tablet (40 mg total) by mouth 2 (two) times daily before a meal. (Patient taking differently: Take 40 mg by mouth 2 (two) times daily before a meal. TAPERING DOWN.TAKING 1 TAB BID X 7 DAYS, THEN 1 TAB DAILY FOR 5 DAYS A WEEK.) 60 tablet 1   polyethylene glycol (MIRALAX / GLYCOLAX) packet Take 17 g by mouth daily as needed for mild constipation.     rOPINIRole (REQUIP) 0.5 MG tablet Take 1 tablet (0.5 mg total) by mouth at bedtime. 30 tablet 11   rosuvastatin (CRESTOR) 10 MG tablet TAKE 1 TABLET ONCE DAILY OR AS DIRECTED. 90 tablet 2   tamsulosin (FLOMAX) 0.4 MG CAPS capsule Take 0.4 mg by mouth daily.     traMADol (ULTRAM) 50 MG tablet Take 1 tablet (50 mg total) by mouth every 6 (six) hours as needed for moderate pain. 20 tablet 0   No current facility-administered medications on file prior to visit.    Allergies  Allergen Reactions   Compazine [Prochlorperazine Edisylate]  Other (See Comments)    EXTRAPYRAMIDAL MOVEMENT [Involuntary muscle movement]   Penicillins Nausea And Vomiting, Rash and Other (See Comments)    Has patient had a PCN reaction causing immediate rash, facial/tongue/throat swelling, SOB or lightheadedness with hypotension: #  #  #  YES  #  #  #  Has patient had a PCN reaction causing severe rash involving mucus membranes or skin necrosis: No Has patient had a PCN reaction that required hospitalization No Has patient had a PCN reaction occurring within the last 10 years: No If all of the above answers are "NO", then may proceed with Cephalosporin use.    Welchol [Colesevelam Hcl] Other (See Comments)    MYALGIAS [Leg aches]   Gemfibrozil Nausea Only    PATIENT TOLERATES   Red Yeast Rice  [Cholestin] Other (See Comments)    flushing    Past Medical History:  Diagnosis Date   Anginal pain ([place])    NONE IN 3 YEARS   Arthritis    Atrial fibrillation ([place]) 1962   a. No recurrence since 1962.   Chest pain    a. 1998 Cath: nl cors;  b. 2009 Cath: nl cors.   Depression    Dysrhythmia    NO TROUBLE IN 1 YR    DR. Mikaylah Libbey [place]    Esophageal reflux    Essential hypertension    History of Paroxysmal atrial flutter ([facility])    Hyperlipidemia    Incontinence of urine    Leg pain, bilateral    Nocturia    PONV (postoperative nausea and vomiting)    CONSTIPATED   Scarlet fever 1945   Spinal stenosis    Staph skin infection    LEFT GROIN  04/11/16  TX W/ DOXYCYCLINE   Ulcerative proctitis ([place])     Past Surgical History:  Procedure Laterality Date   ANKLE RECONSTRUCTION Right 2007   [place]   ANTERIOR LAT LUMBAR FUSION Right 04/18/2016   Procedure: RIGHT LUMBAR THREE-FOUR, LUMBAR FOUR-FIVE ANTEROLATERAL LUMBAR INTERBODY FUSION;  Surgeon: Erline Levine, MD;  Location: [place];  Service: Neurosurgery;  Laterality: Right;  RIGHT L3-4 L4-5 ANTEROLATERAL LUMBAR INTERBODY FUSION   BIOPSY  12/16/2020   Procedure: BIOPSY;  Surgeon: Ronnette Juniper, MD;  Location: [facility];  Service: Gastroenterology;;   CARDIAC CATHETERIZATION  03/03/2008   Left heart cardiac catheterization and coronary   CARDIAC CATHETERIZATION  1997   Dr Wynonia Lawman   CATARACT EXTRACTION W/ INTRAOCULAR LENS  IMPLANT, BILATERAL     2016   ESOPHAGEAL DILATION     2008   ESOPHAGOGASTRODUODENOSCOPY N/A 12/16/2020   Procedure: ESOPHAGOGASTRODUODENOSCOPY (EGD);  Surgeon: Ronnette Juniper, MD;  Location: [facility];  Service: Gastroenterology;  Laterality: N/A;   INNER EAR SURGERY     EAR INJECTION FOR MENIERES   KNEE ARTHROSCOPY  1991, 1995   LUMBAR LAMINECTOMY  12/2010   LUMBAR LAMINECTOMY/DECOMPRESSION MICRODISCECTOMY Left 08/15/2016   Procedure: Left Left three- four Redo laminectomy;  Surgeon: Erline Levine, MD;   Location: [place];  Service: Neurosurgery;  Laterality: Left;  Left L3-4 Redo laminectomy   LUMBAR PERCUTANEOUS PEDICLE SCREW 2 LEVEL N/A 04/18/2016   Procedure: LUMBAR THREE-FOUR, LUMBAR FOUR-FIVE PERCUTANEOUS PEDICLE SCREW;  Surgeon: Erline Levine, MD;  Location: [place];  Service: Neurosurgery;  Laterality: N/A;   TOTAL HIP ARTHROPLASTY Left 04/22/2017   TOTAL HIP ARTHROPLASTY Left 04/22/2017   Procedure: TOTAL HIP ARTHROPLASTY ANTERIOR APPROACH;  Surgeon: Melrose Nakayama, MD;  Location: [place];  Service: Orthopedics;  Laterality: Left;  TOTAL KNEE ARTHROPLASTY Right 11/21/2020   Procedure: RIGHT TOTAL KNEE ARTHROPLASTY;  Surgeon: Melrose Nakayama, MD;  Location: [facility];  Service: Orthopedics;  Laterality: Right;    Social History   Tobacco Use  Smoking Status Some Days   Types: Pipe  Smokeless Tobacco Never    Social History   Substance and Sexual Activity  Alcohol Use Yes   Alcohol/week: 1.0 standard drink   Types: 1 Shots of liquor per week   Comment: occasionally    Family History  Problem Relation Age of Onset   Prostate cancer Father    Heart attack Brother    Kidney cancer Brother    Brain cancer Brother     Review of Systems: As noted in HPI.  All other systems were reviewed and are negative.  Physical Exam: BP 112/72 (BP Location: Left Arm, Patient Position: Sitting, Cuff Size: Normal)   Pulse 82   Resp 20   Ht 5\' 10"  (1.778 m)   Wt 240 lb 12.8 oz (109.2 kg)   SpO2 94%   BMI 34.55 kg/m  GENERAL:  Well appearing WM in NAD. Walks with a cane HEENT:  PERRL, EOMI, sclera are clear. Oropharynx is clear. NECK:  No jugular venous distention, carotid upstroke brisk and symmetric, no bruits, no thyromegaly or adenopathy LUNGS:  Clear to auscultation bilaterally CHEST:  Unremarkable HEART:  IRRR,  PMI not displaced or sustained,S1 and S2 within normal limits, no S3, no S4: no clicks, no rubs, no murmurs ABD:  Soft, nontender. BS +, no masses or bruits. No hepatomegaly,  no splenomegaly EXT:  2 + pulses throughout, no edema, no cyanosis no clubbing SKIN:  Warm and dry.  No rashes NEURO:  Alert and oriented x 3. Cranial nerves II through XII intact. PSYCH:  Cognitively intact  LABORATORY DATA: Lab Results  Component Value Date   WBC 5.4 12/18/2020   HGB 8.9 (L) 12/18/2020   HCT 27.8 (L) 12/18/2020   PLT 256 12/18/2020   GLUCOSE 104 (H) 12/18/2020   CHOL 90 (L) 01/13/2017   TRIG 138 01/13/2017   HDL 29 (L) 01/13/2017   LDLCALC 33 01/13/2017   ALT 18 12/17/2020   AST 24 12/17/2020   NA 136 12/18/2020   K 3.9 12/18/2020   CL 103 12/18/2020   CREATININE 1.27 (H) 12/18/2020   BUN 23 12/18/2020   CO2 27 12/18/2020   TSH 2.723 12/17/2020   INR 1.1 11/14/2020   HGBA1C 5.6 04/25/2021   Labs dated 12/26/16: A1c 5.8%, Hgb 14.2, creatinine 1.21. TSH normal.  Dated 07/07/18: A1c 5.6%. creatinine 1.34. potassium 5.5. TSH normal. CBC normal. Dated 01/04/19: A1c 5.7% Dated 06/03/19: creatinine 1.32. CMET and CBC normal.  Dated 09/06/20: cholesterol 169, triglycerides 169, HDL 29, LDL 110, A1c 5.5%. creatinine 1.4. otherwise CMET normal. CBC normal. Dated 03/08/21: cholesterol 94, triglycerides 97, HDL 32, LDL 44. A1c 5.6%. creatinine 1.4. otherwise CBC, CMET and TSH normal.   Ecg not done today  Echo 07/09/18: IMPRESSIONS      1. The left ventricle has normal systolic function of 16-10%. The cavity size was normal. There is no increased left ventricular wall thickness. Left ventricular diastology could not be evaluated secondary to atrial fibrillation.  2. The right ventricle has normal systolic function. The cavity was mildly enlarged. There is no increase in right ventricular wall thickness.  3. Left atrial size was moderately dilated.  4. The mitral valve is normal in structure.  5. The tricuspid valve is normal in structure.  6. The aortic valve is normal in structure. Aortic valve regurgitation is mild by color flow Doppler.  7. The pulmonic valve was  normal in structure.  8. There is mild dilatation of the ascending aorta.   FINDINGS  Left Ventricle: The left ventricle has normal systolic function of 50-15%. The cavity size was normal. There is no increased left ventricular wall thickness. Left ventricular diastology could not be evaluated secondary to atrial fibrillation. Right Ventricle: The right ventricle has normal systolic function. The cavity was mildly enlarged. There is no increase in right ventricular wall thickness. Left Atrium: left atrial size was moderately dilated Right Atrium: right atrial size was normal in size Interatrial Septum: No atrial level shunt detected by color flow Doppler.   Assessment / Plan: 1. Atrial fibrillation- persistent and now permanent.  He is completely asymptomatic. Rate controlled on low dose Coreg. Now on Eliquis for anticoagulation. [place] Vasc of 2.   2. Mixed hyperlipidemia. Now on Crestor only and tolerating well. Labs look good.   3. HTN- controlled on low dose Coreg  4. Normal cardiac cath 2009.  5. S/p right TKR  6. History of GI bleed secondary to duodenal ulcers.   I will follow up in one year

## 2021-05-15 ENCOUNTER — Encounter: Payer: Self-pay | Admitting: Cardiology

## 2021-05-15 ENCOUNTER — Ambulatory Visit: Payer: Medicare Other | Admitting: Cardiology

## 2021-05-15 ENCOUNTER — Other Ambulatory Visit: Payer: Self-pay

## 2021-05-15 VITALS — BP 112/72 | HR 82 | Resp 20 | Ht 70.0 in | Wt 240.8 lb

## 2021-05-15 DIAGNOSIS — I4821 Permanent atrial fibrillation: Secondary | ICD-10-CM | POA: Diagnosis not present

## 2021-05-15 DIAGNOSIS — E782 Mixed hyperlipidemia: Secondary | ICD-10-CM | POA: Diagnosis not present

## 2021-05-23 DIAGNOSIS — M1711 Unilateral primary osteoarthritis, right knee: Secondary | ICD-10-CM | POA: Diagnosis not present

## 2021-06-01 ENCOUNTER — Other Ambulatory Visit: Payer: Self-pay | Admitting: Neurology

## 2021-06-05 NOTE — Telephone Encounter (Signed)
Rx refilled.

## 2021-06-08 ENCOUNTER — Other Ambulatory Visit: Payer: Self-pay | Admitting: Neurology

## 2021-06-09 ENCOUNTER — Encounter: Payer: Self-pay | Admitting: Neurology

## 2021-06-09 DIAGNOSIS — G2581 Restless legs syndrome: Secondary | ICD-10-CM

## 2021-06-11 ENCOUNTER — Other Ambulatory Visit: Payer: Self-pay | Admitting: *Deleted

## 2021-06-11 MED ORDER — PREGABALIN 100 MG PO CAPS: 400.0000 mg | ORAL_CAPSULE | Freq: Every day | ORAL | 5 refills | Status: DC

## 2021-06-11 NOTE — Telephone Encounter (Signed)
Last visit was on April 25, 2021, was on Lyrica 100 mg 3 tablets every night, Cymbalta 60 mg daily, Requip 0.5 mg at bedtime, very rarely has to use clonazepam  Ferritin level was 54  1.  May suggest him taking over-the-counter iron supplement, 2.  It is okay to increase Lyrica up to 100 mg 4 tablets every night, continue Cymbalta 60 mg daily, Requip 0.5 mg at bedtime, occasionally clonazepam Meds ordered this encounter  Medications   pregabalin (LYRICA) 100 MG capsule    Sig: Take 4 capsules (400 mg total) by mouth at bedtime.    Dispense:  120 capsule    Refill:  5     3.  Give him a follow-up visit with nurse practitioner in 3 to 4 months

## 2021-06-11 NOTE — Telephone Encounter (Signed)
I spoke to the patient. He is agreeable to this plan. He will call us back if symptoms do not improve.

## 2021-06-11 NOTE — Telephone Encounter (Signed)
Please Review:  Clonazepam last filled 04/13/2020 for #30.  (Just fyi, per registry, since 7/22 pt has had prescriptions for Hydrocodone, Oxycodone, Tramadol, Lyrica)  Next visit: not scheduled Last visit: 04/25/21 ([facility])

## 2021-06-11 NOTE — Telephone Encounter (Signed)
Meds ordered this encounter  Medications   clonazePAM (KLONOPIN) 0.5 MG tablet    Sig: take 1/2-1 TABLET BY MOUTH EVERYDAY AT BEDTIME AS NEEDED    Dispense:  30 tablet    Refill:  2

## 2021-06-21 DIAGNOSIS — N1831 Chronic kidney disease, stage 3a: Secondary | ICD-10-CM | POA: Diagnosis not present

## 2021-06-21 DIAGNOSIS — E782 Mixed hyperlipidemia: Secondary | ICD-10-CM | POA: Diagnosis not present

## 2021-06-21 DIAGNOSIS — M179 Osteoarthritis of knee, unspecified: Secondary | ICD-10-CM | POA: Diagnosis not present

## 2021-06-21 DIAGNOSIS — K219 Gastro-esophageal reflux disease without esophagitis: Secondary | ICD-10-CM | POA: Diagnosis not present

## 2021-06-21 DIAGNOSIS — I10 Essential (primary) hypertension: Secondary | ICD-10-CM | POA: Diagnosis not present

## 2021-06-21 DIAGNOSIS — D509 Iron deficiency anemia, unspecified: Secondary | ICD-10-CM | POA: Diagnosis not present

## 2021-08-15 DIAGNOSIS — D225 Melanocytic nevi of trunk: Secondary | ICD-10-CM | POA: Diagnosis not present

## 2021-08-15 DIAGNOSIS — L814 Other melanin hyperpigmentation: Secondary | ICD-10-CM | POA: Diagnosis not present

## 2021-08-15 DIAGNOSIS — L281 Prurigo nodularis: Secondary | ICD-10-CM | POA: Diagnosis not present

## 2021-08-15 DIAGNOSIS — R202 Paresthesia of skin: Secondary | ICD-10-CM | POA: Diagnosis not present

## 2021-08-15 DIAGNOSIS — B0229 Other postherpetic nervous system involvement: Secondary | ICD-10-CM | POA: Diagnosis not present

## 2021-08-15 DIAGNOSIS — L821 Other seborrheic keratosis: Secondary | ICD-10-CM | POA: Diagnosis not present

## 2021-08-15 DIAGNOSIS — L570 Actinic keratosis: Secondary | ICD-10-CM | POA: Diagnosis not present

## 2021-08-29 DIAGNOSIS — N1831 Chronic kidney disease, stage 3a: Secondary | ICD-10-CM | POA: Diagnosis not present

## 2021-08-29 DIAGNOSIS — I10 Essential (primary) hypertension: Secondary | ICD-10-CM | POA: Diagnosis not present

## 2021-08-29 DIAGNOSIS — E782 Mixed hyperlipidemia: Secondary | ICD-10-CM | POA: Diagnosis not present

## 2021-09-13 DIAGNOSIS — G2581 Restless legs syndrome: Secondary | ICD-10-CM | POA: Diagnosis not present

## 2021-09-13 DIAGNOSIS — I4819 Other persistent atrial fibrillation: Secondary | ICD-10-CM | POA: Diagnosis not present

## 2021-09-13 DIAGNOSIS — I700 Atherosclerosis of aorta: Secondary | ICD-10-CM | POA: Diagnosis not present

## 2021-09-13 DIAGNOSIS — R7301 Impaired fasting glucose: Secondary | ICD-10-CM | POA: Diagnosis not present

## 2021-09-13 DIAGNOSIS — K5900 Constipation, unspecified: Secondary | ICD-10-CM | POA: Diagnosis not present

## 2021-09-13 DIAGNOSIS — D6869 Other thrombophilia: Secondary | ICD-10-CM | POA: Diagnosis not present

## 2021-09-13 DIAGNOSIS — E782 Mixed hyperlipidemia: Secondary | ICD-10-CM | POA: Diagnosis not present

## 2021-09-13 DIAGNOSIS — M5451 Vertebrogenic low back pain: Secondary | ICD-10-CM | POA: Diagnosis not present

## 2021-09-13 DIAGNOSIS — I10 Essential (primary) hypertension: Secondary | ICD-10-CM | POA: Diagnosis not present

## 2021-09-13 DIAGNOSIS — Z79899 Other long term (current) drug therapy: Secondary | ICD-10-CM | POA: Diagnosis not present

## 2021-09-13 DIAGNOSIS — D509 Iron deficiency anemia, unspecified: Secondary | ICD-10-CM | POA: Diagnosis not present

## 2021-09-13 DIAGNOSIS — K269 Duodenal ulcer, unspecified as acute or chronic, without hemorrhage or perforation: Secondary | ICD-10-CM | POA: Diagnosis not present

## 2021-09-24 DIAGNOSIS — E875 Hyperkalemia: Secondary | ICD-10-CM | POA: Diagnosis not present

## 2021-11-02 DIAGNOSIS — Z961 Presence of intraocular lens: Secondary | ICD-10-CM | POA: Diagnosis not present

## 2021-11-02 DIAGNOSIS — H40013 Open angle with borderline findings, low risk, bilateral: Secondary | ICD-10-CM | POA: Diagnosis not present

## 2021-11-02 DIAGNOSIS — H5203 Hypermetropia, bilateral: Secondary | ICD-10-CM | POA: Diagnosis not present

## 2021-11-02 DIAGNOSIS — H26492 Other secondary cataract, left eye: Secondary | ICD-10-CM | POA: Diagnosis not present

## 2021-11-21 DIAGNOSIS — S50862A Insect bite (nonvenomous) of left forearm, initial encounter: Secondary | ICD-10-CM | POA: Diagnosis not present

## 2021-11-21 DIAGNOSIS — W57XXXA Bitten or stung by nonvenomous insect and other nonvenomous arthropods, initial encounter: Secondary | ICD-10-CM | POA: Diagnosis not present

## 2021-11-26 DIAGNOSIS — I10 Essential (primary) hypertension: Secondary | ICD-10-CM | POA: Diagnosis not present

## 2021-11-26 DIAGNOSIS — E782 Mixed hyperlipidemia: Secondary | ICD-10-CM | POA: Diagnosis not present

## 2021-11-26 DIAGNOSIS — I4819 Other persistent atrial fibrillation: Secondary | ICD-10-CM | POA: Diagnosis not present

## 2021-11-29 DIAGNOSIS — R300 Dysuria: Secondary | ICD-10-CM | POA: Diagnosis not present

## 2021-11-29 DIAGNOSIS — N390 Urinary tract infection, site not specified: Secondary | ICD-10-CM | POA: Diagnosis not present

## 2021-12-17 NOTE — Progress Notes (Unsigned)
Patient: [name] Date of Birth: February 06, 1941  Reason for Visit: Follow up History from: Patient Primary Neurologist: Dr. [name]   ASSESSMENT AND PLAN 81 y.o. year old male   1.  Peripheral neuropathy 2.  Restless leg syndrome 3.  Anemia, on iron supplement -Worsening symptom during the day, sleeping well at night, try to spread out the dose of Lyrica during the day up to 400 mg daily, try 100 mg 3 times daily; keep Requip 0.5 mg at bedtime, Cymbalta 60 mg daily, uses Klonopin very rarely -Referral to physical therapy for gait and balance at [facility] independent living facility -NCV/EMG in Nov 2023 showed moderate axonal sensorimotor polyneuropathy -Laboratory evaluation showed no treatable etiology -Follow-up in 6 months or sooner if needed  HISTORY  [name] is a 81 yo RH male, accompanied by his wife, referred by Dr. Jacelyn Grip, and his primary care physician Dr. Leonides Schanz for evaluation of chronic low back pain, bilateral lower extremity muscle ache  He had a past medical history of hypertension, hyperlipidemia, presenting with chronic low back pain, bilateral lower extremity muscle achy pain since 2010.   Initially it was thought due to his statin treatment, he has tried different statin without improving his symptoms,over the years, he also received multiple lumbar bilateral facet joint fluoroscopy guided injection without improving his symptoms.   In 2012, he was diagnosed with L4 and 5 lumbar stenosis, had lumbar laminectomy L3-4, L4-L5 by Dr. [name] in July 2012,without improving his symptoms,  He was seen by different specialists, tried different medications, this including Elavil, Xanax as needed, he is currently taking hydrocodone 5/325 mg 1-2 tablets every night, Xanax 0.25 milligram as needed, Flexeril 10 mg every night, gabapentin 600 mg every night without significant improvement,  During the daytime, he denies significant low back pain, or bilateral lower extremity pain,  he does has history of right ankle fracture, mild gait difficulty due to right ankle pain, but starting at evening time, he began to noticed deep achy pain starting from midline low back, radiating along bilateral lateral leg, constant, he tends to pace around, has difficulty sleeping at nighttime, sometimes he has to got up take a hot bath couple times every night, put ice on his back, on his feet, only provide temporary relief.  He has also tried acupuncture, massage, TENS unit, without significant improvement, previously, he has a short trial of low-dose Requip, without improving his symptoms, no significant side effect noticed either.  He has bilateral lateral 3 toes numbness, no weakness, no bilateral fingertips numbness, or weakness  Per record, electrodiagnostic study failed to demonstrate significant etiology,  We have reviewed MRI lumbar in 2012, there was evidence of L4-5 spinal stenosis, and the most recent MRI  Lumbar was  in June 2014,progression of multilevel facet arthropathy, status post a laminectomy at L3-4, with residual large synovial cyst extending along the posterior epidural space without significant central canal stenosis, mild foraminal narrowing bilaterally at L3 and 4, progression of mild lateral recess narrowing bilaterally at L4-5, foraminal narrowing is stable at this level  MRI of the brain in June 2014 showed mild atrophy, no acute intracranial abnormality   UPDATE Dec 15th 2015:   He is tolerating gabapentin 300 mg 2 tablets every night, Requip 1 mg 2 tablets every night, he can sleep better with the medications, but continue complains of bilateral lower extremity dull achy pain, numbness, urgency to move, especially at nighttime, He also complains of slow progressive gait  difficulty, grasps things for support, he has urinary urgency, but no incontinence.   UPDATE Jan 29th 2016: He is now taking gabapentin '300mg'$   2 tabs qhs and requip '1mg'$ , 3 tabs qhs, which has been very  helpful,  30%, improvement, he still has bilatarel leg pain at evening, stretching,massage,  He denies bowel and bladder incontinence , he has tinlging at the soles of his feet.    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   He continue complaining significant difficulty at nighttime, came in with a full page list of symptoms, in the morning time, he denies significant back pain, no low back pain, symptoms started in the late afternoon, gradually building up, around 9: to10 PM, he noticed achy feeling down lateral side of both leg, increased by sitting still, intense urge to lie on the floor, stretching muscle backwards, late night hot soaking bath to help him sleep, he has to take about 3 AM last night, difficulty sleeping, excessive fatigue during the daytime, could not sit through movies, standing up in the aisle 2/3 of the movie He is now taking, hydrocodone  5/'325mg'$  2 tabs 9pm, advil '200mg'$  2 tab    He has quit taking Requip 1 mg, up to 4 tablets every night, also gabapentin, complains of upset stomach, without relieving his symptoms, he is now taking Advil 200 mg 2 tablets as needed, hydrocodone/Tylenol 5/325 mg 2 tablets every night for symptoms control, which only mild, temporarily, Tylenol helps some too.   UPDATE April 29th 2016: Laboratory showed low iron, ferritin level at 9, he was put on iron supplement, his restless leg symptoms has much improved, He is now taking clonazepam 0.5 milligram every night, Lyrica 50 mg every night, he can sleep much better,   He continue, combat right ankle pain   UPDATE August 2nd 2016: He sleeps well, he is now taking clonazepam 0.'5mg'$  qhs, Lyrica  '50mg'$  3 tabs qhs, no longer on  Hydrocodone, he complains of side effect   UPDATE August 06 2016: I reviewed operation record on April 18 2016, he had right lumbar L3-4, L4-5 anterolateral lumbar interbody fusion, percutaneous pedicle screw on April 18 2016, which has helped his left-sided low back pain radiating pain to left lower extremity, 4. Of time he was almost pain-free, unfortunately he fell landed on his right side, is then he began to experience excruciating radiating pain from left side to left anterior thigh.   We have personally reviewed and compared to MRI lumbar in October 2017, and in February 2018. Which showed pedicle screw and interbody fusion at L3-4, L4-5 without stenosis, small fluid collection posterior to the thecal sac at L3-4, moderate right foraminal narrowing, mild foraminal narrowing at L4-5.   He is planning on to have second left-sided lumbar decompression surgery on August 15 2016   His restless leg symptoms is under good control with current dose of Lyrica 100 mg twice a day, clonazepam as needed, oxycodone 5 mg every night   His most recent ferritin level was 47, hemoglobin was 11 point 5,   UPDATE August 07 2017: He is accompanied by his wife at today's clinical visit, complaining worsening restless leg symptoms, for a while, his  restless leg symptoms seems to be under okay control.   Since 2017, he had multiple surgery, lumbar fusion by Dr. Birdena Crandall November 2017, redo in March 2018, left hip replacement in November 2018,   He has baseline gait abnormality, but no longer have significant pain, at nighttime, 1-2 hours after lying down, he has uncontrollable right leg muscle jerking, urged to move,   He is now taking Lyrica 100 mg 2 tablets at nighttime, clonazepam 0.5 mg every night, often have to take 1-1/2 tablets of Percocet 5/325 mg early morning time if he could not sleep his combination medication treatment.  Sometimes he has to take hot bath in the middle  of the night to help him go to sleep.   UPDATE Sept 9 2019: Now he is taking generic Lyric '100mg'$  3 tabs qhs, Requip 0.5 mg every night,  20% of time he is able to sleep without any difficulties, but 80% of the time about 1 to 2 hours after he goes to bed, he has severe leg discomfort, has to take a hot bath, then was able to sleep the rest of the night, sometimes he took clonazepam 0.5 mg half to 1 tablets before bedtime, complains of excessive drowsiness even to the point of wetting his bed,     His ferritin level is low 15 ng/ml March 2019, last colonoscopy was in 2017, hemoglobin in November 2018 prior to his left hip surgery was 13, he is now on iron supplement   UPDATE August 10 2018: Ferritin in Sept 2019 was 49.  He continues to have restless leg symptoms, halftime in a week, he has trouble sleeping due to restless leg symptoms, to get up to take a hot shower, which usually is very helpful, is taking Lyrica 100 in the morning, 200 mg at 2 hours prior to sleep, also take iron supplement,   UPDATE August 11 2019: He has lost few dear family member over short period of time, is feeling sad.  Around February 2021, he felt like his restless leg symptoms is under good control, decided to taper down Lyrica doses, instead of 100 mg 3 tablets every night, he tapered down to 100 mg every night, gradually increased difficulty sleeping, difficult to find a comfortable position for his leg, take hot bath, clonazepam 0.5 mg as needed was helpful last night, he also takes Cymbalta 60 mg daily, Requip 0.5 mg at bedtime   Laboratory evaluations in December 2020, normal CBC hemoglobin 14.3, CMP, creatinine of 1.2, ferritin was 15 August 2017, after iron supplement, improved to 49   CT abdomen in December 2020 showed no acute abnormality, large paraesophageal hiatal hernia, with approximately one half of the stomach present in the chest, diffuse colonic diverticulosis, without evidence of acute diverticulitis, mild  diffuse hepatic steatosis, marked prostate gland enlargement, with enhancing nodule arising from the superior portion of the median lobe of the prostate gland.   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 based on symptoms alone without looking at his skin lesion,   He denies a previous history of shingles, had a Shingrix vaccination end of 2020, did  reported history of left upper thoracic rash, itching few months ago   His restless leg symptoms is under good control with current regimen, Lyrica 100 mg 3 tablets every night, Requip 0.5 mg every night, Cymbalta 60 mg every morning, clonazepam 0.5 mg as needed   He has history of chronic low back pain, more to left side, had a history of lumbar decompression surgery in the past, left hip replacement in 2018, did have left hip injury within a week of replacement surgery, pain has gradually improved, also had a history of right ankle surgery, had gait abnormality.   UPDATE Sept 12 2022: He lost his wife in March 2022, going through grieving, had right knee replacement in June 2021, during the rehab, he was found to have dark stool, epigastric discomfort, using NSAIDs for right knee pain, also taking Eliquis for atrial fibrillation  Reviewed hospital record CT showed duodenitis, had active GI bleeding due to duodenal ulcer, hemoglobin of 8.9 required blood transfusion,   He is planning on moving to independent living, current medicine works well for his restless leg   He also complains of worsening bilateral feet numbness tingling   Update  April 25, 2021: His restless leg symptoms overall is under good control with his current medications, Lyrica 100 mg 3 tablets every night, clonazepam as needed, he rarely uses it, Cymbalta 60 mg every day,  He return for electrodiagnostic study today, which did show evidence of moderate axonal peripheral neuropathy, in addition there is evidence of chronic bilateral lumbosacral radiculopathy.  Reviewed laboratory evaluation in July 2022, when he was admitted to the hospital for GI bleeding, hemoglobin was 8.9, normal TSH, BMP, with creatinine of 1.27 calcium was 8.2, GFR of 56  Update December 18, 2021 SS: Labs in November 2022 A1c 5.6, MM panel negative, ANA negative, ferritin 54. Reports more tingling up to almost knee level.  Uses cane, has bad right knee, left hip. At times feels at risk for falling, variety of factors. Living at [facility]. Taking 400 mg Lyrica at night, Klonopin 0.5 mg PRN at bedtime only once a month, Cymbalta 60 mg daily, Requip 0.5 mg at bedtime. No more RLS symptoms. Takes iron tablet. Had virtual visit with PCP today for depression, no medications added.   REVIEW OF SYSTEMS: Out of a complete 14 system review of symptoms, the patient complains only of the following symptoms, and all other reviewed systems are negative.  See HPI  ALLERGIES: Allergies  Allergen Reactions   Compazine [Prochlorperazine Edisylate] Other (See Comments)    EXTRAPYRAMIDAL MOVEMENT [Involuntary muscle movement]   Penicillins Nausea And Vomiting, Rash and Other (See Comments)    Has patient had a PCN reaction causing immediate rash, facial/tongue/throat swelling, SOB or lightheadedness with hypotension: #  #  #  YES  #  #  #  Has patient had a PCN reaction causing severe rash involving mucus membranes or skin necrosis: No Has patient had a PCN reaction that required hospitalization No Has patient had a PCN reaction occurring within the last 10 years: No If all of  the above answers are "NO", then may proceed with Cephalosporin use.    Welchol [Colesevelam Hcl] Other (See Comments)    MYALGIAS [Leg aches]   Gemfibrozil Nausea Only    PATIENT TOLERATES   Red Yeast Rice [Cholestin] Other (See Comments)    flushing    HOME MEDICATIONS: Outpatient Medications Prior to Visit  Medication Sig Dispense Refill   acetaminophen (  TYLENOL) 325 MG tablet Take 2 tablets (650 mg total) by mouth every 6 (six) hours as needed for mild pain (or Fever >/= 101).     carvedilol (COREG) 3.125 MG tablet Take 3.125 mg by mouth at bedtime.      clonazePAM (KLONOPIN) 0.5 MG tablet take 1/2-1 TABLET BY MOUTH EVERYDAY AT BEDTIME AS NEEDED 30 tablet 2   DULoxetine (CYMBALTA) 60 MG capsule Take 1 capsule (60 mg total) by mouth daily. 90 capsule 4   ELIQUIS 5 MG TABS tablet Take 1 tablet (5 mg total) by mouth 2 (two) times daily. 180 tablet 1   ferrous sulfate 324 (65 Fe) MG TBEC Take 324 mg by mouth daily.     Multiple Vitamins-Minerals (MULTIVITAMIN PO) Take 1 tablet by mouth daily.     nitroGLYCERIN (NITROSTAT) 0.4 MG SL tablet Place 0.4 mg under the tongue every 5 (five) minutes as needed for chest pain.     pantoprazole (PROTONIX) 40 MG tablet Take 1 tablet (40 mg total) by mouth 2 (two) times daily before a meal. (Patient taking differently: Take 40 mg by mouth 2 (two) times daily before a meal. TAPERING DOWN.TAKING 1 TAB BID X 7 DAYS, THEN 1 TAB DAILY FOR 5 DAYS A WEEK.) 60 tablet 1   polyethylene glycol (MIRALAX / GLYCOLAX) packet Take 17 g by mouth daily as needed for mild constipation.     pregabalin (LYRICA) 100 MG capsule Take 4 capsules (400 mg total) by mouth at bedtime. 120 capsule 5   rOPINIRole (REQUIP) 0.5 MG tablet TAKE ONE TABLET BY MOUTH EVERY EVENING 30 tablet 11   rosuvastatin (CRESTOR) 10 MG tablet TAKE 1 TABLET ONCE DAILY OR AS DIRECTED. 90 tablet 2   tamsulosin (FLOMAX) 0.4 MG CAPS capsule Take 0.4 mg by mouth daily.     traMADol (ULTRAM) 50 MG tablet Take  1 tablet (50 mg total) by mouth every 6 (six) hours as needed for moderate pain. 20 tablet 0   No facility-administered medications prior to visit.    PAST MEDICAL HISTORY: Past Medical History:  Diagnosis Date   Anginal pain ([place])    NONE IN 3 YEARS   Arthritis    Atrial fibrillation ([place]) 1962   a. No recurrence since 1962.   Chest pain    a. 1998 Cath: nl cors;  b. 2009 Cath: nl cors.   Depression    Dysrhythmia    NO TROUBLE IN 1 YR    DR. PETER [place]    Esophageal reflux    Essential hypertension    History of Paroxysmal atrial flutter ([facility])    Hyperlipidemia    Incontinence of urine    Leg pain, bilateral    Nocturia    PONV (postoperative nausea and vomiting)    CONSTIPATED   Scarlet fever 1945   Spinal stenosis    Staph skin infection    LEFT GROIN  04/11/16  TX W/ DOXYCYCLINE   Ulcerative proctitis ([place])     PAST SURGICAL HISTORY: Past Surgical History:  Procedure Laterality Date   ANKLE RECONSTRUCTION Right 2007   [place]   ANTERIOR LAT LUMBAR FUSION Right 04/18/2016   Procedure: RIGHT LUMBAR THREE-FOUR, LUMBAR FOUR-FIVE ANTEROLATERAL LUMBAR INTERBODY FUSION;  Surgeon: Erline Levine, MD;  Location: [facility];  Service: Neurosurgery;  Laterality: Right;  RIGHT L3-4 L4-5 ANTEROLATERAL LUMBAR INTERBODY FUSION   BIOPSY  12/16/2020   Procedure: BIOPSY;  Surgeon: Ronnette Juniper, MD;  Location: [facility];  Service: Gastroenterology;;   CARDIAC CATHETERIZATION  03/03/2008   Left heart cardiac catheterization and coronary   CARDIAC CATHETERIZATION  1997   Dr Wynonia Lawman   CATARACT EXTRACTION W/ INTRAOCULAR LENS  IMPLANT, BILATERAL     2016   ESOPHAGEAL DILATION     2008   ESOPHAGOGASTRODUODENOSCOPY N/A 12/16/2020   Procedure: ESOPHAGOGASTRODUODENOSCOPY (EGD);  Surgeon: Ronnette Juniper, MD;  Location: [facility];  Service: Gastroenterology;  Laterality: N/A;   INNER EAR SURGERY     EAR INJECTION FOR MENIERES   KNEE ARTHROSCOPY  1991, 1995   LUMBAR LAMINECTOMY  12/2010    LUMBAR LAMINECTOMY/DECOMPRESSION MICRODISCECTOMY Left 08/15/2016   Procedure: Left Left three- four Redo laminectomy;  Surgeon: Erline Levine, MD;  Location: [facility];  Service: Neurosurgery;  Laterality: Left;  Left L3-4 Redo laminectomy   LUMBAR PERCUTANEOUS PEDICLE SCREW 2 LEVEL N/A 04/18/2016   Procedure: LUMBAR THREE-FOUR, LUMBAR FOUR-FIVE PERCUTANEOUS PEDICLE SCREW;  Surgeon: Erline Levine, MD;  Location: [facility];  Service: Neurosurgery;  Laterality: N/A;   TOTAL HIP ARTHROPLASTY Left 04/22/2017   TOTAL HIP ARTHROPLASTY Left 04/22/2017   Procedure: TOTAL HIP ARTHROPLASTY ANTERIOR APPROACH;  Surgeon: Melrose Nakayama, MD;  Location: [facility];  Service: Orthopedics;  Laterality: Left;   TOTAL KNEE ARTHROPLASTY Right 11/21/2020   Procedure: RIGHT TOTAL KNEE ARTHROPLASTY;  Surgeon: Melrose Nakayama, MD;  Location: [facility];  Service: Orthopedics;  Laterality: Right;    FAMILY HISTORY: Family History  Problem Relation Age of Onset   Prostate cancer Father    Heart attack Brother    Kidney cancer Brother    Brain cancer Brother     SOCIAL HISTORY: Social History   Socioeconomic History   Marital status: Widowed    Spouse name: Bethena Roys   Number of children: 3   Years of education: college   Highest education level: Not on file  Occupational History    Comment: Retired   Tobacco Use   Smoking status: Some Days    Types: Pipe   Smokeless tobacco: Never  Vaping Use   Vaping Use: Never used  Substance and Sexual Activity   Alcohol use: Yes    Alcohol/week: 1.0 standard drink of alcohol    Types: 1 Shots of liquor per week    Comment: occasionally   Drug use: No   Sexual activity: Not on file  Other Topics Concern   Not on file  Social History Narrative   Patient is retired and lives at home with his wife Bethena Roys.    Education college.   Caffeine one cup daily.   Social Determinants of Health   Financial Resource Strain: Not on file  Food Insecurity: Not on file  Transportation Needs: Not  on file  Physical Activity: Not on file  Stress: Not on file  Social Connections: Not on file  Intimate Partner Violence: Not on file    PHYSICAL EXAM  Vitals:   12/18/21 1425  BP: 115/75  Pulse: 80  Weight: 238 lb (108 kg)  Height: '5\' 10"'$  (1.778 m)   Body mass index is 34.15 kg/m.  Generalized: Well developed, in no acute distress  Neurological examination  Mentation: Alert oriented to time, place, history taking. Follows all commands speech and language fluent Cranial nerve II-XII: Pupils were equal round reactive to light. Extraocular movements were full, visual field were full on confrontational test. Facial sensation and strength were normal. Head turning and shoulder shrug  were normal and symmetric. Motor: 4/5 left hip flexion Sensory: Sensory testing is intact to soft touch on all 4 extremities.  No evidence of extinction is noted.  Coordination: Cerebellar testing reveals good finger-nose-finger and heel-to-shin bilaterally.  Gait and station: Gait is slightly wide-based, uses single-point cane, limp on the left Reflexes: Deep tendon reflexes are symmetric and normal bilaterally.   DIAGNOSTIC DATA (LABS, IMAGING, TESTING) - I reviewed patient records, labs, notes, testing and imaging myself where available.  Lab Results  Component Value Date   WBC 5.4 12/18/2020   HGB 8.9 (L) 12/18/2020   HCT 27.8 (L) 12/18/2020   MCV 105.7 (H) 12/18/2020   PLT 256 12/18/2020      Component Value Date/Time   NA 136 12/18/2020 0442   NA 138 04/29/2016 0000   K 3.9 12/18/2020 0442   CL 103 12/18/2020 0442   CO2 27 12/18/2020 0442   GLUCOSE 104 (H) 12/18/2020 0442   BUN 23 12/18/2020 0442   BUN 19 04/29/2016 0000   CREATININE 1.27 (H) 12/18/2020 0442   CALCIUM 8.2 (L) 12/18/2020 0442   PROT 6.4 04/25/2021 0837   ALBUMIN 3.7 12/17/2020 0544   ALBUMIN 4.4 01/13/2017 0846   AST 24 12/17/2020 0544   ALT 18 12/17/2020 0544   ALKPHOS 58 12/17/2020 0544   BILITOT 0.8 12/17/2020  0544   BILITOT 0.4 01/13/2017 0846   GFRNONAA 56 (L) 12/18/2020 0442   GFRAA 59 (L) 04/14/2017 1000   Lab Results  Component Value Date   CHOL 90 (L) 01/13/2017   HDL 29 (L) 01/13/2017   LDLCALC 33 01/13/2017   TRIG 138 01/13/2017   CHOLHDL 3.8 01/18/2016   Lab Results  Component Value Date   HGBA1C 5.6 04/25/2021   Lab Results  Component Value Date   KHTXHFSF42 395 12/17/2020   Lab Results  Component Value Date   TSH 2.723 12/17/2020    Butler Denmark, AGNP-C, DNP 12/18/2021, 3:19 PM [facility] [address] [place], [place] [address] [phone]

## 2021-12-18 ENCOUNTER — Ambulatory Visit: Payer: Medicare Other | Admitting: Neurology

## 2021-12-18 VITALS — BP 115/75 | HR 80 | Ht 70.0 in | Wt 238.0 lb

## 2021-12-18 DIAGNOSIS — G6289 Other specified polyneuropathies: Secondary | ICD-10-CM | POA: Diagnosis not present

## 2021-12-18 DIAGNOSIS — G2581 Restless legs syndrome: Secondary | ICD-10-CM

## 2021-12-18 MED ORDER — ROPINIROLE HCL 0.5 MG PO TABS: 0.5000 mg | ORAL_TABLET | Freq: Every evening | ORAL | 3 refills | Status: DC

## 2021-12-18 MED ORDER — PREGABALIN 100 MG PO CAPS: 400.0000 mg | ORAL_CAPSULE | Freq: Every day | ORAL | 5 refills | Status: DC

## 2021-12-18 MED ORDER — DULOXETINE HCL 60 MG PO CPEP: 60.0000 mg | ORAL_CAPSULE | Freq: Every day | ORAL | 4 refills | Status: DC

## 2021-12-18 NOTE — Patient Instructions (Addendum)
Try spreading out of the dose of Lyrica to 1 tablet 3 times daily to see if benefit during the day  I will order physical therapy for gait and balance

## 2021-12-20 ENCOUNTER — Telehealth: Payer: Self-pay | Admitting: Neurology

## 2021-12-20 NOTE — Telephone Encounter (Signed)
Referral sent to [place]

## 2022-01-08 DIAGNOSIS — D509 Iron deficiency anemia, unspecified: Secondary | ICD-10-CM | POA: Diagnosis not present

## 2022-01-08 DIAGNOSIS — R262 Difficulty in walking, not elsewhere classified: Secondary | ICD-10-CM | POA: Diagnosis not present

## 2022-01-08 DIAGNOSIS — I4819 Other persistent atrial fibrillation: Secondary | ICD-10-CM | POA: Diagnosis not present

## 2022-01-11 DIAGNOSIS — R5381 Other malaise: Secondary | ICD-10-CM | POA: Diagnosis not present

## 2022-01-11 DIAGNOSIS — D509 Iron deficiency anemia, unspecified: Secondary | ICD-10-CM | POA: Diagnosis not present

## 2022-01-11 DIAGNOSIS — R29898 Other symptoms and signs involving the musculoskeletal system: Secondary | ICD-10-CM | POA: Diagnosis not present

## 2022-01-17 DIAGNOSIS — R2689 Other abnormalities of gait and mobility: Secondary | ICD-10-CM | POA: Diagnosis not present

## 2022-01-17 DIAGNOSIS — M6281 Muscle weakness (generalized): Secondary | ICD-10-CM | POA: Diagnosis not present

## 2022-01-24 DIAGNOSIS — R2689 Other abnormalities of gait and mobility: Secondary | ICD-10-CM | POA: Diagnosis not present

## 2022-01-24 DIAGNOSIS — M6281 Muscle weakness (generalized): Secondary | ICD-10-CM | POA: Diagnosis not present

## 2022-01-25 ENCOUNTER — Encounter: Payer: Self-pay | Admitting: Neurology

## 2022-01-28 NOTE — Telephone Encounter (Signed)
Drug registry shows last refill for Clonazepam as 06/11/2021 # 30; refills expired since over 6 months.   Will fwd to NP. Last f/u 12/18/2021.

## 2022-01-31 DIAGNOSIS — M6281 Muscle weakness (generalized): Secondary | ICD-10-CM | POA: Diagnosis not present

## 2022-01-31 DIAGNOSIS — R2689 Other abnormalities of gait and mobility: Secondary | ICD-10-CM | POA: Diagnosis not present

## 2022-02-06 MED ORDER — CLONAZEPAM 0.5 MG PO TABS: ORAL_TABLET | ORAL | 0 refills | Status: DC

## 2022-02-06 NOTE — Addendum Note (Signed)
Addended by: Suzzanne Cloud on: 02/06/2022 12:18 PM   Modules accepted: Orders

## 2022-02-24 ENCOUNTER — Other Ambulatory Visit: Payer: Self-pay | Admitting: Neurology

## 2022-02-25 DIAGNOSIS — E782 Mixed hyperlipidemia: Secondary | ICD-10-CM | POA: Diagnosis not present

## 2022-02-25 DIAGNOSIS — I10 Essential (primary) hypertension: Secondary | ICD-10-CM | POA: Diagnosis not present

## 2022-02-25 DIAGNOSIS — N1831 Chronic kidney disease, stage 3a: Secondary | ICD-10-CM | POA: Diagnosis not present

## 2022-03-01 ENCOUNTER — Other Ambulatory Visit: Payer: Self-pay | Admitting: Cardiology

## 2022-03-01 DIAGNOSIS — Z0000 Encounter for general adult medical examination without abnormal findings: Secondary | ICD-10-CM | POA: Diagnosis not present

## 2022-03-01 DIAGNOSIS — Z23 Encounter for immunization: Secondary | ICD-10-CM | POA: Diagnosis not present

## 2022-03-01 NOTE — Telephone Encounter (Signed)
Prescription refill request for Eliquis received. Indication:Afib Last office visit:12/22 Scr:1.5 Age: 81 Weight:108 kg  Under review for dose decrease.

## 2022-03-08 ENCOUNTER — Encounter: Payer: Self-pay | Admitting: Neurology

## 2022-03-11 NOTE — Telephone Encounter (Signed)
[place] drug registry reviewed last refill 02/14/2022 # 120 for a 30 day supply.   He has been taking an additional 100 mg daily equaling 500. Judson Roch is out of the office today will ask Dr. [name] to refill in her absence.   Judson Roch would like to see the pt in follow up before changing the rx. I have asked the pt to schedule a follow visit.

## 2022-03-12 ENCOUNTER — Encounter: Payer: Self-pay | Admitting: Neurology

## 2022-03-12 ENCOUNTER — Telehealth (INDEPENDENT_AMBULATORY_CARE_PROVIDER_SITE_OTHER): Payer: Medicare Other | Admitting: Neurology

## 2022-03-12 DIAGNOSIS — G6289 Other specified polyneuropathies: Secondary | ICD-10-CM

## 2022-03-12 DIAGNOSIS — G2581 Restless legs syndrome: Secondary | ICD-10-CM

## 2022-03-12 MED ORDER — PREGABALIN 100 MG PO CAPS: 400.0000 mg | ORAL_CAPSULE | Freq: Every day | ORAL | 5 refills | Status: DC

## 2022-03-12 NOTE — Progress Notes (Signed)
Virtual Visit via Video Note  I connected with [name] on 03/12/22 at  2:45 PM EDT by a video enabled telemedicine application and verified that I am speaking with the correct person using two identifiers.  Location: Patient: at his home Provider: in the office    I discussed the limitations of evaluation and management by telemedicine and the availability of in person appointments. The patient expressed understanding and agreed to proceed.  History of Present Illness:  HISTORY  [name] is a 81 yo RH male, accompanied by his wife, referred by Dr. Jacelyn Grip, and his primary care physician Dr. Leonides Schanz for evaluation of chronic low back pain, bilateral lower extremity muscle ache  He had a past medical history of hypertension, hyperlipidemia, presenting with chronic low back pain, bilateral lower extremity muscle achy pain since 2010.   Initially it was thought due to his statin treatment, he has tried different statin without improving his symptoms,over the years, he also received multiple lumbar bilateral facet joint fluoroscopy guided injection without improving his symptoms.   In 2012, he was diagnosed with L4 and 5 lumbar stenosis, had lumbar laminectomy L3-4, L4-L5 by Dr. [name] in July 2012,without improving his symptoms,  He was seen by different specialists, tried different medications, this including Elavil, Xanax as needed, he is currently taking hydrocodone 5/325 mg 1-2 tablets every night, Xanax 0.25 milligram as needed, Flexeril 10 mg every night, gabapentin 600 mg every night without significant improvement,  During the daytime, he denies significant low back pain, or bilateral lower extremity pain, he does has history of right ankle fracture, mild gait difficulty due to right ankle pain, but starting at evening time, he began to noticed deep achy pain starting from midline low back, radiating along bilateral lateral leg, constant, he tends to pace around, has difficulty  sleeping at nighttime, sometimes he has to got up take a hot bath couple times every night, put ice on his back, on his feet, only provide temporary relief.  He has also tried acupuncture, massage, TENS unit, without significant improvement, previously, he has a short trial of low-dose Requip, without improving his symptoms, no significant side effect noticed either.  He has bilateral lateral 3 toes numbness, no weakness, no bilateral fingertips numbness, or weakness  Per record, electrodiagnostic study failed to demonstrate significant etiology,  We have reviewed MRI lumbar in 2012, there was evidence of L4-5 spinal stenosis, and the most recent MRI  Lumbar was  in June 2014,progression of multilevel facet arthropathy, status post a laminectomy at L3-4, with residual large synovial cyst extending along the posterior epidural space without significant central canal stenosis, mild foraminal narrowing bilaterally at L3 and 4, progression of mild lateral recess narrowing bilaterally at L4-5, foraminal narrowing is stable at this level  MRI of the brain in June 2014 showed mild atrophy, no acute intracranial abnormality   UPDATE Dec 15th 2015:   He is tolerating gabapentin 300 mg 2 tablets every night, Requip 1 mg 2 tablets every night, he can sleep better with the medications, but continue complains of bilateral lower extremity dull achy pain, numbness, urgency to move, especially at nighttime, He also complains of slow progressive gait difficulty, grasps things for support, he has urinary urgency, but no incontinence.   UPDATE Jan 29th 2016: He is now taking gabapentin '300mg'$   2 tabs qhs and requip '1mg'$ , 3 tabs qhs, which has been very helpful,  30%, improvement, he still has bilatarel leg pain at evening,  stretching,massage,  He denies bowel and bladder incontinence , he has tinlging at the soles of his feet.    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   He continue complaining significant difficulty at nighttime, came in with a full page list of symptoms, in the morning time, he denies significant back pain, no low back pain, symptoms started in the late afternoon, gradually building up, around 9: to10 PM, he noticed achy feeling down lateral side of both leg, increased by sitting still, intense urge to lie on the floor, stretching muscle backwards, late night hot soaking bath to help him sleep, he has to take about 3 AM last night, difficulty sleeping, excessive fatigue during the daytime, could not sit through movies, standing up in the aisle 2/3 of the movie He is now taking, hydrocodone  5/'325mg'$  2 tabs 9pm, advil '200mg'$  2 tab    He has quit taking Requip 1 mg, up to 4 tablets every night, also gabapentin, complains of upset stomach, without relieving his symptoms, he is now taking Advil 200 mg 2 tablets as needed, hydrocodone/Tylenol 5/325 mg 2 tablets every night for symptoms control, which only mild, temporarily, Tylenol helps some too.   UPDATE April 29th 2016: Laboratory showed low iron, ferritin level at 9, he was put on iron supplement, his restless leg symptoms has much improved, He is now taking clonazepam 0.5 milligram every night, Lyrica 50 mg every night, he can sleep much better,   He continue, combat right ankle pain   UPDATE August 2nd 2016: He sleeps well, he is now taking clonazepam 0.'5mg'$  qhs, Lyrica '50mg'$  3 tabs qhs, no longer on  Hydrocodone, he complains of side effect   UPDATE August 06 2016: I reviewed operation record on April 18 2016, he had right lumbar L3-4, L4-5 anterolateral lumbar interbody fusion, percutaneous pedicle screw on April 18 2016, which has  helped his left-sided low back pain radiating pain to left lower extremity, 4. Of time he was almost pain-free, unfortunately he fell landed on his right side, is then he began to experience excruciating radiating pain from left side to left anterior thigh.   We have personally reviewed and compared to MRI lumbar in October 2017, and in February 2018. Which showed pedicle screw and interbody fusion at L3-4, L4-5 without stenosis, small fluid collection posterior to the thecal sac at L3-4, moderate right foraminal narrowing, mild foraminal narrowing at L4-5.   He is planning on to have second left-sided lumbar decompression surgery on August 15 2016   His restless leg symptoms is under good control with current dose of Lyrica 100 mg twice a day, clonazepam as needed, oxycodone 5 mg every night   His most recent ferritin level was 47, hemoglobin was 11 point 5,   UPDATE August 07 2017: He is accompanied by his wife at today's clinical visit, complaining worsening restless leg symptoms, for a while, his restless leg symptoms seems to be under okay control.   Since 2017, he had multiple surgery, lumbar fusion by Dr. 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  Sometimes he has to take hot bath in the middle of the night to help him go to sleep.   UPDATE Sept 9 2019: Now he is taking generic Lyric '100mg'$  3 tabs qhs, Requip 0.5 mg every night,  20% of time he is able to sleep without any difficulties, but 80% of the time about 1 to 2 hours after he goes to bed, he has severe  leg discomfort, has to take a hot bath, then was able to sleep the rest of the night, sometimes he took clonazepam 0.5 mg half to 1 tablets before bedtime, complains of excessive drowsiness even to the point of wetting his bed,     His ferritin level is low 15 ng/ml March 2019, last colonoscopy was in 2017, hemoglobin in November 2018 prior to his left hip surgery was 13, he is now on iron supplement   UPDATE August 10 2018: Ferritin in Sept 2019 was 49.  He continues to have restless leg symptoms, halftime in a week, he has trouble sleeping due to restless leg symptoms, to get up to take a hot shower, which usually is very helpful, is taking Lyrica 100 in the morning, 200 mg at 2 hours prior to sleep, also take iron supplement,   UPDATE August 11 2019: He has lost few dear family member over short period of time, is feeling sad.  Around February 2021, he felt like his restless leg symptoms is under good control, decided to taper down Lyrica doses, instead of 100 mg 3 tablets every night, he tapered down to 100 mg every night, gradually increased difficulty sleeping, difficult to find a comfortable position for his leg, take hot bath, clonazepam 0.5 mg as needed was helpful last night, he also takes Cymbalta 60 mg daily, Requip 0.5 mg at bedtime   Laboratory evaluations in December 2020, normal CBC hemoglobin 14.3, CMP, creatinine of 1.2, ferritin was 15 August 2017, after iron supplement, improved to 49   CT abdomen in December 2020 showed no acute abnormality, large paraesophageal hiatal hernia, with approximately one half of the stomach present in the chest, diffuse colonic diverticulosis, without evidence of acute diverticulitis, mild diffuse hepatic steatosis, marked prostate gland enlargement, with enhancing nodule arising from the superior portion of the median lobe of the prostate gland.   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 based on symptoms alone without looking at his skin lesion,   He denies a previous history of shingles, had a Shingrix vaccination end of 2020, did reported history of left upper thoracic rash, itching few months ago   His restless leg symptoms is under good control with current regimen, Lyrica 100 mg 3 tablets every night, Requip 0.5 mg every night, Cymbalta 60 mg every morning, clonazepam 0.5 mg as needed   He has  history of chronic low back pain, more to left side, had a history of lumbar decompression surgery in the past, left hip replacement in 2018, did have left hip injury within a week of replacement surgery, pain has gradually improved, also had a history of right ankle surgery, had gait abnormality.   UPDATE Sept 12 2022: He lost his wife in March 2022, going through grieving, had right knee replacement in June 2021, during the rehab, he was found to have dark stool, epigastric discomfort, using NSAIDs for right knee pain, also taking Eliquis for atrial fibrillation  Reviewed hospital record CT showed duodenitis, had active GI bleeding due to duodenal ulcer, hemoglobin of 8.9 required blood transfusion,   He is planning on moving to independent living, current medicine works well for his restless leg   He also complains of worsening bilateral feet numbness tingling   Update April 25, 2021: His restless leg symptoms overall is under good control with his current medications, Lyrica 100 mg 3 tablets every night, clonazepam as needed, he rarely uses it, Cymbalta 60 mg every day,  He return for electrodiagnostic study today, which did  show evidence of moderate axonal peripheral neuropathy, in addition there is evidence of chronic bilateral lumbosacral radiculopathy.  Reviewed laboratory evaluation in July 2022, when he was admitted to the hospital for GI bleeding, hemoglobin was 8.9, normal TSH, BMP, with creatinine of 1.27 calcium was 8.2, GFR of 56  Update December 18, 2021 SS: Labs in November 2022 A1c 5.6, MM panel negative, ANA negative, ferritin 54. Reports more tingling up to almost knee level.  Uses cane, has bad right knee, left hip. At times feels at risk for falling, variety of factors. Living at [facility]. Taking 400 mg Lyrica at night, Klonopin 0.5 mg PRN at bedtime only once a month, Cymbalta 60 mg daily, Requip 0.5 mg at bedtime. No more RLS symptoms. Takes iron tablet. Had virtual visit with PCP today for depression, no medications added.   Update March 12, 2022 SS: VV today, has been taking Lyrica 100 mg AM, 400 mg PM, this was helpful for neuropathy symptoms, he misunderstood instructions to spread out dosing. Still some tingling in legs, gait instability (this is not worse). Is sleeping well, still on Requip 0.5 mg at bedtime, Cymbalta 60 mg daily. Not using Klonopin at all. Is now sleeping great, he has a lady friend. Living at [facility] apartment for 5 months now. BP has been running 130/80's. Is still taking Iron, PCP has been following. No falls.   Observations/Objective: Via virtual visit, alert and oriented, speech is clear and concise, facial symmetry noted, moves about freely, gait appears slightly wide-based but steady and independent  Assessment and Plan: 1.  Peripheral neuropathy 2.  Restless leg syndrome 3.  Anemia-on iron supplement -Symptoms under overall good control -Continue Lyrica up to 400 mg daily, we discussed even trying to reduce the dose -Continue Requip 0.5 mg at bedtime, Cymbalta 60 mg daily -No longer using Klonopin  Follow Up Instructions: 6 months  09/18/22 1:15   I discussed the assessment and treatment plan with the patient. The patient was provided an opportunity to ask questions and all were answered. The patient agreed with the plan and demonstrated an understanding of the instructions.   The patient was advised to call back or seek an in-person evaluation if the symptoms worsen or if the condition fails to improve as anticipated.  Butler Denmark, Laqueta Jean, [facility]  [address] [place], [place] [address] [phone]

## 2022-03-14 DIAGNOSIS — H6690 Otitis media, unspecified, unspecified ear: Secondary | ICD-10-CM | POA: Diagnosis not present

## 2022-03-14 DIAGNOSIS — J40 Bronchitis, not specified as acute or chronic: Secondary | ICD-10-CM | POA: Diagnosis not present

## 2022-03-17 ENCOUNTER — Encounter (HOSPITAL_COMMUNITY): Payer: Self-pay | Admitting: *Deleted

## 2022-03-17 ENCOUNTER — Emergency Department (HOSPITAL_COMMUNITY): Payer: Medicare Other

## 2022-03-17 ENCOUNTER — Emergency Department (HOSPITAL_COMMUNITY)
Admission: EM | Admit: 2022-03-17 | Discharge: 2022-03-17 | Discharge status: Against Medical Advice | Payer: Medicare Other | Attending: Physician Assistant | Admitting: Physician Assistant

## 2022-03-17 DIAGNOSIS — Z20822 Contact with and (suspected) exposure to covid-19: Secondary | ICD-10-CM | POA: Insufficient documentation

## 2022-03-17 DIAGNOSIS — R112 Nausea with vomiting, unspecified: Secondary | ICD-10-CM | POA: Diagnosis not present

## 2022-03-17 DIAGNOSIS — R110 Nausea: Secondary | ICD-10-CM | POA: Diagnosis not present

## 2022-03-17 DIAGNOSIS — Z743 Need for continuous supervision: Secondary | ICD-10-CM | POA: Diagnosis not present

## 2022-03-17 DIAGNOSIS — R059 Cough, unspecified: Secondary | ICD-10-CM | POA: Insufficient documentation

## 2022-03-17 DIAGNOSIS — R0602 Shortness of breath: Secondary | ICD-10-CM | POA: Diagnosis not present

## 2022-03-17 DIAGNOSIS — R404 Transient alteration of awareness: Secondary | ICD-10-CM | POA: Diagnosis not present

## 2022-03-17 DIAGNOSIS — Z5321 Procedure and treatment not carried out due to patient leaving prior to being seen by health care provider: Secondary | ICD-10-CM | POA: Insufficient documentation

## 2022-03-17 DIAGNOSIS — R0902 Hypoxemia: Secondary | ICD-10-CM | POA: Diagnosis not present

## 2022-03-17 DIAGNOSIS — K449 Diaphragmatic hernia without obstruction or gangrene: Secondary | ICD-10-CM | POA: Diagnosis not present

## 2022-03-17 LAB — COMPREHENSIVE METABOLIC PANEL
ALT: 35 U/L (ref 0–44)
AST: 52 U/L — ABNORMAL HIGH (ref 15–41)
Albumin: 4.5 g/dL (ref 3.5–5.0)
Alkaline Phosphatase: 53 U/L (ref 38–126)
Anion gap: 10 (ref 5–15)
BUN: 22 mg/dL (ref 8–23)
CO2: 25 mmol/L (ref 22–32)
Calcium: 9.3 mg/dL (ref 8.9–10.3)
Chloride: 105 mmol/L (ref 98–111)
Creatinine, Ser: 1.3 mg/dL — ABNORMAL HIGH (ref 0.61–1.24)
GFR, Estimated: 55 mL/min — ABNORMAL LOW (ref >60)
Glucose, Bld: 103 mg/dL — ABNORMAL HIGH (ref 70–99)
Potassium: 4.3 mmol/L (ref 3.5–5.1)
Sodium: 140 mmol/L (ref 135–145)
Total Bilirubin: 1.2 mg/dL (ref 0.3–1.2)
Total Protein: 7.4 g/dL (ref 6.5–8.1)

## 2022-03-17 LAB — CBC WITH DIFFERENTIAL/PLATELET
Abs Immature Granulocytes: 0.03 10*3/uL (ref 0.00–0.07)
Basophils Absolute: 0.1 10*3/uL (ref 0.0–0.1)
Basophils Relative: 1 %
Eosinophils Absolute: 0.1 10*3/uL (ref 0.0–0.5)
Eosinophils Relative: 1 %
HCT: 48.3 % (ref 39.0–52.0)
Hemoglobin: 15.9 g/dL (ref 13.0–17.0)
Immature Granulocytes: 0 %
Lymphocytes Relative: 28 %
Lymphs Abs: 2.1 10*3/uL (ref 0.7–4.0)
MCH: 32.4 pg (ref 26.0–34.0)
MCHC: 32.9 g/dL (ref 30.0–36.0)
MCV: 98.6 fL (ref 80.0–100.0)
Monocytes Absolute: 0.5 10*3/uL (ref 0.1–1.0)
Monocytes Relative: 6 %
Neutro Abs: 4.9 10*3/uL (ref 1.7–7.7)
Neutrophils Relative %: 64 %
Platelets: 177 10*3/uL (ref 150–400)
RBC: 4.9 MIL/uL (ref 4.22–5.81)
RDW: 13.6 % (ref 11.5–15.5)
WBC: 7.7 10*3/uL (ref 4.0–10.5)
nRBC: 0 % (ref 0.0–0.2)

## 2022-03-17 LAB — URINALYSIS, ROUTINE W REFLEX MICROSCOPIC
Bilirubin Urine: NEGATIVE
Glucose, UA: NEGATIVE mg/dL
Hgb urine dipstick: NEGATIVE
Ketones, ur: 5 mg/dL — AB
Leukocytes,Ua: NEGATIVE
Nitrite: NEGATIVE
Protein, ur: NEGATIVE mg/dL
Specific Gravity, Urine: 1.011 (ref 1.005–1.030)
pH: 6 (ref 5.0–8.0)

## 2022-03-17 LAB — LIPASE, BLOOD: Lipase: 28 U/L (ref 11–51)

## 2022-03-17 LAB — RESP PANEL BY RT-PCR (FLU A&B, COVID) ARPGX2
Influenza A by PCR: NEGATIVE
Influenza B by PCR: NEGATIVE
SARS Coronavirus 2 by RT PCR: NEGATIVE

## 2022-03-17 LAB — TROPONIN I (HIGH SENSITIVITY): Troponin I (High Sensitivity): 7 ng/L (ref <18)

## 2022-03-17 LAB — BRAIN NATRIURETIC PEPTIDE: B Natriuretic Peptide: 92.5 pg/mL (ref 0.0–100.0)

## 2022-03-17 NOTE — ED Provider Triage Note (Signed)
Emergency Medicine Provider Triage Evaluation Note  [name] , a 81 y.o. male  was evaluated in triage.  Pt complains of SOB x 10 days. On Doxy for 3 days without relief. N/V since yesterday. No hx of CHF  Review of Systems  Positive: Cough, SOB, N/V Negative:   Physical Exam  There were no vitals taken for this visit. Gen:   Awake, no distress   Resp:  Normal effort  MSK:   Moves extremities without difficulty  Other:    Medical Decision Making  Medically screening exam initiated at 11:11 AM.  Appropriate orders placed.  [name] was informed that the remainder of the evaluation will be completed by another provider, this initial triage assessment does not replace that evaluation, and the importance of remaining in the ED until their evaluation is complete.  SOB, N/V   [name] A, PA-C 03/17/22 1112

## 2022-03-17 NOTE — ED Triage Notes (Signed)
BIB EMS, SHOB x 10 days, has been seen at [place] area, started on Doxy, started to have N/V yesterday. #20 Rt AC 4 mg IV Zofran, History of A fib. CBG 121 150/80-70 irr, 98% 2 L (02 was placed by EMS normally does not use 02)

## 2022-03-17 NOTE — ED Notes (Signed)
Pt asked for IV to be removed so he can leave.

## 2022-03-18 DIAGNOSIS — R031 Nonspecific low blood-pressure reading: Secondary | ICD-10-CM | POA: Diagnosis not present

## 2022-03-18 DIAGNOSIS — J40 Bronchitis, not specified as acute or chronic: Secondary | ICD-10-CM | POA: Diagnosis not present

## 2022-03-25 ENCOUNTER — Telehealth: Payer: Self-pay

## 2022-03-25 NOTE — Telephone Encounter (Signed)
     Patient  visit on 10/15  at [place] you been able to follow up with your primary care physician? yes  The patient was or was not able to obtain any needed medicine or equipment. yes  Are there diet recommendations that you are having difficulty following? na  Patient expresses understanding of discharge instructions yes     [place]  [phone] [address], [place], [place] [address] Phone: [phone] Email: [email or web address]

## 2022-04-09 DIAGNOSIS — E782 Mixed hyperlipidemia: Secondary | ICD-10-CM | POA: Diagnosis not present

## 2022-04-09 DIAGNOSIS — Z79899 Other long term (current) drug therapy: Secondary | ICD-10-CM | POA: Diagnosis not present

## 2022-04-09 DIAGNOSIS — K219 Gastro-esophageal reflux disease without esophagitis: Secondary | ICD-10-CM | POA: Diagnosis not present

## 2022-04-09 DIAGNOSIS — G2581 Restless legs syndrome: Secondary | ICD-10-CM | POA: Diagnosis not present

## 2022-04-09 DIAGNOSIS — D509 Iron deficiency anemia, unspecified: Secondary | ICD-10-CM | POA: Diagnosis not present

## 2022-04-09 DIAGNOSIS — K5900 Constipation, unspecified: Secondary | ICD-10-CM | POA: Diagnosis not present

## 2022-04-09 DIAGNOSIS — R7301 Impaired fasting glucose: Secondary | ICD-10-CM | POA: Diagnosis not present

## 2022-04-09 DIAGNOSIS — I10 Essential (primary) hypertension: Secondary | ICD-10-CM | POA: Diagnosis not present

## 2022-04-09 DIAGNOSIS — Z8719 Personal history of other diseases of the digestive system: Secondary | ICD-10-CM | POA: Diagnosis not present

## 2022-04-09 DIAGNOSIS — R7309 Other abnormal glucose: Secondary | ICD-10-CM | POA: Diagnosis not present

## 2022-04-09 DIAGNOSIS — D6869 Other thrombophilia: Secondary | ICD-10-CM | POA: Diagnosis not present

## 2022-04-09 DIAGNOSIS — I4819 Other persistent atrial fibrillation: Secondary | ICD-10-CM | POA: Diagnosis not present

## 2022-07-01 DIAGNOSIS — N1831 Chronic kidney disease, stage 3a: Secondary | ICD-10-CM | POA: Diagnosis not present

## 2022-07-01 DIAGNOSIS — I10 Essential (primary) hypertension: Secondary | ICD-10-CM | POA: Diagnosis not present

## 2022-07-01 DIAGNOSIS — E782 Mixed hyperlipidemia: Secondary | ICD-10-CM | POA: Diagnosis not present

## 2022-07-01 DIAGNOSIS — K219 Gastro-esophageal reflux disease without esophagitis: Secondary | ICD-10-CM | POA: Diagnosis not present

## 2022-07-03 ENCOUNTER — Ambulatory Visit: Payer: Medicare Other | Admitting: Neurology

## 2022-07-12 NOTE — Progress Notes (Unsigned)
[name] Date of Birth: Aug 18, 1940 Medical Record [id number]  History of Present Illness: [name] is seen for evaluation of new onset Afib. He has a history of mixed hyperlipidemia, HTN, and obesity.  He has been intolerant to statins due to severe myalgias. This includes lipitor at a dose of 20 mg daily and Crestor 10 mg 3 days a week. Welchol apparently made no change in his lipid levels. Niacin was associated with severe flushing.   He has no history of vascular disease. He had normal cardiac caths in 1998 and 2009. No history of PAD or CVA/TIA.  He apparently had afib in 1962 but no documented recurrence afterwards.   He was seen recently by Dr Addison Lank for routine physical and found to be in Afib with controlled rate. Not symptomatic. Started on anticoagulation with Eliquis. Echo showed Normal LV and valvular function. Moderate LAE.  He underwent right TKR in June.   In July he was admitted with GI bleed. Hgb down to 8.3. transfused one unit of blood. EGD showed a large 10 cm hiatal hernia and multiple duodenal ulcers. NSAIDs discontinued and started on PPI.   On follow up lab work in Oct Hgb was back to normal.  He feels well. Can't tell he is in Afib. No chest pain or SOB. Has peripheral neuropathy. Managed by Dr [name]. Getting ready to move to [facility].   Current Outpatient Medications on File Prior to Visit  Medication Sig Dispense Refill   acetaminophen (TYLENOL) 325 MG tablet Take 2 tablets (650 mg total) by mouth every 6 (six) hours as needed for mild pain (or Fever >/= 101).     carvedilol (COREG) 3.125 MG tablet Take 3.125 mg by mouth at bedtime.      clonazePAM (KLONOPIN) 0.5 MG tablet take 1/2-1 TABLET BY MOUTH EVERYDAY AT BEDTIME AS NEEDED 20 tablet 0   DULoxetine (CYMBALTA) 60 MG capsule Take 1 capsule (60 mg total) by mouth daily. 90 capsule 4   ELIQUIS 5 MG TABS tablet TAKE ONE TABLET BY MOUTH TWICE DAILY 180 tablet 0   ferrous sulfate 324 (65 Fe) MG TBEC Take 324  mg by mouth daily.     Multiple Vitamins-Minerals (MULTIVITAMIN PO) Take 1 tablet by mouth daily.     nitroGLYCERIN (NITROSTAT) 0.4 MG SL tablet Place 0.4 mg under the tongue every 5 (five) minutes as needed for chest pain.     pantoprazole (PROTONIX) 40 MG tablet Take 1 tablet (40 mg total) by mouth 2 (two) times daily before a meal. (Patient taking differently: Take 40 mg by mouth 2 (two) times daily before a meal. TAPERING DOWN.TAKING 1 TAB BID X 7 DAYS, THEN 1 TAB DAILY FOR 5 DAYS A WEEK.) 60 tablet 1   polyethylene glycol (MIRALAX / GLYCOLAX) packet Take 17 g by mouth daily as needed for mild constipation.     pregabalin (LYRICA) 100 MG capsule Take 4 capsules (400 mg total) by mouth at bedtime. 120 capsule 5   rOPINIRole (REQUIP) 0.5 MG tablet Take 1 tablet (0.5 mg total) by mouth every evening. 90 tablet 3   rosuvastatin (CRESTOR) 10 MG tablet TAKE 1 TABLET ONCE DAILY OR AS DIRECTED. 90 tablet 2   tamsulosin (FLOMAX) 0.4 MG CAPS capsule Take 0.4 mg by mouth daily.     traMADol (ULTRAM) 50 MG tablet Take 1 tablet (50 mg total) by mouth every 6 (six) hours as needed for moderate pain. 20 tablet 0   No current facility-administered medications on  file prior to visit.    Allergies  Allergen Reactions   Compazine [Prochlorperazine Edisylate] Other (See Comments)    EXTRAPYRAMIDAL MOVEMENT [Involuntary muscle movement]   Penicillins Nausea And Vomiting, Rash and Other (See Comments)    Has patient had a PCN reaction causing immediate rash, facial/tongue/throat swelling, SOB or lightheadedness with hypotension: #  #  #  YES  #  #  #  Has patient had a PCN reaction causing severe rash involving mucus membranes or skin necrosis: No Has patient had a PCN reaction that required hospitalization No Has patient had a PCN reaction occurring within the last 10 years: No If all of the above answers are "NO", then may proceed with Cephalosporin use.    Welchol [Colesevelam Hcl] Other (See Comments)     MYALGIAS [Leg aches]   Gemfibrozil Nausea Only    PATIENT TOLERATES   Red Yeast Rice [Cholestin] Other (See Comments)    flushing    Past Medical History:  Diagnosis Date   Anginal pain ([place])    NONE IN 3 YEARS   Arthritis    Atrial fibrillation ([place]) 1962   a. No recurrence since 1962.   Chest pain    a. 1998 Cath: nl cors;  b. 2009 Cath: nl cors.   Depression    Dysrhythmia    NO TROUBLE IN 1 YR    DR. Brion Hedges [place]    Esophageal reflux    Essential hypertension    History of Paroxysmal atrial flutter (H[facility])    Hyperlipidemia    Incontinence of urine    Leg pain, bilateral    Nocturia    PONV (postoperative nausea and vomiting)    CONSTIPATED   Scarlet fever 1945   Spinal stenosis    Staph skin infection    LEFT GROIN  04/11/16  TX W/ DOXYCYCLINE   Ulcerative proctitis ([place])     Past Surgical History:  Procedure Laterality Date   ANKLE RECONSTRUCTION Right 2007   [place]   ANTERIOR LAT LUMBAR FUSION Right 04/18/2016   Procedure: RIGHT LUMBAR THREE-FOUR, LUMBAR FOUR-FIVE ANTEROLATERAL LUMBAR INTERBODY FUSION;  Surgeon: Erline Levine, MD;  Location: [place];  Service: Neurosurgery;  Laterality: Right;  RIGHT L3-4 L4-5 ANTEROLATERAL LUMBAR INTERBODY FUSION   BIOPSY  12/16/2020   Procedure: BIOPSY;  Surgeon: Ronnette Juniper, MD;  Location: [facility];  Service: Gastroenterology;;   CARDIAC CATHETERIZATION  03/03/2008   Left heart cardiac catheterization and coronary   CARDIAC CATHETERIZATION  1997   Dr Wynonia Lawman   CATARACT EXTRACTION W/ INTRAOCULAR LENS  IMPLANT, BILATERAL     2016   ESOPHAGEAL DILATION     2008   ESOPHAGOGASTRODUODENOSCOPY N/A 12/16/2020   Procedure: ESOPHAGOGASTRODUODENOSCOPY (EGD);  Surgeon: Ronnette Juniper, MD;  Location: [facility];  Service: Gastroenterology;  Laterality: N/A;   INNER EAR SURGERY     EAR INJECTION FOR MENIERES   KNEE ARTHROSCOPY  1991, 1995   LUMBAR LAMINECTOMY  12/2010   LUMBAR LAMINECTOMY/DECOMPRESSION MICRODISCECTOMY Left 08/15/2016    Procedure: Left Left three- four Redo laminectomy;  Surgeon: Erline Levine, MD;  Location[place];  Service: Neurosurgery;  Laterality: Left;  Left L3-4 Redo laminectomy   LUMBAR PERCUTANEOUS PEDICLE SCREW 2 LEVEL N/A 04/18/2016   Procedure: LUMBAR THREE-FOUR, LUMBAR FOUR-FIVE PERCUTANEOUS PEDICLE SCREW;  Surgeon: Erline Levine, MD;  Location: [place];  Service: Neurosurgery;  Laterality: N/A;   TOTAL HIP ARTHROPLASTY Left 04/22/2017   TOTAL HIP ARTHROPLASTY Left 04/22/2017   Procedure: TOTAL HIP ARTHROPLASTY ANTERIOR APPROACH;  Surgeon: Melrose Nakayama, MD;  Location: [place];  Service: Orthopedics;  Laterality: Left;   TOTAL KNEE ARTHROPLASTY Right 11/21/2020   Procedure: RIGHT TOTAL KNEE ARTHROPLASTY;  Surgeon: Melrose Nakayama, MD;  Location: [facility];  Service: Orthopedics;  Laterality: Right;    Social History   Tobacco Use  Smoking Status Some Days   Types: Pipe  Smokeless Tobacco Never    Social History   Substance and Sexual Activity  Alcohol Use Yes   Alcohol/week: 1.0 standard drink of alcohol   Types: 1 Shots of liquor per week   Comment: occasionally    Family History  Problem Relation Age of Onset   Prostate cancer Father    Heart attack Brother    Kidney cancer Brother    Brain cancer Brother     Review of Systems: As noted in HPI.  All other systems were reviewed and are negative.  Physical Exam: There were no vitals taken for this visit. GENERAL:  Well appearing WM in NAD. Walks with a cane HEENT:  PERRL, EOMI, sclera are clear. Oropharynx is clear. NECK:  No jugular venous distention, carotid upstroke brisk and symmetric, no bruits, no thyromegaly or adenopathy LUNGS:  Clear to auscultation bilaterally CHEST:  Unremarkable HEART:  IRRR,  PMI not displaced or sustained,S1 and S2 within normal limits, no S3, no S4: no clicks, no rubs, no murmurs ABD:  Soft, nontender. BS +, no masses or bruits. No hepatomegaly, no splenomegaly EXT:  2 + pulses throughout, no edema,  no cyanosis no clubbing SKIN:  Warm and dry.  No rashes NEURO:  Alert and oriented x 3. Cranial nerves II through XII intact. PSYCH:  Cognitively intact  LABORATORY DATA: Lab Results  Component Value Date   WBC 7.7 03/17/2022   HGB 15.9 03/17/2022   HCT 48.3 03/17/2022   PLT 177 03/17/2022   GLUCOSE 103 (H) 03/17/2022   CHOL 90 (L) 01/13/2017   TRIG 138 01/13/2017   HDL 29 (L) 01/13/2017   LDLCALC 33 01/13/2017   ALT 35 03/17/2022   AST 52 (H) 03/17/2022   NA 140 03/17/2022   K 4.3 03/17/2022   CL 105 03/17/2022   CREATININE 1.30 (H) 03/17/2022   BUN 22 03/17/2022   CO2 25 03/17/2022   TSH 2.723 12/17/2020   INR 1.1 11/14/2020   HGBA1C 5.6 04/25/2021   Labs dated 12/26/16: A1c 5.8%, Hgb 14.2, creatinine 1.21. TSH normal.  Dated 07/07/18: A1c 5.6%. creatinine 1.34. potassium 5.5. TSH normal. CBC normal. Dated 01/04/19: A1c 5.7% Dated 06/03/19: creatinine 1.32. CMET and CBC normal.  Dated 09/06/20: cholesterol 169, triglycerides 169, HDL 29, LDL 110, A1c 5.5%. creatinine 1.4. otherwise CMET normal. CBC normal. Dated 03/08/21: cholesterol 94, triglycerides 97, HDL 32, LDL 44. A1c 5.6%. creatinine 1.4. otherwise CBC, CMET and TSH normal. Dated 04/09/22: A1c 5.3%. creatinine 1.47. otherwise chemistries normal. Hgb 12.5.   Ecg not done today  Echo 07/09/18: IMPRESSIONS      1. The left ventricle has normal systolic function of 0000000. The cavity size was normal. There is no increased left ventricular wall thickness. Left ventricular diastology could not be evaluated secondary to atrial fibrillation.  2. The right ventricle has normal systolic function. The cavity was mildly enlarged. There is no increase in right ventricular wall thickness.  3. Left atrial size was moderately dilated.  4. The mitral valve is normal in structure.  5. The tricuspid valve is normal in structure.  6. The aortic valve is normal in structure. Aortic  valve regurgitation is mild by color flow Doppler.  7. The  pulmonic valve was normal in structure.  8. There is mild dilatation of the ascending aorta.   FINDINGS  Left Ventricle: The left ventricle has normal systolic function of 0000000. The cavity size was normal. There is no increased left ventricular wall thickness. Left ventricular diastology could not be evaluated secondary to atrial fibrillation. Right Ventricle: The right ventricle has normal systolic function. The cavity was mildly enlarged. There is no increase in right ventricular wall thickness. Left Atrium: left atrial size was moderately dilated Right Atrium: right atrial size was normal in size Interatrial Septum: No atrial level shunt detected by color flow Doppler.   Assessment / Plan: 1. Atrial fibrillation- persistent and now permanent.  He is completely asymptomatic. Rate controlled on low dose Coreg. Now on Eliquis for anticoagulation. [place] Vasc of 2.   2. Mixed hyperlipidemia. Now on Crestor only and tolerating well. Labs look good.   3. HTN- controlled on low dose Coreg  4. Normal cardiac cath 2009.  5. S/p right TKR  6. History of GI bleed secondary to duodenal ulcers.   I will follow up in one year

## 2022-07-15 ENCOUNTER — Encounter: Payer: Self-pay | Admitting: Cardiology

## 2022-07-15 ENCOUNTER — Ambulatory Visit: Payer: Medicare Other | Attending: Cardiology | Admitting: Cardiology

## 2022-07-15 VITALS — BP 110/62 | HR 91 | Ht 70.5 in | Wt 240.2 lb

## 2022-07-15 DIAGNOSIS — I4821 Permanent atrial fibrillation: Secondary | ICD-10-CM | POA: Diagnosis not present

## 2022-07-15 DIAGNOSIS — I10 Essential (primary) hypertension: Secondary | ICD-10-CM

## 2022-07-15 DIAGNOSIS — E782 Mixed hyperlipidemia: Secondary | ICD-10-CM | POA: Diagnosis not present

## 2022-07-15 MED ORDER — ELIQUIS 5 MG PO TABS: 5.0000 mg | ORAL_TABLET | Freq: Two times a day (BID) | ORAL | 3 refills | Status: DC

## 2022-07-15 NOTE — Patient Instructions (Signed)
Medication Instructions:  No changes *If you need a refill on your cardiac medications before your next appointment, please call your pharmacy*  Follow-Up: At [facility], you and your health needs are our priority.  As part of our continuing mission to provide you with exceptional heart care, we have created designated Provider Care Teams.  These Care Teams include your primary Cardiologist (physician) and Advanced Practice Providers (APPs -  Physician Assistants and Nurse Practitioners) who all work together to provide you with the care you need, when you need it.  We recommend signing up for the patient portal called "MyChart".  Sign up information is provided on this After Visit Summary.  MyChart is used to connect with patients for Virtual Visits (Telemedicine).  Patients are able to view lab/test results, encounter notes, upcoming appointments, etc.  Non-urgent messages can be sent to your provider as well.   To learn more about what you can do with MyChart, go to [email or web address].    Your next appointment:   1 year(s)  Provider:   Peter [place], MD

## 2022-08-21 ENCOUNTER — Other Ambulatory Visit: Payer: Self-pay | Admitting: Internal Medicine

## 2022-08-23 DIAGNOSIS — D225 Melanocytic nevi of trunk: Secondary | ICD-10-CM | POA: Diagnosis not present

## 2022-08-23 DIAGNOSIS — R202 Paresthesia of skin: Secondary | ICD-10-CM | POA: Diagnosis not present

## 2022-08-23 DIAGNOSIS — L2089 Other atopic dermatitis: Secondary | ICD-10-CM | POA: Diagnosis not present

## 2022-08-23 DIAGNOSIS — L821 Other seborrheic keratosis: Secondary | ICD-10-CM | POA: Diagnosis not present

## 2022-08-23 DIAGNOSIS — L814 Other melanin hyperpigmentation: Secondary | ICD-10-CM | POA: Diagnosis not present

## 2022-09-02 ENCOUNTER — Other Ambulatory Visit: Payer: Self-pay | Admitting: Neurology

## 2022-09-02 NOTE — Telephone Encounter (Signed)
Last seen 03/12/22 by Olegario Messier NP, upcoming appt w/ her again on 09/18/22. Pt requesting lyrica refills. Routing to provider to refill.  Prior Dispenses:   Dispensed Days Supply Quantity Provider Pharmacy  pregabalin 100 mg capsule 08/13/2022 30 120 each Marcial Pacas, MD [facility]  pregabalin 100 mg capsule 07/15/2022 30 120 each Marcial Pacas, MD [facility]  pregabalin 100 mg capsule 06/17/2022 30 120 each Marcial Pacas, MD [facility]  pregabalin 100 mg capsule 05/16/2022 30 120 each Marcial Pacas, MD [facility]  pregabalin 100 mg capsule 04/17/2022 30 120 each Marcial Pacas, MD [facility]  pregabalin 100 mg capsule 03/18/2022 30 120 each Marcial Pacas, MD [facility]  pregabalin 100 mg capsule 02/14/2022 30 120 each Suzzanne Cloud, NP [facility]  pregabalin 100 mg capsule 01/17/2022 30 120 each Suzzanne Cloud, NP [facility]  pregabalin 100 mg capsule 12/19/2021 30 120 each Suzzanne Cloud, NP [facility]  pregabalin 100 mg capsule 11/01/2021 30 120 each Marcial Pacas, MD [facility]Marland KitchenMarland Kitchen

## 2022-09-18 ENCOUNTER — Ambulatory Visit: Payer: Medicare Other | Admitting: Neurology

## 2022-09-18 ENCOUNTER — Encounter: Payer: Self-pay | Admitting: Neurology

## 2022-09-18 VITALS — BP 142/94 | HR 68 | Ht 70.0 in | Wt 241.5 lb

## 2022-09-18 DIAGNOSIS — G6289 Other specified polyneuropathies: Secondary | ICD-10-CM

## 2022-09-18 DIAGNOSIS — G2581 Restless legs syndrome: Secondary | ICD-10-CM

## 2022-09-18 DIAGNOSIS — R269 Unspecified abnormalities of gait and mobility: Secondary | ICD-10-CM | POA: Diagnosis not present

## 2022-09-18 MED ORDER — ROPINIROLE HCL 0.5 MG PO TABS: ORAL_TABLET | ORAL | 1 refills | Status: DC

## 2022-09-18 MED ORDER — DULOXETINE HCL 60 MG PO CPEP: 60.0000 mg | ORAL_CAPSULE | Freq: Every day | ORAL | 4 refills | Status: DC

## 2022-09-18 NOTE — Progress Notes (Signed)
Patient: [name] Date of Birth: Apr 02, 1941  Reason for Visit: Follow up History from: Patient Primary Neurologist: [name]  ASSESSMENT AND PLAN 82 y.o. year old male   1.  Peripheral neuropathy 2.  Restless leg syndrome 3.  Anemia-on iron supplement  -Increase in RLS symptoms, increase Requip, add 0.5 mg at 6 PM, continue 0.5 mg at bedtime -Continue Cymbalta 60 mg daily, we discussed moving this dose to the evening, for now he wishes to continue dosing in the morning -Continue Lyrica 100 mg, 4 capsules at bedtime, we discussed this is high dose, not much room for further increase -On Klonopin from PCP as needed for RLS symptoms, also on iron supplement -Follow-up in 6 months or sooner if needed  Meds ordered this encounter  Medications   rOPINIRole (REQUIP) 0.5 MG tablet    Sig: Take 1 tablet at 6 PM, take 1 tablet at bedtime    Dispense:  180 tablet    Refill:  1   DULoxetine (CYMBALTA) 60 MG capsule    Sig: Take 1 capsule (60 mg total) by mouth daily.    Dispense:  90 capsule    Refill:  4   HISTORY  [name] is a 82 yo RH male, accompanied by his wife, referred by Dr. Modesto Hawkins, and his primary care physician Dr. Uvaldo Hawkins for evaluation of chronic low back pain, bilateral lower extremity muscle ache  He had a past medical history of hypertension, hyperlipidemia, presenting with chronic low back pain, bilateral lower extremity muscle achy pain since 2010.   Initially it was thought due to his statin treatment, he has tried different statin without improving his symptoms,over the years, he also received multiple lumbar bilateral facet joint fluoroscopy guided injection without improving his symptoms.   In 2012, he was diagnosed with L4 and 5 lumbar stenosis, had lumbar laminectomy L3-4, L4-L5 by Dr. Jeral Hawkins in July 2012,without improving his symptoms,  He was seen by different specialists, tried different medications, this including Elavil, Xanax as needed, he is currently  taking hydrocodone 5/325 mg 1-2 tablets every night, Xanax 0.25 milligram as needed, Flexeril 10 mg every night, gabapentin 600 mg every night without significant improvement,  During the daytime, he denies significant low back pain, or bilateral lower extremity pain, he does has history of right ankle fracture, mild gait difficulty due to right ankle pain, but starting at evening time, he began to noticed deep achy pain starting from midline low back, radiating along bilateral lateral leg, constant, he tends to pace around, has difficulty sleeping at nighttime, sometimes he has to got up take a hot bath couple times every night, put ice on his back, on his feet, only provide temporary relief.  He has also tried acupuncture, massage, TENS unit, without significant improvement, previously, he has a short trial of low-dose Requip, without improving his symptoms, no significant side effect noticed either.  He has bilateral lateral 3 toes numbness, no weakness, no bilateral fingertips numbness, or weakness  Per record, electrodiagnostic study failed to demonstrate significant etiology,  We have reviewed MRI lumbar in 2012, there was evidence of L4-5 spinal stenosis, and the most recent MRI  Lumbar was  in June 2014,progression of multilevel facet arthropathy, status post a laminectomy at L3-4, with residual large synovial cyst extending along the posterior epidural space without significant central canal stenosis, mild foraminal narrowing bilaterally at L3 and 4, progression of mild lateral recess narrowing bilaterally at L4-5, foraminal narrowing is stable at  this level  MRI of the brain in June 2014 showed mild atrophy, no acute intracranial abnormality   UPDATE Dec 15th 2015:   He is tolerating gabapentin 300 mg 2 tablets every night, Requip 1 mg 2 tablets every night, he can sleep better with the medications, but continue complains of bilateral lower extremity dull achy pain, numbness, urgency to move,  especially at nighttime, He also complains of slow progressive gait difficulty, grasps things for support, he has urinary urgency, but no incontinence.   UPDATE Jan 29th 2016: He is now taking gabapentin 300mg   2 tabs qhs and requip 1mg , 3 tabs qhs, which has been very helpful,  30%, improvement, he still has bilatarel leg pain at evening, stretching,massage,  He denies bowel and bladder incontinence , he has tinlging at the soles of his feet.    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   He continue complaining significant difficulty at nighttime, came in with a full page list of symptoms, in the morning time, he denies significant back pain, no low back pain, symptoms started in the late afternoon, gradually building up, around 9: to10 PM, he noticed achy feeling down lateral side of both leg, increased by sitting still, intense urge to lie on the floor, stretching muscle backwards, late night hot soaking bath to help him sleep, he has to take about 3 AM last night, difficulty sleeping, excessive fatigue during the daytime, could not sit through movies, standing up in the aisle 2/3 of the movie He is now taking, hydrocodone  5/325mg  2 tabs 9pm, advil 200mg  2 tab    He has quit taking Requip 1 mg, up to 4 tablets every night, also gabapentin, complains of upset stomach, without relieving his symptoms, he is now taking Advil 200 mg 2 tablets as needed, hydrocodone/Tylenol 5/325 mg 2 tablets every night for symptoms control, which only mild, temporarily, Tylenol helps some too.   UPDATE April 29th 2016: Laboratory showed low iron, ferritin level at 9, he was put on iron supplement, his restless  leg symptoms has much improved, He is now taking clonazepam 0.5 milligram every night, Lyrica 50 mg every night, he can sleep much better,   He continue, combat right ankle pain   UPDATE August 2nd 2016: He sleeps well, he is now taking clonazepam 0.5mg  qhs, Lyrica 50mg  3 tabs qhs, no longer on  Hydrocodone, he complains of side effect   UPDATE August 06 2016: I reviewed operation record on April 18 2016, he had right lumbar L3-4, L4-5 anterolateral lumbar interbody fusion, percutaneous pedicle screw on April 18 2016, which has helped his left-sided low back pain radiating pain to left lower extremity, 4. Of time he was almost pain-free, unfortunately he fell landed on his right side, is then he began to experience excruciating radiating pain from left side to left anterior thigh.   We have personally reviewed and compared to MRI lumbar in October 2017, and in February 2018. Which showed pedicle screw and interbody fusion at L3-4, L4-5 without stenosis, small fluid collection posterior to the thecal sac at L3-4, moderate right foraminal narrowing, mild foraminal narrowing at L4-5.   He is planning on to have second left-sided lumbar  decompression surgery on August 15 2016   His restless leg symptoms is under good control with current dose of Lyrica 100 mg twice a day, clonazepam as needed, oxycodone 5 mg every night   His most recent ferritin level was 47, hemoglobin was 11 point 5,   UPDATE August 07 2017: He is accompanied by his wife at today's clinical visit, complaining worsening restless leg symptoms, for a while, his restless leg symptoms seems to be under okay control.   Since 2017, he had multiple surgery, lumbar fusion by Dr. Jennye Moccasin November 2017, redo in March 2018, left hip replacement in November 2018,   He has baseline gait abnormality, but no longer have significant pain, at nighttime, 1-2 hours after lying down, he has uncontrollable right leg muscle jerking, urged to  move,   He is now taking Lyrica 100 mg 2 tablets at nighttime, clonazepam 0.5 mg every night, often have to take 1-1/2 tablets of Percocet 5/325 mg early morning time if he could not sleep his combination medication treatment.  Sometimes he has to take hot bath in the middle of the night to help him go to sleep.   UPDATE Sept 9 2019: Now he is taking generic Lyric  3 tabs qhs, Requip 0.5 mg every night,  20% of time he is able to sleep without any difficulties, but 80% of the time about 1 to 2 hours after he goes to bed, he has severe leg discomfort, has to take a hot bath, then was able to sleep the rest of the night, sometimes he took clonazepam 0.5 mg half to 1 tablets before bedtime, complains of excessive drowsiness even to the point of wetting his bed,     His ferritin level is low 15 ng/ml March 2019, last colonoscopy was in 2017, hemoglobin in November 2018 prior to his left hip surgery was 13, he is now on iron supplement   UPDATE August 10 2018: Ferritin in Sept 2019 was 49.  He continues to have restless leg symptoms, halftime in a week, he has trouble sleeping due to restless leg symptoms, to get up to take a hot shower, which usually is very helpful, is taking Lyrica 100 in the morning, 200 mg at 2 hours prior to sleep, also take iron supplement,   UPDATE August 11 2019: He has lost few dear family member over short period of time, is feeling sad.  Around February 2021, he felt like his restless leg symptoms is under good control, decided to taper down Lyrica doses, instead of 100 mg 3 tablets every night, he tapered down to 100 mg every night, gradually increased difficulty sleeping, difficult to find a comfortable position for his leg, take hot bath, clonazepam 0.5 mg as needed was helpful last night, he also takes Cymbalta 60 mg daily, Requip 0.5 mg at bedtime   Laboratory evaluations in December 2020, normal CBC hemoglobin 14.3, CMP, creatinine of 1.2, ferritin was 15 August 2017,  after iron supplement, improved to 49   CT abdomen in December 2020 showed no acute abnormality, large paraesophageal hiatal hernia, with approximately one half of the stomach present in the chest, diffuse colonic diverticulosis, without evidence of acute diverticulitis, mild diffuse hepatic steatosis, marked prostate gland enlargement, with enhancing nodule arising from the superior portion of the median lobe of the prostate gland.   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 based on symptoms alone without looking at his skin lesion,   He denies a previous history of shingles, had a Shingrix vaccination end of 2020, did reported history of left upper thoracic rash, itching few months ago   His restless leg symptoms is under good control with current regimen, Lyrica 100 mg 3 tablets every night, Requip 0.5 mg every night, Cymbalta 60 mg every morning, clonazepam 0.5 mg as needed   He has history of chronic low back pain, more to left side, had a history of lumbar decompression surgery in the past, left hip replacement in 2018, did have left hip injury within a week of replacement surgery, pain has gradually improved, also had a history of right ankle surgery, had gait abnormality.   UPDATE Sept 12 2022: He lost his wife in March 2022, going through grieving, had right knee replacement in June 2021, during the rehab, he was found to have dark stool, epigastric discomfort, using NSAIDs for right knee pain, also taking Eliquis for atrial fibrillation  Reviewed hospital record CT showed  duodenitis, had active GI bleeding due to duodenal ulcer, hemoglobin of 8.9 required blood transfusion,   He is planning on moving to independent living, current medicine works well for his restless leg   He also complains of worsening bilateral feet numbness tingling   Update April 25, 2021: His restless leg symptoms overall is under good control with his current medications, Lyrica 100 mg 3 tablets every night, clonazepam as needed, he rarely uses it, Cymbalta 60 mg every day,  He return for electrodiagnostic study today, which did show evidence of moderate axonal peripheral neuropathy, in addition there is evidence of chronic bilateral lumbosacral radiculopathy.  Reviewed laboratory evaluation in July 2022, when he was admitted to the hospital for GI bleeding, hemoglobin was 8.9, normal TSH, BMP, with creatinine of 1.27 calcium was 8.2, GFR of 56   Update December 18, 2021 SS: Labs in November 2022 A1c 5.6, MM panel negative, ANA negative, ferritin 54. Reports more tingling up to almost knee level.  Uses cane, has bad right knee, left hip. At times feels at risk for falling, variety of factors. Living at [facility]. Taking 400 mg Lyrica at night, Klonopin 0.5 mg PRN at bedtime only once a month, Cymbalta 60 mg daily, Requip 0.5 mg at bedtime. No more RLS symptoms. Takes iron tablet. Had virtual visit with PCP today for depression, no medications added.    Update March 12, 2022 SS: VV today, has been taking Lyrica 100 mg AM, 400 mg PM, this was helpful for neuropathy symptoms, he misunderstood instructions to spread out dosing. Still some tingling in legs, gait instability (this is not worse). Is sleeping well, still on Requip 0.5 mg at bedtime, Cymbalta 60 mg daily. Not using Klonopin at all. Is now sleeping great, he has a lady friend. Living at [facility] apartment for 5 months now. BP has been running 130/80's. Is still taking Iron, PCP has been following. No  falls.  Update September 18, 2022 SS: Can feel tingling up to knees, on Cymbalta 60 mg daily, Lyrica 400-500 mg at bedtime, Requip 0.5 mg at 10 PM, Klonopin PRN from PCP. Still taking iron tablet. Itching  to left thigh, left hip replacement 2019, immediately after, felt strong pain to left groin, x-ray was okay. Since then left leg has shortened, getting heel lifts. Considering motor wheelchair, with walking gets pain to right hip. Is in independent living.  Walking with cane. At night tingling to his legs, his legs are restless. Itching to left lateral leg.   REVIEW OF SYSTEMS: Out of a complete 14 system review of symptoms, the patient complains only of the following symptoms, and all other reviewed systems are negative.  See HPI  ALLERGIES: Allergies  Allergen Reactions   Compazine [Prochlorperazine Edisylate] Other (See Comments)    EXTRAPYRAMIDAL MOVEMENT [Involuntary muscle movement]   Penicillins Nausea And Vomiting, Rash and Other (See Comments)    Has patient had a PCN reaction causing immediate rash, facial/tongue/throat swelling, SOB or lightheadedness with hypotension: #  #  #  YES  #  #  #  Has patient had a PCN reaction causing severe rash involving mucus membranes or skin necrosis: No Has patient had a PCN reaction that required hospitalization No Has patient had a PCN reaction occurring within the last 10 years: No If all of the above answers are "NO", then may proceed with Cephalosporin use.    Welchol [Colesevelam Hcl] Other (See Comments)    MYALGIAS [Leg aches]   Gemfibrozil Nausea Only    PATIENT TOLERATES   Red Yeast Rice [Cholestin] Other (See Comments)    flushing    HOME MEDICATIONS: Outpatient Medications Prior to Visit  Medication Sig Dispense Refill   acetaminophen (TYLENOL) 325 MG tablet Take 2 tablets (650 mg total) by mouth every 6 (six) hours as needed for mild pain (or Fever >/= 101).     carvedilol (COREG) 3.125 MG tablet Take 3.125 mg by mouth at  bedtime.      clonazePAM (KLONOPIN) 0.5 MG tablet take 1/2-1 TABLET BY MOUTH EVERYDAY AT BEDTIME AS NEEDED 20 tablet 0   ELIQUIS 5 MG TABS tablet Take 1 tablet (5 mg total) by mouth 2 (two) times daily. 180 tablet 3   ferrous sulfate 324 (65 Fe) MG TBEC Take 324 mg by mouth daily.     Multiple Vitamins-Minerals (MULTIVITAMIN PO) Take 1 tablet by mouth daily.     nitroGLYCERIN (NITROSTAT) 0.4 MG SL tablet Place 0.4 mg under the tongue every 5 (five) minutes as needed for chest pain.     pantoprazole (PROTONIX) 40 MG tablet Take 1 tablet (40 mg total) by mouth 2 (two) times daily before a meal. (Patient taking differently: Take 40 mg by mouth 2 (two) times daily before a meal. Take 1 tablet by mouth twice a day 4d/week alternating with 1 tablet once a day 3d/week) 60 tablet 1   polyethylene glycol (MIRALAX / GLYCOLAX) packet Take 17 g by mouth daily as needed for mild constipation.     pregabalin (LYRICA) 100 MG capsule TAKE FOUR CAPSULES BY MOUTH AT bedtime 120 capsule 5   rosuvastatin (CRESTOR) 10 MG tablet TAKE 1 TABLET ONCE DAILY OR AS DIRECTED. 90 tablet 2   tamsulosin (FLOMAX) 0.4 MG CAPS capsule Take 0.4 mg by mouth daily.     traMADol (ULTRAM) 50 MG tablet Take 1 tablet (50 mg total) by mouth every 6 (six) hours as needed for moderate pain. 20 tablet 0   DULoxetine (CYMBALTA) 60 MG capsule Take 1 capsule (60 mg total) by mouth daily. 90 capsule 4   rOPINIRole (REQUIP) 0.5 MG tablet Take 1 tablet (0.5 mg total) by mouth  every evening. 90 tablet 3   No facility-administered medications prior to visit.    PAST MEDICAL HISTORY: Past Medical History:  Diagnosis Date   Anginal pain    NONE IN 3 YEARS   Arthritis    Atrial fibrillation 1962   a. No recurrence since 1962.   Chest pain    a. 1998 Cath: nl cors;  b. 2009 Cath: nl cors.   Depression    Dysrhythmia    NO TROUBLE IN 1 YR    DR. PETER [place]    Esophageal reflux    Essential hypertension    History of Paroxysmal atrial  flutter (HCC)    Hyperlipidemia    Incontinence of urine    Leg pain, bilateral    Nocturia    PONV (postoperative nausea and vomiting)    CONSTIPATED   Scarlet fever 1945   Spinal stenosis    Staph skin infection    LEFT GROIN  04/11/16  TX W/ DOXYCYCLINE   Ulcerative proctitis     PAST SURGICAL HISTORY: Past Surgical History:  Procedure Laterality Date   ANKLE RECONSTRUCTION Right 2007   [place]   ANTERIOR LAT LUMBAR FUSION Right 04/18/2016   Procedure: RIGHT LUMBAR THREE-FOUR, LUMBAR FOUR-FIVE ANTEROLATERAL LUMBAR INTERBODY FUSION;  Surgeon: Maeola Harman, MD;  Location: [facility] OR;  Service: Neurosurgery;  Laterality: Right;  RIGHT L3-4 L4-5 ANTEROLATERAL LUMBAR INTERBODY FUSION   BIOPSY  12/16/2020   Procedure: BIOPSY;  Surgeon: Kerin Salen, MD;  Location: [facility];  Service: Gastroenterology;;   CARDIAC CATHETERIZATION  03/03/2008   Left heart cardiac catheterization and coronary   CARDIAC CATHETERIZATION  1997   Dr Donnie Aho   CATARACT EXTRACTION W/ INTRAOCULAR LENS  IMPLANT, BILATERAL     2016   ESOPHAGEAL DILATION     2008   ESOPHAGOGASTRODUODENOSCOPY N/A 12/16/2020   Procedure: ESOPHAGOGASTRODUODENOSCOPY (EGD);  Surgeon: Kerin Salen, MD;  Location: [facility];  Service: Gastroenterology;  Laterality: N/A;   INNER EAR SURGERY     EAR INJECTION FOR MENIERES   KNEE ARTHROSCOPY  1991, 1995   LUMBAR LAMINECTOMY  12/2010   LUMBAR LAMINECTOMY/DECOMPRESSION MICRODISCECTOMY Left 08/15/2016   Procedure: Left Left three- four Redo laminectomy;  Surgeon: Maeola Harman, MD;  Location[facility];  Service: Neurosurgery;  Laterality: Left;  Left L3-4 Redo laminectomy   LUMBAR PERCUTANEOUS PEDICLE SCREW 2 LEVEL N/A 04/18/2016   Procedure: LUMBAR THREE-FOUR, LUMBAR FOUR-FIVE PERCUTANEOUS PEDICLE SCREW;  Surgeon: Maeola Harman, MD;  Location: [facility] OR;  Service: Neurosurgery;  Laterality: N/A;   TOTAL HIP ARTHROPLASTY Left 04/22/2017   TOTAL HIP ARTHROPLASTY Left 04/22/2017   Procedure: TOTAL HIP  ARTHROPLASTY ANTERIOR APPROACH;  Surgeon: Marcene Corning, MD;  Location: [facility] OR;  Service: Orthopedics;  Laterality: Left;   TOTAL KNEE ARTHROPLASTY Right 11/21/2020   Procedure: RIGHT TOTAL KNEE ARTHROPLASTY;  Surgeon: Marcene Corning, MD;  Location: [facility];  Service: Orthopedics;  Laterality: Right;    FAMILY HISTORY: Family History  Problem Relation Age of Onset   Prostate cancer Father    Heart attack Brother    Kidney cancer Brother    Brain cancer Brother     SOCIAL HISTORY: Social History   Socioeconomic History   Marital status: Widowed    Spouse name: Darel Hong   Number of children: 3   Years of education: college   Highest education level: Not on file  Occupational History    Comment: Retired   Tobacco Use   Smoking status: Some Days    Types: Pipe  Smokeless tobacco: Never  Vaping Use   Vaping Use: Never used  Substance and Sexual Activity   Alcohol use: Yes    Alcohol/week: 1.0 standard drink of alcohol    Types: 1 Shots of liquor per week    Comment: occasionally   Drug use: No   Sexual activity: Not on file  Other Topics Concern   Not on file  Social History Narrative   Patient is retired and lives at home with his wife Darel Hong.    Education college.   Caffeine one cup daily.   Social Determinants of Health   Financial Resource Strain: Not on file  Food Insecurity: Not on file  Transportation Needs: Not on file  Physical Activity: Not on file  Stress: Not on file  Social Connections: Not on file  Intimate Partner Violence: Not on file    PHYSICAL EXAM  Vitals:   09/18/22 1254  BP: (!) 142/94  Pulse: 68  Weight: 241 lb 8 oz (109.5 kg)  Height: 5\' 10"  (1.778 m)   Body mass index is 34.65 kg/m.  Generalized: Well developed, in no acute distress  Neurological examination  Mentation: Alert oriented to time, place, history taking. Follows all commands speech and language fluent Cranial nerve II-XII: Pupils were equal round reactive to light.  Extraocular movements were full, visual field were full on confrontational test. Facial sensation and strength were normal.  Head turning and shoulder shrug  were normal and symmetric. Motor: 5/5 bilateral upper extremities, 5/5 RLE, 4/5 left hip flexion  Sensory: Sensory testing is intact to soft touch on all 4 extremities. No evidence of extinction is noted.  Coordination: Cerebellar testing reveals good finger-nose-finger and heel-to-shin bilaterally.  Gait and station: Gait is slightly wide-based, cautious, can walk short distances independently Reflexes: Deep tendon reflexes are symmetric but decreased  DIAGNOSTIC DATA (LABS, IMAGING, TESTING) - I reviewed patient records, labs, notes, testing and imaging myself where available.  Lab Results  Component Value Date   WBC 7.7 03/17/2022   HGB 15.9 03/17/2022   HCT 48.3 03/17/2022   MCV 98.6 03/17/2022   PLT 177 03/17/2022      Component Value Date/Time   NA 140 03/17/2022 1243   NA 138 04/29/2016 0000   K 4.3 03/17/2022 1243   CL 105 03/17/2022 1243   CO2 25 03/17/2022 1243   GLUCOSE 103 (H) 03/17/2022 1243   BUN 22 03/17/2022 1243   BUN 19 04/29/2016 0000   CREATININE 1.30 (H) 03/17/2022 1243   CALCIUM 9.3 03/17/2022 1243   PROT 7.4 03/17/2022 1243   PROT 6.4 04/25/2021 0837   ALBUMIN 4.5 03/17/2022 1243   ALBUMIN 4.4 01/13/2017 0846   AST 52 (H) 03/17/2022 1243   ALT 35 03/17/2022 1243   ALKPHOS 53 03/17/2022 1243   BILITOT 1.2 03/17/2022 1243   BILITOT 0.4 01/13/2017 0846   GFRNONAA 55 (L) 03/17/2022 1243   GFRAA 59 (L) 04/14/2017 1000   Lab Results  Component Value Date   CHOL 90 (L) 01/13/2017   HDL 29 (L) 01/13/2017   LDLCALC 33 01/13/2017   TRIG 138 01/13/2017   CHOLHDL 3.8 01/18/2016   Lab Results  Component Value Date   HGBA1C 5.6 04/25/2021   Lab Results  Component Value Date   VITAMINB12 627 12/17/2020   Lab Results  Component Value Date   TSH 2.723 12/17/2020    Margie Ege, AGNP-C, DNP  09/18/2022, 1:38 PM [facility] [address] [place], [place] [address] [phone]  [phone]   

## 2022-09-18 NOTE — Patient Instructions (Signed)
Increase Requip to 1 tablet at 6 PM, 1 tablet at bedtime Keep other medications

## 2022-09-23 DIAGNOSIS — I4819 Other persistent atrial fibrillation: Secondary | ICD-10-CM | POA: Diagnosis not present

## 2022-09-23 DIAGNOSIS — N1831 Chronic kidney disease, stage 3a: Secondary | ICD-10-CM | POA: Diagnosis not present

## 2022-09-23 DIAGNOSIS — E782 Mixed hyperlipidemia: Secondary | ICD-10-CM | POA: Diagnosis not present

## 2022-09-23 DIAGNOSIS — K219 Gastro-esophageal reflux disease without esophagitis: Secondary | ICD-10-CM | POA: Diagnosis not present

## 2022-09-23 DIAGNOSIS — I10 Essential (primary) hypertension: Secondary | ICD-10-CM | POA: Diagnosis not present

## 2022-09-26 ENCOUNTER — Telehealth: Payer: Self-pay | Admitting: Neurology

## 2022-09-26 NOTE — Telephone Encounter (Signed)
Pt called stating that he is needing a refill on his pregabalin (LYRICA) 100 MG capsule and states that he keeps getting put on long holds at [facility] and they were supposed to deliver this medication yesterday and he has not received it and is out of it. Pt stated that he is not feeling to good since he has not been able to take the medication. Please advise.

## 2022-09-26 NOTE — Telephone Encounter (Signed)
[facility] called [facility] w/pt online as well the pharmacy stated that they rescheduled it for today pt voiced understanding to expect it today.

## 2022-09-26 NOTE — Telephone Encounter (Signed)
Pt called back. Said to tell Cinda Quest thank you very much.

## 2022-10-11 DIAGNOSIS — R809 Proteinuria, unspecified: Secondary | ICD-10-CM | POA: Diagnosis not present

## 2022-10-11 DIAGNOSIS — E782 Mixed hyperlipidemia: Secondary | ICD-10-CM | POA: Diagnosis not present

## 2022-10-11 DIAGNOSIS — R7309 Other abnormal glucose: Secondary | ICD-10-CM | POA: Diagnosis not present

## 2022-10-11 DIAGNOSIS — Z79899 Other long term (current) drug therapy: Secondary | ICD-10-CM | POA: Diagnosis not present

## 2022-10-11 DIAGNOSIS — R7301 Impaired fasting glucose: Secondary | ICD-10-CM | POA: Diagnosis not present

## 2022-10-14 ENCOUNTER — Emergency Department (HOSPITAL_COMMUNITY): Payer: Medicare Other

## 2022-10-14 ENCOUNTER — Other Ambulatory Visit: Payer: Self-pay

## 2022-10-14 ENCOUNTER — Inpatient Hospital Stay (HOSPITAL_COMMUNITY)
Admission: EM | Admit: 2022-10-14 | Discharge: 2022-10-17 | DRG: 392 | Disposition: A | Discharge status: Home / Self Care | Payer: Medicare Other | Attending: Internal Medicine | Admitting: Internal Medicine

## 2022-10-14 ENCOUNTER — Encounter (HOSPITAL_COMMUNITY): Payer: Self-pay | Admitting: Emergency Medicine

## 2022-10-14 DIAGNOSIS — G2581 Restless legs syndrome: Secondary | ICD-10-CM | POA: Diagnosis not present

## 2022-10-14 DIAGNOSIS — Z7901 Long term (current) use of anticoagulants: Secondary | ICD-10-CM

## 2022-10-14 DIAGNOSIS — D649 Anemia, unspecified: Secondary | ICD-10-CM | POA: Diagnosis not present

## 2022-10-14 DIAGNOSIS — K3189 Other diseases of stomach and duodenum: Principal | ICD-10-CM | POA: Diagnosis present

## 2022-10-14 DIAGNOSIS — F17200 Nicotine dependence, unspecified, uncomplicated: Secondary | ICD-10-CM | POA: Diagnosis not present

## 2022-10-14 DIAGNOSIS — Z96651 Presence of right artificial knee joint: Secondary | ICD-10-CM | POA: Diagnosis not present

## 2022-10-14 DIAGNOSIS — Z6834 Body mass index (BMI) 34.0-34.9, adult: Secondary | ICD-10-CM

## 2022-10-14 DIAGNOSIS — Z79899 Other long term (current) drug therapy: Secondary | ICD-10-CM

## 2022-10-14 DIAGNOSIS — K440 Diaphragmatic hernia with obstruction, without gangrene: Secondary | ICD-10-CM | POA: Diagnosis not present

## 2022-10-14 DIAGNOSIS — Z888 Allergy status to other drugs, medicaments and biological substances status: Secondary | ICD-10-CM

## 2022-10-14 DIAGNOSIS — F32A Depression, unspecified: Secondary | ICD-10-CM | POA: Diagnosis present

## 2022-10-14 DIAGNOSIS — K5730 Diverticulosis of large intestine without perforation or abscess without bleeding: Secondary | ICD-10-CM | POA: Diagnosis not present

## 2022-10-14 DIAGNOSIS — I129 Hypertensive chronic kidney disease with stage 1 through stage 4 chronic kidney disease, or unspecified chronic kidney disease: Secondary | ICD-10-CM | POA: Diagnosis not present

## 2022-10-14 DIAGNOSIS — N400 Enlarged prostate without lower urinary tract symptoms: Secondary | ICD-10-CM | POA: Diagnosis present

## 2022-10-14 DIAGNOSIS — Y848 Other medical procedures as the cause of abnormal reaction of the patient, or of later complication, without mention of misadventure at the time of the procedure: Secondary | ICD-10-CM | POA: Diagnosis present

## 2022-10-14 DIAGNOSIS — E669 Obesity, unspecified: Secondary | ICD-10-CM | POA: Diagnosis present

## 2022-10-14 DIAGNOSIS — R0689 Other abnormalities of breathing: Secondary | ICD-10-CM | POA: Diagnosis not present

## 2022-10-14 DIAGNOSIS — K449 Diaphragmatic hernia without obstruction or gangrene: Secondary | ICD-10-CM | POA: Diagnosis not present

## 2022-10-14 DIAGNOSIS — Z880 Allergy status to penicillin: Secondary | ICD-10-CM | POA: Diagnosis not present

## 2022-10-14 DIAGNOSIS — D7589 Other specified diseases of blood and blood-forming organs: Secondary | ICD-10-CM | POA: Diagnosis not present

## 2022-10-14 DIAGNOSIS — I4821 Permanent atrial fibrillation: Secondary | ICD-10-CM | POA: Diagnosis not present

## 2022-10-14 DIAGNOSIS — R109 Unspecified abdominal pain: Secondary | ICD-10-CM | POA: Diagnosis not present

## 2022-10-14 DIAGNOSIS — Z8711 Personal history of peptic ulcer disease: Secondary | ICD-10-CM

## 2022-10-14 DIAGNOSIS — I4892 Unspecified atrial flutter: Secondary | ICD-10-CM | POA: Diagnosis present

## 2022-10-14 DIAGNOSIS — Z8042 Family history of malignant neoplasm of prostate: Secondary | ICD-10-CM

## 2022-10-14 DIAGNOSIS — Z981 Arthrodesis status: Secondary | ICD-10-CM

## 2022-10-14 DIAGNOSIS — Z8249 Family history of ischemic heart disease and other diseases of the circulatory system: Secondary | ICD-10-CM

## 2022-10-14 DIAGNOSIS — E785 Hyperlipidemia, unspecified: Secondary | ICD-10-CM | POA: Diagnosis not present

## 2022-10-14 DIAGNOSIS — N1831 Chronic kidney disease, stage 3a: Secondary | ICD-10-CM | POA: Diagnosis present

## 2022-10-14 DIAGNOSIS — Z743 Need for continuous supervision: Secondary | ICD-10-CM | POA: Diagnosis not present

## 2022-10-14 DIAGNOSIS — Z808 Family history of malignant neoplasm of other organs or systems: Secondary | ICD-10-CM

## 2022-10-14 DIAGNOSIS — R6889 Other general symptoms and signs: Secondary | ICD-10-CM | POA: Diagnosis not present

## 2022-10-14 DIAGNOSIS — I4891 Unspecified atrial fibrillation: Secondary | ICD-10-CM | POA: Diagnosis not present

## 2022-10-14 DIAGNOSIS — S27818A Other injury of esophagus (thoracic part), initial encounter: Secondary | ICD-10-CM | POA: Diagnosis not present

## 2022-10-14 DIAGNOSIS — F17290 Nicotine dependence, other tobacco product, uncomplicated: Secondary | ICD-10-CM | POA: Diagnosis present

## 2022-10-14 DIAGNOSIS — R079 Chest pain, unspecified: Secondary | ICD-10-CM | POA: Diagnosis not present

## 2022-10-14 DIAGNOSIS — G629 Polyneuropathy, unspecified: Secondary | ICD-10-CM | POA: Diagnosis present

## 2022-10-14 DIAGNOSIS — K219 Gastro-esophageal reflux disease without esophagitis: Secondary | ICD-10-CM | POA: Diagnosis not present

## 2022-10-14 DIAGNOSIS — N281 Cyst of kidney, acquired: Secondary | ICD-10-CM | POA: Diagnosis not present

## 2022-10-14 DIAGNOSIS — Z96642 Presence of left artificial hip joint: Secondary | ICD-10-CM | POA: Diagnosis not present

## 2022-10-14 DIAGNOSIS — F17210 Nicotine dependence, cigarettes, uncomplicated: Secondary | ICD-10-CM | POA: Diagnosis not present

## 2022-10-14 DIAGNOSIS — I499 Cardiac arrhythmia, unspecified: Secondary | ICD-10-CM | POA: Diagnosis not present

## 2022-10-14 DIAGNOSIS — Z452 Encounter for adjustment and management of vascular access device: Secondary | ICD-10-CM | POA: Diagnosis not present

## 2022-10-14 DIAGNOSIS — I482 Chronic atrial fibrillation, unspecified: Secondary | ICD-10-CM | POA: Diagnosis not present

## 2022-10-14 DIAGNOSIS — I700 Atherosclerosis of aorta: Secondary | ICD-10-CM | POA: Diagnosis not present

## 2022-10-14 DIAGNOSIS — I10 Essential (primary) hypertension: Secondary | ICD-10-CM | POA: Diagnosis present

## 2022-10-14 DIAGNOSIS — Z8051 Family history of malignant neoplasm of kidney: Secondary | ICD-10-CM

## 2022-10-14 DIAGNOSIS — I4819 Other persistent atrial fibrillation: Secondary | ICD-10-CM | POA: Diagnosis present

## 2022-10-14 DIAGNOSIS — R935 Abnormal findings on diagnostic imaging of other abdominal regions, including retroperitoneum: Secondary | ICD-10-CM | POA: Diagnosis not present

## 2022-10-14 DIAGNOSIS — K562 Volvulus: Secondary | ICD-10-CM | POA: Diagnosis not present

## 2022-10-14 LAB — CBC
HCT: 42.3 % (ref 39.0–52.0)
Hemoglobin: 13.6 g/dL (ref 13.0–17.0)
MCH: 33 pg (ref 26.0–34.0)
MCHC: 32.2 g/dL (ref 30.0–36.0)
MCV: 102.7 fL — ABNORMAL HIGH (ref 80.0–100.0)
Platelets: 163 10*3/uL (ref 150–400)
RBC: 4.12 MIL/uL — ABNORMAL LOW (ref 4.22–5.81)
RDW: 14.5 % (ref 11.5–15.5)
WBC: 6.6 10*3/uL (ref 4.0–10.5)
nRBC: 0 % (ref 0.0–0.2)

## 2022-10-14 LAB — URINALYSIS, ROUTINE W REFLEX MICROSCOPIC
Bilirubin Urine: NEGATIVE
Glucose, UA: NEGATIVE mg/dL
Hgb urine dipstick: NEGATIVE
Ketones, ur: 5 mg/dL — AB
Leukocytes,Ua: NEGATIVE
Nitrite: NEGATIVE
Protein, ur: NEGATIVE mg/dL
Specific Gravity, Urine: 1.043 — ABNORMAL HIGH (ref 1.005–1.030)
pH: 5 (ref 5.0–8.0)

## 2022-10-14 LAB — COMPREHENSIVE METABOLIC PANEL
ALT: 27 U/L (ref 0–44)
AST: 37 U/L (ref 15–41)
Albumin: 4.1 g/dL (ref 3.5–5.0)
Alkaline Phosphatase: 57 U/L (ref 38–126)
Anion gap: 8 (ref 5–15)
BUN: 21 mg/dL (ref 8–23)
CO2: 27 mmol/L (ref 22–32)
Calcium: 8.8 mg/dL — ABNORMAL LOW (ref 8.9–10.3)
Chloride: 102 mmol/L (ref 98–111)
Creatinine, Ser: 1.4 mg/dL — ABNORMAL HIGH (ref 0.61–1.24)
GFR, Estimated: 50 mL/min — ABNORMAL LOW (ref >60)
Glucose, Bld: 106 mg/dL — ABNORMAL HIGH (ref 70–99)
Potassium: 4.5 mmol/L (ref 3.5–5.1)
Sodium: 137 mmol/L (ref 135–145)
Total Bilirubin: 1 mg/dL (ref 0.3–1.2)
Total Protein: 6.6 g/dL (ref 6.5–8.1)

## 2022-10-14 LAB — LIPASE, BLOOD: Lipase: 25 U/L (ref 11–51)

## 2022-10-14 LAB — LACTIC ACID, PLASMA: Lactic Acid, Venous: 1.2 mmol/L (ref 0.5–1.9)

## 2022-10-14 MED ORDER — LACTATED RINGERS IV BOLUS
1000.0000 mL | Freq: Once | INTRAVENOUS | Status: AC
  Administered 2022-10-14: 1000 mL via INTRAVENOUS

## 2022-10-14 MED ORDER — ACETAMINOPHEN 650 MG RE SUPP: 650.0000 mg | Freq: Four times a day (QID) | RECTAL | Status: DC | PRN

## 2022-10-14 MED ORDER — HYDROMORPHONE HCL 1 MG/ML IJ SOLN
1.0000 mg | INTRAMUSCULAR | Status: DC | PRN
  Administered 2022-10-14 – 2022-10-15 (×4): 1 mg via INTRAVENOUS
  Filled 2022-10-14 (×4): qty 1

## 2022-10-14 MED ORDER — FENTANYL CITRATE PF 50 MCG/ML IJ SOSY
50.0000 ug | PREFILLED_SYRINGE | Freq: Once | INTRAMUSCULAR | Status: AC
  Administered 2022-10-14: 50 ug via INTRAVENOUS
  Filled 2022-10-14: qty 1

## 2022-10-14 MED ORDER — ONDANSETRON HCL 4 MG/2ML IJ SOLN
4.0000 mg | Freq: Once | INTRAMUSCULAR | Status: AC
  Administered 2022-10-14: 4 mg via INTRAVENOUS
  Filled 2022-10-14: qty 2

## 2022-10-14 MED ORDER — HYDRALAZINE HCL 20 MG/ML IJ SOLN: 10.0000 mg | Freq: Four times a day (QID) | INTRAMUSCULAR | Status: DC | PRN

## 2022-10-14 MED ORDER — PANTOPRAZOLE SODIUM 40 MG IV SOLR
40.0000 mg | Freq: Two times a day (BID) | INTRAVENOUS | Status: DC
  Administered 2022-10-14 – 2022-10-17 (×6): 40 mg via INTRAVENOUS
  Filled 2022-10-14 (×6): qty 10

## 2022-10-14 MED ORDER — IOHEXOL 350 MG/ML SOLN
80.0000 mL | Freq: Once | INTRAVENOUS | Status: AC | PRN
  Administered 2022-10-14: 80 mL via INTRAVENOUS

## 2022-10-14 MED ORDER — LACTATED RINGERS IV SOLN: INTRAVENOUS | Status: AC

## 2022-10-14 MED ORDER — METOPROLOL TARTRATE 5 MG/5ML IV SOLN: 5.0000 mg | Freq: Four times a day (QID) | INTRAVENOUS | Status: DC | PRN

## 2022-10-14 MED ORDER — ONDANSETRON HCL 4 MG/2ML IJ SOLN
4.0000 mg | Freq: Four times a day (QID) | INTRAMUSCULAR | Status: DC | PRN
  Administered 2022-10-16: 4 mg via INTRAVENOUS
  Filled 2022-10-14: qty 2

## 2022-10-14 MED ORDER — SODIUM CHLORIDE 0.9% FLUSH
3.0000 mL | Freq: Two times a day (BID) | INTRAVENOUS | Status: DC
  Administered 2022-10-14 – 2022-10-17 (×5): 3 mL via INTRAVENOUS

## 2022-10-14 MED ORDER — MORPHINE SULFATE (PF) 4 MG/ML IV SOLN
4.0000 mg | Freq: Once | INTRAVENOUS | Status: AC
  Administered 2022-10-14: 4 mg via INTRAVENOUS
  Filled 2022-10-14: qty 1

## 2022-10-14 NOTE — ED Notes (Signed)
ED TO INPATIENT HANDOFF REPORT  ED Nurse Name and Phone #: [phone]  S Name/Age/Gender [name] 82 y.o. male Room/Bed: 019C/019C  Code Status   Code Status: Full Code  Home/SNF/Other Home Patient oriented to: self, place, time, and situation Is this baseline? 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 Pt reports the pain started while eating a salad for lunch. Pt also reports SHOB and nausea. Pt reports taking a new probiotic prior to this episode.    Allergies Allergies  Allergen Reactions   Compazine [Prochlorperazine Edisylate] Other (See Comments)    EXTRAPYRAMIDAL MOVEMENT [Involuntary muscle movement]   Penicillins Nausea And Vomiting, Rash and Other (See Comments)    Has patient had a PCN reaction causing immediate rash, facial/tongue/throat swelling, SOB or lightheadedness with hypotension: #  #  #  YES  #  #  #  Has patient had a PCN reaction causing severe rash involving mucus membranes or skin necrosis: No Has patient had a PCN reaction that required hospitalization No Has patient had a PCN reaction occurring within the last 10 years: No If all of the above answers are "NO", then may proceed with Cephalosporin use.    Welchol [Colesevelam Hcl] Other (See Comments)    MYALGIAS [Leg aches]   Gemfibrozil Nausea Only    PATIENT TOLERATES   Red Yeast Rice [Cholestin] Other (See Comments)    flushing    Level of Care/Admitting Diagnosis ED Disposition     ED Disposition  Admit   Condition  --   Comment  Hospital Area: [facility] [[id number]]  Level of Care: Progressive [102]  Admit to Progressive based on following criteria: GI, ENDOCRINE disease patients with GI bleeding, acute liver failure or pancreatitis, stable with diabetic ketoacidosis or thyrotoxicosis (hypothyroid) state.  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 No recurrence since 1962.   Chest pain    a. 1998 Cath: nl cors;  b. 2009 Cath: nl cors.   Depression    Dysrhythmia    NO TROUBLE IN 1 YR    DR. PETER [place]    Esophageal reflux    Essential hypertension    History of Paroxysmal atrial flutter (HCC)    Hyperlipidemia    Incontinence of urine    Leg pain, bilateral    Nocturia    PONV (postoperative nausea and vomiting)    CONSTIPATED   Scarlet fever 1945   Spinal stenosis    Staph skin infection    LEFT GROIN  04/11/16  TX W/ DOXYCYCLINE   Ulcerative proctitis (HCC)    Past Surgical History:  Procedure Laterality Date   ANKLE RECONSTRUCTION Right 2007   [place]   ANTERIOR LAT LUMBAR FUSION Right 04/18/2016  Procedure: RIGHT LUMBAR THREE-FOUR, LUMBAR FOUR-FIVE ANTEROLATERAL LUMBAR INTERBODY FUSION;  Surgeon: Maeola Harman, MD;  Location: [facility] OR;  Service: Neurosurgery;  Laterality: Right;  RIGHT L3-4 L4-5 ANTEROLATERAL LUMBAR INTERBODY FUSION   BIOPSY  12/16/2020   Procedure: BIOPSY;  Surgeon: Kerin Salen, MD;  Location: [facility] ENDOSCOPY;  Service: Gastroenterology;;   CARDIAC CATHETERIZATION  03/03/2008   Left heart cardiac catheterization and coronary   CARDIAC CATHETERIZATION  1997   Dr Donnie Aho   CATARACT EXTRACTION W/ INTRAOCULAR LENS  IMPLANT, BILATERAL     2016   ESOPHAGEAL DILATION     2008    ESOPHAGOGASTRODUODENOSCOPY N/A 12/16/2020   Procedure: ESOPHAGOGASTRODUODENOSCOPY (EGD);  Surgeon: Kerin Salen, MD;  Location: [facility];  Service: Gastroenterology;  Laterality: N/A;   INNER EAR SURGERY     EAR INJECTION FOR MENIERES   KNEE ARTHROSCOPY  1991, 1995   LUMBAR LAMINECTOMY  12/2010   LUMBAR LAMINECTOMY/DECOMPRESSION MICRODISCECTOMY Left 08/15/2016   Procedure: Left Left three- four Redo laminectomy;  Surgeon: Maeola Harman, MD;  Location: [facility] OR;  Service: Neurosurgery;  Laterality: Left;  Left L3-4 Redo laminectomy   LUMBAR PERCUTANEOUS PEDICLE SCREW 2 LEVEL N/A 04/18/2016   Procedure: LUMBAR THREE-FOUR, LUMBAR FOUR-FIVE PERCUTANEOUS PEDICLE SCREW;  Surgeon: Maeola Harman, MD;  Location: [facility] OR;  Service: Neurosurgery;  Laterality: N/A;   TOTAL HIP ARTHROPLASTY Left 04/22/2017   TOTAL HIP ARTHROPLASTY Left 04/22/2017   Procedure: TOTAL HIP ARTHROPLASTY ANTERIOR APPROACH;  Surgeon: Marcene Corning, MD;  Location: [facility] OR;  Service: Orthopedics;  Laterality: Left;   TOTAL KNEE ARTHROPLASTY Right 11/21/2020   Procedure: RIGHT TOTAL KNEE ARTHROPLASTY;  Surgeon: Marcene Corning, MD;  Location: [facility] ORS;  Service: Orthopedics;  Laterality: Right;     A IV Location/Drains/Wounds Patient Lines/Drains/Airways Status     Active Line/Drains/Airways     Name Placement date Placement time Site Days   Peripheral IV 10/14/22 20 G Anterior;Distal;Right;Upper Arm 10/14/22  1358  Arm  less than 1   NG/OG Vented/Dual Lumen 16 Fr. Left nare 58 cm 10/14/22  1810  Left nare  less than 1            Intake/Output Last 24 hours  Intake/Output Summary (Last 24 hours) at 10/14/2022 2110 Last data filed at 10/14/2022 1736 Gross per 24 hour  Intake 1000 ml  Output --  Net 1000 ml    Labs/Imaging Results for orders placed or performed during the hospital encounter of 10/14/22 (from the past 48 hour(s))  Lipase, blood     Status: None   Collection Time: 10/14/22  1:50 PM  Result Value Ref Range    Lipase 25 11 - 51 U/L    Comment: Performed at [facility], [address], [place], [place] [address]  Comprehensive metabolic panel     Status: Abnormal   Collection Time: 10/14/22  1:50 PM  Result Value Ref Range   Sodium 137 135 - 145 mmol/L   Potassium 4.5 3.5 - 5.1 mmol/L   Chloride 102 98 - 111 mmol/L   CO2 27 22 - 32 mmol/L   Glucose, Bld 106 (H) 70 - 99 mg/dL    Comment: Glucose reference range applies only to samples taken after fasting for at least 8 hours.   BUN 21 8 - 23 mg/dL   Creatinine, Ser 6.04 (H) 0.61 - 1.24 mg/dL   Calcium 8.8 (L) 8.9 - 10.3 mg/dL   Total Protein 6.6 6.5 - 8.1 g/dL   Albumin 4.1 3.5 - 5.0 g/dL  AST 37 15 - 41 U/L   ALT 27 0 - 44 U/L   Alkaline Phosphatase 57 38 - 126 U/L   Total Bilirubin 1.0 0.3 - 1.2 mg/dL   GFR, Estimated 50 (L) >60 mL/min    Comment: (NOTE) Calculated using the CKD-EPI Creatinine Equation (2021)    Anion gap 8 5 - 15    Comment: Performed at [facility], [address] [place], [place] [address]  CBC     Status: Abnormal   Collection Time: 10/14/22  1:50 PM  Result Value Ref Range   WBC 6.6 4.0 - 10.5 K/uL   RBC 4.12 (L) 4.22 - 5.81 MIL/uL   Hemoglobin 13.6 13.0 - 17.0 g/dL   HCT 19.1 47.8 - 29.5 %   MCV 102.7 (H) 80.0 - 100.0 fL   MCH 33.0 26.0 - 34.0 pg   MCHC 32.2 30.0 - 36.0 g/dL   RDW 62.1 30.8 - 65.7 %   Platelets 163 150 - 400 K/uL   nRBC 0.0 0.0 - 0.2 %    Comment: Performed at [facility], [address], [place], [place] [address]  Lactic acid, plasma     Status: None   Collection Time: 10/14/22  4:47 PM  Result Value Ref Range   Lactic Acid, Venous 1.2 0.5 - 1.9 mmol/L    Comment: Performed at [facility], [address], [place], [place] [address]  Urinalysis, Routine w reflex microscopic -Urine, Clean Catch     Status: Abnormal   Collection Time: 10/14/22  5:07 PM  Result Value Ref Range   Color, Urine YELLOW YELLOW   APPearance CLEAR CLEAR   Specific Gravity, Urine  1.043 (H) 1.005 - 1.030   pH 5.0 5.0 - 8.0   Glucose, UA NEGATIVE NEGATIVE mg/dL   Hgb urine dipstick NEGATIVE NEGATIVE   Bilirubin Urine NEGATIVE NEGATIVE   Ketones, ur 5 (A) NEGATIVE mg/dL   Protein, ur NEGATIVE NEGATIVE mg/dL   Nitrite NEGATIVE NEGATIVE   Leukocytes,Ua NEGATIVE NEGATIVE    Comment: Performed at [facility], [address], [place], [place] [address]   DG Abd Portable 1 View  Result Date: 10/14/2022 CLINICAL DATA:  Check gastric catheter placement EXAM: PORTABLE ABDOMEN - 1 VIEW COMPARISON:  Film from 5 minutes previous FINDINGS: Gastric catheter is again noted with the tip in the stomach. It has withdrawn slightly in the interval from the prior exam although given the large hiatal hernia, the proximal side port likely is within the stomach. No other focal abnormality is noted. IMPRESSION: Slight withdrawal of gastric catheter as described. Electronically Signed   By: Alcide Clever M.D.   On: 10/14/2022 18:28   DG Abd Portable 1 View  Result Date: 10/14/2022 CLINICAL DATA:  Check gastric catheter placement EXAM: PORTABLE ABDOMEN - 1 VIEW COMPARISON:  CT from earlier in the same day. FINDINGS: Gastric catheter is noted within the stomach. Scattered bowel gas is noted without free air. IMPRESSION: Gastric catheter within the stomach. Electronically Signed   By: Alcide Clever M.D.   On: 10/14/2022 18:27   CT ANGIO ABDOMEN PELVIS  W &/OR WO CONTRAST  Result Date: 10/14/2022 CLINICAL DATA:  Abdominal pain, evaluate for mesenteric ischemia. EXAM: CTA ABDOMEN AND PELVIS WITHOUT AND WITH CONTRAST TECHNIQUE: Multidetector CT imaging of the abdomen and pelvis was performed using the standard protocol during bolus administration of intravenous contrast. Multiplanar reconstructed images and MIPs were obtained and reviewed to evaluate the vascular  anatomy. RADIATION DOSE REDUCTION: This exam was performed according to the departmental dose-optimization program which includes automated  exposure control, adjustment of the mA and/or kV according to patient size and/or use of iterative reconstruction technique. CONTRAST:  80mL OMNIPAQUE IOHEXOL 350 MG/ML SOLN COMPARISON:  CT abdomen and pelvis 12/15/2020 FINDINGS: VASCULAR Aorta: Normal caliber aorta without aneurysm, dissection, vasculitis or significant stenosis. There is mild calcified atherosclerotic disease throughout the aorta. Celiac: Patent without evidence of aneurysm, dissection, vasculitis or significant stenosis. SMA: Patent without evidence of aneurysm, dissection, vasculitis or significant stenosis. Renals: Both renal arteries are patent without evidence of aneurysm, dissection, vasculitis, fibromuscular dysplasia or significant stenosis. IMA: Patent without evidence of aneurysm, dissection, vasculitis or significant stenosis. Inflow: Patent without evidence of aneurysm, dissection, vasculitis or significant stenosis. Proximal Outflow: Bilateral common femoral and visualized portions of the superficial and profunda femoral arteries are patent without evidence of aneurysm, dissection, vasculitis or significant stenosis. Veins: No obvious venous abnormality. Review of the MIP images confirms the above findings. NON-VASCULAR Lower chest: There is atelectasis in the left lower lobe. Hepatobiliary: No focal liver abnormality is seen. No gallstones, gallbladder wall thickening, or biliary dilatation. Pancreas: Unremarkable. No pancreatic ductal dilatation or surrounding inflammatory changes. Spleen: Normal in size without focal abnormality. Adrenals/Urinary Tract: Bilateral renal cysts are present measuring up to 3.3 cm there is no hydronephrosis. Adrenal glands bladder are within normal limits. Stomach/Bowel: Large hiatal hernia is present containing the proximal stomach. The fundus of the stomach is below the diaphragm of the gastric antrum is above the diaphragm compatible with mesentero-axial volvulus. The stomach is dilated inferior and  superior to the diaphragm. Appendix appears normal. No evidence of bowel wall thickening or inflammation. There is diffuse colonic diverticulosis. Lymphatic: No enlarged lymph nodes are seen. Reproductive: Prostate gland is significantly enlarged. Other: There is a small fat containing umbilical hernia. No free fluid. Musculoskeletal: L3-L5 posterior fusion hardware is present. Left hip arthroplasty is present. IMPRESSION: 1. No evidence for mesenteric ischemia. 2. No acute vascular pathology. Aortic Atherosclerosis (ICD10-I70.0). NON-VASCULAR 1. 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 The stomach is dilated above and below the diaphragm. 2. Colonic diverticulosis. 3. Prostatomegaly. Electronically Signed   By: Darliss Cheney M.D.   On: 10/14/2022 16:13   DG Chest Portable 1 View  Result Date: 10/14/2022 CLINICAL DATA:  Chest and abdominal pain. Shortness of breath and nausea. EXAM: PORTABLE CHEST 1 VIEW COMPARISON:  03/17/2022 FINDINGS: Shallow inspiration. Heart size and pulmonary vascularity are normal for technique. Fibrosis or linear atelectasis in the left base. Lungs are otherwise clear and expanded. No pleural effusions. No pneumothorax. Mediastinal contours appear intact. Large esophageal hiatal hernia behind the heart. Similar appearance to previous study. IMPRESSION: Linear fibrosis or atelectasis in the left base. Large esophageal hiatal hernia. No evidence of active pulmonary disease. Electronically Signed   By: Burman Nieves M.D.   On: 10/14/2022 15:24    Pending Labs Unresulted Labs (From admission, onward)     Start     Ordered   10/15/22 0500  CBC  Tomorrow morning,   R        10/14/22 1928   10/15/22 0500  Basic metabolic panel  Tomorrow morning,   R        10/14/22 1928            Vitals/Pain Today's Vitals   10/14/22 1830 10/14/22 1957 10/14/22 2030 10/14/22 2107  BP: (!) 137/102     Pulse:  88     Resp: 12     Temp:       TempSrc:      SpO2: 95%     PainSc:  5  Asleep Asleep    Isolation Precautions No active isolations  Medications Medications  ondansetron (ZOFRAN) injection 4 mg (has no administration in time range)  HYDROmorphone (DILAUDID) injection 1 mg (1 mg Intravenous Given 10/14/22 2001)  pantoprazole (PROTONIX) injection 40 mg (has no administration in time range)  sodium chloride flush (NS) 0.9 % injection 3 mL (has no administration in time range)  lactated ringers infusion ( Intravenous New Bag/Given 10/14/22 1946)  acetaminophen (TYLENOL) suppository 650 mg (has no administration in time range)  hydrALAZINE (APRESOLINE) injection 10 mg (has no administration in time range)  metoprolol tartrate (LOPRESSOR) injection 5 mg (has no administration in time range)  morphine (PF) 4 MG/ML injection 4 mg (4 mg Intravenous Given 10/14/22 1525)  ondansetron (ZOFRAN) injection 4 mg (4 mg Intravenous Given 10/14/22 1525)  lactated ringers bolus 1,000 mL (0 mLs Intravenous Stopped 10/14/22 1736)  iohexol (OMNIPAQUE) 350 MG/ML injection 80 mL (80 mLs Intravenous Contrast Given 10/14/22 1555)  fentaNYL (SUBLIMAZE) injection 50 mcg (50 mcg Intravenous Given 10/14/22 1645)  ondansetron (ZOFRAN) injection 4 mg (4 mg Intravenous Given 10/14/22 1821)  fentaNYL (SUBLIMAZE) injection 50 mcg (50 mcg Intravenous Given 10/14/22 1822)    Mobility walks     Focused Assessments GI   R Recommendations: See Admitting Provider Note  Report given to:   Additional Notes:  Please call with any questions

## 2022-10-14 NOTE — ED Triage Notes (Signed)
Pt [facility] from [facility] nursing facility with reports of abdominal pain. Pt reports the pain started while eating a salad for lunch. Pt also reports SHOB and nausea. Pt reports taking a new probiotic prior to this episode.

## 2022-10-14 NOTE — ED Provider Notes (Signed)
[place] EMERGENCY DEPARTMENT AT [facility] Provider Note   CSN: [id number] Arrival date & time: 10/14/22  1345     History  Chief Complaint  Patient presents with   Abdominal Pain    [name] is a 82 y.o. male. 82 year old male history of atrial fibrillation and atrial flutter on Eliquis, depression, GERD, hyperlipidemia presenting for abdominal pain.  Patient states he was doing well until around 1 PM.  He ate a salad and shortly after developed severe diffuse abdominal pain.  He has some mild chest discomfort as well.  He has not had pain currently this before.  He does have a history of prior gastric ulcer but states this is different.  He has felt hot but no objective fevers.  Some nausea but no vomiting.  Normal bowel movements without melena or hematochezia.  He feels somewhat short of breath from pain when he breathes in his abdomen, no pleuritic chest pain.   Abdominal Pain Associated symptoms: chest pain and nausea        Home Medications Prior to Admission medications   Medication Sig Start Date End Date Taking? Authorizing Provider  acetaminophen (TYLENOL) 325 MG tablet Take 2 tablets (650 mg total) by mouth every 6 (six) hours as needed for mild pain (or Fever >/= 101). 12/18/20   Lonia Blood, MD  carvedilol (COREG) 3.125 MG tablet Take 3.125 mg by mouth at bedtime.     [provider]  clonazePAM (KLONOPIN) 0.5 MG tablet take 1/2-1 TABLET BY MOUTH EVERYDAY AT BEDTIME AS NEEDED 02/06/22   Glean Salvo, NP  DULoxetine (CYMBALTA) 60 MG capsule Take 1 capsule (60 mg total) by mouth daily. 09/18/22   Glean Salvo, NP  ELIQUIS 5 MG TABS tablet Take 1 tablet (5 mg total) by mouth 2 (two) times daily. 07/15/22   [place], Peter M, MD  ferrous sulfate 324 (65 Fe) MG TBEC Take 324 mg by mouth daily.    [provider]  Multiple Vitamins-Minerals (MULTIVITAMIN PO) Take 1 tablet by mouth daily.    [provider]  nitroGLYCERIN  (NITROSTAT) 0.4 MG SL tablet Place 0.4 mg under the tongue every 5 (five) minutes as needed for chest pain.    [provider]  pantoprazole (PROTONIX) 40 MG tablet Take 1 tablet (40 mg total) by mouth 2 (two) times daily before a meal. Patient taking differently: Take 40 mg by mouth 2 (two) times daily before a meal. Take 1 tablet by mouth twice a day 4d/week alternating with 1 tablet once a day 3d/week 12/18/20   Lonia Blood, MD  polyethylene glycol (MIRALAX / Ethelene Hal) packet Take 17 g by mouth daily as needed for mild constipation.    [provider]  pregabalin (LYRICA) 100 MG capsule TAKE FOUR CAPSULES BY MOUTH AT bedtime 09/02/22   Glean Salvo, NP  rOPINIRole (REQUIP) 0.5 MG tablet Take 1 tablet at 6 PM, take 1 tablet at bedtime 09/18/22   Glean Salvo, NP  rosuvastatin (CRESTOR) 10 MG tablet TAKE 1 TABLET ONCE DAILY OR AS DIRECTED. 04/22/17   [place], Peter M, MD  tamsulosin (FLOMAX) 0.4 MG CAPS capsule Take 0.4 mg by mouth daily. 12/08/18   [provider]  traMADol (ULTRAM) 50 MG tablet Take 1 tablet (50 mg total) by mouth every 6 (six) hours as needed for moderate pain. 12/18/20   Lonia Blood, MD      Allergies    Compazine [prochlorperazine edisylate], Penicillins,  Welchol [colesevelam hcl], Gemfibrozil, and Red yeast rice [cholestin]    Review of Systems   Review of Systems  Cardiovascular:  Positive for chest pain.  Gastrointestinal:  Positive for abdominal pain and nausea.  All other systems reviewed and are negative.   Physical Exam Updated Vital Signs BP 104/66   Pulse 88   Temp 98.1 F (36.7 C) (Oral)   Resp 10   Wt 108.3 kg   SpO2 97%   BMI 34.25 kg/m  Physical Exam Vitals and nursing note reviewed.  Constitutional:      Comments: Appears uncomfortable  HENT:     Head: Normocephalic and atraumatic.  Eyes:     Conjunctiva/sclera: Conjunctivae normal.  Cardiovascular:     Rate and Rhythm: Normal rate and regular rhythm.      Heart sounds: No murmur heard. Pulmonary:     Effort: Pulmonary effort is normal. No respiratory distress.     Breath sounds: Normal breath sounds.  Abdominal:     General: Abdomen is flat. There is no distension.     Palpations: Abdomen is soft.     Tenderness: There is generalized abdominal tenderness. There is no right CVA tenderness, left CVA tenderness, guarding or rebound.  Musculoskeletal:        General: No swelling.     Cervical back: Neck supple.  Skin:    General: Skin is warm and dry.     Capillary Refill: Capillary refill takes less than 2 seconds.  Neurological:     Mental Status: He is alert.  Psychiatric:        Mood and Affect: Mood normal.     ED Results / Procedures / Treatments   Labs (all labs ordered are listed, but only abnormal results are displayed) Labs Reviewed  COMPREHENSIVE METABOLIC PANEL - Abnormal; Notable for the following components:      Result Value   Glucose, Bld 106 (*)    Creatinine, Ser 1.40 (*)    Calcium 8.8 (*)    GFR, Estimated 50 (*)    All other components within normal limits  CBC - Abnormal; Notable for the following components:   RBC 4.12 (*)    MCV 102.7 (*)    All other components within normal limits  URINALYSIS, ROUTINE W REFLEX MICROSCOPIC - Abnormal; Notable for the following components:   Specific Gravity, Urine 1.043 (*)    Ketones, ur 5 (*)    All other components within normal limits  LIPASE, BLOOD  LACTIC ACID, PLASMA  CBC  BASIC METABOLIC PANEL    EKG None  Radiology DG Abd Portable 1 View  Result Date: 10/14/2022 CLINICAL DATA:  Check gastric catheter placement EXAM: PORTABLE ABDOMEN - 1 VIEW COMPARISON:  Film from 5 minutes previous FINDINGS: Gastric catheter is again noted with the tip in the stomach. It has withdrawn slightly in the interval from the prior exam although given the large hiatal hernia, the proximal side port likely is within the stomach. No other focal abnormality is noted.  IMPRESSION: Slight withdrawal of gastric catheter as described. Electronically Signed   By: Alcide Clever M.D.   On: 10/14/2022 18:28   DG Abd Portable 1 View  Result Date: 10/14/2022 CLINICAL DATA:  Check gastric catheter placement EXAM: PORTABLE ABDOMEN - 1 VIEW COMPARISON:  CT from earlier in the same day. FINDINGS: Gastric catheter is noted within the stomach. Scattered bowel gas is noted without free air. IMPRESSION: Gastric catheter within the stomach. Electronically Signed   By: Loraine Leriche  Lukens M.D.   On: 10/14/2022 18:27   CT ANGIO ABDOMEN PELVIS  W &/OR WO CONTRAST  Result Date: 10/14/2022 CLINICAL DATA:  Abdominal pain, evaluate for mesenteric ischemia. EXAM: CTA ABDOMEN AND PELVIS WITHOUT AND WITH CONTRAST TECHNIQUE: Multidetector CT imaging of the abdomen and pelvis was performed using the standard protocol during bolus administration of intravenous contrast. Multiplanar reconstructed images and MIPs were obtained and reviewed to evaluate the vascular anatomy. RADIATION DOSE REDUCTION: This exam was performed according to the departmental dose-optimization program which includes automated exposure control, adjustment of the mA and/or kV according to patient size and/or use of iterative reconstruction technique. CONTRAST:  80mL OMNIPAQUE IOHEXOL 350 MG/ML SOLN COMPARISON:  CT abdomen and pelvis 12/15/2020 FINDINGS: VASCULAR Aorta: Normal caliber aorta without aneurysm, dissection, vasculitis or significant stenosis. There is mild calcified atherosclerotic disease throughout the aorta. Celiac: Patent without evidence of aneurysm, dissection, vasculitis or significant stenosis. SMA: Patent without evidence of aneurysm, dissection, vasculitis or significant stenosis. Renals: Both renal arteries are patent without evidence of aneurysm, dissection, vasculitis, fibromuscular dysplasia or significant stenosis. IMA: Patent without evidence of aneurysm, dissection, vasculitis or significant stenosis. Inflow:  Patent without evidence of aneurysm, dissection, vasculitis or significant stenosis. Proximal Outflow: Bilateral common femoral and visualized portions of the superficial and profunda femoral arteries are patent without evidence of aneurysm, dissection, vasculitis or significant stenosis. Veins: No obvious venous abnormality. Review of the MIP images confirms the above findings. NON-VASCULAR Lower chest: There is atelectasis in the left lower lobe. Hepatobiliary: No focal liver abnormality is seen. No gallstones, gallbladder wall thickening, or biliary dilatation. Pancreas: Unremarkable. No pancreatic ductal dilatation or surrounding inflammatory changes. Spleen: Normal in size without focal abnormality. Adrenals/Urinary Tract: Bilateral renal cysts are present measuring up to 3.3 cm there is no hydronephrosis. Adrenal glands bladder are within normal limits. Stomach/Bowel: Large hiatal hernia is present containing the proximal stomach. The fundus of the stomach is below the diaphragm of the gastric antrum is above the diaphragm compatible with mesentero-axial volvulus. The stomach is dilated inferior and superior to the diaphragm. Appendix appears normal. No evidence of bowel wall thickening or inflammation. There is diffuse colonic diverticulosis. Lymphatic: No enlarged lymph nodes are seen. Reproductive: Prostate gland is significantly enlarged. Other: There is a small fat containing umbilical hernia. No free fluid. Musculoskeletal: L3-L5 posterior fusion hardware is present. Left hip arthroplasty is present. IMPRESSION: 1. No evidence for mesenteric ischemia. 2. No acute vascular pathology. Aortic Atherosclerosis (ICD10-I70.0). NON-VASCULAR 1. 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 The stomach is dilated above and below the diaphragm. 2. Colonic diverticulosis. 3. Prostatomegaly. Electronically Signed   By: Darliss Cheney M.D.   On: 10/14/2022 16:13   DG Chest  Portable 1 View  Result Date: 10/14/2022 CLINICAL DATA:  Chest and abdominal pain. Shortness of breath and nausea. EXAM: PORTABLE CHEST 1 VIEW COMPARISON:  03/17/2022 FINDINGS: Shallow inspiration. Heart size and pulmonary vascularity are normal for technique. Fibrosis or linear atelectasis in the left base. Lungs are otherwise clear and expanded. No pleural effusions. No pneumothorax. Mediastinal contours appear intact. Large esophageal hiatal hernia behind the heart. Similar appearance to previous study. IMPRESSION: Linear fibrosis or atelectasis in the left base. Large esophageal hiatal hernia. No evidence of active pulmonary disease. Electronically Signed   By: Burman Nieves M.D.   On: 10/14/2022 15:24    Procedures Procedures    Medications Ordered in ED Medications  ondansetron (ZOFRAN) injection 4 mg (has no administration in  time range)  HYDROmorphone (DILAUDID) injection 1 mg (1 mg Intravenous Given 10/14/22 2307)  pantoprazole (PROTONIX) injection 40 mg (40 mg Intravenous Given 10/14/22 2224)  sodium chloride flush (NS) 0.9 % injection 3 mL (3 mLs Intravenous Given 10/14/22 2223)  lactated ringers infusion ( Intravenous Infusion Verify 10/14/22 2300)  acetaminophen (TYLENOL) suppository 650 mg (has no administration in time range)  hydrALAZINE (APRESOLINE) injection 10 mg (has no administration in time range)  metoprolol tartrate (LOPRESSOR) injection 5 mg (has no administration in time range)  morphine (PF) 4 MG/ML injection 4 mg (4 mg Intravenous Given 10/14/22 1525)  ondansetron (ZOFRAN) injection 4 mg (4 mg Intravenous Given 10/14/22 1525)  lactated ringers bolus 1,000 mL (0 mLs Intravenous Stopped 10/14/22 1736)  iohexol (OMNIPAQUE) 350 MG/ML injection 80 mL (80 mLs Intravenous Contrast Given 10/14/22 1555)  fentaNYL (SUBLIMAZE) injection 50 mcg (50 mcg Intravenous Given 10/14/22 1645)  ondansetron (ZOFRAN) injection 4 mg (4 mg Intravenous Given 10/14/22 1821)  fentaNYL (SUBLIMAZE)  injection 50 mcg (50 mcg Intravenous Given 10/14/22 1822)    ED Course/ Medical Decision Making/ A&P Clinical Course as of 10/14/22 2310  Mon Oct 14, 2022  1616 Radiology: 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 Corliss Skains [JD]  1756 [facility] - GI [JD]    Clinical Course User Index [JD] Fulton Reek, MD                             Medical Decision Making Amount and/or Complexity of Data Reviewed Labs: ordered. Radiology: ordered.  Risk Prescription drug management. Decision regarding hospitalization.   82 year old male presenting for sudden onset abdominal pain.  Vital signs noted for hypertension.  Exam he is uncomfortable, diffuse abdominal tenderness without peritonitis.  Pain is somewhat out of proportion to exam, and I am concerned for possible mesenteric ischemia given history of atrial fibrillation although he is anticoagulated.  Will obtain CTA.  Lab work reviewed notable for baseline renal function, CBC unremarkable.  Lipase is normal as well.  Urinalysis pending.  Will add on troponin given some chest discomfort as well although seems referred.  Has good pulses in all extremities and no neurologic deficits, low concern for dissection.  Chest x-ray reviewed, he has significant hiatal hernia but no acute cardiopulmonary abnormality.  Notified by radiology, no signs of mesenteric ischemia but does have gastric volvulus with dilation of the stomach both above and below the diaphragm with hiatal hernia.  Consistent with his symptoms.  General surgery was consulted.  Will call general surgery and reach them at 1657.  We reached Dr. Corliss Skains who said he would pass this along to the surgeon taking over 5 PM.  He recommended placing NG tube.  Lactic acid is pending.  Lactic acid is normal.  Surgery evaluated recommend NG tube placement and medicine admission.  NG tube was placed but he did have some discomfort with this, NG tube appeared to be in the stomach.   Retracted a few centimeters, still in the stomach and had improvement in pain.  I discussed the patient with the medicine service, and he was admitted for further management.  I did talk with GI as well, who plans for endoscopy tomorrow for further evaluation.        Final Clinical Impression(s) / ED Diagnoses Final diagnoses:  None    Rx / DC Orders ED Discharge Orders     None  Fulton Reek, MD 10/14/22 2310    Glendora Score, MD 10/15/22 1536

## 2022-10-14 NOTE — Hospital Course (Addendum)
[name] is a 82 y.o. 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  General surgery and GI consulted.  NG tube placed and plan for EGD in the morning.

## 2022-10-14 NOTE — ED Notes (Signed)
Portable monitor applied and patient transported to the floor by Jodie Echevaria, RN

## 2022-10-14 NOTE — Consult Note (Signed)
Reason for Consult:HH with possible gastric volvulus Referring Physician: Wyn Forster Kommor  [name] is an 82 y.o. male.  HPI: 82yo M with PMHx as below has known HX of a hiatal hernia. He was eating a salad for lunch when he got severe burning and fullness in hie epigastrium. He also had some SOB. He came to the ED for evaluation and CT angio og the C/A/P shows the [facility] with evidence of mesoaxial volvulus. His son and friend are at bedside. He reports his daughter is a Scientist, clinical (histocompatibility and immunogenetics) here. He has had many surgeries but none on his abdomen.   Past Medical History:  Diagnosis Date   Anginal pain (HCC)    NONE IN 3 YEARS   Arthritis    Atrial fibrillation (HCC) 1962   a. No recurrence since 1962.   Chest pain    a. 1998 Cath: nl cors;  b. 2009 Cath: nl cors.   Depression    Dysrhythmia    NO TROUBLE IN 1 YR    DR. PETER [place]    Esophageal reflux    Essential hypertension    History of Paroxysmal atrial flutter (HCC)    Hyperlipidemia    Incontinence of urine    Leg pain, bilateral    Nocturia    PONV (postoperative nausea and vomiting)    CONSTIPATED   Scarlet fever 1945   Spinal stenosis    Staph skin infection    LEFT GROIN  04/11/16  TX W/ DOXYCYCLINE   Ulcerative proctitis (HCC)     Past Surgical History:  Procedure Laterality Date   ANKLE RECONSTRUCTION Right 2007   [place]   ANTERIOR LAT LUMBAR FUSION Right 04/18/2016   Procedure: RIGHT LUMBAR THREE-FOUR, LUMBAR FOUR-FIVE ANTEROLATERAL LUMBAR INTERBODY FUSION;  Surgeon: Maeola Harman, MD;  Location: [facility] OR;  Service: Neurosurgery;  Laterality: Right;  RIGHT L3-4 L4-5 ANTEROLATERAL LUMBAR INTERBODY FUSION   BIOPSY  12/16/2020   Procedure: BIOPSY;  Surgeon: Kerin Salen, MD;  Location: [facility];  Service: Gastroenterology;;   CARDIAC CATHETERIZATION  03/03/2008   Left heart cardiac catheterization and coronary   CARDIAC CATHETERIZATION  1997   Dr Donnie Aho   CATARACT EXTRACTION W/ INTRAOCULAR LENS  IMPLANT, BILATERAL     2016    ESOPHAGEAL DILATION     2008   ESOPHAGOGASTRODUODENOSCOPY N/A 12/16/2020   Procedure: ESOPHAGOGASTRODUODENOSCOPY (EGD);  Surgeon: Kerin Salen, MD;  Location: [facility];  Service: Gastroenterology;  Laterality: N/A;   INNER EAR SURGERY     EAR INJECTION FOR MENIERES   KNEE ARTHROSCOPY  1991, 1995   LUMBAR LAMINECTOMY  12/2010   LUMBAR LAMINECTOMY/DECOMPRESSION MICRODISCECTOMY Left 08/15/2016   Procedure: Left Left three- four Redo laminectomy;  Surgeon: Maeola Harman, MD;  Location: [facility] OR;  Service: Neurosurgery;  Laterality: Left;  Left L3-4 Redo laminectomy   LUMBAR PERCUTANEOUS PEDICLE SCREW 2 LEVEL N/A 04/18/2016   Procedure: LUMBAR THREE-FOUR, LUMBAR FOUR-FIVE PERCUTANEOUS PEDICLE SCREW;  Surgeon: Maeola Harman, MD;  Location: [facility] OR;  Service: Neurosurgery;  Laterality: N/A;   TOTAL HIP ARTHROPLASTY Left 04/22/2017   TOTAL HIP ARTHROPLASTY Left 04/22/2017   Procedure: TOTAL HIP ARTHROPLASTY ANTERIOR APPROACH;  Surgeon: Marcene Corning, MD;  Location: [facility] OR;  Service: Orthopedics;  Laterality: Left;   TOTAL KNEE ARTHROPLASTY Right 11/21/2020   Procedure: RIGHT TOTAL KNEE ARTHROPLASTY;  Surgeon: Marcene Corning, MD;  Location: [facility];  Service: Orthopedics;  Laterality: Right;    Family History  Problem Relation Age of Onset   Prostate cancer Father  Heart attack Brother    Kidney cancer Brother    Brain cancer Brother     Social History:  reports that he has been smoking pipe. He has never used smokeless tobacco. He reports current alcohol use of about 1.0 standard drink of alcohol per week. He reports that he does not use drugs.  Allergies:  Allergies  Allergen Reactions   Compazine [Prochlorperazine Edisylate] Other (See Comments)    EXTRAPYRAMIDAL MOVEMENT [Involuntary muscle movement]   Penicillins Nausea And Vomiting, Rash and Other (See Comments)    Has patient had a PCN reaction causing immediate rash, facial/tongue/throat swelling, SOB or lightheadedness with hypotension:  #  #  #  YES  #  #  #  Has patient had a PCN reaction causing severe rash involving mucus membranes or skin necrosis: No Has patient had a PCN reaction that required hospitalization No Has patient had a PCN reaction occurring within the last 10 years: No If all of the above answers are "NO", then may proceed with Cephalosporin use.    Welchol [Colesevelam Hcl] Other (See Comments)    MYALGIAS [Leg aches]   Gemfibrozil Nausea Only    PATIENT TOLERATES   Red Yeast Rice [Cholestin] Other (See Comments)    flushing    Medications: I have reviewed the patient's current medications.  Results for orders placed or performed during the hospital encounter of 10/14/22 (from the past 48 hour(s))  Lipase, blood     Status: None   Collection Time: 10/14/22  1:50 PM  Result Value Ref Range   Lipase 25 11 - 51 U/L    Comment: Performed at [facility], [address], [place], [place] [address]  Comprehensive metabolic panel     Status: Abnormal   Collection Time: 10/14/22  1:50 PM  Result Value Ref Range   Sodium 137 135 - 145 mmol/L   Potassium 4.5 3.5 - 5.1 mmol/L   Chloride 102 98 - 111 mmol/L   CO2 27 22 - 32 mmol/L   Glucose, Bld 106 (H) 70 - 99 mg/dL    Comment: Glucose reference range applies only to samples taken after fasting for at least 8 hours.   BUN 21 8 - 23 mg/dL   Creatinine, Ser 6.21 (H) 0.61 - 1.24 mg/dL   Calcium 8.8 (L) 8.9 - 10.3 mg/dL   Total Protein 6.6 6.5 - 8.1 g/dL   Albumin 4.1 3.5 - 5.0 g/dL   AST 37 15 - 41 U/L   ALT 27 0 - 44 U/L   Alkaline Phosphatase 57 38 - 126 U/L   Total Bilirubin 1.0 0.3 - 1.2 mg/dL   GFR, Estimated 50 (L) >60 mL/min    Comment: (NOTE) Calculated using the CKD-EPI Creatinine Equation (2021)    Anion gap 8 5 - 15    Comment: Performed at [facility], [address], [place], [place] [address]  CBC     Status: Abnormal   Collection Time: 10/14/22  1:50 PM  Result Value Ref Range   WBC 6.6 4.0 - 10.5 K/uL   RBC 4.12 (L)  4.22 - 5.81 MIL/uL   Hemoglobin 13.6 13.0 - 17.0 g/dL   HCT 78.4 69.6 - 29.5 %   MCV 102.7 (H) 80.0 - 100.0 fL   MCH 33.0 26.0 - 34.0 pg   MCHC 32.2 30.0 - 36.0 g/dL   RDW 28.4 13.2 - 44.0 %   Platelets 163 150 - 400 K/uL   nRBC 0.0  0.0 - 0.2 %    Comment: Performed at [facility], [address], [place], [place] [address]    CT ANGIO ABDOMEN PELVIS  W &/OR WO CONTRAST  Result Date: 10/14/2022 CLINICAL DATA:  Abdominal pain, evaluate for mesenteric ischemia. EXAM: CTA ABDOMEN AND PELVIS WITHOUT AND WITH CONTRAST TECHNIQUE: Multidetector CT imaging of the abdomen and pelvis was performed using the standard protocol during bolus administration of intravenous contrast. Multiplanar reconstructed images and MIPs were obtained and reviewed to evaluate the vascular anatomy. RADIATION DOSE REDUCTION: This exam was performed according to the departmental dose-optimization program which includes automated exposure control, adjustment of the mA and/or kV according to patient size and/or use of iterative reconstruction technique. CONTRAST:  80mL OMNIPAQUE IOHEXOL 350 MG/ML SOLN COMPARISON:  CT abdomen and pelvis 12/15/2020 FINDINGS: VASCULAR Aorta: Normal caliber aorta without aneurysm, dissection, vasculitis or significant stenosis. There is mild calcified atherosclerotic disease throughout the aorta. Celiac: Patent without evidence of aneurysm, dissection, vasculitis or significant stenosis. SMA: Patent without evidence of aneurysm, dissection, vasculitis or significant stenosis. Renals: Both renal arteries are patent without evidence of aneurysm, dissection, vasculitis, fibromuscular dysplasia or significant stenosis. IMA: Patent without evidence of aneurysm, dissection, vasculitis or significant stenosis. Inflow: Patent without evidence of aneurysm, dissection, vasculitis or significant stenosis. Proximal Outflow: Bilateral common femoral and visualized portions of the superficial and profunda femoral  arteries are patent without evidence of aneurysm, dissection, vasculitis or significant stenosis. Veins: No obvious venous abnormality. Review of the MIP images confirms the above findings. NON-VASCULAR Lower chest: There is atelectasis in the left lower lobe. Hepatobiliary: No focal liver abnormality is seen. No gallstones, gallbladder wall thickening, or biliary dilatation. Pancreas: Unremarkable. No pancreatic ductal dilatation or surrounding inflammatory changes. Spleen: Normal in size without focal abnormality. Adrenals/Urinary Tract: Bilateral renal cysts are present measuring up to 3.3 cm there is no hydronephrosis. Adrenal glands bladder are within normal limits. Stomach/Bowel: Large hiatal hernia is present containing the proximal stomach. The fundus of the stomach is below the diaphragm of the gastric antrum is above the diaphragm compatible with mesentero-axial volvulus. The stomach is dilated inferior and superior to the diaphragm. Appendix appears normal. No evidence of bowel wall thickening or inflammation. There is diffuse colonic diverticulosis. Lymphatic: No enlarged lymph nodes are seen. Reproductive: Prostate gland is significantly enlarged. Other: There is a small fat containing umbilical hernia. No free fluid. Musculoskeletal: L3-L5 posterior fusion hardware is present. Left hip arthroplasty is present. IMPRESSION: 1. No evidence for mesenteric ischemia. 2. No acute vascular pathology. Aortic Atherosclerosis (ICD10-I70.0). NON-VASCULAR 1. 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 The stomach is dilated above and below the diaphragm. 2. Colonic diverticulosis. 3. Prostatomegaly. Electronically Signed   By: Darliss Cheney M.D.   On: 10/14/2022 16:13   DG Chest Portable 1 View  Result Date: 10/14/2022 CLINICAL DATA:  Chest and abdominal pain. Shortness of breath and nausea. EXAM: PORTABLE CHEST 1 VIEW COMPARISON:  03/17/2022 FINDINGS: Shallow  inspiration. Heart size and pulmonary vascularity are normal for technique. Fibrosis or linear atelectasis in the left base. Lungs are otherwise clear and expanded. No pleural effusions. No pneumothorax. Mediastinal contours appear intact. Large esophageal hiatal hernia behind the heart. Similar appearance to previous study. IMPRESSION: Linear fibrosis or atelectasis in the left base. Large esophageal hiatal hernia. No evidence of active pulmonary disease. Electronically Signed   By: Burman Nieves M.D.   On: 10/14/2022 15:24    Review of Systems  Constitutional:  Positive for appetite change.  HENT: Negative.    Eyes: Negative.   Respiratory:  Positive for chest tightness and shortness of breath.   Cardiovascular:  Positive for chest pain.  Gastrointestinal:  Positive for abdominal pain and nausea.  Endocrine: Negative.   Genitourinary: Negative.   Musculoskeletal: Negative.   Skin: Negative.   Allergic/Immunologic: Negative.   Neurological: Negative.   Hematological: Negative.   Psychiatric/Behavioral: Negative.     Blood pressure (!) 145/101, pulse 81, temperature 97.9 F (36.6 C), temperature source Oral, resp. rate 15, SpO2 98 %. Physical Exam Cardiovascular:     Rate and Rhythm: Normal rate and regular rhythm.  Pulmonary:     Effort: Pulmonary effort is normal. No respiratory distress.     Breath sounds: Normal breath sounds. No wheezing or rales.  Abdominal:     General: Abdomen is flat. Bowel sounds are decreased.     Palpations: Abdomen is soft.     Tenderness: There is abdominal tenderness in the epigastric area. There is no guarding or rebound.     Comments: Mild epigastric tenderness  Skin:    General: Skin is warm.  Neurological:     Mental Status: He is alert and oriented to person, place, and time.  Psychiatric:        Mood and Affect: Mood normal.     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 Recommend  NPO, NGT to [facility], GI evaluation (had EGD by Dr. Marca Ancona in the past). No need for emergent surgery but we will follow closely with you.  Liz Malady 10/14/2022, 5:20 PM

## 2022-10-14 NOTE — H&P (Addendum)
History and Physical    [name]:[id number] DOB: [birth year] DOA: 10/14/2022  PCP: [name], MD  [facility]  I have personally briefly reviewed patient's old medical records in [facility]  Chief Complaint: Abdominal pain  HPI: [name] is a 82 y.o. male with medical history significant for permanent atrial fibrillation on Eliquis, HTN, CKD stage IIIa, HLD, history of GI bleed due to duodenal ulcer, peripheral neuropathy, RLS who presented to the ED for evaluation of abdominal pain.  Patient states he ate a salad for lunch around 1 PM.  Afterwards he developed severe upper abdominal pain.  He was nauseous but did not throw up.  He had some chest discomfort.  He has a history of GI bleed due to duodenal ulcers and he thought this was the same issue.  He does report last bowel movement 2 or 3 days ago and only consisting of small volume watery stool.  He denies any blood or dark black appearing stool.  He does take Eliquis regularly with last dose taken this morning.  ED Course  Labs/Imaging on admission: I have personally reviewed following labs and imaging studies.  Initial vitals showed BP 154/103, pulse 84, RR 18, temp 97.9 F, SpO2 100% on room air.  Labs show WBC 6.6, hemoglobin 13.6, platelets 163,000, sodium 137, potassium 4.5, bicarb 27, BUN 21, creatinine 1.40, serum glucose 106, LFTs within normal limits, lipase 25, lactic acid 1.2.  Urinalysis negative for UTI.  CT abdomen/pelvis shows large hiatal hernia with findings compatible with mesenteric axial volvulus of the stomach.  Stomach is dilated above and below the diaphragm.  Colonic diverticulosis and prostatomegaly noted.  No evidence for mesenteric ischemia or vascular pathology.  Patient was given 1 L LR, IV Zofran, IV morphine and fentanyl.  General surgery consulting, recommend NG tube placement, no indication for surgery at this time.  EDP spoke with on-call GI,  Dr. Lavon Paganini, who stated plan is for EGD in the morning.  The hospitalist service was consulted to admit for further evaluation and management.  Review of Systems: All systems reviewed and are negative except as documented in history of present illness above.   Past Medical History:  Diagnosis Date   Anginal pain (HCC)    NONE IN 3 YEARS   Arthritis    Atrial fibrillation (HCC) 1962   a. No recurrence since 1962.   Chest pain    a. 1998 Cath: nl cors;  b. 2009 Cath: nl cors.   Depression    Dysrhythmia    NO TROUBLE IN 1 YR    DR. PETER [place]    Esophageal reflux    Essential hypertension    History of Paroxysmal atrial flutter (HCC)    Hyperlipidemia    Incontinence of urine    Leg pain, bilateral    Nocturia    PONV (postoperative nausea and vomiting)    CONSTIPATED   Scarlet fever 1945   Spinal stenosis    Staph skin infection    LEFT GROIN  04/11/16  TX W/ DOXYCYCLINE   Ulcerative proctitis (HCC)     Past Surgical History:  Procedure Laterality Date   ANKLE RECONSTRUCTION Right 2007   [place]   ANTERIOR LAT LUMBAR FUSION Right 04/18/2016   Procedure: RIGHT LUMBAR THREE-FOUR, LUMBAR FOUR-FIVE ANTEROLATERAL LUMBAR INTERBODY FUSION;  Surgeon: Maeola Harman, MD;  Location: [facility] OR;  Service: Neurosurgery;  Laterality: Right;  RIGHT L3-4 L4-5 ANTEROLATERAL LUMBAR INTERBODY FUSION  BIOPSY  12/16/2020   Procedure: BIOPSY;  Surgeon: Kerin Salen, MD;  Location: [facility] ENDOSCOPY;  Service: Gastroenterology;;   CARDIAC CATHETERIZATION  03/03/2008   Left heart cardiac catheterization and coronary   CARDIAC CATHETERIZATION  1997   Dr Donnie Aho   CATARACT EXTRACTION W/ INTRAOCULAR LENS  IMPLANT, BILATERAL     2016   ESOPHAGEAL DILATION     2008   ESOPHAGOGASTRODUODENOSCOPY N/A 12/16/2020   Procedure: ESOPHAGOGASTRODUODENOSCOPY (EGD);  Surgeon: Kerin Salen, MD;  Location: [facility];  Service: Gastroenterology;  Laterality: N/A;   INNER EAR SURGERY     EAR INJECTION FOR MENIERES    KNEE ARTHROSCOPY  1991, 1995   LUMBAR LAMINECTOMY  12/2010   LUMBAR LAMINECTOMY/DECOMPRESSION MICRODISCECTOMY Left 08/15/2016   Procedure: Left Left three- four Redo laminectomy;  Surgeon: Maeola Harman, MD;  Location[facility];  Service: Neurosurgery;  Laterality: Left;  Left L3-4 Redo laminectomy   LUMBAR PERCUTANEOUS PEDICLE SCREW 2 LEVEL N/A 04/18/2016   Procedure: LUMBAR THREE-FOUR, LUMBAR FOUR-FIVE PERCUTANEOUS PEDICLE SCREW;  Surgeon: Maeola Harman, MD;  Location: [facility] OR;  Service: Neurosurgery;  Laterality: N/A;   TOTAL HIP ARTHROPLASTY Left 04/22/2017   TOTAL HIP ARTHROPLASTY Left 04/22/2017   Procedure: TOTAL HIP ARTHROPLASTY ANTERIOR APPROACH;  Surgeon: Marcene Corning, MD;  Location: [facility] OR;  Service: Orthopedics;  Laterality: Left;   TOTAL KNEE ARTHROPLASTY Right 11/21/2020   Procedure: RIGHT TOTAL KNEE ARTHROPLASTY;  Surgeon: Marcene Corning, MD;  Location: [facility] ORS;  Service: Orthopedics;  Laterality: Right;    Social History:  reports that he has been smoking pipe. He has never used smokeless tobacco. He reports current alcohol use of about 1.0 standard drink of alcohol per week. He reports that he does not use drugs.  Allergies  Allergen Reactions   Compazine [Prochlorperazine Edisylate] Other (See Comments)    EXTRAPYRAMIDAL MOVEMENT [Involuntary muscle movement]   Penicillins Nausea And Vomiting, Rash and Other (See Comments)    Has patient had a PCN reaction causing immediate rash, facial/tongue/throat swelling, SOB or lightheadedness with hypotension: #  #  #  YES  #  #  #  Has patient had a PCN reaction causing severe rash involving mucus membranes or skin necrosis: No Has patient had a PCN reaction that required hospitalization No Has patient had a PCN reaction occurring within the last 10 years: No If all of the above answers are "NO", then may proceed with Cephalosporin use.    Welchol [Colesevelam Hcl] Other (See Comments)    MYALGIAS [Leg aches]   Gemfibrozil Nausea Only     PATIENT TOLERATES   Red Yeast Rice [Cholestin] Other (See Comments)    flushing    Family History  Problem Relation Age of Onset   Prostate cancer Father    Heart attack Brother    Kidney cancer Brother    Brain cancer Brother      Prior to Admission medications   Medication Sig Start Date End Date Taking? Authorizing Provider  acetaminophen (TYLENOL) 325 MG tablet Take 2 tablets (650 mg total) by mouth every 6 (six) hours as needed for mild pain (or Fever >/= 101). 12/18/20   Lonia Blood, MD  carvedilol (COREG) 3.125 MG tablet Take 3.125 mg by mouth at bedtime.     [provider]  clonazePAM (KLONOPIN) 0.5 MG tablet take 1/2-1 TABLET BY MOUTH EVERYDAY AT BEDTIME AS NEEDED 02/06/22   Glean Salvo, NP  DULoxetine (CYMBALTA) 60 MG capsule Take 1 capsule (60 mg total) by mouth daily.  09/18/22   Glean Salvo, NP  ELIQUIS 5 MG TABS tablet Take 1 tablet (5 mg total) by mouth 2 (two) times daily. 07/15/22   [place], Peter M, MD  ferrous sulfate 324 (65 Fe) MG TBEC Take 324 mg by mouth daily.    [provider]  Multiple Vitamins-Minerals (MULTIVITAMIN PO) Take 1 tablet by mouth daily.    [provider]  nitroGLYCERIN (NITROSTAT) 0.4 MG SL tablet Place 0.4 mg under the tongue every 5 (five) minutes as needed for chest pain.    [provider]  pantoprazole (PROTONIX) 40 MG tablet Take 1 tablet (40 mg total) by mouth 2 (two) times daily before a meal. Patient taking differently: Take 40 mg by mouth 2 (two) times daily before a meal. Take 1 tablet by mouth twice a day 4d/week alternating with 1 tablet once a day 3d/week 12/18/20   Lonia Blood, MD  polyethylene glycol (MIRALAX / Ethelene Hal) packet Take 17 g by mouth daily as needed for mild constipation.    [provider]  pregabalin (LYRICA) 100 MG capsule TAKE FOUR CAPSULES BY MOUTH AT bedtime 09/02/22   Glean Salvo, NP  rOPINIRole (REQUIP) 0.5 MG tablet Take 1 tablet at 6 PM, take 1  tablet at bedtime 09/18/22   Glean Salvo, NP  rosuvastatin (CRESTOR) 10 MG tablet TAKE 1 TABLET ONCE DAILY OR AS DIRECTED. 04/22/17   [place], Peter M, MD  tamsulosin (FLOMAX) 0.4 MG CAPS capsule Take 0.4 mg by mouth daily. 12/08/18   [provider]  traMADol (ULTRAM) 50 MG tablet Take 1 tablet (50 mg total) by mouth every 6 (six) hours as needed for moderate pain. 12/18/20   Lonia Blood, MD    Physical Exam: Vitals:   10/14/22 1715 10/14/22 1745 10/14/22 1800 10/14/22 1830  BP: (!) 151/102 (!) 162/108 (!) 150/79 (!) 137/102  Pulse: (!) 118 95 91 88  Resp: 17 17 17 12   Temp:   97.9 F (36.6 C)   TempSrc:   Oral   SpO2: 99% 99% 96% 95%   Constitutional: Resting in bed, somewhat uncomfortable but otherwise in no acute distress, calm, answering questions appropriately Eyes: EOMI, lids and conjunctivae normal ENMT: NG tube in place, dark black liquid output collected.  Mucous membranes are dry.  Neck: normal, supple, no masses. Respiratory: clear to auscultation bilaterally, no wheezing, no crackles. Normal respiratory effort. No accessory muscle use.  Cardiovascular: Irregularly irregular, no murmurs / rubs / gallops.  Trace lower extremity edema. 2+ pedal pulses. Abdomen: Upper abdominal tenderness to palpation, no masses palpated.  Musculoskeletal: no clubbing / cyanosis. No joint deformity upper and lower extremities. Good ROM, no contractures. Normal muscle tone.  Skin: no rashes, lesions, ulcers. No induration Neurologic: Sensation intact. Strength 5/5 in all 4.  Psychiatric: Normal judgment and insight. Alert and oriented x 3. Normal mood.   EKG: Personally reviewed. Atrial fibrillation, rate 75, low voltage, no acute ischemic changes.  Similar to prior.  Assessment/Plan Principal Problem:   Mesenteroaxial gastric volvulus Active Problems:   Persistent atrial fibrillation (HCC)   Hypertension   Essential hypertension   Large hiatal hernia   MELQUISEDEC KROMAH is a  82 y.o. 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  General surgery and GI consulted.  NG tube  placed and plan for EGD in the morning.  Assessment and Plan: Large hiatal hernia with mesoaxial volvulus: CT imaging concerning for mesenteric axial volvulus of the stomach.  Seen by general surgery, no need for emergent intervention at this time further evaluation.  Lactic acid is reassuring.  Dark black liquid output noted from NG to. -Keep n.p.o. -NG tube in place to LIWS -General surgery following -EDP discussed with GI, Dr. Lavon Paganini, plan is for endoscopy tomorrow -Hold Eliquis -Start on IV Protonix 40 mg BID -IV fluid hydration overnight -IV analgesics and antiemetics as needed  Permanent atrial fibrillation: Currently rate controlled.  Holding Eliquis as above.  Home rec on hold while NPO.  IV metoprolol as needed for sustained HR >120.  History of GI bleed due to duodenal ulcer: Dark black liquid output noted from NG tube.  Holding Eliquis.  Placed on IV PPI twice daily.  CKD stage IIIa: Renal showed stable.  Continue to monitor.  Hypertension: BP stable.  Oral antihypertensives on hold while NPO.  Use IV hydralazine as needed.  Hyperlipidemia: Rosuvastatin on hold while NPO.  Peripheral neuropathy/restless leg syndrome: Cymbalta, Lyrica, Requip, and Klonopin on hold while NPO.   DVT prophylaxis: SCDs Start: 10/14/22 1927 Code Status: Full code, confirmed with patient on admission Family Communication: Daughter at bedside Disposition Plan: From home, dispo pending clinical progress Consults called: General surgery and GI Severity of Illness: The appropriate patient status for this patient is INPATIENT. Inpatient status is judged to be reasonable and  necessary in order to provide the required intensity of service to ensure the patient's safety. [name] presenting symptoms, physical exam findings, and initial radiographic and laboratory data in the context of their chronic comorbidities is felt to place them at high risk for further clinical deterioration. Furthermore, it is not anticipated that the patient will be medically stable for discharge from the hospital within 2 midnights of admission.   * I certify that at the point of admission it is my clinical judgment that the patient will require inpatient hospital care spanning beyond 2 midnights from the point of admission due to high intensity of service, high risk for further deterioration and high frequency of surveillance required.Darreld Mclean MD [facility]  If 7PM-7AM, please contact night-coverage [email or web address]  10/14/2022, 8:02 PM

## 2022-10-15 ENCOUNTER — Encounter (HOSPITAL_COMMUNITY)
Admission: EM | Disposition: A | Discharge status: Home / Self Care | Payer: Self-pay | Source: Home / Self Care | Attending: Internal Medicine

## 2022-10-15 ENCOUNTER — Inpatient Hospital Stay (HOSPITAL_COMMUNITY): Payer: Medicare Other | Admitting: Anesthesiology

## 2022-10-15 ENCOUNTER — Encounter (HOSPITAL_COMMUNITY): Payer: Self-pay | Admitting: Internal Medicine

## 2022-10-15 DIAGNOSIS — D649 Anemia, unspecified: Secondary | ICD-10-CM

## 2022-10-15 DIAGNOSIS — I10 Essential (primary) hypertension: Secondary | ICD-10-CM

## 2022-10-15 DIAGNOSIS — R935 Abnormal findings on diagnostic imaging of other abdominal regions, including retroperitoneum: Secondary | ICD-10-CM

## 2022-10-15 DIAGNOSIS — K3189 Other diseases of stomach and duodenum: Secondary | ICD-10-CM | POA: Diagnosis not present

## 2022-10-15 DIAGNOSIS — K219 Gastro-esophageal reflux disease without esophagitis: Secondary | ICD-10-CM

## 2022-10-15 DIAGNOSIS — K562 Volvulus: Secondary | ICD-10-CM

## 2022-10-15 DIAGNOSIS — Z7901 Long term (current) use of anticoagulants: Secondary | ICD-10-CM | POA: Diagnosis not present

## 2022-10-15 DIAGNOSIS — K449 Diaphragmatic hernia without obstruction or gangrene: Secondary | ICD-10-CM | POA: Diagnosis not present

## 2022-10-15 DIAGNOSIS — F17210 Nicotine dependence, cigarettes, uncomplicated: Secondary | ICD-10-CM

## 2022-10-15 DIAGNOSIS — I482 Chronic atrial fibrillation, unspecified: Secondary | ICD-10-CM

## 2022-10-15 DIAGNOSIS — R079 Chest pain, unspecified: Secondary | ICD-10-CM

## 2022-10-15 DIAGNOSIS — I4891 Unspecified atrial fibrillation: Secondary | ICD-10-CM

## 2022-10-15 HISTORY — PX: ESOPHAGOGASTRODUODENOSCOPY (EGD) WITH PROPOFOL: SHX5813

## 2022-10-15 LAB — CBC
HCT: 38.4 % — ABNORMAL LOW (ref 39.0–52.0)
Hemoglobin: 12.3 g/dL — ABNORMAL LOW (ref 13.0–17.0)
MCH: 33 pg (ref 26.0–34.0)
MCHC: 32 g/dL (ref 30.0–36.0)
MCV: 102.9 fL — ABNORMAL HIGH (ref 80.0–100.0)
Platelets: 152 10*3/uL (ref 150–400)
RBC: 3.73 MIL/uL — ABNORMAL LOW (ref 4.22–5.81)
RDW: 14.7 % (ref 11.5–15.5)
WBC: 7.4 10*3/uL (ref 4.0–10.5)
nRBC: 0 % (ref 0.0–0.2)

## 2022-10-15 LAB — BASIC METABOLIC PANEL
Anion gap: 5 (ref 5–15)
BUN: 18 mg/dL (ref 8–23)
CO2: 29 mmol/L (ref 22–32)
Calcium: 8.4 mg/dL — ABNORMAL LOW (ref 8.9–10.3)
Chloride: 106 mmol/L (ref 98–111)
Creatinine, Ser: 1.4 mg/dL — ABNORMAL HIGH (ref 0.61–1.24)
GFR, Estimated: 50 mL/min — ABNORMAL LOW (ref >60)
Glucose, Bld: 97 mg/dL (ref 70–99)
Potassium: 4.2 mmol/L (ref 3.5–5.1)
Sodium: 140 mmol/L (ref 135–145)

## 2022-10-15 LAB — VITAMIN B12: Vitamin B-12: 454 pg/mL (ref 180–914)

## 2022-10-15 LAB — FOLATE: Folate: 31.9 ng/mL (ref >5.9)

## 2022-10-15 SURGERY — ESOPHAGOGASTRODUODENOSCOPY (EGD) WITH PROPOFOL
Anesthesia: Monitor Anesthesia Care

## 2022-10-15 MED ORDER — PROPOFOL 500 MG/50ML IV EMUL
INTRAVENOUS | Status: DC | PRN
  Administered 2022-10-15: 150 ug/kg/min via INTRAVENOUS

## 2022-10-15 MED ORDER — LACTATED RINGERS IV SOLN: INTRAVENOUS | Status: AC

## 2022-10-15 MED ORDER — PROPOFOL 10 MG/ML IV BOLUS
INTRAVENOUS | Status: DC | PRN
  Administered 2022-10-15: 30 mg via INTRAVENOUS

## 2022-10-15 MED ORDER — ACETAMINOPHEN 325 MG PO TABS
650.0000 mg | ORAL_TABLET | Freq: Four times a day (QID) | ORAL | Status: DC | PRN
  Administered 2022-10-15 – 2022-10-16 (×2): 650 mg via ORAL
  Filled 2022-10-15 (×2): qty 2

## 2022-10-15 MED ORDER — SODIUM CHLORIDE 0.9 % IV SOLN: INTRAVENOUS | Status: DC

## 2022-10-15 MED ORDER — LIDOCAINE 2% (20 MG/ML) 5 ML SYRINGE
INTRAMUSCULAR | Status: DC | PRN
  Administered 2022-10-15: 100 mg via INTRAVENOUS

## 2022-10-15 SURGICAL SUPPLY — 15 items

## 2022-10-15 NOTE — Anesthesia Procedure Notes (Signed)
Procedure Name: General with mask airway Date/Time: 10/15/2022 10:46 AM  Performed by: Katina Degree, CRNAPre-anesthesia Checklist: Patient identified, Emergency Drugs available, Suction available and Patient being monitored Patient Re-evaluated:Patient Re-evaluated prior to induction Oxygen Delivery Method: Nasal cannula Preoxygenation: Pre-oxygenation with 100% oxygen Induction Type: IV induction Airway Equipment and Method: Bite block Dental Injury: Teeth and Oropharynx as per pre-operative assessment

## 2022-10-15 NOTE — Anesthesia Postprocedure Evaluation (Signed)
Anesthesia Post Note  Patient: [name]  Procedure(s) Performed: ESOPHAGOGASTRODUODENOSCOPY (EGD) WITH PROPOFOL     Patient location during evaluation: PACU Anesthesia Type: MAC Level of consciousness: awake and alert Pain management: pain level controlled Vital Signs Assessment: post-procedure vital signs reviewed and stable Respiratory status: spontaneous breathing, nonlabored ventilation and respiratory function stable Cardiovascular status: blood pressure returned to baseline Postop Assessment: no apparent nausea or vomiting Anesthetic complications: no   No notable events documented.      Shanda Howells

## 2022-10-15 NOTE — Op Note (Signed)
[facility] Patient Name: [name] Procedure Date : 10/15/2022 MRN: [id number] Attending MD: Starr Lake. Myrtie Neither , MD, [id number] Date of Birth: [birth year] CSN: [id number] Age: 82 Admit Type: Inpatient Procedure:                Upper GI endoscopy Indications:              Abnormal CT of the GI tract, Mesentero-axial                            gastric volvulus Providers:                Sherilyn Cooter L. Myrtie Neither, MD, Carlena Hurl, Rozetta Nunnery, Technician Referring MD:             [facility] Medicines:                Monitored Anesthesia Care Complications:            No immediate complications. Estimated Blood Loss:     Estimated blood loss: none. Procedure:                Pre-Anesthesia Assessment:                           - Prior to the procedure, a History and Physical                            was performed, and patient medications and                            allergies were reviewed. [name] tolerance of                            previous anesthesia was also reviewed. The risks                            and benefits of the procedure and the sedation                            options and risks were discussed with the patient.                            All questions were answered, and informed consent                            was obtained. Prior Anticoagulants: The patient has                            taken Eliquis (apixaban), last dose was 1 day prior                            to procedure. ASA Grade Assessment: III - A patient  with severe systemic disease. After reviewing the                            risks and benefits, the patient was deemed in                            satisfactory condition to undergo the procedure.                           After obtaining informed consent, the endoscope was                            passed under direct vision. Throughout the                             procedure, the patient's blood pressure, pulse, and                            oxygen saturations were monitored continuously. The                            [id number]-[id number] ([id number]) Olympus endoscope was introduced                            through the mouth, and advanced to the duodenal                            bulb. The upper GI endoscopy was accomplished                            without difficulty. The patient tolerated the                            procedure well. Scope In: Scope Out: Findings:      The esophagus was normal except for abrasion type mild NG tube trauma      A large hiatal hernia was present. The majority of the stomach is       intrathoracic.      Mucosal changes characterized by NG tube suction trauma were found in       the gastric fundus and proximal gastric body (the herniated portion of       the stomach). When the scope was first inserted into the stomach, it was       clear there was a large hiatal hernia and the distal gastric lumen was       initially difficult to identify. With insufflation, the stomach was very       well-visualized and the scope passed easily into the distal stomach and       then to the duodenal bulb. It could not be determined with certainty       based on this appearance whether or not a volvulus was still present.       Gentle scope torsion was used in an attempt to reduce any torsion that       may still be present, however this did not change the appearance of the  gastric lumen. The scope was easily retroflexed in the gastric antrum.      The NG tube was inadvertently dislodged with scope movements, causing       the tip of it to be pulled back into the esophagus. The NG tube was then       completely removed during the endoscopy.      The duodenal bulb was normal. Impression:               - Normal esophagus.                           - Large hiatal hernia.                           - NG tube suction trauma mucosa in the  gastric                            fundus.                           - Normal duodenal bulb.                           - No specimens collected. Recommendation:           - Return patient to hospital ward for ongoing care.                           - NPO.                           - Results will be conveyed to patient and family                            (daughter) and a call placed to the surgical                            service for an update on endoscopic findings and to                            request further surgical management of this problem. Procedure Code(s):        --- Professional ---                           [id number], Esophagogastroduodenoscopy, flexible,                            transoral; diagnostic, including collection of                            specimen(s) by brushing or washing, when performed                            (separate procedure) Diagnosis Code(s):        --- Professional ---  K44.9, Diaphragmatic hernia without obstruction or                            gangrene                           R93.3, Abnormal findings on diagnostic imaging of                            other parts of digestive tract CPT copyright 2022 [employer]. All rights reserved. The codes documented in this report are preliminary and upon coder review may  be revised to meet current compliance requirements. Marikay Roads L. Myrtie Neither, MD 10/15/2022 10:59:01 AM This report has been signed electronically. Number of Addenda: 0

## 2022-10-15 NOTE — Transfer of Care (Signed)
Immediate Anesthesia Transfer of Care Note  Patient: [name]  Procedure(s) Performed: ESOPHAGOGASTRODUODENOSCOPY (EGD) WITH PROPOFOL  Patient Location: Short Stay  Anesthesia Type:MAC  Level of Consciousness: awake  Airway & Oxygen Therapy: Patient Spontanous Breathing  Post-op Assessment: Report given to RN and Post -op Vital signs reviewed and stable  Post vital signs: Reviewed and stable  Last Vitals:  Vitals Value Taken Time  BP 99/68 10/15/22 1057  Temp    Pulse 85 10/15/22 1058  Resp 11 10/15/22 1058  SpO2 97 % 10/15/22 1058  Vitals shown include unvalidated device data.  Last Pain:  Vitals:   10/15/22 0942  TempSrc: Temporal  PainSc: 3          Complications: No notable events documented.

## 2022-10-15 NOTE — H&P (View-Only) (Signed)
Consultation Note   Referring Provider:  [facility] PCP: [name], MD Primary Gastroenterologist: Enid Baas ( [facility]). We are seeing as emergent consult        Reason for consultation: gastric volvulus  DOA: 10/14/2022         Hospital Day: 2   Assessment and Plan   82 yo male admitted with chest pain / retching and CT findings of a large hiatal hernia with gastric volvulus.  -Will need EGD this am for decompression.  The risks and benefits of EGD with possible biopsies were discussed with the patient who agrees to proceed.  -NGT already in place  GERD, symptoms controlled on PPI  Atrial fibrillation, on Eliquis -last dose was 5/13 at 8 am  Macrocytosis without anemia.  Hgb previously normal. It is low at 12.3 today but after IVF  History of non-H.pylori related PUD in 2022   History of Present Illness Patient is a 82 y.o. year old male whose past medical history includes but is not necessarily limited to GERD, large hiatal hernia, PUD, Atrial fibrillation on Eliquis, enlarged prostate, HTN, HLD, CKD3 and diverticulosis.   Drue began having chest pain, shob and retching yesterday.  Symptoms were of sudden onset. Prior to onset he had been feeling okay. He has a history of GERD but has been asymptomatic on PPI. In fact, PCP told him he could reduce dose.   ED Evaluation:   BUN 22, Cr 1.3, liver chemistries and lipase normal.  WBC 6.6, hgb 13.6, MCV 102.7. CTA of chest / abd / pelvis showed a large hiatal hernia with volvulus.     Imaging and Labs Recent Labs    10/14/22 1350 10/15/22 0257  WBC 6.6 7.4  HGB 13.6 12.3*  HCT 42.3 38.4*  PLT 163 152   Recent Labs    10/14/22 1350  NA 137  K 4.5  CL 102  CO2 27  GLUCOSE 106*  BUN 21  CREATININE 1.40*  CALCIUM 8.8*   Recent Labs    10/14/22 1350  PROT 6.6  ALBUMIN 4.1  AST 37  ALT 27  ALKPHOS 57  BILITOT 1.0   No results for input(s):  "HEPBSAG", "HCVAB", "HEPAIGM", "HEPBIGM" in the last 72 hours. No results for input(s): "LABPROT", "INR" in the last 72 hours.  Previous GI Evaluation:   *Apparently has had colonoscopies by [facility]   EGD July 2022 for melena - Normal esophagus. - Z-line regular, 35 cm from the incisors. - Large hiatal hernia. - Non-bleeding duodenal ulcers with a clean ulcer base (Forrest Class III). Biopsied.  FINAL MICROSCOPIC DIAGNOSIS:   A. DUODENAL ULCER, BIOPSY:  - Ulcer with necrosis.  - No malignancy identified.   B. ANTRUM, BIOPSY:  - Antral mucosa with hyperemia.  - Warthin-Starry negative for Helicobacter pylori.  - No intestinal metaplasia, dysplasia or carcinoma.    CTA chest /abdomen and pelvis 10/14/22 IMPRESSION: 1. No evidence for mesenteric ischemia. 2. No acute vascular pathology.   Aortic Atherosclerosis (ICD10-I70.0).   NON-VASCULAR  1. 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 The stomach is dilated above and below the diaphragm. 2. Colonic diverticulosis. 3. Prostatomegaly.   Electronically Signed: By: Linton Rump  Malena Edman M.D. On: 10/14/2022 16:13   Principal Problem:   Mesenteroaxial gastric volvulus Active Problems:   Hypertension   Essential hypertension   Persistent atrial fibrillation (HCC)   Large hiatal hernia  Past Medical History:  Diagnosis Date   Anginal pain (HCC)    NONE IN 3 YEARS   Arthritis    Atrial fibrillation (HCC) 1962   a. No recurrence since 1962.   Chest pain    a. 1998 Cath: nl cors;  b. 2009 Cath: nl cors.   Depression    Dysrhythmia    NO TROUBLE IN 1 YR    DR. PETER [place]    Esophageal reflux    Essential hypertension    History of Paroxysmal atrial flutter (HCC)    Hyperlipidemia    Incontinence of urine    Leg pain, bilateral    Nocturia    PONV (postoperative nausea and vomiting)    CONSTIPATED   Scarlet fever 1945   Spinal stenosis    Staph skin infection    LEFT  GROIN  04/11/16  TX W/ DOXYCYCLINE   Ulcerative proctitis (HCC)     Past Surgical History:  Procedure Laterality Date   ANKLE RECONSTRUCTION Right 2007   [place]   ANTERIOR LAT LUMBAR FUSION Right 04/18/2016   Procedure: RIGHT LUMBAR THREE-FOUR, LUMBAR FOUR-FIVE ANTEROLATERAL LUMBAR INTERBODY FUSION;  Surgeon: Maeola Harman, MD;  Location: [facility] OR;  Service: Neurosurgery;  Laterality: Right;  RIGHT L3-4 L4-5 ANTEROLATERAL LUMBAR INTERBODY FUSION   BIOPSY  12/16/2020   Procedure: BIOPSY;  Surgeon: Kerin Salen, MD;  Location: [facility];  Service: Gastroenterology;;   CARDIAC CATHETERIZATION  03/03/2008   Left heart cardiac catheterization and coronary   CARDIAC CATHETERIZATION  1997   Dr Donnie Aho   CATARACT EXTRACTION W/ INTRAOCULAR LENS  IMPLANT, BILATERAL     2016   ESOPHAGEAL DILATION     2008   ESOPHAGOGASTRODUODENOSCOPY N/A 12/16/2020   Procedure: ESOPHAGOGASTRODUODENOSCOPY (EGD);  Surgeon: Kerin Salen, MD;  Location: [facility];  Service: Gastroenterology;  Laterality: N/A;   INNER EAR SURGERY     EAR INJECTION FOR MENIERES   KNEE ARTHROSCOPY  1991, 1995   LUMBAR LAMINECTOMY  12/2010   LUMBAR LAMINECTOMY/DECOMPRESSION MICRODISCECTOMY Left 08/15/2016   Procedure: Left Left three- four Redo laminectomy;  Surgeon: Maeola Harman, MD;  Location: [facility];  Service: Neurosurgery;  Laterality: Left;  Left L3-4 Redo laminectomy   LUMBAR PERCUTANEOUS PEDICLE SCREW 2 LEVEL N/A 04/18/2016   Procedure: LUMBAR THREE-FOUR, LUMBAR FOUR-FIVE PERCUTANEOUS PEDICLE SCREW;  Surgeon: Maeola Harman, MD;  Location: [facility] OR;  Service: Neurosurgery;  Laterality: N/A;   TOTAL HIP ARTHROPLASTY Left 04/22/2017   TOTAL HIP ARTHROPLASTY Left 04/22/2017   Procedure: TOTAL HIP ARTHROPLASTY ANTERIOR APPROACH;  Surgeon: Marcene Corning, MD;  Location: [facility] OR;  Service: Orthopedics;  Laterality: Left;   TOTAL KNEE ARTHROPLASTY Right 11/21/2020   Procedure: RIGHT TOTAL KNEE ARTHROPLASTY;  Surgeon: Marcene Corning, MD;   Location: [facility];  Service: Orthopedics;  Laterality: Right;    Family History  Problem Relation Age of Onset   Prostate cancer Father    Heart attack Brother    Kidney cancer Brother    Brain cancer Brother     Prior to Admission medications   Medication Sig Start Date End Date Taking? Authorizing Provider  acetaminophen (TYLENOL) 325 MG tablet Take 2 tablets (650 mg total) by mouth every 6 (six) hours as needed for mild pain (or Fever >/= 101). 12/18/20   Lonia Blood, MD  carvedilol (COREG) 3.125 MG tablet Take 3.125 mg by mouth at bedtime.     [provider]  clonazePAM (KLONOPIN) 0.5 MG tablet take 1/2-1 TABLET BY MOUTH EVERYDAY AT BEDTIME AS NEEDED 02/06/22   Glean Salvo, NP  DULoxetine (CYMBALTA) 60 MG capsule Take 1 capsule (60 mg total) by mouth daily. 09/18/22   Glean Salvo, NP  ELIQUIS 5 MG TABS tablet Take 1 tablet (5 mg total) by mouth 2 (two) times daily. 07/15/22   [place], Peter M, MD  ferrous sulfate 324 (65 Fe) MG TBEC Take 324 mg by mouth daily.    [provider]  Multiple Vitamins-Minerals (MULTIVITAMIN PO) Take 1 tablet by mouth daily.    [provider]  nitroGLYCERIN (NITROSTAT) 0.4 MG SL tablet Place 0.4 mg under the tongue every 5 (five) minutes as needed for chest pain.    [provider]  pantoprazole (PROTONIX) 40 MG tablet Take 1 tablet (40 mg total) by mouth 2 (two) times daily before a meal. Patient taking differently: Take 40 mg by mouth 2 (two) times daily before a meal. Take 1 tablet by mouth twice a day 4d/week alternating with 1 tablet once a day 3d/week 12/18/20   Lonia Blood, MD  polyethylene glycol (MIRALAX / Ethelene Hal) packet Take 17 g by mouth daily as needed for mild constipation.    [provider]  pregabalin (LYRICA) 100 MG capsule TAKE FOUR CAPSULES BY MOUTH AT bedtime 09/02/22   Glean Salvo, NP  rOPINIRole (REQUIP) 0.5 MG tablet Take 1 tablet at 6 PM, take 1 tablet at bedtime 09/18/22    Glean Salvo, NP  rosuvastatin (CRESTOR) 10 MG tablet TAKE 1 TABLET ONCE DAILY OR AS DIRECTED. 04/22/17   [place], Peter M, MD  tamsulosin (FLOMAX) 0.4 MG CAPS capsule Take 0.4 mg by mouth daily. 12/08/18   [provider]  traMADol (ULTRAM) 50 MG tablet Take 1 tablet (50 mg total) by mouth every 6 (six) hours as needed for moderate pain. 12/18/20   Lonia Blood, MD    Current Facility-Administered Medications  Medication Dose Route Frequency Provider Last Rate Last Admin   acetaminophen (TYLENOL) suppository 650 mg  650 mg Rectal Q6H PRN Charlsie Quest, MD       hydrALAZINE (APRESOLINE) injection 10 mg  10 mg Intravenous Q6H PRN Darreld Mclean R, MD       HYDROmorphone (DILAUDID) injection 1 mg  1 mg Intravenous Q3H PRN Darreld Mclean R, MD   1 mg at 10/14/22 2307   metoprolol tartrate (LOPRESSOR) injection 5 mg  5 mg Intravenous Q6H PRN Charlsie Quest, MD       ondansetron (ZOFRAN) injection 4 mg  4 mg Intravenous Q6H PRN Charlsie Quest, MD       pantoprazole (PROTONIX) injection 40 mg  40 mg Intravenous Q12H Darreld Mclean R, MD   40 mg at 10/15/22 1610   sodium chloride flush (NS) 0.9 % injection 3 mL  3 mL Intravenous Q12H Darreld Mclean R, MD   3 mL at 10/14/22 2223    Allergies as of 10/14/2022 - Review Complete 10/14/2022  Allergen Reaction Noted   Compazine [prochlorperazine edisylate] Other (See Comments) 04/19/2014   Penicillins Nausea And Vomiting, Rash, and Other (See Comments) 07/05/2013   Welchol [colesevelam hcl] Other (See Comments) 10/19/2013   Gemfibrozil Nausea Only 07/05/2013   Red yeast rice [cholestin] Other (See Comments) 08/06/2016    Social History   Socioeconomic History  Marital status: Widowed    Spouse name: Darel Hong   Number of children: 3   Years of education: college   Highest education level: Not on file  Occupational History    Comment: Retired   Tobacco Use   Smoking status: Some Days    Types: Pipe   Smokeless tobacco: Never   Vaping Use   Vaping Use: Never used  Substance and Sexual Activity   Alcohol use: Yes    Alcohol/week: 1.0 standard drink of alcohol    Types: 1 Shots of liquor per week    Comment: occasionally   Drug use: No   Sexual activity: Not on file  Other Topics Concern   Not on file  Social History Narrative   Patient is retired and lives at home with his wife Darel Hong.    Education college.   Caffeine one cup daily.   Social Determinants of Health   Financial Resource Strain: Low Risk  (10/14/2022)   Overall Financial Resource Strain (CARDIA)    Difficulty of Paying Living Expenses: Not hard at all  Food Insecurity: No Food Insecurity (10/14/2022)   Hunger Vital Sign    Worried About Running Out of Food in the Last Year: Never true    Ran Out of Food in the Last Year: Never true  Transportation Needs: No Transportation Needs (10/14/2022)   PRAPARE - Administrator, Civil Service (Medical): No    Lack of Transportation (Non-Medical): No  Physical Activity: Sufficiently Active (10/14/2022)   Exercise Vital Sign    Days of Exercise per Week: 5 days    Minutes of Exercise per Session: 30 min  Stress: No Stress Concern Present (10/14/2022)   [employer] of Occupational Health - Occupational Stress Questionnaire    Feeling of Stress : Not at all  Social Connections: Moderately Integrated (10/14/2022)   Social Connection and Isolation Panel [NHANES]    Frequency of Communication with Friends and Family: Twice a week    Frequency of Social Gatherings with Friends and Family: Once a week    Attends Religious Services: More than 4 times per year    Active Member of [employer] or Organizations: No    Attends Banker Meetings: Never    Marital Status: Living with partner  Intimate Partner Violence: Not At Risk (10/14/2022)   Humiliation, Afraid, Rape, and Kick questionnaire    Fear of Current or Ex-Partner: No    Emotionally Abused: No    Physically Abused: No     Sexually Abused: No     Code Status   Code Status: Full Code  Review of Systems: All systems reviewed and negative except where noted in HPI.  Physical Exam: Vital signs in last 24 hours: Temp:  [97.9 F (36.6 C)-98.6 F (37 C)] 98.6 F (37 C) (05/14 0845) Pulse Rate:  [61-118] 82 (05/14 0845) Resp:  [10-23] 15 (05/14 0845) BP: (104-162)/(66-108) 110/76 (05/14 0845) SpO2:  [95 %-100 %] 100 % (05/14 0355) Weight:  [108.3 kg] 108.3 kg (05/13 2211) Last BM Date : 10/14/22  General:  Pleasant male in NAD.  Psych:  Cooperative. Normal mood and affect Eyes: Pupils equal Ears:  Normal auditory acuity Nose: No deformity, discharge or lesions Neck:  Supple, no masses felt Lungs:  Clear to auscultation.  Heart:  Regular rate.  Abdomen:  Soft, nondistended, nontender, active bowel sounds, no masses felt Rectal :  Deferred Msk: Symmetrical without gross deformities.  Neurologic:  Alert, oriented, grossly normal neurologically Extremities :  No edema Skin:  Intact without significant lesions.    Intake/Output from previous day: 05/13 0701 - 05/14 0700 In: 1448.3 [I.V.:418.3; NG/GT:30; IV Piggyback:1000] Out: 125 [Urine:125] Intake/Output this shift:  No intake/output data recorded.     Willette Cluster, NP-C @  10/15/2022, 8:50 AM   I have taken an interval history, thoroughly reviewed the chart and examined the patient. I agree with the Advanced Practitioner's note, impression and recommendations, and have recorded additional findings, impressions and recommendations below. I performed a substantive portion of this encounter (>50% time spent), including a complete performance of the medical decision making.   I also personally reviewed the CT images from this admission.  My additional thoughts are as follows:  Acute gastric volvulus in an 82 year old patient with a known large hiatal hernia.  Fortunately, no evidence of overt GI bleeding, sepsis or acidemia.  I saw him  with family at the bedside and discussed an urgent upper endoscopy this morning.  He was agreeable after discussion of procedure and risks.  The benefits and risks of the planned procedure were described in detail with the patient or (when appropriate) their health care proxy.  Risks were outlined as including, but not limited to, bleeding, infection, perforation, adverse medication reaction leading to cardiac or pulmonary decompensation, pancreatitis (if ERCP).  The limitation of incomplete mucosal visualization was also discussed.  No guarantees or warranties were given. Patient at increased risk for cardiopulmonary complications of procedure due to medical comorbidities.   General surgery saw this patient in consultation last evening.  After the endoscopy, I will communicate results both the patient &family and the surgical service because my opinion is that this patient needs definitive surgical repair of his hiatal hernia.  Even if this volvulus can be reduced endoscopically, there is a high risk of recurrence.  Charlie Pitter III [phone]

## 2022-10-15 NOTE — Consult Note (Addendum)
                                             Consultation Note   Referring Provider:  [facility] PCP: McNeill, Wendy, MD Primary Gastroenterologist: [facility] ( [facility]). We are seeing as emergent consult        Reason for consultation: gastric volvulus  DOA: 10/14/2022         Hospital Day: 2   Assessment and Plan   82 yo male admitted with chest pain / retching and CT findings of a large hiatal hernia with gastric volvulus.  -Will need EGD this am for decompression.  The risks and benefits of EGD with possible biopsies were discussed with the patient who agrees to proceed.  -NGT already in place  GERD, symptoms controlled on PPI  Atrial fibrillation, on Eliquis -last dose was 5/13 at 8 am  Macrocytosis without anemia.  Hgb previously normal. It is low at 12.3 today but after IVF  History of non-H.pylori related PUD in 2022   History of Present Illness Patient is a 82 y.o. year old male whose past medical history includes but is not necessarily limited to GERD, large hiatal hernia, PUD, Atrial fibrillation on Eliquis, enlarged prostate, HTN, HLD, CKD3 and diverticulosis.   Shyloh began having chest pain, shob and retching yesterday.  Symptoms were of sudden onset. Prior to onset he had been feeling okay. He has a history of GERD but has been asymptomatic on PPI. In fact, PCP told him he could reduce dose.   ED Evaluation:   BUN 22, Cr 1.3, liver chemistries and lipase normal.  WBC 6.6, hgb 13.6, MCV 102.7. CTA of chest / abd / pelvis showed a large hiatal hernia with volvulus.     Imaging and Labs Recent Labs    10/14/22 1350 10/15/22 0257  WBC 6.6 7.4  HGB 13.6 12.3*  HCT 42.3 38.4*  PLT 163 152   Recent Labs    10/14/22 1350  NA 137  K 4.5  CL 102  CO2 27  GLUCOSE 106*  BUN 21  CREATININE 1.40*  CALCIUM 8.8*   Recent Labs    10/14/22 1350  PROT 6.6  ALBUMIN 4.1  AST 37  ALT 27  ALKPHOS 57  BILITOT 1.0   No results for input(s):  "HEPBSAG", "HCVAB", "HEPAIGM", "HEPBIGM" in the last 72 hours. No results for input(s): "LABPROT", "INR" in the last 72 hours.  Previous GI Evaluation:   *Apparently has had colonoscopies by [facility]   EGD July 2022 for melena - Normal esophagus. - Z-line regular, 35 cm from the incisors. - Large hiatal hernia. - Non-bleeding duodenal ulcers with a clean ulcer base (Forrest Class III). Biopsied.  FINAL MICROSCOPIC DIAGNOSIS:   A. DUODENAL ULCER, BIOPSY:  - Ulcer with necrosis.  - No malignancy identified.   B. ANTRUM, BIOPSY:  - Antral mucosa with hyperemia.  - Warthin-Starry negative for Helicobacter pylori.  - No intestinal metaplasia, dysplasia or carcinoma.    CTA chest /abdomen and pelvis 10/14/22 IMPRESSION: 1. No evidence for mesenteric ischemia. 2. No acute vascular pathology.   Aortic Atherosclerosis (ICD10-I70.0).   NON-VASCULAR  1. 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 The stomach is dilated above and below the diaphragm. 2. Colonic diverticulosis. 3. Prostatomegaly.   Electronically Signed: By: Amy    Guttmann M.D. On: 10/14/2022 16:13   Principal Problem:   Mesenteroaxial gastric volvulus Active Problems:   Hypertension   Essential hypertension   Persistent atrial fibrillation (HCC)   Large hiatal hernia  Past Medical History:  Diagnosis Date   Anginal pain (HCC)    NONE IN 3 YEARS   Arthritis    Atrial fibrillation (HCC) 1962   a. No recurrence since 1962.   Chest pain    a. 1998 Cath: nl cors;  b. 2009 Cath: nl cors.   Depression    Dysrhythmia    NO TROUBLE IN 1 YR    DR. PETER JORDAN    Esophageal reflux    Essential hypertension    History of Paroxysmal atrial flutter (HCC)    Hyperlipidemia    Incontinence of urine    Leg pain, bilateral    Nocturia    PONV (postoperative nausea and vomiting)    CONSTIPATED   Scarlet fever 1945   Spinal stenosis    Staph skin infection    LEFT  GROIN  04/11/16  TX W/ DOXYCYCLINE   Ulcerative proctitis (HCC)     Past Surgical History:  Procedure Laterality Date   ANKLE RECONSTRUCTION Right 2007   [place]   ANTERIOR LAT LUMBAR FUSION Right 04/18/2016   Procedure: RIGHT LUMBAR THREE-FOUR, LUMBAR FOUR-FIVE ANTEROLATERAL LUMBAR INTERBODY FUSION;  Surgeon: Joseph Stern, MD;  Location: [facility];  Service: Neurosurgery;  Laterality: Right;  RIGHT L3-4 L4-5 ANTEROLATERAL LUMBAR INTERBODY FUSION   BIOPSY  12/16/2020   Procedure: BIOPSY;  Surgeon: Karki, Arya, MD;  Location: [facility];  Service: Gastroenterology;;   CARDIAC CATHETERIZATION  03/03/2008   Left heart cardiac catheterization and coronary   CARDIAC CATHETERIZATION  1997   Dr Tilley   CATARACT EXTRACTION W/ INTRAOCULAR LENS  IMPLANT, BILATERAL     2016   ESOPHAGEAL DILATION     2008   ESOPHAGOGASTRODUODENOSCOPY N/A 12/16/2020   Procedure: ESOPHAGOGASTRODUODENOSCOPY (EGD);  Surgeon: Karki, Arya, MD;  Location: [facility];  Service: Gastroenterology;  Laterality: N/A;   INNER EAR SURGERY     EAR INJECTION FOR MENIERES   KNEE ARTHROSCOPY  1991, 1995   LUMBAR LAMINECTOMY  12/2010   LUMBAR LAMINECTOMY/DECOMPRESSION MICRODISCECTOMY Left 08/15/2016   Procedure: Left Left three- four Redo laminectomy;  Surgeon: Joseph Stern, MD;  Location: [facility];  Service: Neurosurgery;  Laterality: Left;  Left L3-4 Redo laminectomy   LUMBAR PERCUTANEOUS PEDICLE SCREW 2 LEVEL N/A 04/18/2016   Procedure: LUMBAR THREE-FOUR, LUMBAR FOUR-FIVE PERCUTANEOUS PEDICLE SCREW;  Surgeon: Joseph Stern, MD;  Location: [facility];  Service: Neurosurgery;  Laterality: N/A;   TOTAL HIP ARTHROPLASTY Left 04/22/2017   TOTAL HIP ARTHROPLASTY Left 04/22/2017   Procedure: TOTAL HIP ARTHROPLASTY ANTERIOR APPROACH;  Surgeon: Dalldorf, Peter, MD;  Location: [facility];  Service: Orthopedics;  Laterality: Left;   TOTAL KNEE ARTHROPLASTY Right 11/21/2020   Procedure: RIGHT TOTAL KNEE ARTHROPLASTY;  Surgeon: Dalldorf, Peter, MD;   Location: [facility];  Service: Orthopedics;  Laterality: Right;    Family History  Problem Relation Age of Onset   Prostate cancer Father    Heart attack Brother    Kidney cancer Brother    Brain cancer Brother     Prior to Admission medications   Medication Sig Start Date End Date Taking? Authorizing Provider  acetaminophen (TYLENOL) 325 MG tablet Take 2 tablets (650 mg total) by mouth every 6 (six) hours as needed for mild pain (or Fever >/= 101). 12/18/20   McClung, Jeffrey T, MD    carvedilol (COREG) 3.125 MG tablet Take 3.125 mg by mouth at bedtime.     [provider]  clonazePAM (KLONOPIN) 0.5 MG tablet take 1/2-1 TABLET BY MOUTH EVERYDAY AT BEDTIME AS NEEDED 02/06/22   Slack, [name] J, NP  DULoxetine (CYMBALTA) 60 MG capsule Take 1 capsule (60 mg total) by mouth daily. 09/18/22   Slack, [name] J, NP  ELIQUIS 5 MG TABS tablet Take 1 tablet (5 mg total) by mouth 2 (two) times daily. 07/15/22   Jordan, Peter M, MD  ferrous sulfate 324 (65 Fe) MG TBEC Take 324 mg by mouth daily.    [provider]  Multiple Vitamins-Minerals (MULTIVITAMIN PO) Take 1 tablet by mouth daily.    [provider]  nitroGLYCERIN (NITROSTAT) 0.4 MG SL tablet Place 0.4 mg under the tongue every 5 (five) minutes as needed for chest pain.    [provider]  pantoprazole (PROTONIX) 40 MG tablet Take 1 tablet (40 mg total) by mouth 2 (two) times daily before a meal. Patient taking differently: Take 40 mg by mouth 2 (two) times daily before a meal. Take 1 tablet by mouth twice a day 4d/week alternating with 1 tablet once a day 3d/week 12/18/20   McClung, Jeffrey T, MD  polyethylene glycol (MIRALAX / GLYCOLAX) packet Take 17 g by mouth daily as needed for mild constipation.    [provider]  pregabalin (LYRICA) 100 MG capsule TAKE FOUR CAPSULES BY MOUTH AT bedtime 09/02/22   Slack, [name] J, NP  rOPINIRole (REQUIP) 0.5 MG tablet Take 1 tablet at 6 PM, take 1 tablet at bedtime 09/18/22    Slack, [name] J, NP  rosuvastatin (CRESTOR) 10 MG tablet TAKE 1 TABLET ONCE DAILY OR AS DIRECTED. 04/22/17   Jordan, Peter M, MD  tamsulosin (FLOMAX) 0.4 MG CAPS capsule Take 0.4 mg by mouth daily. 12/08/18   [provider]  traMADol (ULTRAM) 50 MG tablet Take 1 tablet (50 mg total) by mouth every 6 (six) hours as needed for moderate pain. 12/18/20   McClung, Jeffrey T, MD    Current Facility-Administered Medications  Medication Dose Route Frequency Provider Last Rate Last Admin   acetaminophen (TYLENOL) suppository 650 mg  650 mg Rectal Q6H PRN Patel, Vishal R, MD       hydrALAZINE (APRESOLINE) injection 10 mg  10 mg Intravenous Q6H PRN Patel, Vishal R, MD       HYDROmorphone (DILAUDID) injection 1 mg  1 mg Intravenous Q3H PRN Patel, Vishal R, MD   1 mg at 10/14/22 2307   metoprolol tartrate (LOPRESSOR) injection 5 mg  5 mg Intravenous Q6H PRN Patel, Vishal R, MD       ondansetron (ZOFRAN) injection 4 mg  4 mg Intravenous Q6H PRN Patel, Vishal R, MD       pantoprazole (PROTONIX) injection 40 mg  40 mg Intravenous Q12H Patel, Vishal R, MD   40 mg at 10/15/22 0824   sodium chloride flush (NS) 0.9 % injection 3 mL  3 mL Intravenous Q12H Patel, Vishal R, MD   3 mL at 10/14/22 2223    Allergies as of 10/14/2022 - Review Complete 10/14/2022  Allergen Reaction Noted   Compazine [prochlorperazine edisylate] Other (See Comments) 04/19/2014   Penicillins Nausea And Vomiting, Rash, and Other (See Comments) 07/05/2013   Welchol [colesevelam hcl] Other (See Comments) 10/19/2013   Gemfibrozil Nausea Only 07/05/2013   Red yeast rice [cholestin] Other (See Comments) 08/06/2016    Social History   Socioeconomic History     Marital status: Widowed    Spouse name: Judy   Number of children: 3   Years of education: college   Highest education level: Not on file  Occupational History    Comment: Retired   Tobacco Use   Smoking status: Some Days    Types: Pipe   Smokeless tobacco: Never   Vaping Use   Vaping Use: Never used  Substance and Sexual Activity   Alcohol use: Yes    Alcohol/week: 1.0 standard drink of alcohol    Types: 1 Shots of liquor per week    Comment: occasionally   Drug use: No   Sexual activity: Not on file  Other Topics Concern   Not on file  Social History Narrative   Patient is retired and lives at home with his wife Judy.    Education college.   Caffeine one cup daily.   Social Determinants of Health   Financial Resource Strain: Low Risk  (10/14/2022)   Overall Financial Resource Strain (CARDIA)    Difficulty of Paying Living Expenses: Not hard at all  Food Insecurity: No Food Insecurity (10/14/2022)   Hunger Vital Sign    Worried About Running Out of Food in the Last Year: Never true    Ran Out of Food in the Last Year: Never true  Transportation Needs: No Transportation Needs (10/14/2022)   PRAPARE - Transportation    Lack of Transportation (Medical): No    Lack of Transportation (Non-Medical): No  Physical Activity: Sufficiently Active (10/14/2022)   Exercise Vital Sign    Days of Exercise per Week: 5 days    Minutes of Exercise per Session: 30 min  Stress: No Stress Concern Present (10/14/2022)   [employer] - Occupational Stress Questionnaire    Feeling of Stress : Not at all  Social Connections: Moderately Integrated (10/14/2022)   Social Connection and Isolation Panel [NHANES]    Frequency of Communication with Friends and Family: Twice a week    Frequency of Social Gatherings with Friends and Family: Once a week    Attends Religious Services: More than 4 times per year    Active Member of Clubs or Organizations: No    Attends Club or Organization Meetings: Never    Marital Status: Living with partner  Intimate Partner Violence: Not At Risk (10/14/2022)   Humiliation, Afraid, Rape, and Kick questionnaire    Fear of Current or Ex-Partner: No    Emotionally Abused: No    Physically Abused: No     Sexually Abused: No     Code Status   Code Status: Full Code  Review of Systems: All systems reviewed and negative except where noted in HPI.  Physical Exam: Vital signs in last 24 hours: Temp:  [97.9 F (36.6 C)-98.6 F (37 C)] 98.6 F (37 C) (05/14 0845) Pulse Rate:  [61-118] 82 (05/14 0845) Resp:  [10-23] 15 (05/14 0845) BP: (104-162)/(66-108) 110/76 (05/14 0845) SpO2:  [95 %-100 %] 100 % (05/14 0355) Weight:  [108.3 kg] 108.3 kg (05/13 2211) Last BM Date : 10/14/22  General:  Pleasant male in NAD.  Psych:  Cooperative. Normal mood and affect Eyes: Pupils equal Ears:  Normal auditory acuity Nose: No deformity, discharge or lesions Neck:  Supple, no masses felt Lungs:  Clear to auscultation.  Heart:  Regular rate.  Abdomen:  Soft, nondistended, nontender, active bowel sounds, no masses felt Rectal :  Deferred Msk: Symmetrical without gross deformities.  Neurologic:  Alert, oriented, grossly normal neurologically Extremities :   No edema Skin:  Intact without significant lesions.    Intake/Output from previous day: 05/13 0701 - 05/14 0700 In: 1448.3 [I.V.:418.3; NG/GT:30; IV Piggyback:1000] Out: 125 [Urine:125] Intake/Output this shift:  No intake/output data recorded.     Paula Guenther, NP-C @  10/15/2022, 8:50 AM   I have taken an interval history, thoroughly reviewed the chart and examined the patient. I agree with the Advanced Practitioner's note, impression and recommendations, and have recorded additional findings, impressions and recommendations below. I performed a substantive portion of this encounter (>50% time spent), including a complete performance of the medical decision making.   I also personally reviewed the CT images from this admission.  My additional thoughts are as follows:  Acute gastric volvulus in an 82-year-old patient with a known large hiatal hernia.  Fortunately, no evidence of overt GI bleeding, sepsis or acidemia.  I saw him  with family at the bedside and discussed an urgent upper endoscopy this morning.  He was agreeable after discussion of procedure and risks.  The benefits and risks of the planned procedure were described in detail with the patient or (when appropriate) their health care proxy.  Risks were outlined as including, but not limited to, bleeding, infection, perforation, adverse medication reaction leading to cardiac or pulmonary decompensation, pancreatitis (if ERCP).  The limitation of incomplete mucosal visualization was also discussed.  No guarantees or warranties were given. Patient at increased risk for cardiopulmonary complications of procedure due to medical comorbidities.   General surgery saw this patient in consultation last evening.  After the endoscopy, I will communicate results both the patient &family and the surgical service because my opinion is that this patient needs definitive surgical repair of his hiatal hernia.  Even if this volvulus can be reduced endoscopically, there is a high risk of recurrence.  Staphany Ditton L Danis III Office:[phone]   

## 2022-10-15 NOTE — Interval H&P Note (Signed)
History and Physical Interval Note:  10/15/2022 10:26 AM  [name]  has presented today for surgery, with the diagnosis of Gastric vovlulus.  The various methods of treatment have been discussed with the patient and family. After consideration of risks, benefits and other options for treatment, the patient has consented to  Procedure(s): ESOPHAGOGASTRODUODENOSCOPY (EGD) WITH PROPOFOL (N/A) as a surgical intervention.  [name] history has been reviewed, patient examined, no change in status, stable for surgery.  I have reviewed the patient's chart and labs.  Questions were answered to the patient's satisfaction.     Charlie Pitter III

## 2022-10-15 NOTE — Progress Notes (Addendum)
[facility] Progress Note  Day of Surgery  Subjective: CC:  Recently back from endoscopy. Denies abdominal pain. Reports feeling less abdominal distention compared to when he came in. Denies nausea or vomiting. Asking for water.  Denies a history of abdominal surgery. Objective: Vital signs in last 24 hours: Temp:  [97.9 F (36.6 C)-98.6 F (37 C)] 98.6 F (37 C) (05/14 1208) Pulse Rate:  [50-118] 50 (05/14 1140) Resp:  [10-23] 12 (05/14 1140) BP: (104-162)/(66-108) 115/76 (05/14 1208) SpO2:  [91 %-100 %] 93 % (05/14 1208) Weight:  [108.3 kg] 108.3 kg (05/14 0942) Last BM Date : 10/14/22  Intake/Output from previous day: 05/13 0701 - 05/14 0700 In: 1448.3 [I.V.:418.3; NG/GT:30; IV Piggyback:1000] Out: 125 [Urine:125] Intake/Output this shift: Total I/O In: 1161.4 [P.O.:360; I.V.:801.4] Out: 300 [Urine:300]  PE: Gen:  Alert, NAD, pleasant Card:  irregular, regular rate Pulm:  Normal effort Abd: Soft, non-tender, nondistended, no hernias Skin: warm and dry, no rashes  Psych: A&Ox3   Lab Results:  Recent Labs    10/14/22 1350 10/15/22 0257  WBC 6.6 7.4  HGB 13.6 12.3*  HCT 42.3 38.4*  PLT 163 152   BMET Recent Labs    10/14/22 1350 10/15/22 0829  NA 137 140  K 4.5 4.2  CL 102 106  CO2 27 29  GLUCOSE 106* 97  BUN 21 18  CREATININE 1.40* 1.40*  CALCIUM 8.8* 8.4*   PT/INR No results for input(s): "LABPROT", "INR" in the last 72 hours. CMP     Component Value Date/Time   NA 140 10/15/2022 0829   NA 138 04/29/2016 0000   K 4.2 10/15/2022 0829   CL 106 10/15/2022 0829   CO2 29 10/15/2022 0829   GLUCOSE 97 10/15/2022 0829   BUN 18 10/15/2022 0829   BUN 19 04/29/2016 0000   CREATININE 1.40 (H) 10/15/2022 0829   CALCIUM 8.4 (L) 10/15/2022 0829   PROT 6.6 10/14/2022 1350   PROT 6.4 04/25/2021 0837   ALBUMIN 4.1 10/14/2022 1350   ALBUMIN 4.4 01/13/2017 0846   AST 37 10/14/2022 1350   ALT 27 10/14/2022 1350   ALKPHOS 57 10/14/2022 1350    BILITOT 1.0 10/14/2022 1350   BILITOT 0.4 01/13/2017 0846   GFRNONAA 50 (L) 10/15/2022 0829   GFRAA 59 (L) 04/14/2017 1000   Lipase     Component Value Date/Time   LIPASE 25 10/14/2022 1350       Studies/Results: CT ANGIO ABDOMEN PELVIS  W &/OR WO CONTRAST  Addendum Date: 10/14/2022   ADDENDUM REPORT: 10/14/2022 23:36 ADDENDUM: These results were called by telephone at the time of interpretation on 10/14/2022 at 4:17 p.m. to provider Dr. Earlene Plater, who verbally acknowledged these results. Electronically Signed   By: Darliss Cheney M.D.   On: 10/14/2022 23:36   Result Date: 10/14/2022 CLINICAL DATA:  Abdominal pain, evaluate for mesenteric ischemia. EXAM: CTA ABDOMEN AND PELVIS WITHOUT AND WITH CONTRAST TECHNIQUE: Multidetector CT imaging of the abdomen and pelvis was performed using the standard protocol during bolus administration of intravenous contrast. Multiplanar reconstructed images and MIPs were obtained and reviewed to evaluate the vascular anatomy. RADIATION DOSE REDUCTION: This exam was performed according to the departmental dose-optimization program which includes automated exposure control, adjustment of the mA and/or kV according to patient size and/or use of iterative reconstruction technique. CONTRAST:  80mL OMNIPAQUE IOHEXOL 350 MG/ML SOLN COMPARISON:  CT abdomen and pelvis 12/15/2020 FINDINGS: VASCULAR Aorta: Normal caliber aorta without aneurysm, dissection, vasculitis or significant stenosis. There is  mild calcified atherosclerotic disease throughout the aorta. Celiac: Patent without evidence of aneurysm, dissection, vasculitis or significant stenosis. SMA: Patent without evidence of aneurysm, dissection, vasculitis or significant stenosis. Renals: Both renal arteries are patent without evidence of aneurysm, dissection, vasculitis, fibromuscular dysplasia or significant stenosis. IMA: Patent without evidence of aneurysm, dissection, vasculitis or significant stenosis. Inflow:  Patent without evidence of aneurysm, dissection, vasculitis or significant stenosis. Proximal Outflow: Bilateral common femoral and visualized portions of the superficial and profunda femoral arteries are patent without evidence of aneurysm, dissection, vasculitis or significant stenosis. Veins: No obvious venous abnormality. Review of the MIP images confirms the above findings. NON-VASCULAR Lower chest: There is atelectasis in the left lower lobe. Hepatobiliary: No focal liver abnormality is seen. No gallstones, gallbladder wall thickening, or biliary dilatation. Pancreas: Unremarkable. No pancreatic ductal dilatation or surrounding inflammatory changes. Spleen: Normal in size without focal abnormality. Adrenals/Urinary Tract: Bilateral renal cysts are present measuring up to 3.3 cm there is no hydronephrosis. Adrenal glands bladder are within normal limits. Stomach/Bowel: Large hiatal hernia is present containing the proximal stomach. The fundus of the stomach is below the diaphragm of the gastric antrum is above the diaphragm compatible with mesentero-axial volvulus. The stomach is dilated inferior and superior to the diaphragm. Appendix appears normal. No evidence of bowel wall thickening or inflammation. There is diffuse colonic diverticulosis. Lymphatic: No enlarged lymph nodes are seen. Reproductive: Prostate gland is significantly enlarged. Other: There is a small fat containing umbilical hernia. No free fluid. Musculoskeletal: L3-L5 posterior fusion hardware is present. Left hip arthroplasty is present. IMPRESSION: 1. No evidence for mesenteric ischemia. 2. No acute vascular pathology. Aortic Atherosclerosis (ICD10-I70.0). NON-VASCULAR 1. 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 The stomach is dilated above and below the diaphragm. 2. Colonic diverticulosis. 3. Prostatomegaly. Electronically Signed: By: Darliss Cheney M.D. On: 10/14/2022 16:13   DG Abd  Portable 1 View  Result Date: 10/14/2022 CLINICAL DATA:  Check gastric catheter placement EXAM: PORTABLE ABDOMEN - 1 VIEW COMPARISON:  Film from 5 minutes previous FINDINGS: Gastric catheter is again noted with the tip in the stomach. It has withdrawn slightly in the interval from the prior exam although given the large hiatal hernia, the proximal side port likely is within the stomach. No other focal abnormality is noted. IMPRESSION: Slight withdrawal of gastric catheter as described. Electronically Signed   By: Alcide Clever M.D.   On: 10/14/2022 18:28   DG Abd Portable 1 View  Result Date: 10/14/2022 CLINICAL DATA:  Check gastric catheter placement EXAM: PORTABLE ABDOMEN - 1 VIEW COMPARISON:  CT from earlier in the same day. FINDINGS: Gastric catheter is noted within the stomach. Scattered bowel gas is noted without free air. IMPRESSION: Gastric catheter within the stomach. Electronically Signed   By: Alcide Clever M.D.   On: 10/14/2022 18:27   DG Chest Portable 1 View  Result Date: 10/14/2022 CLINICAL DATA:  Chest and abdominal pain. Shortness of breath and nausea. EXAM: PORTABLE CHEST 1 VIEW COMPARISON:  03/17/2022 FINDINGS: Shallow inspiration. Heart size and pulmonary vascularity are normal for technique. Fibrosis or linear atelectasis in the left base. Lungs are otherwise clear and expanded. No pleural effusions. No pneumothorax. Mediastinal contours appear intact. Large esophageal hiatal hernia behind the heart. Similar appearance to previous study. IMPRESSION: Linear fibrosis or atelectasis in the left base. Large esophageal hiatal hernia. No evidence of active pulmonary disease. Electronically Signed   By: Burman Nieves M.D.   On: 10/14/2022 15:24  Anti-infectives: Anti-infectives (From admission, onward)    None        Assessment/Plan Mesoaxial gastric volvulus S/p EGD 5/14 Dr. Myrtie Neither, NGT removed during this procedure   Hiatal Hernia  - patient clinically improved s/p EGD.  Start CLD and monitor tolerance  - if he does well with PO intake then would plan for hospital discharge with close follow up In our office to scheduled elective [facility] repair. I will ask Dr. Derrell Lolling to review this patients imaging.   LOS: 1 day   I reviewed nursing notes, Consultant GI notes, hospitalist notes, last 24 h vitals and pain scores, last 48 h intake and output, last 24 h labs and trends, and last 24 h imaging results.  This care required straight-forward level of medical decision making.   Hosie Spangle, PA-C [facility] Please see Amion for pager number during day hours 7:00am-4:30pm

## 2022-10-15 NOTE — Progress Notes (Signed)
[facility] PROGRESS NOTE   FERMON FRON ZOX:[id number] DOB: January 25, 1941 DOA: 10/14/2022  PCP: [name], MD  Brief History/Interval Summary: 82 y.o. male with medical history significant for permanent atrial fibrillation on Eliquis, HTN, CKD stage IIIa, HLD, history of GI bleed due to duodenal ulcer, peripheral neuropathy, RLS who presented to the ED for evaluation of abdominal pain.  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  Patient was hospitalized for further management.  Consultants: Gastroenterology.  General surgery  Procedures: EGD is planned for today    Subjective/Interval History: Patient mentions that his abdominal pain is better compared to last night.  Denies any chest pain shortness of breath.  His son is at the bedside.    Assessment/Plan:  Large hiatal hernia with gastric volvulus No emergent surgery per general surgery. Gastroenterology has been consulted.  Plan is for upper endoscopy today. NGT was placed overnight.  Symptomatically he is better compared to last night. Continue n.p.o. status. Anticoagulation is on hold. Continue PPI.  Pulmonary atrial fibrillation Followed by Dr. [place].  Rate is controlled.  Monitor on telemetry.  Eliquis is on hold.  IV metoprolol as needed. Prior to admission he was on carvedilol  History of GI bleed due to duodenal ulcer This was in 2022.    Chronic kidney disease stage IIIa Renal function has been stable.  Monitor urine output.  Avoid nephrotoxic agents. Labs are pending from this morning.  Essential hypertension Monitor blood pressures closely.  Oral medications currently on hold.  Dyslipidemia Statin on hold.  History of peripheral neuropathy/restless leg syndrome Noted to be on Cymbalta, Lyrica, Requip and Klonopin prior to admission.  These are currently on hold due to n.p.o. status.  Obesity Estimated body mass index is 34.25 kg/m as calculated from the following:   Height as of  09/18/22: 5\' 10"  (1.778 m).   Weight as of this encounter: 108.3 kg.  DVT Prophylaxis: SCDs Code Status: Full code Family Communication: Discussed with the patient and his son Disposition Plan: Hopefully return home when improved  Status is: Inpatient Remains inpatient appropriate because: Gastric volvulus      Medications: Scheduled:  pantoprazole (PROTONIX) IV  40 mg Intravenous Q12H   sodium chloride flush  3 mL Intravenous Q12H   Continuous: WJX:BJYNWGNFAOZHY, hydrALAZINE, HYDROmorphone (DILAUDID) injection, metoprolol tartrate, ondansetron (ZOFRAN) IV  Antibiotics: Anti-infectives (From admission, onward)    None       Objective:  Vital Signs  Vitals:   10/14/22 2300 10/14/22 2315 10/15/22 0355 10/15/22 0845  BP: 104/66 120/77 114/72 110/76  Pulse: 88 95 61 82  Resp: 10 11 18 15   Temp:  98.4 F (36.9 C) 98 F (36.7 C) 98.6 F (37 C)  TempSrc:  Oral Oral Oral  SpO2: 97% 100% 100%   Weight:        Intake/Output Summary (Last 24 hours) at 10/15/2022 0918 Last data filed at 10/15/2022 0000 Gross per 24 hour  Intake 1448.26 ml  Output 125 ml  Net 1323.26 ml   Filed Weights   10/14/22 2211  Weight: 108.3 kg    General appearance: Awake alert.  In no distress Resp: Clear to auscultation bilaterally.  Normal effort Cardio: S1-S2 is irregularly irregular GI: Abdomen is soft.  Mildly tender in the upper abdomen without any rebound rigidity or guarding.  No masses organomegaly. Extremities: No edema.  Moving all of his extremities Neurologic: Alert and oriented x3.  No focal neurological deficits.    Lab Results:  Data Reviewed:  I have personally reviewed following labs and reports of the imaging studies  CBC: Recent Labs  Lab 10/14/22 1350 10/15/22 0257  WBC 6.6 7.4  HGB 13.6 12.3*  HCT 42.3 38.4*  MCV 102.7* 102.9*  PLT 163 152    Basic Metabolic Panel: Recent Labs  Lab 10/14/22 1350  NA 137  K 4.5  CL 102  CO2 27  GLUCOSE 106*   BUN 21  CREATININE 1.40*  CALCIUM 8.8*    GFR: Estimated Creatinine Clearance: 48.4 mL/min (A) (by C-G formula based on SCr of 1.4 mg/dL (H)).  Liver Function Tests: Recent Labs  Lab 10/14/22 1350  AST 37  ALT 27  ALKPHOS 57  BILITOT 1.0  PROT 6.6  ALBUMIN 4.1    Recent Labs  Lab 10/14/22 1350  LIPASE 25     Radiology Studies: CT ANGIO ABDOMEN PELVIS  W &/OR WO CONTRAST  Addendum Date: 10/14/2022   ADDENDUM REPORT: 10/14/2022 23:36 ADDENDUM: These results were called by telephone at the time of interpretation on 10/14/2022 at 4:17 p.m. to provider Dr. Earlene Plater, who verbally acknowledged these results. Electronically Signed   By: Darliss Cheney M.D.   On: 10/14/2022 23:36   Result Date: 10/14/2022 CLINICAL DATA:  Abdominal pain, evaluate for mesenteric ischemia. EXAM: CTA ABDOMEN AND PELVIS WITHOUT AND WITH CONTRAST TECHNIQUE: Multidetector CT imaging of the abdomen and pelvis was performed using the standard protocol during bolus administration of intravenous contrast. Multiplanar reconstructed images and MIPs were obtained and reviewed to evaluate the vascular anatomy. RADIATION DOSE REDUCTION: This exam was performed according to the departmental dose-optimization program which includes automated exposure control, adjustment of the mA and/or kV according to patient size and/or use of iterative reconstruction technique. CONTRAST:  80mL OMNIPAQUE IOHEXOL 350 MG/ML SOLN COMPARISON:  CT abdomen and pelvis 12/15/2020 FINDINGS: VASCULAR Aorta: Normal caliber aorta without aneurysm, dissection, vasculitis or significant stenosis. There is mild calcified atherosclerotic disease throughout the aorta. Celiac: Patent without evidence of aneurysm, dissection, vasculitis or significant stenosis. SMA: Patent without evidence of aneurysm, dissection, vasculitis or significant stenosis. Renals: Both renal arteries are patent without evidence of aneurysm, dissection, vasculitis, fibromuscular dysplasia  or significant stenosis. IMA: Patent without evidence of aneurysm, dissection, vasculitis or significant stenosis. Inflow: Patent without evidence of aneurysm, dissection, vasculitis or significant stenosis. Proximal Outflow: Bilateral common femoral and visualized portions of the superficial and profunda femoral arteries are patent without evidence of aneurysm, dissection, vasculitis or significant stenosis. Veins: No obvious venous abnormality. Review of the MIP images confirms the above findings. NON-VASCULAR Lower chest: There is atelectasis in the left lower lobe. Hepatobiliary: No focal liver abnormality is seen. No gallstones, gallbladder wall thickening, or biliary dilatation. Pancreas: Unremarkable. No pancreatic ductal dilatation or surrounding inflammatory changes. Spleen: Normal in size without focal abnormality. Adrenals/Urinary Tract: Bilateral renal cysts are present measuring up to 3.3 cm there is no hydronephrosis. Adrenal glands bladder are within normal limits. Stomach/Bowel: Large hiatal hernia is present containing the proximal stomach. The fundus of the stomach is below the diaphragm of the gastric antrum is above the diaphragm compatible with mesentero-axial volvulus. The stomach is dilated inferior and superior to the diaphragm. Appendix appears normal. No evidence of bowel wall thickening or inflammation. There is diffuse colonic diverticulosis. Lymphatic: No enlarged lymph nodes are seen. Reproductive: Prostate gland is significantly enlarged. Other: There is a small fat containing umbilical hernia. No free fluid. Musculoskeletal: L3-L5 posterior fusion hardware is present. Left hip arthroplasty is present. IMPRESSION: 1. No evidence for  mesenteric ischemia. 2. No acute vascular pathology. Aortic Atherosclerosis (ICD10-I70.0). NON-VASCULAR 1. 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 The stomach is dilated above and below the diaphragm. 2.  Colonic diverticulosis. 3. Prostatomegaly. Electronically Signed: By: Darliss Cheney M.D. On: 10/14/2022 16:13   DG Abd Portable 1 View  Result Date: 10/14/2022 CLINICAL DATA:  Check gastric catheter placement EXAM: PORTABLE ABDOMEN - 1 VIEW COMPARISON:  Film from 5 minutes previous FINDINGS: Gastric catheter is again noted with the tip in the stomach. It has withdrawn slightly in the interval from the prior exam although given the large hiatal hernia, the proximal side port likely is within the stomach. No other focal abnormality is noted. IMPRESSION: Slight withdrawal of gastric catheter as described. Electronically Signed   By: Alcide Clever M.D.   On: 10/14/2022 18:28   DG Abd Portable 1 View  Result Date: 10/14/2022 CLINICAL DATA:  Check gastric catheter placement EXAM: PORTABLE ABDOMEN - 1 VIEW COMPARISON:  CT from earlier in the same day. FINDINGS: Gastric catheter is noted within the stomach. Scattered bowel gas is noted without free air. IMPRESSION: Gastric catheter within the stomach. Electronically Signed   By: Alcide Clever M.D.   On: 10/14/2022 18:27   DG Chest Portable 1 View  Result Date: 10/14/2022 CLINICAL DATA:  Chest and abdominal pain. Shortness of breath and nausea. EXAM: PORTABLE CHEST 1 VIEW COMPARISON:  03/17/2022 FINDINGS: Shallow inspiration. Heart size and pulmonary vascularity are normal for technique. Fibrosis or linear atelectasis in the left base. Lungs are otherwise clear and expanded. No pleural effusions. No pneumothorax. Mediastinal contours appear intact. Large esophageal hiatal hernia behind the heart. Similar appearance to previous study. IMPRESSION: Linear fibrosis or atelectasis in the left base. Large esophageal hiatal hernia. No evidence of active pulmonary disease. Electronically Signed   By: Burman Nieves M.D.   On: 10/14/2022 15:24       LOS: 1 day   Osvaldo Shipper  [facility] Pager on [email or web address]  10/15/2022, 9:18 AM

## 2022-10-15 NOTE — Anesthesia Preprocedure Evaluation (Addendum)
Anesthesia Evaluation  Patient identified by MRN, date of birth, ID band Patient awake    Reviewed: Allergy & Precautions, NPO status , Patient's Chart, lab work & pertinent test results, reviewed documented beta blocker date and time   History of Anesthesia Complications (+) PONV and history of anesthetic complications  Airway Mallampati: II  TM Distance: >3 FB Neck ROM: Full    Dental no notable dental hx.    Pulmonary Current Smoker   Pulmonary exam normal        Cardiovascular hypertension, Pt. on home beta blockers and Pt. on medications Normal cardiovascular exam+ dysrhythmias (on Eliquis) Atrial Fibrillation      Neuro/Psych    Depression       GI/Hepatic hiatal hernia,GERD  ,,Gastric vovlulus   Endo/Other    Renal/GU Renal InsufficiencyRenal disease     Musculoskeletal  (+) Arthritis ,    Abdominal   Peds  Hematology   Anesthesia Other Findings   Reproductive/Obstetrics                             Anesthesia Physical Anesthesia Plan  ASA: 2 and emergent  Anesthesia Plan: MAC   Post-op Pain Management: Minimal or no pain anticipated   Induction:   PONV Risk Score and Plan: 1 and Treatment may vary due to age or medical condition and Propofol infusion  Airway Management Planned:   Additional Equipment: None  Intra-op Plan:   Post-operative Plan:   Informed Consent: I have reviewed the patients History and Physical, chart, labs and discussed the procedure including the risks, benefits and alternatives for the proposed anesthesia with the patient or authorized representative who has indicated his/her understanding and acceptance.       Plan Discussed with: CRNA  Anesthesia Plan Comments:        Anesthesia Quick Evaluation

## 2022-10-16 ENCOUNTER — Inpatient Hospital Stay (HOSPITAL_COMMUNITY): Payer: Medicare Other

## 2022-10-16 DIAGNOSIS — K3189 Other diseases of stomach and duodenum: Secondary | ICD-10-CM | POA: Diagnosis not present

## 2022-10-16 LAB — CBC
HCT: 39.6 % (ref 39.0–52.0)
Hemoglobin: 12.5 g/dL — ABNORMAL LOW (ref 13.0–17.0)
MCH: 32.7 pg (ref 26.0–34.0)
MCHC: 31.6 g/dL (ref 30.0–36.0)
MCV: 103.7 fL — ABNORMAL HIGH (ref 80.0–100.0)
Platelets: 133 10*3/uL — ABNORMAL LOW (ref 150–400)
RBC: 3.82 MIL/uL — ABNORMAL LOW (ref 4.22–5.81)
RDW: 14.5 % (ref 11.5–15.5)
WBC: 6.1 10*3/uL (ref 4.0–10.5)
nRBC: 0 % (ref 0.0–0.2)

## 2022-10-16 LAB — COMPREHENSIVE METABOLIC PANEL
ALT: 20 U/L (ref 0–44)
AST: 32 U/L (ref 15–41)
Albumin: 3.5 g/dL (ref 3.5–5.0)
Alkaline Phosphatase: 50 U/L (ref 38–126)
Anion gap: 7 (ref 5–15)
BUN: 20 mg/dL (ref 8–23)
CO2: 28 mmol/L (ref 22–32)
Calcium: 8.4 mg/dL — ABNORMAL LOW (ref 8.9–10.3)
Chloride: 101 mmol/L (ref 98–111)
Creatinine, Ser: 1.43 mg/dL — ABNORMAL HIGH (ref 0.61–1.24)
GFR, Estimated: 48 mL/min — ABNORMAL LOW (ref >60)
Glucose, Bld: 93 mg/dL (ref 70–99)
Potassium: 4.3 mmol/L (ref 3.5–5.1)
Sodium: 136 mmol/L (ref 135–145)
Total Bilirubin: 1 mg/dL (ref 0.3–1.2)
Total Protein: 5.6 g/dL — ABNORMAL LOW (ref 6.5–8.1)

## 2022-10-16 LAB — MAGNESIUM: Magnesium: 2.1 mg/dL (ref 1.7–2.4)

## 2022-10-16 MED ORDER — HYDROMORPHONE HCL 1 MG/ML IJ SOLN: 1.0000 mg | INTRAMUSCULAR | Status: DC | PRN

## 2022-10-16 MED ORDER — OXYCODONE HCL 5 MG PO TABS: 5.0000 mg | ORAL_TABLET | ORAL | Status: DC | PRN

## 2022-10-16 NOTE — Progress Notes (Signed)
PROGRESS NOTE    BERNARD BANKEN  ZOX:[id number] DOB: [birth year] DOA: 10/14/2022 PCP: [name], MD   Brief Narrative: 82 year old male with history of hiatal hernia admitted with abdominal pain found to have gastric volvulus status post EGD 10/15/2022 with reduction of the hernia.  Seen in consultation by general surgery and GI.  Past medical history significant for CKD stage IIIa, history of GI bleed, duodenal ulcer, hypertension, permanent atrial fibrillation on Eliquis, restless leg syndrome.  He complains of nausea today has not had any vomiting.  No flatus.  No bowel movements.  Assessment & Plan:   Principal Problem:   Gastric volvulus Active Problems:   Persistent atrial fibrillation (HCC)   Hypertension   Essential hypertension   Hiatal hernia  Large hiatal hernia with gastric volvulus-status post EGD 10/15/2022 with reduction of the hernia.  He reports improvement in pain.  But complains of nausea no flatus had a very small bowel movement yesterday? KUB today Currently on clear liquid diet Continue PPI Followed by GI and general surgery.   Permanent atrial fibrillation Followed by Dr. [place].  Rate is controlled.  Monitor on telemetry.  Eliquis is on hold.  IV metoprolol as needed. Prior to admission he was on carvedilol   History of GI bleed due to duodenal ulcer 2022   Chronic kidney disease stage IIIa Renal function has been stable.  Monitor urine output.  Avoid nephrotoxic agents. Creatinine 1.43 from 1.40 yesterday   Essential hypertension-blood pressure 129/78 Monitor blood pressures closely.  Oral medications currently on hold.  On IV metoprolol as needed   Dyslipidemia Statin on hold.   History of peripheral neuropathy/restless leg syndrome Noted to be on Cymbalta, Lyrica, Requip and Klonopin prior to admission.  These are currently on hold due to n.p.o. status.   Obesity Estimated body mass index is 34.25 kg/m as calculated from the following:   Height  as of 09/18/22: 5\' 10"  (1.778 m).   Weight as of this encounter: 108.3 kg.    Estimated body mass index is 34.26 kg/m as calculated from the following:   Height as of this encounter: 5\' 10"  (1.778 m).   Weight as of this encounter: 108.3 kg.  DVT prophylaxis: SCD  code Status: Full Family Communication: Son at bedside Disposition Plan:  Status is: Inpatient Remains inpatient appropriate because: Gastric volvulus   Consultants:  GI and general surgery  Procedures: EGD 10/15/2022 Antimicrobials: None  Subjective: Patient sitting up by the side of the bed edge of the bed complaining of nausea son at bedside Did not have any vomiting overnight but has not had any flatus he thinks he might of had a very small BM yesterday  Objective: Vitals:   10/15/22 2358 10/16/22 0352 10/16/22 0400 10/16/22 0839  BP: 98/63 126/75 126/75 129/78  Pulse: 83 87 86 76  Resp: 16 16  16   Temp: 99.1 F (37.3 C) 98.7 F (37.1 C)  98.5 F (36.9 C)  TempSrc: Oral Oral  Oral  SpO2: 97% 90% 99%   Weight:      Height:        Intake/Output Summary (Last 24 hours) at 10/16/2022 1003 Last data filed at 10/16/2022 0839 Gross per 24 hour  Intake 2501.24 ml  Output 1100 ml  Net 1401.24 ml   Filed Weights   10/14/22 2211 10/15/22 0942  Weight: 108.3 kg 108.3 kg    Examination:  General exam: Appears in no acute distress Respiratory system: Clear to auscultation. Respiratory effort normal. Cardiovascular  system: S1 & S2 heard, RRR. No JVD, murmurs, rubs, gallops or clicks. No pedal edema. Gastrointestinal system: Abdomen is distended, soft and nontender. No organomegaly or masses felt. Normal bowel sounds heard. Central nervous system: Alert and oriented. No focal neurological deficits. Extremities: Trace bilateral pitting edema Skin: No rashes, lesions or ulcers Psychiatry: Judgement and insight appear normal. Mood & affect appropriate.     Data Reviewed: I have personally reviewed following  labs and imaging studies  CBC: Recent Labs  Lab 10/14/22 1350 10/15/22 0257 10/16/22 0315  WBC 6.6 7.4 6.1  HGB 13.6 12.3* 12.5*  HCT 42.3 38.4* 39.6  MCV 102.7* 102.9* 103.7*  PLT 163 152 133*   Basic Metabolic Panel: Recent Labs  Lab 10/14/22 1350 10/15/22 0829 10/16/22 0315  NA 137 140 136  K 4.5 4.2 4.3  CL 102 106 101  CO2 27 29 28   GLUCOSE 106* 97 93  BUN 21 18 20   CREATININE 1.40* 1.40* 1.43*  CALCIUM 8.8* 8.4* 8.4*  MG  --   --  2.1   GFR: Estimated Creatinine Clearance: 47.4 mL/min (A) (by C-G formula based on SCr of 1.43 mg/dL (H)). Liver Function Tests: Recent Labs  Lab 10/14/22 1350 10/16/22 0315  AST 37 32  ALT 27 20  ALKPHOS 57 50  BILITOT 1.0 1.0  PROT 6.6 5.6*  ALBUMIN 4.1 3.5   Recent Labs  Lab 10/14/22 1350  LIPASE 25   No results for input(s): "AMMONIA" in the last 168 hours. Coagulation Profile: No results for input(s): "INR", "PROTIME" in the last 168 hours. Cardiac Enzymes: No results for input(s): "CKTOTAL", "CKMB", "CKMBINDEX", "TROPONINI" in the last 168 hours. BNP (last 3 results) No results for input(s): "PROBNP" in the last 8760 hours. HbA1C: No results for input(s): "HGBA1C" in the last 72 hours. CBG: No results for input(s): "GLUCAP" in the last 168 hours. Lipid Profile: No results for input(s): "CHOL", "HDL", "LDLCALC", "TRIG", "CHOLHDL", "LDLDIRECT" in the last 72 hours. Thyroid Function Tests: No results for input(s): "TSH", "T4TOTAL", "FREET4", "T3FREE", "THYROIDAB" in the last 72 hours. Anemia Panel: Recent Labs    10/15/22 1145  VITAMINB12 454  FOLATE 31.9   Sepsis Labs: Recent Labs  Lab 10/14/22 1647  LATICACIDVEN 1.2    No results found for this or any previous visit (from the past 240 hour(s)).       Radiology Studies: CT ANGIO ABDOMEN PELVIS  W &/OR WO CONTRAST  Addendum Date: 10/14/2022   ADDENDUM REPORT: 10/14/2022 23:36 ADDENDUM: These results were called by telephone at the time of  interpretation on 10/14/2022 at 4:17 p.m. to provider Dr. Earlene Plater, who verbally acknowledged these results. Electronically Signed   By: Darliss Cheney M.D.   On: 10/14/2022 23:36   Result Date: 10/14/2022 CLINICAL DATA:  Abdominal pain, evaluate for mesenteric ischemia. EXAM: CTA ABDOMEN AND PELVIS WITHOUT AND WITH CONTRAST TECHNIQUE: Multidetector CT imaging of the abdomen and pelvis was performed using the standard protocol during bolus administration of intravenous contrast. Multiplanar reconstructed images and MIPs were obtained and reviewed to evaluate the vascular anatomy. RADIATION DOSE REDUCTION: This exam was performed according to the departmental dose-optimization program which includes automated exposure control, adjustment of the mA and/or kV according to patient size and/or use of iterative reconstruction technique. CONTRAST:  80mL OMNIPAQUE IOHEXOL 350 MG/ML SOLN COMPARISON:  CT abdomen and pelvis 12/15/2020 FINDINGS: VASCULAR Aorta: Normal caliber aorta without aneurysm, dissection, vasculitis or significant stenosis. There is mild calcified atherosclerotic disease throughout the aorta. Celiac: Patent  without evidence of aneurysm, dissection, vasculitis or significant stenosis. SMA: Patent without evidence of aneurysm, dissection, vasculitis or significant stenosis. Renals: Both renal arteries are patent without evidence of aneurysm, dissection, vasculitis, fibromuscular dysplasia or significant stenosis. IMA: Patent without evidence of aneurysm, dissection, vasculitis or significant stenosis. Inflow: Patent without evidence of aneurysm, dissection, vasculitis or significant stenosis. Proximal Outflow: Bilateral common femoral and visualized portions of the superficial and profunda femoral arteries are patent without evidence of aneurysm, dissection, vasculitis or significant stenosis. Veins: No obvious venous abnormality. Review of the MIP images confirms the above findings. NON-VASCULAR Lower chest:  There is atelectasis in the left lower lobe. Hepatobiliary: No focal liver abnormality is seen. No gallstones, gallbladder wall thickening, or biliary dilatation. Pancreas: Unremarkable. No pancreatic ductal dilatation or surrounding inflammatory changes. Spleen: Normal in size without focal abnormality. Adrenals/Urinary Tract: Bilateral renal cysts are present measuring up to 3.3 cm there is no hydronephrosis. Adrenal glands bladder are within normal limits. Stomach/Bowel: Large hiatal hernia is present containing the proximal stomach. The fundus of the stomach is below the diaphragm of the gastric antrum is above the diaphragm compatible with mesentero-axial volvulus. The stomach is dilated inferior and superior to the diaphragm. Appendix appears normal. No evidence of bowel wall thickening or inflammation. There is diffuse colonic diverticulosis. Lymphatic: No enlarged lymph nodes are seen. Reproductive: Prostate gland is significantly enlarged. Other: There is a small fat containing umbilical hernia. No free fluid. Musculoskeletal: L3-L5 posterior fusion hardware is present. Left hip arthroplasty is present. IMPRESSION: 1. No evidence for mesenteric ischemia. 2. No acute vascular pathology. Aortic Atherosclerosis (ICD10-I70.0). NON-VASCULAR 1. 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 The stomach is dilated above and below the diaphragm. 2. Colonic diverticulosis. 3. Prostatomegaly. Electronically Signed: By: Darliss Cheney M.D. On: 10/14/2022 16:13   DG Abd Portable 1 View  Result Date: 10/14/2022 CLINICAL DATA:  Check gastric catheter placement EXAM: PORTABLE ABDOMEN - 1 VIEW COMPARISON:  Film from 5 minutes previous FINDINGS: Gastric catheter is again noted with the tip in the stomach. It has withdrawn slightly in the interval from the prior exam although given the large hiatal hernia, the proximal side port likely is within the stomach. No other focal  abnormality is noted. IMPRESSION: Slight withdrawal of gastric catheter as described. Electronically Signed   By: Alcide Clever M.D.   On: 10/14/2022 18:28   DG Abd Portable 1 View  Result Date: 10/14/2022 CLINICAL DATA:  Check gastric catheter placement EXAM: PORTABLE ABDOMEN - 1 VIEW COMPARISON:  CT from earlier in the same day. FINDINGS: Gastric catheter is noted within the stomach. Scattered bowel gas is noted without free air. IMPRESSION: Gastric catheter within the stomach. Electronically Signed   By: Alcide Clever M.D.   On: 10/14/2022 18:27   DG Chest Portable 1 View  Result Date: 10/14/2022 CLINICAL DATA:  Chest and abdominal pain. Shortness of breath and nausea. EXAM: PORTABLE CHEST 1 VIEW COMPARISON:  03/17/2022 FINDINGS: Shallow inspiration. Heart size and pulmonary vascularity are normal for technique. Fibrosis or linear atelectasis in the left base. Lungs are otherwise clear and expanded. No pleural effusions. No pneumothorax. Mediastinal contours appear intact. Large esophageal hiatal hernia behind the heart. Similar appearance to previous study. IMPRESSION: Linear fibrosis or atelectasis in the left base. Large esophageal hiatal hernia. No evidence of active pulmonary disease. Electronically Signed   By: Burman Nieves M.D.   On: 10/14/2022 15:24        Scheduled Meds:  pantoprazole (PROTONIX) IV  40 mg Intravenous Q12H   sodium chloride flush  3 mL Intravenous Q12H   Continuous Infusions:   LOS: 2 days   Time spent: 39 min  Alwyn Ren, MD  10/16/2022, 10:03 AM

## 2022-10-16 NOTE — Progress Notes (Addendum)
[facility] Progress Note  1 Day Post-Op  Subjective: CC:  Overall feeling much better than he did at admission. Feeling slightly worse this morning than yesterday afternoon (after EGD) due to some nausea overnight. No vomiting and nausea is overall better than at admission. Abdominal pain still present but overall improved and not worsened with clear liquids - located bilateral lower abdomen but not centrally. Small bowel movement yesterday. Has not ambulated.  Friend at bedside  Objective: Vital signs in last 24 hours: Temp:  [98 F (36.7 C)-99.8 F (37.7 C)] 98.5 F (36.9 C) (05/15 0839) Pulse Rate:  [50-87] 76 (05/15 0839) Resp:  [11-18] 16 (05/15 0839) BP: (98-129)/(63-90) 129/78 (05/15 0839) SpO2:  [90 %-99 %] 99 % (05/15 0400) Last BM Date : 10/14/22  Intake/Output from previous day: 05/14 0701 - 05/15 0700 In: 2501.2 [P.O.:960; I.V.:1541.2] Out: 900 [Urine:900] Intake/Output this shift: Total I/O In: -  Out: 200 [Urine:200]  PE: Gen:  Alert, NAD, pleasant Card:  irregular, regular rate Pulm:  Normal effort Abd: Soft, nondistended, no hernias. NT to palpation Skin: warm and dry, no rashes  Psych: A&Ox3   Lab Results:  Recent Labs    10/15/22 0257 10/16/22 0315  WBC 7.4 6.1  HGB 12.3* 12.5*  HCT 38.4* 39.6  PLT 152 133*    BMET Recent Labs    10/15/22 0829 10/16/22 0315  NA 140 136  K 4.2 4.3  CL 106 101  CO2 29 28  GLUCOSE 97 93  BUN 18 20  CREATININE 1.40* 1.43*  CALCIUM 8.4* 8.4*    PT/INR No results for input(s): "LABPROT", "INR" in the last 72 hours. CMP     Component Value Date/Time   NA 136 10/16/2022 0315   NA 138 04/29/2016 0000   K 4.3 10/16/2022 0315   CL 101 10/16/2022 0315   CO2 28 10/16/2022 0315   GLUCOSE 93 10/16/2022 0315   BUN 20 10/16/2022 0315   BUN 19 04/29/2016 0000   CREATININE 1.43 (H) 10/16/2022 0315   CALCIUM 8.4 (L) 10/16/2022 0315   PROT 5.6 (L) 10/16/2022 0315   PROT 6.4 04/25/2021 0837    ALBUMIN 3.5 10/16/2022 0315   ALBUMIN 4.4 01/13/2017 0846   AST 32 10/16/2022 0315   ALT 20 10/16/2022 0315   ALKPHOS 50 10/16/2022 0315   BILITOT 1.0 10/16/2022 0315   BILITOT 0.4 01/13/2017 0846   GFRNONAA 48 (L) 10/16/2022 0315   GFRAA 59 (L) 04/14/2017 1000   Lipase     Component Value Date/Time   LIPASE 25 10/14/2022 1350       Studies/Results: CT ANGIO ABDOMEN PELVIS  W &/OR WO CONTRAST  Addendum Date: 10/14/2022   ADDENDUM REPORT: 10/14/2022 23:36 ADDENDUM: These results were called by telephone at the time of interpretation on 10/14/2022 at 4:17 p.m. to provider Dr. Earlene Plater, who verbally acknowledged these results. Electronically Signed   By: Darliss Cheney M.D.   On: 10/14/2022 23:36   Result Date: 10/14/2022 CLINICAL DATA:  Abdominal pain, evaluate for mesenteric ischemia. EXAM: CTA ABDOMEN AND PELVIS WITHOUT AND WITH CONTRAST TECHNIQUE: Multidetector CT imaging of the abdomen and pelvis was performed using the standard protocol during bolus administration of intravenous contrast. Multiplanar reconstructed images and MIPs were obtained and reviewed to evaluate the vascular anatomy. RADIATION DOSE REDUCTION: This exam was performed according to the departmental dose-optimization program which includes automated exposure control, adjustment of the mA and/or kV according to patient size and/or use of iterative reconstruction technique. CONTRAST:  80mL OMNIPAQUE IOHEXOL 350 MG/ML SOLN COMPARISON:  CT abdomen and pelvis 12/15/2020 FINDINGS: VASCULAR Aorta: Normal caliber aorta without aneurysm, dissection, vasculitis or significant stenosis. There is mild calcified atherosclerotic disease throughout the aorta. Celiac: Patent without evidence of aneurysm, dissection, vasculitis or significant stenosis. SMA: Patent without evidence of aneurysm, dissection, vasculitis or significant stenosis. Renals: Both renal arteries are patent without evidence of aneurysm, dissection, vasculitis,  fibromuscular dysplasia or significant stenosis. IMA: Patent without evidence of aneurysm, dissection, vasculitis or significant stenosis. Inflow: Patent without evidence of aneurysm, dissection, vasculitis or significant stenosis. Proximal Outflow: Bilateral common femoral and visualized portions of the superficial and profunda femoral arteries are patent without evidence of aneurysm, dissection, vasculitis or significant stenosis. Veins: No obvious venous abnormality. Review of the MIP images confirms the above findings. NON-VASCULAR Lower chest: There is atelectasis in the left lower lobe. Hepatobiliary: No focal liver abnormality is seen. No gallstones, gallbladder wall thickening, or biliary dilatation. Pancreas: Unremarkable. No pancreatic ductal dilatation or surrounding inflammatory changes. Spleen: Normal in size without focal abnormality. Adrenals/Urinary Tract: Bilateral renal cysts are present measuring up to 3.3 cm there is no hydronephrosis. Adrenal glands bladder are within normal limits. Stomach/Bowel: Large hiatal hernia is present containing the proximal stomach. The fundus of the stomach is below the diaphragm of the gastric antrum is above the diaphragm compatible with mesentero-axial volvulus. The stomach is dilated inferior and superior to the diaphragm. Appendix appears normal. No evidence of bowel wall thickening or inflammation. There is diffuse colonic diverticulosis. Lymphatic: No enlarged lymph nodes are seen. Reproductive: Prostate gland is significantly enlarged. Other: There is a small fat containing umbilical hernia. No free fluid. Musculoskeletal: L3-L5 posterior fusion hardware is present. Left hip arthroplasty is present. IMPRESSION: 1. No evidence for mesenteric ischemia. 2. No acute vascular pathology. Aortic Atherosclerosis (ICD10-I70.0). NON-VASCULAR 1. 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 The stomach is dilated above and  below the diaphragm. 2. Colonic diverticulosis. 3. Prostatomegaly. Electronically Signed: By: Darliss Cheney M.D. On: 10/14/2022 16:13   DG Abd Portable 1 View  Result Date: 10/14/2022 CLINICAL DATA:  Check gastric catheter placement EXAM: PORTABLE ABDOMEN - 1 VIEW COMPARISON:  Film from 5 minutes previous FINDINGS: Gastric catheter is again noted with the tip in the stomach. It has withdrawn slightly in the interval from the prior exam although given the large hiatal hernia, the proximal side port likely is within the stomach. No other focal abnormality is noted. IMPRESSION: Slight withdrawal of gastric catheter as described. Electronically Signed   By: Alcide Clever M.D.   On: 10/14/2022 18:28   DG Abd Portable 1 View  Result Date: 10/14/2022 CLINICAL DATA:  Check gastric catheter placement EXAM: PORTABLE ABDOMEN - 1 VIEW COMPARISON:  CT from earlier in the same day. FINDINGS: Gastric catheter is noted within the stomach. Scattered bowel gas is noted without free air. IMPRESSION: Gastric catheter within the stomach. Electronically Signed   By: Alcide Clever M.D.   On: 10/14/2022 18:27   DG Chest Portable 1 View  Result Date: 10/14/2022 CLINICAL DATA:  Chest and abdominal pain. Shortness of breath and nausea. EXAM: PORTABLE CHEST 1 VIEW COMPARISON:  03/17/2022 FINDINGS: Shallow inspiration. Heart size and pulmonary vascularity are normal for technique. Fibrosis or linear atelectasis in the left base. Lungs are otherwise clear and expanded. No pleural effusions. No pneumothorax. Mediastinal contours appear intact. Large esophageal hiatal hernia behind the heart. Similar appearance to previous study. IMPRESSION: Linear fibrosis or atelectasis  in the left base. Large esophageal hiatal hernia. No evidence of active pulmonary disease. Electronically Signed   By: Burman Nieves M.D.   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 Myrtie Neither, NGT removed during this procedure   Hiatal Hernia  - patient clinically improved s/p EGD. Tolerating CLD and +BM.  - abd xray pending. Will follow. Possible advancement to FLD today - findings reviewed with Dr. Derrell Lolling. If patient does well with PO intake then would plan for hospital discharge with close follow up in our office to scheduled elective [facility] repair, if worsening then will need repair this admission. Continue to monitor diet advancement - encouraged oob/ambulation today  FEN: CLD. Await abd xray. possible advancement to FLD ID: none VTE: hold eliquis. Okay for chemical ppx   LOS: 2 days   I reviewed nursing notes, Consultant GI notes, hospitalist notes, last 24 h vitals and pain scores, last 48 h intake and output, last 24 h labs and trends, and last 24 h imaging results.   Eric Form, PA-C [facility] 10/16/2022, 11:10 AM Please see Amion for pager number during day hours 7:00am-4:30pm

## 2022-10-16 NOTE — Care Management (Signed)
  Transition of Care [facility]) Screening Note   Patient Details  Name: RUEL TOSI Date of Birth: June 22, 1940   [facility] of Care [facility]) CM/SW Contact:    Gala Lewandowsky, RN Phone Number: 10/16/2022, 4:41 PM    Transition of Care Department [facility]) has reviewed the patient and no TOC needs have been identified at this time. Patient presented for abdominal pain-POD-1 EGD on clear liquids. Patient lives at [facility]. We will continue to monitor patient advancement through interdisciplinary progression rounds. If new patient transition needs arise, please place a TOC consult.

## 2022-10-17 ENCOUNTER — Inpatient Hospital Stay (HOSPITAL_COMMUNITY): Payer: Medicare Other

## 2022-10-17 ENCOUNTER — Encounter (HOSPITAL_COMMUNITY): Payer: Self-pay | Admitting: Gastroenterology

## 2022-10-17 DIAGNOSIS — K3189 Other diseases of stomach and duodenum: Secondary | ICD-10-CM | POA: Diagnosis not present

## 2022-10-17 LAB — COMPREHENSIVE METABOLIC PANEL
ALT: 24 U/L (ref 0–44)
AST: 50 U/L — ABNORMAL HIGH (ref 15–41)
Albumin: 3.5 g/dL (ref 3.5–5.0)
Alkaline Phosphatase: 48 U/L (ref 38–126)
Anion gap: 10 (ref 5–15)
BUN: 16 mg/dL (ref 8–23)
CO2: 24 mmol/L (ref 22–32)
Calcium: 8.6 mg/dL — ABNORMAL LOW (ref 8.9–10.3)
Chloride: 102 mmol/L (ref 98–111)
Creatinine, Ser: 1.44 mg/dL — ABNORMAL HIGH (ref 0.61–1.24)
GFR, Estimated: 48 mL/min — ABNORMAL LOW (ref >60)
Glucose, Bld: 88 mg/dL (ref 70–99)
Potassium: 3.8 mmol/L (ref 3.5–5.1)
Sodium: 136 mmol/L (ref 135–145)
Total Bilirubin: 1.1 mg/dL (ref 0.3–1.2)
Total Protein: 5.8 g/dL — ABNORMAL LOW (ref 6.5–8.1)

## 2022-10-17 LAB — CBC
HCT: 38.5 % — ABNORMAL LOW (ref 39.0–52.0)
Hemoglobin: 12.7 g/dL — ABNORMAL LOW (ref 13.0–17.0)
MCH: 32.8 pg (ref 26.0–34.0)
MCHC: 33 g/dL (ref 30.0–36.0)
MCV: 99.5 fL (ref 80.0–100.0)
Platelets: 136 10*3/uL — ABNORMAL LOW (ref 150–400)
RBC: 3.87 MIL/uL — ABNORMAL LOW (ref 4.22–5.81)
RDW: 13.9 % (ref 11.5–15.5)
WBC: 5.1 10*3/uL (ref 4.0–10.5)
nRBC: 0 % (ref 0.0–0.2)

## 2022-10-17 MED ORDER — BISACODYL 5 MG PO TBEC: 10.0000 mg | DELAYED_RELEASE_TABLET | Freq: Every day | ORAL | 1 refills | Status: DC | PRN

## 2022-10-17 NOTE — Care Management Important Message (Signed)
Important Message  Patient Details  Name: [name] MRN: [id number] Date of Birth: [birth year]   Medicare Important Message Given:  Yes     Renie Ora 10/17/2022, 8:41 AM

## 2022-10-17 NOTE — Evaluation (Signed)
Physical Therapy Evaluation Patient Details Name: WALBERTO FITTON MRN: [id number] DOB: 1940-07-07 Today's Date: 10/17/2022  History of Present Illness  Pt is a 82 year old male admitted on 10/14/22 for abdominal pain. Past medical history significant for permanent atrial fibrillation on Eliquis, HTN, CKD stage IIIa, HLD, history of GI bleed due to duodenal ulcer, peripheral neuropathy, RLS.  Clinical Impression  Pt presents with admitting diagnosis above. Pt was able to ambulate in the hallway today at Mod I level with RW. Pt presents at or near baseline mobility. Pt has no further acute PT needs and will be signing off. Re consult PT if mobility status changes.        Recommendations for follow up therapy are one component of a multi-disciplinary discharge planning process, led by the attending physician.  Recommendations may be updated based on patient status, additional functional criteria and insurance authorization.  Follow Up Recommendations       Assistance Recommended at Discharge PRN  Patient can return home with the following  Help with stairs or ramp for entrance;Assist for transportation    Equipment Recommendations None recommended by PT  Recommendations for Other Services       Functional Status Assessment Patient has had a recent decline in their functional status and demonstrates the ability to make significant improvements in function in a reasonable and predictable amount of time.     Precautions / Restrictions Precautions Precautions: Fall Restrictions Weight Bearing Restrictions: No      Mobility  Bed Mobility Overal bed mobility: Independent                  Transfers Overall transfer level: Independent Equipment used: Rolling walker (2 wheels)                    Ambulation/Gait Ambulation/Gait assistance: Modified independent (Device/Increase time) Gait Distance (Feet): 150 Feet Assistive device: Rolling walker (2 wheels) Gait  Pattern/deviations: WFL(Within Functional Limits) Gait velocity: decreased     General Gait Details: no LOB noted.  Stairs            Wheelchair Mobility    Modified Rankin (Stroke Patients Only)       Balance Overall balance assessment: No apparent balance deficits (not formally assessed)                                           Pertinent Vitals/Pain Pain Assessment Pain Assessment: No/denies pain    Home Living Family/patient expects to be discharged to:: Private residence Living Arrangements: Alone Available Help at Discharge: Family;Available PRN/intermittently Type of Home: Independent living facility Home Access: Elevator       Home Layout: One level Home Equipment: Agricultural consultant (2 wheels);Rollator (4 wheels);Cane - single point;Shower seat - built in;Shower seat;Grab bars - tub/shower;Hand held shower head      Prior Function Prior Level of Function : Independent/Modified Independent;Driving             Mobility Comments: Mod I no AD at home and RW in the community ADLs Comments: Ind     Hand Dominance   Dominant Hand: Right    Extremity/Trunk Assessment   Upper Extremity Assessment Upper Extremity Assessment: Overall WFL for tasks assessed    Lower Extremity Assessment Lower Extremity Assessment: Overall WFL for tasks assessed    Cervical / Trunk Assessment Cervical / Trunk Assessment: Normal  Communication   Communication: No difficulties  Cognition Arousal/Alertness: Awake/alert Behavior During Therapy: WFL for tasks assessed/performed Overall Cognitive Status: Within Functional Limits for tasks assessed                                          General Comments General comments (skin integrity, edema, etc.): VSS on RA    Exercises     Assessment/Plan    PT Assessment Patient does not need any further PT services  PT Problem List         PT Treatment Interventions      PT Goals  (Current goals can be found in the Care Plan section)       Frequency       Co-evaluation               AM-PAC PT "6 Clicks" Mobility  Outcome Measure Help needed turning from your back to your side while in a flat bed without using bedrails?: None Help needed moving from lying on your back to sitting on the side of a flat bed without using bedrails?: None Help needed moving to and from a bed to a chair (including a wheelchair)?: None Help needed standing up from a chair using your arms (e.g., wheelchair or bedside chair)?: None Help needed to walk in hospital room?: None Help needed climbing 3-5 steps with a railing? : None 6 Click Score: 24    End of Session Equipment Utilized During Treatment: Gait belt Activity Tolerance: Patient tolerated treatment well Patient left: in bed;with call bell/phone within reach Nurse Communication: Mobility status PT Visit Diagnosis: Other abnormalities of gait and mobility (R26.89)    Time: 8657-8469 PT Time Calculation (min) (ACUTE ONLY): 17 min   Charges:   PT Evaluation $PT Eval Low Complexity: 1 Low PT Treatments $Gait Training: 8-22 mins        Shela Nevin, PT, DPT Acute Rehab Services [phone]   Gladys Damme 10/17/2022, 12:29 PM

## 2022-10-17 NOTE — Discharge Summary (Signed)
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 Seen in consultation by general surgery and GI. Past medical history significant for CKD stage IIIa, history of GI bleed, duodenal ulcer, hypertension, permanent atrial fibrillation on Eliquis, restless leg syndrome. He complains of nausea today has not had any vomiting. No flatus. No bowel movements.   Discharge Diagnoses:  Principal Problem:   Gastric volvulus Active Problems:   Persistent atrial fibrillation (HCC)   Hypertension   Essential hypertension   Hiatal hernia  Large hiatal hernia with gastric volvulus-status post EGD 10/15/2022 with reduction of the hernia.  He reported improvement in his pain as well as able to keep fluids down diet has been advanced to soft diet on the day of discharge.  He will follow-up with Dr. Derrell Lolling.   Permanent atrial fibrillation-continue carvedilol and Eliquis.   History of GI bleed due to duodenal ulcer 2022   Chronic kidney disease stage IIIa-stable.   Essential hypertension-Continue Coreg  Dyslipidemia continue statins   History of peripheral neuropathy/restless leg syndrome Continue Cymbalta, Lyrica, Requip and Klonopin     Obesity Estimated body mass index is 34.25 kg/m as calculated from the following:   Height as of 09/18/22: 5\' 10"  (1.778 m).   Weight as of this encounter: 108.3  kg.   Estimated body mass index is 34.26 kg/m as calculated from the following:   Height as of this encounter: 5\' 10"  (1.778 m).   Weight as of this encounter: 108.3 kg.  Discharge Instructions  Discharge Instructions     Diet - low sodium heart healthy   Complete by: As directed    Increase activity slowly   Complete by: As directed       Allergies as of 10/17/2022       Reactions   Compazine [prochlorperazine Edisylate] Other (See Comments)   EXTRAPYRAMIDAL MOVEMENT [Involuntary muscle movement]   Penicillins Nausea And Vomiting, Rash, Other (See Comments)   Has patient had a PCN reaction causing immediate rash, facial/tongue/throat swelling, SOB or lightheadedness with hypotension: #  #  #  YES  #  #  #  Has patient had a PCN reaction causing severe rash involving mucus membranes or skin necrosis: No Has patient had a PCN reaction that required hospitalization No Has patient had a PCN reaction occurring within the last 10 years: No If all of the above answers are "NO", then may proceed with Cephalosporin use.   Welchol [colesevelam Hcl] Other (See Comments)   MYALGIAS [Leg aches]   Gemfibrozil Nausea Only   PATIENT TOLERATES   Red Yeast Rice [cholestin] Other (See Comments)   flushing        Medication List     TAKE these medications    acetaminophen 325 MG tablet Commonly known as: TYLENOL Take 2 tablets (650 mg total) by mouth every 6 (six) hours as needed for mild pain (or Fever >/= 101).   carvedilol 3.125 MG tablet Commonly known as: COREG Take 3.125 mg by mouth at bedtime.  clonazePAM 0.5 MG tablet Commonly known as: KLONOPIN take 1/2-1 TABLET BY MOUTH EVERYDAY AT BEDTIME AS NEEDED What changed:  how much to take how to take this when to take this reasons to take this   DULoxetine 60 MG capsule Commonly known as: CYMBALTA Take 1 capsule (60 mg total) by mouth daily.   Eliquis 5 MG Tabs tablet Generic drug: apixaban Take 1 tablet (5 mg  total) by mouth 2 (two) times daily.   ferrous sulfate 324 (65 Fe) MG Tbec Take 324 mg by mouth daily.   MULTIVITAMIN PO Take 1 tablet by mouth daily.   nitroGLYCERIN 0.4 MG SL tablet Commonly known as: NITROSTAT Place 0.4 mg under the tongue every 5 (five) minutes as needed for chest pain.   pantoprazole 40 MG tablet Commonly known as: PROTONIX Take 1 tablet (40 mg total) by mouth 2 (two) times daily before a meal. What changed: additional instructions   polyethylene glycol 17 g packet Commonly known as: MIRALAX / GLYCOLAX Take 17 g by mouth daily as needed for mild constipation.   pregabalin 100 MG capsule Commonly known as: LYRICA TAKE FOUR CAPSULES BY MOUTH AT bedtime What changed: See the new instructions.   rOPINIRole 0.5 MG tablet Commonly known as: REQUIP Take 1 tablet at 6 PM, take 1 tablet at bedtime What changed:  how much to take how to take this when to take this   rosuvastatin 10 MG tablet Commonly known as: CRESTOR TAKE 1 TABLET ONCE DAILY OR AS DIRECTED. What changed: See the new instructions.   tamsulosin 0.4 MG Caps capsule Commonly known as: FLOMAX Take 0.4 mg by mouth daily.   traMADol 50 MG tablet Commonly known as: ULTRAM Take 1 tablet (50 mg total) by mouth every 6 (six) hours as needed for moderate pain.        Follow-up Information     Axel Filler, MD Follow up.   Specialty: General Surgery Contact information: [address] [place] [address] [phone]         Andrew Dimitri, MD Follow up.   Specialty: Family Medicine Contact information: [address] [place] [address] [phone]                Allergies  Allergen Reactions   Compazine [Prochlorperazine Edisylate] Other (See Comments)    EXTRAPYRAMIDAL MOVEMENT [Involuntary muscle movement]   Penicillins Nausea And Vomiting, Rash and Other (See Comments)    Has patient had a PCN reaction causing immediate rash,  facial/tongue/throat swelling, SOB or lightheadedness with hypotension: #  #  #  YES  #  #  #  Has patient had a PCN reaction causing severe rash involving mucus membranes or skin necrosis: No Has patient had a PCN reaction that required hospitalization No Has patient had a PCN reaction occurring within the last 10 years: No If all of the above answers are "NO", then may proceed with Cephalosporin use.    Welchol [Colesevelam Hcl] Other (See Comments)    MYALGIAS [Leg aches]   Gemfibrozil Nausea Only    PATIENT TOLERATES   Red Yeast Rice [Cholestin] Other (See Comments)    flushing    Consultations: surgery   Procedures/Studies: DG CHEST PORT 1 VIEW  Result Date: 10/17/2022 CLINICAL DATA:  [id number] Hiatal hernia [id number] EXAM: PORTABLE CHEST 1 VIEW COMPARISON:  Oct 14, 2022 FINDINGS: The cardiomediastinal silhouette is unchanged in contour.Large hiatal hernia. No pleural effusion. No pneumothorax. No acute pleuroparenchymal abnormality. IMPRESSION: 1. No  acute cardiopulmonary abnormality. 2. Large hiatal hernia. Electronically Signed   By: Meda Klinefelter M.D.   On: 10/17/2022 14:03   DG Abd 1 View  Result Date: 10/16/2022 CLINICAL DATA:  [id number] Abdominal pain [id number] EXAM: ABDOMEN - 1 VIEW COMPARISON:  None Available. FINDINGS: Nonobstructive bowel gas pattern. Moderate colonic stool burden. Nasogastric tube has been presumably removed, no longer within the field of view. Partially visualized lumbar fusion hardware and left hip arthroplasty. Pelvic phleboliths. No acute osseous abnormality. IMPRESSION: No evidence of bowel obstruction.  Moderate colonic stool burden. Electronically Signed   By: Caprice Renshaw M.D.   On: 10/16/2022 12:59   CT ANGIO ABDOMEN PELVIS  W &/OR WO CONTRAST  Addendum Date: 10/14/2022   ADDENDUM REPORT: 10/14/2022 23:36 ADDENDUM: These results were called by telephone at the time of interpretation on 10/14/2022 at 4:17 p.m. to provider Dr. Earlene Plater, who verbally  acknowledged these results. Electronically Signed   By: Darliss Cheney M.D.   On: 10/14/2022 23:36   Result Date: 10/14/2022 CLINICAL DATA:  Abdominal pain, evaluate for mesenteric ischemia. EXAM: CTA ABDOMEN AND PELVIS WITHOUT AND WITH CONTRAST TECHNIQUE: Multidetector CT imaging of the abdomen and pelvis was performed using the standard protocol during bolus administration of intravenous contrast. Multiplanar reconstructed images and MIPs were obtained and reviewed to evaluate the vascular anatomy. RADIATION DOSE REDUCTION: This exam was performed according to the departmental dose-optimization program which includes automated exposure control, adjustment of the mA and/or kV according to patient size and/or use of iterative reconstruction technique. CONTRAST:  80mL OMNIPAQUE IOHEXOL 350 MG/ML SOLN COMPARISON:  CT abdomen and pelvis 12/15/2020 FINDINGS: VASCULAR Aorta: Normal caliber aorta without aneurysm, dissection, vasculitis or significant stenosis. There is mild calcified atherosclerotic disease throughout the aorta. Celiac: Patent without evidence of aneurysm, dissection, vasculitis or significant stenosis. SMA: Patent without evidence of aneurysm, dissection, vasculitis or significant stenosis. Renals: Both renal arteries are patent without evidence of aneurysm, dissection, vasculitis, fibromuscular dysplasia or significant stenosis. IMA: Patent without evidence of aneurysm, dissection, vasculitis or significant stenosis. Inflow: Patent without evidence of aneurysm, dissection, vasculitis or significant stenosis. Proximal Outflow: Bilateral common femoral and visualized portions of the superficial and profunda femoral arteries are patent without evidence of aneurysm, dissection, vasculitis or significant stenosis. Veins: No obvious venous abnormality. Review of the MIP images confirms the above findings. NON-VASCULAR Lower chest: There is atelectasis in the left lower lobe. Hepatobiliary: No focal liver  abnormality is seen. No gallstones, gallbladder wall thickening, or biliary dilatation. Pancreas: Unremarkable. No pancreatic ductal dilatation or surrounding inflammatory changes. Spleen: Normal in size without focal abnormality. Adrenals/Urinary Tract: Bilateral renal cysts are present measuring up to 3.3 cm there is no hydronephrosis. Adrenal glands bladder are within normal limits. Stomach/Bowel: Large hiatal hernia is present containing the proximal stomach. The fundus of the stomach is below the diaphragm of the gastric antrum is above the diaphragm compatible with mesentero-axial volvulus. The stomach is dilated inferior and superior to the diaphragm. Appendix appears normal. No evidence of bowel wall thickening or inflammation. There is diffuse colonic diverticulosis. Lymphatic: No enlarged lymph nodes are seen. Reproductive: Prostate gland is significantly enlarged. Other: There is a small fat containing umbilical hernia. No free fluid. Musculoskeletal: L3-L5 posterior fusion hardware is present. Left hip arthroplasty is present. IMPRESSION: 1. No evidence for mesenteric ischemia. 2. No acute vascular pathology. Aortic Atherosclerosis (ICD10-I70.0). NON-VASCULAR 1. 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 The stomach is dilated above and below the diaphragm.  2. Colonic diverticulosis. 3. Prostatomegaly. Electronically Signed: By: Darliss Cheney M.D. On: 10/14/2022 16:13   DG Abd Portable 1 View  Result Date: 10/14/2022 CLINICAL DATA:  Check gastric catheter placement EXAM: PORTABLE ABDOMEN - 1 VIEW COMPARISON:  Film from 5 minutes previous FINDINGS: Gastric catheter is again noted with the tip in the stomach. It has withdrawn slightly in the interval from the prior exam although given the large hiatal hernia, the proximal side port likely is within the stomach. No other focal abnormality is noted. IMPRESSION: Slight withdrawal of gastric catheter as  described. Electronically Signed   By: Alcide Clever M.D.   On: 10/14/2022 18:28   DG Abd Portable 1 View  Result Date: 10/14/2022 CLINICAL DATA:  Check gastric catheter placement EXAM: PORTABLE ABDOMEN - 1 VIEW COMPARISON:  CT from earlier in the same day. FINDINGS: Gastric catheter is noted within the stomach. Scattered bowel gas is noted without free air. IMPRESSION: Gastric catheter within the stomach. Electronically Signed   By: Alcide Clever M.D.   On: 10/14/2022 18:27   DG Chest Portable 1 View  Result Date: 10/14/2022 CLINICAL DATA:  Chest and abdominal pain. Shortness of breath and nausea. EXAM: PORTABLE CHEST 1 VIEW COMPARISON:  03/17/2022 FINDINGS: Shallow inspiration. Heart size and pulmonary vascularity are normal for technique. Fibrosis or linear atelectasis in the left base. Lungs are otherwise clear and expanded. No pleural effusions. No pneumothorax. Mediastinal contours appear intact. Large esophageal hiatal hernia behind the heart. Similar appearance to previous study. IMPRESSION: Linear fibrosis or atelectasis in the left base. Large esophageal hiatal hernia. No evidence of active pulmonary disease. Electronically Signed   By: Burman Nieves M.D.   On: 10/14/2022 15:24   (Echo, Carotid, EGD, Colonoscopy, ERCP)    Subjective:  He is anxious to go home tolerated clear liquids surgery advance diet to soft diet no nausea vomiting Discharge Exam: Vitals:   10/17/22 1037 10/17/22 1322  BP: (!) 140/96 136/89  Pulse: 72 78  Resp: 18 18  Temp: 98.4 F (36.9 C) 98.2 F (36.8 C)  SpO2: 92% 95%   Vitals:   10/16/22 2100 10/17/22 0423 10/17/22 1037 10/17/22 1322  BP: 123/79 121/75 (!) 140/96 136/89  Pulse: 72 77 72 78  Resp: 18 18 18 18   Temp: 98.3 F (36.8 C) 98.6 F (37 C) 98.4 F (36.9 C) 98.2 F (36.8 C)  TempSrc: Oral Oral Oral Oral  SpO2: 95% 93% 92% 95%  Weight:      Height:        General: Pt is alert, awake, not in acute distress Cardiovascular: RRR, S1/S2  +, no rubs, no gallops Respiratory: CTA bilaterally, no wheezing, no rhonchi Abdominal: Soft, NT, ND, bowel sounds + Extremities: no edema, no cyanosis    The results of significant diagnostics from this hospitalization (including imaging, microbiology, ancillary and laboratory) are listed below for reference.     Microbiology: No results found for this or any previous visit (from the past 240 hour(s)).   Labs: BNP (last 3 results) Recent Labs    03/17/22 1243  BNP 92.5   Basic Metabolic Panel: Recent Labs  Lab 10/14/22 1350 10/15/22 0829 10/16/22 0315 10/17/22 0548  NA 137 140 136 136  K 4.5 4.2 4.3 3.8  CL 102 106 101 102  CO2 27 29 28 24   GLUCOSE 106* 97 93 88  BUN 21 18 20 16   CREATININE 1.40* 1.40* 1.43* 1.44*  CALCIUM 8.8* 8.4* 8.4* 8.6*  MG  --   --  2.1  --    Liver Function Tests: Recent Labs  Lab 10/14/22 1350 10/16/22 0315 10/17/22 0548  AST 37 32 50*  ALT 27 20 24   ALKPHOS 57 50 48  BILITOT 1.0 1.0 1.1  PROT 6.6 5.6* 5.8*  ALBUMIN 4.1 3.5 3.5   Recent Labs  Lab 10/14/22 1350  LIPASE 25   No results for input(s): "AMMONIA" in the last 168 hours. CBC: Recent Labs  Lab 10/14/22 1350 10/15/22 0257 10/16/22 0315 10/17/22 0310  WBC 6.6 7.4 6.1 5.1  HGB 13.6 12.3* 12.5* 12.7*  HCT 42.3 38.4* 39.6 38.5*  MCV 102.7* 102.9* 103.7* 99.5  PLT 163 152 133* 136*   Cardiac Enzymes: No results for input(s): "CKTOTAL", "CKMB", "CKMBINDEX", "TROPONINI" in the last 168 hours. BNP: Invalid input(s): "POCBNP" CBG: No results for input(s): "GLUCAP" in the last 168 hours. D-Dimer No results for input(s): "DDIMER" in the last 72 hours. Hgb A1c No results for input(s): "HGBA1C" in the last 72 hours. Lipid Profile No results for input(s): "CHOL", "HDL", "LDLCALC", "TRIG", "CHOLHDL", "LDLDIRECT" in the last 72 hours. Thyroid function studies No results for input(s): "TSH", "T4TOTAL", "T3FREE", "THYROIDAB" in the last 72 hours.  Invalid input(s):  "FREET3" Anemia work up Recent Labs    10/15/22 1145  VITAMINB12 454  FOLATE 31.9   Urinalysis    Component Value Date/Time   COLORURINE YELLOW 10/14/2022 1707   APPEARANCEUR CLEAR 10/14/2022 1707   LABSPEC 1.043 (H) 10/14/2022 1707   PHURINE 5.0 10/14/2022 1707   GLUCOSEU NEGATIVE 10/14/2022 1707   HGBUR NEGATIVE 10/14/2022 1707   BILIRUBINUR NEGATIVE 10/14/2022 1707   KETONESUR 5 (A) 10/14/2022 1707   PROTEINUR NEGATIVE 10/14/2022 1707   UROBILINOGEN 0.2 03/02/2008 1622   NITRITE NEGATIVE 10/14/2022 1707   LEUKOCYTESUR NEGATIVE 10/14/2022 1707   Sepsis Labs Recent Labs  Lab 10/14/22 1350 10/15/22 0257 10/16/22 0315 10/17/22 0310  WBC 6.6 7.4 6.1 5.1   Microbiology No results found for this or any previous visit (from the past 240 hour(s)).   Time coordinating discharge: 30 minutes  SIGNED:   Alwyn Ren, MD  [facility] 10/17/2022, 2:21 PM

## 2022-10-17 NOTE — Progress Notes (Signed)
PROGRESS NOTE    [name]  ZOX:[id number] DOB: 1941/04/14 DOA: 10/14/2022 PCP: [name], MD   Brief Narrative: 82 year old male with history of hiatal hernia admitted with abdominal pain found to have gastric volvulus status post EGD 10/15/2022 with reduction of the hernia.  Seen in consultation by general surgery and GI.  Past medical history significant for CKD stage IIIa, history of GI bleed, duodenal ulcer, hypertension, permanent atrial fibrillation on Eliquis, restless leg syndrome.  He complains of nausea today has not had any vomiting.  No flatus.  No bowel movements.  Assessment & Plan:   Principal Problem:   Gastric volvulus Active Problems:   Persistent atrial fibrillation (HCC)   Hypertension   Essential hypertension   Hiatal hernia  Large hiatal hernia with gastric volvulus-status post EGD 10/15/2022 with reduction of the hernia.  He reports improvement in pain.  But complains of nausea no flatus had a very small bowel movement yesterday? KUB -moderate colonic stool burden. Currently on clear liquid diet Continue PPI Followed by GI and general surgery.   Permanent atrial fibrillation Followed by Dr. [place].  Rate is controlled.  Monitor on telemetry.  Eliquis is on hold.  IV metoprolol as needed. Prior to admission he was on carvedilol   History of GI bleed due to duodenal ulcer 2022   Chronic kidney disease stage IIIa Renal function has been stable.  Monitor urine output.  Avoid nephrotoxic agents. Creatinine 1.43 from 1.40 yesterday   Essential hypertension-blood pressure 129/78 Monitor blood pressures closely.  Oral medications currently on hold.  On IV metoprolol as needed   Dyslipidemia Statin on hold.   History of peripheral neuropathy/restless leg syndrome Noted to be on Cymbalta, Lyrica, Requip and Klonopin prior to admission.  These are currently on hold due to n.p.o. status.   Obesity Estimated body mass index is 34.25 kg/m as calculated  from the following:   Height as of 09/18/22: 5\' 10"  (1.778 m).   Weight as of this encounter: 108.3 kg.    Estimated body mass index is 34.26 kg/m as calculated from the following:   Height as of this encounter: 5\' 10"  (1.778 m).   Weight as of this encounter: 108.3 kg.  DVT prophylaxis: SCD  code Status: Full Family Communication: Son at bedside Disposition Plan:  Status is: Inpatient Remains inpatient appropriate because: Gastric volvulus   Consultants:  GI and general surgery  Procedures: EGD 10/15/2022 Antimicrobials: None  Subjective:  Still some nausea no vomiting  Wants surgery done  Objective: Vitals:   10/16/22 1359 10/16/22 1540 10/16/22 2100 10/17/22 0423  BP: 132/78  123/79 121/75  Pulse: 74 86 72 77  Resp: 18 18 18 18   Temp: 99.5 F (37.5 C) 99.8 F (37.7 C) 98.3 F (36.8 C) 98.6 F (37 C)  TempSrc: Oral Oral Oral Oral  SpO2: 95% 95% 95% 93%  Weight:      Height:        Intake/Output Summary (Last 24 hours) at 10/17/2022 1031 Last data filed at 10/16/2022 2042 Gross per 24 hour  Intake 240 ml  Output 200 ml  Net 40 ml    Filed Weights   10/14/22 2211 10/15/22 0942  Weight: 108.3 kg 108.3 kg    Examination:  General exam: Appears in no acute distress Respiratory system: Clear to auscultation. Respiratory effort normal. Cardiovascular system: S1 & S2 heard, RRR. No JVD, murmurs, rubs, gallops or clicks. No pedal edema. Gastrointestinal system: Abdomen is distended, soft and nontender. No  organomegaly or masses felt. Normal bowel sounds heard. Central nervous system: Alert and oriented. No focal neurological deficits. Extremities: Trace bilateral pitting edema Skin: No rashes, lesions or ulcers Psychiatry: Judgement and insight appear normal. Mood & affect appropriate.     Data Reviewed: I have personally reviewed following labs and imaging studies  CBC: Recent Labs  Lab 10/14/22 1350 10/15/22 0257 10/16/22 0315 10/17/22 0310  WBC  6.6 7.4 6.1 5.1  HGB 13.6 12.3* 12.5* 12.7*  HCT 42.3 38.4* 39.6 38.5*  MCV 102.7* 102.9* 103.7* 99.5  PLT 163 152 133* 136*    Basic Metabolic Panel: Recent Labs  Lab 10/14/22 1350 10/15/22 0829 10/16/22 0315 10/17/22 0548  NA 137 140 136 136  K 4.5 4.2 4.3 3.8  CL 102 106 101 102  CO2 27 29 28 24   GLUCOSE 106* 97 93 88  BUN 21 18 20 16   CREATININE 1.40* 1.40* 1.43* 1.44*  CALCIUM 8.8* 8.4* 8.4* 8.6*  MG  --   --  2.1  --     GFR: Estimated Creatinine Clearance: 47 mL/min (A) (by C-G formula based on SCr of 1.44 mg/dL (H)). Liver Function Tests: Recent Labs  Lab 10/14/22 1350 10/16/22 0315 10/17/22 0548  AST 37 32 50*  ALT 27 20 24   ALKPHOS 57 50 48  BILITOT 1.0 1.0 1.1  PROT 6.6 5.6* 5.8*  ALBUMIN 4.1 3.5 3.5    Recent Labs  Lab 10/14/22 1350  LIPASE 25    No results for input(s): "AMMONIA" in the last 168 hours. Coagulation Profile: No results for input(s): "INR", "PROTIME" in the last 168 hours. Cardiac Enzymes: No results for input(s): "CKTOTAL", "CKMB", "CKMBINDEX", "TROPONINI" in the last 168 hours. BNP (last 3 results) No results for input(s): "PROBNP" in the last 8760 hours. HbA1C: No results for input(s): "HGBA1C" in the last 72 hours. CBG: No results for input(s): "GLUCAP" in the last 168 hours. Lipid Profile: No results for input(s): "CHOL", "HDL", "LDLCALC", "TRIG", "CHOLHDL", "LDLDIRECT" in the last 72 hours. Thyroid Function Tests: No results for input(s): "TSH", "T4TOTAL", "FREET4", "T3FREE", "THYROIDAB" in the last 72 hours. Anemia Panel: Recent Labs    10/15/22 1145  VITAMINB12 454  FOLATE 31.9    Sepsis Labs: Recent Labs  Lab 10/14/22 1647  LATICACIDVEN 1.2     No results found for this or any previous visit (from the past 240 hour(s)).       Radiology Studies: DG Abd 1 View  Result Date: 10/16/2022 CLINICAL DATA:  [id number] Abdominal pain [id number] EXAM: ABDOMEN - 1 VIEW COMPARISON:  None Available. FINDINGS:  Nonobstructive bowel gas pattern. Moderate colonic stool burden. Nasogastric tube has been presumably removed, no longer within the field of view. Partially visualized lumbar fusion hardware and left hip arthroplasty. Pelvic phleboliths. No acute osseous abnormality. IMPRESSION: No evidence of bowel obstruction.  Moderate colonic stool burden. Electronically Signed   By: Caprice Renshaw M.D.   On: 10/16/2022 12:59        Scheduled Meds:  pantoprazole (PROTONIX) IV  40 mg Intravenous Q12H   sodium chloride flush  3 mL Intravenous Q12H   Continuous Infusions:   LOS: 3 days   Time spent: 39 min  Alwyn Ren, MD  10/17/2022, 10:31 AM

## 2022-10-17 NOTE — Progress Notes (Signed)
[facility] Progress Note  2 Days Post-Op  Subjective: CC:  Feel anxious. Had some b/l mid to lower abdominal pain this am that has resolved without intervention. Some nausea after OJ yesterday. Tried FLD today. Only took in a small amount but no abdominal pain, n/v after. Passing flatus. BM yesterday.   Afebrile. No tachycardia or hypotension. WBC wnl. Cr stable.   Objective: Vital signs in last 24 hours: Temp:  [98.3 F (36.8 C)-99.8 F (37.7 C)] 98.6 F (37 C) (05/16 0423) Pulse Rate:  [72-86] 77 (05/16 0423) Resp:  [18] 18 (05/16 0423) BP: (121-132)/(75-79) 121/75 (05/16 0423) SpO2:  [93 %-95 %] 93 % (05/16 0423) Last BM Date : 10/14/22  Intake/Output from previous day: 05/15 0701 - 05/16 0700 In: 600 [P.O.:600] Out: 400 [Urine:400] Intake/Output this shift: No intake/output data recorded.  PE: Gen:  Alert, NAD, pleasant Pulm:  Normal effort Abd: Soft, ND, mild epigastric/LUQ ttp. No rigidity or guarding. +BS  Lab Results:  Recent Labs    10/16/22 0315 10/17/22 0310  WBC 6.1 5.1  HGB 12.5* 12.7*  HCT 39.6 38.5*  PLT 133* 136*    BMET Recent Labs    10/16/22 0315 10/17/22 0548  NA 136 136  K 4.3 3.8  CL 101 102  CO2 28 24  GLUCOSE 93 88  BUN 20 16  CREATININE 1.43* 1.44*  CALCIUM 8.4* 8.6*    PT/INR No results for input(s): "LABPROT", "INR" in the last 72 hours. CMP     Component Value Date/Time   NA 136 10/17/2022 0548   NA 138 04/29/2016 0000   K 3.8 10/17/2022 0548   CL 102 10/17/2022 0548   CO2 24 10/17/2022 0548   GLUCOSE 88 10/17/2022 0548   BUN 16 10/17/2022 0548   BUN 19 04/29/2016 0000   CREATININE 1.44 (H) 10/17/2022 0548   CALCIUM 8.6 (L) 10/17/2022 0548   PROT 5.8 (L) 10/17/2022 0548   PROT 6.4 04/25/2021 0837   ALBUMIN 3.5 10/17/2022 0548   ALBUMIN 4.4 01/13/2017 0846   AST 50 (H) 10/17/2022 0548   ALT 24 10/17/2022 0548   ALKPHOS 48 10/17/2022 0548   BILITOT 1.1 10/17/2022 0548   BILITOT 0.4 01/13/2017  0846   GFRNONAA 48 (L) 10/17/2022 0548   GFRAA 59 (L) 04/14/2017 1000   Lipase     Component Value Date/Time   LIPASE 25 10/14/2022 1350       Studies/Results: DG Abd 1 View  Result Date: 10/16/2022 CLINICAL DATA:  [id number] Abdominal pain [id number] EXAM: ABDOMEN - 1 VIEW COMPARISON:  None Available. FINDINGS: Nonobstructive bowel gas pattern. Moderate colonic stool burden. Nasogastric tube has been presumably removed, no longer within the field of view. Partially visualized lumbar fusion hardware and left hip arthroplasty. Pelvic phleboliths. No acute osseous abnormality. IMPRESSION: No evidence of bowel obstruction.  Moderate colonic stool burden. Electronically Signed   By: Caprice Renshaw M.D.   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 Myrtie Neither, NGT removed during this procedure   Hiatal Hernia  - Will repeat CXR today. Cont FLD. Will check this afternoon to see how he is doing. If improving, would plan for hospital discharge with close follow up in our office to scheduled elective [facility] repair. If worsening then will need repair this admission and will reach out ot one of our foregut surgeons to see if  this could be done as early as tomorrow.  - Further updates to follow.   FEN:  FLD. PPI BID ID: none VTE: hold eliquis. Okay for heparin gtt    LOS: 3 days   I reviewed nursing notes, Consultant GI notes, hospitalist notes, last 24 h vitals and pain scores, last 48 h intake and output, last 24 h labs and trends, and last 24 h imaging results.   [name], [facility] Surgery 10/17/2022, 9:49 AM Please see Amion for pager number during day hours 7:00am-4:30pm

## 2022-10-21 DIAGNOSIS — I4819 Other persistent atrial fibrillation: Secondary | ICD-10-CM | POA: Diagnosis not present

## 2022-10-21 DIAGNOSIS — R109 Unspecified abdominal pain: Secondary | ICD-10-CM | POA: Diagnosis not present

## 2022-10-21 DIAGNOSIS — I700 Atherosclerosis of aorta: Secondary | ICD-10-CM | POA: Diagnosis not present

## 2022-10-21 DIAGNOSIS — N1831 Chronic kidney disease, stage 3a: Secondary | ICD-10-CM | POA: Diagnosis not present

## 2022-10-21 DIAGNOSIS — D6869 Other thrombophilia: Secondary | ICD-10-CM | POA: Diagnosis not present

## 2022-11-07 DIAGNOSIS — I700 Atherosclerosis of aorta: Secondary | ICD-10-CM | POA: Diagnosis not present

## 2022-11-07 DIAGNOSIS — I4819 Other persistent atrial fibrillation: Secondary | ICD-10-CM | POA: Diagnosis not present

## 2022-11-07 DIAGNOSIS — E782 Mixed hyperlipidemia: Secondary | ICD-10-CM | POA: Diagnosis not present

## 2022-11-07 DIAGNOSIS — D6869 Other thrombophilia: Secondary | ICD-10-CM | POA: Diagnosis not present

## 2022-11-07 DIAGNOSIS — I10 Essential (primary) hypertension: Secondary | ICD-10-CM | POA: Diagnosis not present

## 2022-11-07 DIAGNOSIS — N1831 Chronic kidney disease, stage 3a: Secondary | ICD-10-CM | POA: Diagnosis not present

## 2022-11-11 ENCOUNTER — Telehealth: Payer: Self-pay

## 2022-11-11 ENCOUNTER — Ambulatory Visit: Payer: Self-pay | Admitting: General Surgery

## 2022-11-11 DIAGNOSIS — K449 Diaphragmatic hernia without obstruction or gangrene: Secondary | ICD-10-CM | POA: Diagnosis not present

## 2022-11-11 NOTE — Telephone Encounter (Signed)
Pt is scheduled 06/20 at 2:40 pm

## 2022-11-11 NOTE — Telephone Encounter (Signed)
Pt is scheduled 06/20 at 2:40 pm Med rec and consent done   Patient Consent for Virtual Visit        [name] has provided verbal consent on 11/11/2022 for a virtual visit (video or telephone).   CONSENT FOR VIRTUAL VISIT FOR:  [name]  By participating in this virtual visit I agree to the following:  I hereby voluntarily request, consent and authorize [facility] and its employed or contracted physicians, physician assistants, nurse practitioners or other licensed health care professionals (the Practitioner), to provide me with telemedicine health care services (the "Services") as deemed necessary by the treating Practitioner. I acknowledge and consent to receive the Services by the Practitioner via telemedicine. I understand that the telemedicine visit will involve communicating with the Practitioner through live audiovisual communication technology and the disclosure of certain medical information by electronic transmission. I acknowledge that I have been given the opportunity to request an in-person assessment or other available alternative prior to the telemedicine visit and am voluntarily participating in the telemedicine visit.  I understand that I have the right to withhold or withdraw my consent to the use of telemedicine in the course of my care at any time, without affecting my right to future care or treatment, and that the Practitioner or I may terminate the telemedicine visit at any time. I understand that I have the right to inspect all information obtained and/or recorded in the course of the telemedicine visit and may receive copies of available information for a reasonable fee.  I understand that some of the potential risks of receiving the Services via telemedicine include:  Delay or interruption in medical evaluation due to technological equipment failure or disruption; Information transmitted may not be sufficient (e.g. poor resolution of images) to allow for  appropriate medical decision making by the Practitioner; and/or  In rare instances, security protocols could fail, causing a breach of personal health information.  Furthermore, I acknowledge that it is my responsibility to provide information about my medical history, conditions and care that is complete and accurate to the best of my ability. I acknowledge that Practitioner's advice, recommendations, and/or decision may be based on factors not within their control, such as incomplete or inaccurate data provided by me or distortions of diagnostic images or specimens that may result from electronic transmissions. I understand that the practice of medicine is not an exact science and that Practitioner makes no warranties or guarantees regarding treatment outcomes. I acknowledge that a copy of this consent can be made available to me via my patient portal [facility] MyChart), or I can request a printed copy by calling the office of  [facility].    I understand that my insurance will be billed for this visit.   I have read or had this consent read to me. I understand the contents of this consent, which adequately explains the benefits and risks of the Services being provided via telemedicine.  I have been provided ample opportunity to ask questions regarding this consent and the Services and have had my questions answered to my satisfaction. I give my informed consent for the services to be provided through the use of telemedicine in my medical care

## 2022-11-11 NOTE — Telephone Encounter (Signed)
   Pre-operative Risk Assessment    Patient Name: [name]  DOB: 1940-10-21 MRN: [id number]     Request for Surgical Clearance    Procedure:  Hernia Surgery   Date of Surgery:  Clearance TBD                                 Surgeon:  Dr. Axel Filler Surgeon's Group or Practice Name:  [facility]  Phone number:  [phone] Fax number:  [phone]   Type of Clearance Requested:   - Pharmacy:  Hold Apixaban (Eliquis)     Type of Anesthesia:  General    Additional requests/questions:    Scarlette Shorts   11/11/2022, 10:23 AM

## 2022-11-11 NOTE — Telephone Encounter (Signed)
Patient with diagnosis of afib on Eliquis for anticoagulation.    Procedure: hernia surgery Date of procedure: TBD  CHA2DS2-VASc Score = 3  This indicates a 3.2% annual risk of stroke. [name] score is based upon: CHF History: 0 HTN History: 1 Diabetes History: 0 Stroke History: 0 Vascular Disease History: 0 Age Score: 2 Gender Score: 0   CrCl 29mL/min using adj body weight Platelet count 136K  Per office protocol, patient can hold Eliquis for 2-3 days prior to procedure.    **This guidance is not considered finalized until pre-operative APP has relayed final recommendations.**

## 2022-11-11 NOTE — H&P (Signed)
Chief Complaint: New Problem (Hiatal Hernia)       History of Present Illness: [name] is a 82 y.o. male who is seen today as an office consultation at the request of Dr. Randell Patient for evaluation of New Problem (Hiatal Hernia) .     Patient is an 82 year old male, who comes in secondary to a history of gastric volvulus, hiatal hernia.  Patient also has a history of A-fib-on Eliquis.  Patient sees Dr. Swaziland his cardiologist.   Patient was recently admitted to the hospital secondary to epigastric abdominal pain.  This came on abruptly while eating.  He states he was tract in the hospital or after.  He was seen by our EGS team and was found to have gastric volvulus.  Patient underwent NG tube decompression and endoscopy.  There is no signs of continued volvulus at the time.  He did well and was discharged.   He states that since that time he has not had any further episodes similar to his pain and discomfort due to the volvulus.   Patient's had no previous abdominal surgery.   Patient did have a CT scan in the hospital and was found to have a large hiatal hernia at approximately 6 cm.     Review of Systems: A complete review of systems was obtained from the patient.  I have reviewed this information and discussed as appropriate with the patient.  See HPI as well for other ROS.   Review of Systems  Constitutional:  Negative for fever.  HENT:  Negative for congestion.   Eyes:  Negative for blurred vision.  Respiratory:  Negative for cough, shortness of breath and wheezing.   Cardiovascular:  Negative for chest pain and palpitations.  Gastrointestinal:  Negative for heartburn.  Genitourinary:  Negative for dysuria.  Musculoskeletal:  Negative for myalgias.  Skin:  Negative for rash.  Neurological:  Negative for dizziness and headaches.  Psychiatric/Behavioral:  Negative for depression and suicidal ideas.   All other systems reviewed and are negative.       Medical History: Past  Medical History Past Medical History: Diagnosis Date  GERD (gastroesophageal reflux disease)    Hyperlipidemia         Problem List There is no problem list on file for this patient.     Past Surgical History History reviewed. No pertinent surgical history.     Allergies Allergies Allergen Reactions  Compazine [Prochlorperazine Edisylate] Other (See Comments)     Involuntary muscle movement  Gemfibrozil Nausea  Penicillins Nausea, Rash and Vomiting  Red Yeast Rice Other (See Comments)     Flushing    Welchol [Colesevelam] Other (See Comments)     Leg aches        Medications Ordered Prior to Encounter Current Outpatient Medications on File Prior to Visit Medication Sig Dispense Refill  acetaminophen (TYLENOL) 325 MG tablet Take 650 mg by mouth every 6 (six) hours as needed      bisacodyL (DULCOLAX) 5 mg EC tablet TAKE TWO TABLETS BY MOUTH DAILY AS NEEDED FOR moderate constipation      carvediloL (COREG) 3.125 MG tablet Take 3.125 mg by mouth every evening      clonazePAM (KLONOPIN) 0.5 MG tablet take 1/2-1 TABLET BY MOUTH EVERYDAY AT BEDTIME AS NEEDED      ELIQUIS 5 mg tablet Take 5 mg by mouth 2 (two) times daily      FEROSUL 325 mg (65 mg iron) tablet Take 325 mg by mouth once daily  multivitamin with iron/minerals (THERADEX-M) 27-0.4 mg tablet Take 1 tablet by mouth once daily      pantoprazole (PROTONIX) 40 MG DR tablet TAKE ONE TABLET BY MOUTH twice A DAY FOUR DAYS A WEEK alternating with ONE TABLET daily THREE DAYS A WEEK      polyethylene glycol (MIRALAX) packet Take 17 g by mouth once daily as needed      pregabalin (LYRICA) 100 MG capsule TAKE FOUR CAPSULES BY MOUTH AT BEDTIME      rosuvastatin (CRESTOR) 10 MG tablet Take 10 mg by mouth every evening      tamsulosin (FLOMAX) 0.4 mg capsule Take 0.4 mg by mouth every morning      traMADoL (ULTRAM) 50 mg tablet Take 50 mg by mouth every 6 (six) hours as needed        No current facility-administered  medications on file prior to visit.      Family History Family History Problem Relation Age of Onset  High blood pressure (Hypertension) Mother    Obesity Mother    Coronary Artery Disease (Blocked arteries around heart) Brother        Tobacco Use History Social History    Tobacco Use Smoking Status Former  Types: Cigarettes, Pipe Smokeless Tobacco Never      Social History Social History    Socioeconomic History  Marital status: Widowed Tobacco Use  Smoking status: Former     Types: Cigarettes, Pipe  Smokeless tobacco: Never Vaping Use  Vaping status: Never Used Substance and Sexual Activity  Alcohol use: Yes  Drug use: Never    Social Determinants of Health    Financial Resource Strain: Low Risk  (10/14/2022)   Received from [facility]   Overall Financial Resource Strain (CARDIA)    Difficulty of Paying Living Expenses: Not hard at all Food Insecurity: No Food Insecurity (10/14/2022)   Received from [facility]   Hunger Vital Sign    Worried About Running Out of Food in the Last Year: Never true    Ran Out of Food in the Last Year: Never true Transportation Needs: No Transportation Needs (10/14/2022)   Received from [facility] - Transportation    Lack of Transportation (Medical): No    Lack of Transportation (Non-Medical): No Physical Activity: Sufficiently Active (10/14/2022)   Received from [facility]   Exercise Vital Sign    Days of Exercise per Week: 5 days    Minutes of Exercise per Session: 30 min Stress: No Stress Concern Present (10/14/2022)   Received from [facility] of Occupational Health - Occupational Stress Questionnaire    Feeling of Stress : Not at all Social Connections: Moderately Integrated (10/14/2022)   Received from [facility]   Social Connection and Isolation Panel [NHANES]    Frequency of Communication with Friends and Family: Twice a week    Frequency of Social Gatherings with Friends and  Family: Once a week    Attends Religious Services: More than 4 times per year    Active Member of Clubs or Organizations: No    Attends Banker Meetings: Never    Marital Status: Living with partner      Objective:     Vitals:   11/11/22 0926 BP: (!) 132/92 Pulse: 86 SpO2: 98% Weight: (!) 107.5 kg (237 lb) Height: 177.8 cm (5\' 10" ) PainSc: 0-No pain   Body mass index is 34.01 kg/m.   Physical Exam Constitutional:      General:  He is not in acute distress.    Appearance: Normal appearance.  HENT:     Head: Normocephalic.     Nose: No rhinorrhea.     Mouth/Throat:     Mouth: Mucous membranes are moist.     Pharynx: Oropharynx is clear.  Eyes:     General: No scleral icterus.    Pupils: Pupils are equal, round, and reactive to light.  Cardiovascular:     Rate and Rhythm: Normal rate.     Pulses: Normal pulses.  Pulmonary:     Effort: Pulmonary effort is normal. No respiratory distress.     Breath sounds: No stridor. No wheezing.  Abdominal:     General: Abdomen is flat. There is no distension.     Tenderness: There is no abdominal tenderness. There is no guarding or rebound.  Musculoskeletal:        General: Normal range of motion.     Cervical back: Normal range of motion and neck supple.  Skin:    General: Skin is warm and dry.     Capillary Refill: Capillary refill takes less than 2 seconds.     Coloration: Skin is not jaundiced.  Neurological:     General: No focal deficit present.     Mental Status: He is alert and oriented to person, place, and time. Mental status is at baseline.  Psychiatric:        Mood and Affect: Mood normal.        Thought Content: Thought content normal.        Judgment: Judgment normal.      Assessment and Plan: Diagnoses and all orders for this visit:   Diaphragmatic hernia without obstruction or gangrene     [name] is a 82 y.o. male    1.  We will proceed to the OR for a robotic hiatal hernia repair  with mesh and fundoplication 2. All risks and benefits were discussed with the patient, to generally include infection, bleeding, damage to surrounding structures, possible pneumothorax, and recurrence. Alternatives were offered and described.  All questions were answered and the patient voiced understanding of the procedure and wishes to proceed at this point.

## 2022-11-11 NOTE — Telephone Encounter (Signed)
   Name: [name]  DOB: 09-13-1940  MRN: [id number]  Primary Cardiologist: Peter Swaziland, MD  Chart reviewed as part of pre-operative protocol coverage. Because of [name]'s past medical history and time since last visit, he will require a follow-up telephone visit in order to better assess preoperative cardiovascular risk.  Pre-op covering staff: - Please schedule appointment and call patient to inform them. If patient already had an upcoming appointment within acceptable timeframe, please add "pre-op clearance" to the appointment notes so provider is aware. - Please contact requesting surgeon's office via preferred method (i.e, phone, fax) to inform them of need for appointment prior to surgery.   Per office protocol, patient can hold Eliquis for 2-3 days prior to procedure.    Sharlene Dory, PA-C  11/11/2022, 12:23 PM

## 2022-11-18 DIAGNOSIS — R5381 Other malaise: Secondary | ICD-10-CM | POA: Diagnosis not present

## 2022-11-18 DIAGNOSIS — M6281 Muscle weakness (generalized): Secondary | ICD-10-CM | POA: Diagnosis not present

## 2022-11-21 ENCOUNTER — Ambulatory Visit: Payer: Medicare Other | Attending: Cardiology

## 2022-11-21 DIAGNOSIS — I4819 Other persistent atrial fibrillation: Secondary | ICD-10-CM | POA: Diagnosis not present

## 2022-11-21 DIAGNOSIS — I10 Essential (primary) hypertension: Secondary | ICD-10-CM | POA: Diagnosis not present

## 2022-11-21 DIAGNOSIS — N1831 Chronic kidney disease, stage 3a: Secondary | ICD-10-CM | POA: Diagnosis not present

## 2022-11-21 DIAGNOSIS — K219 Gastro-esophageal reflux disease without esophagitis: Secondary | ICD-10-CM | POA: Diagnosis not present

## 2022-11-21 DIAGNOSIS — Z01810 Encounter for preprocedural cardiovascular examination: Secondary | ICD-10-CM

## 2022-11-21 DIAGNOSIS — E782 Mixed hyperlipidemia: Secondary | ICD-10-CM | POA: Diagnosis not present

## 2022-11-21 NOTE — Progress Notes (Signed)
Virtual Visit via Telephone Note   Because of [name]'s co-morbid illnesses, he is at least at moderate risk for complications without adequate follow up.  This format is felt to be most appropriate for this patient at this time.  The patient did not have access to video technology/had technical difficulties with video requiring transitioning to audio format only (telephone).  All issues noted in this document were discussed and addressed.  No physical exam could be performed with this format.  Please refer to the patient's chart for his consent to telehealth for [name].  Evaluation Performed:  Preoperative cardiovascular risk assessment _____________   Date:  11/21/2022   Patient ID:  [name], DOB 08-20-1940, MRN [id number] Patient Location:  Home Provider location:   Office  Primary Care Provider:  [name], MD Primary Cardiologist:  Peter Swaziland, MD  Chief Complaint / Patient Profile   82 y.o. y/o male with a h/o atrial fibrillation, hyperlipidemia, HTN who is pending hernia surgery and presents today for telephonic preoperative cardiovascular risk assessment.  History of Present Illness    [name] is a 82 y.o. male who presents via audio/video conferencing for a telehealth visit today.  Pt was last seen in cardiology clinic on 07/15/2022 by Dr. Swaziland.  At that time [name] was doing well .  The patient is now pending procedure as outlined above. Since his last visit, he remains stable from a cardiac standpoint.  Today he denies chest pain, shortness of breath, lower extremity edema, fatigue, palpitations, melena, hematuria, hemoptysis, diaphoresis, weakness, presyncope, syncope, orthopnea, and PND.   Past Medical History    Past Medical History:  Diagnosis Date   Anginal pain (HCC)    NONE IN 3 YEARS   Arthritis    Atrial fibrillation (HCC) 1962   a. No recurrence since 1962.   Chest pain    a. 1998 Cath: nl cors;  b. 2009 Cath:  nl cors.   Depression    Dysrhythmia    NO TROUBLE IN 1 YR    DR. PETER Swaziland    Esophageal reflux    Essential hypertension    History of Paroxysmal atrial flutter (HCC)    Hyperlipidemia    Incontinence of urine    Leg pain, bilateral    Nocturia    PONV (postoperative nausea and vomiting)    CONSTIPATED   Scarlet fever 1945   Spinal stenosis    Staph skin infection    LEFT GROIN  04/11/16  TX W/ DOXYCYCLINE   Ulcerative proctitis (HCC)    Past Surgical History:  Procedure Laterality Date   ANKLE RECONSTRUCTION Right 2007   [place]   ANTERIOR LAT LUMBAR FUSION Right 04/18/2016   Procedure: RIGHT LUMBAR THREE-FOUR, LUMBAR FOUR-FIVE ANTEROLATERAL LUMBAR INTERBODY FUSION;  Surgeon: Maeola Harman, MD;  Location: [facility] OR;  Service: Neurosurgery;  Laterality: Right;  RIGHT L3-4 L4-5 ANTEROLATERAL LUMBAR INTERBODY FUSION   BIOPSY  12/16/2020   Procedure: BIOPSY;  Surgeon: Kerin Salen, MD;  Location: [facility];  Service: Gastroenterology;;   CARDIAC CATHETERIZATION  03/03/2008   Left heart cardiac catheterization and coronary   CARDIAC CATHETERIZATION  1997   Dr Donnie Aho   CATARACT EXTRACTION W/ INTRAOCULAR LENS  IMPLANT, BILATERAL     2016   ESOPHAGEAL DILATION     2008   ESOPHAGOGASTRODUODENOSCOPY N/A 12/16/2020   Procedure: ESOPHAGOGASTRODUODENOSCOPY (EGD);  Surgeon: Kerin Salen, MD;  Location: [facility];  Service: Gastroenterology;  Laterality: N/A;  ESOPHAGOGASTRODUODENOSCOPY (EGD) WITH PROPOFOL N/A 10/15/2022   Procedure: ESOPHAGOGASTRODUODENOSCOPY (EGD) WITH PROPOFOL;  Surgeon: Sherrilyn Rist, MD;  Location: [facility] ENDOSCOPY;  Service: Gastroenterology;  Laterality: N/A;   INNER EAR SURGERY     EAR INJECTION FOR MENIERES   KNEE ARTHROSCOPY  1991, 1995   LUMBAR LAMINECTOMY  12/2010   LUMBAR LAMINECTOMY/DECOMPRESSION MICRODISCECTOMY Left 08/15/2016   Procedure: Left Left three- four Redo laminectomy;  Surgeon: Maeola Harman, MD;  Location: [facility] OR;  Service: Neurosurgery;   Laterality: Left;  Left L3-4 Redo laminectomy   LUMBAR PERCUTANEOUS PEDICLE SCREW 2 LEVEL N/A 04/18/2016   Procedure: LUMBAR THREE-FOUR, LUMBAR FOUR-FIVE PERCUTANEOUS PEDICLE SCREW;  Surgeon: Maeola Harman, MD;  Location: [facility] OR;  Service: Neurosurgery;  Laterality: N/A;   TOTAL HIP ARTHROPLASTY Left 04/22/2017   TOTAL HIP ARTHROPLASTY Left 04/22/2017   Procedure: TOTAL HIP ARTHROPLASTY ANTERIOR APPROACH;  Surgeon: Marcene Corning, MD;  Location: [facility] OR;  Service: Orthopedics;  Laterality: Left;   TOTAL KNEE ARTHROPLASTY Right 11/21/2020   Procedure: RIGHT TOTAL KNEE ARTHROPLASTY;  Surgeon: Marcene Corning, MD;  Location: [facility] ORS;  Service: Orthopedics;  Laterality: Right;    Allergies  Allergies  Allergen Reactions   Compazine [Prochlorperazine Edisylate] Other (See Comments)    EXTRAPYRAMIDAL MOVEMENT [Involuntary muscle movement]   Penicillins Nausea And Vomiting, Rash and Other (See Comments)    Has patient had a PCN reaction causing immediate rash, facial/tongue/throat swelling, SOB or lightheadedness with hypotension: #  #  #  YES  #  #  #  Has patient had a PCN reaction causing severe rash involving mucus membranes or skin necrosis: No Has patient had a PCN reaction that required hospitalization No Has patient had a PCN reaction occurring within the last 10 years: No If all of the above answers are "NO", then may proceed with Cephalosporin use.    Welchol [Colesevelam Hcl] Other (See Comments)    MYALGIAS [Leg aches]   Gemfibrozil Nausea Only    PATIENT TOLERATES   Red Yeast Rice [Cholestin] Other (See Comments)    flushing    Home Medications    Prior to Admission medications   Medication Sig Start Date End Date Taking? Authorizing Provider  acetaminophen (TYLENOL) 325 MG tablet Take 2 tablets (650 mg total) by mouth every 6 (six) hours as needed for mild pain (or Fever >/= 101). 12/18/20   Lonia Blood, MD  bisacodyl (DULCOLAX) 5 MG EC tablet Take 2 tablets (10 mg total)  by mouth daily as needed for moderate constipation. 10/17/22 10/17/23  Alwyn Ren, MD  carvedilol (COREG) 3.125 MG tablet Take 3.125 mg by mouth at bedtime.     [provider]  clonazePAM (KLONOPIN) 0.5 MG tablet take 1/2-1 TABLET BY MOUTH EVERYDAY AT BEDTIME AS NEEDED Patient taking differently: Take 0.25-0.5 mg by mouth at bedtime as needed for anxiety. take 1/2-1 TABLET BY MOUTH EVERYDAY AT BEDTIME AS NEEDED 02/06/22   Glean Salvo, NP  DULoxetine (CYMBALTA) 60 MG capsule Take 1 capsule (60 mg total) by mouth daily. 09/18/22   Glean Salvo, NP  ELIQUIS 5 MG TABS tablet Take 1 tablet (5 mg total) by mouth 2 (two) times daily. 07/15/22   [place], Peter M, MD  ferrous sulfate 324 (65 Fe) MG TBEC Take 324 mg by mouth daily.    [provider]  Multiple Vitamins-Minerals (MULTIVITAMIN PO) Take 1 tablet by mouth daily.    [provider]  mupirocin ointment (BACTROBAN) 2 % Apply topically 2 (  two) times daily. 11/07/22   [provider]  nitroGLYCERIN (NITROSTAT) 0.4 MG SL tablet Place 0.4 mg under the tongue every 5 (five) minutes as needed for chest pain.    [provider]  pantoprazole (PROTONIX) 40 MG tablet Take 1 tablet (40 mg total) by mouth 2 (two) times daily before a meal. Patient taking differently: Take 40 mg by mouth 2 (two) times daily before a meal. Take 1 tablet by mouth twice a day 4d/week alternating with 1 tablet once a day 3d/week 12/18/20   Lonia Blood, MD  polyethylene glycol (MIRALAX / Ethelene Hal) packet Take 17 g by mouth daily as needed for mild constipation.    [provider]  pregabalin (LYRICA) 100 MG capsule TAKE FOUR CAPSULES BY MOUTH AT bedtime Patient taking differently: Take 400 mg by mouth at bedtime. 09/02/22   Glean Salvo, NP  rOPINIRole (REQUIP) 0.5 MG tablet Take 1 tablet at 6 PM, take 1 tablet at bedtime Patient taking differently: Take 0.5 mg by mouth at bedtime. Take 1 tablet at 6 PM, take 1 tablet  at bedtime 09/18/22   Glean Salvo, NP  rosuvastatin (CRESTOR) 10 MG tablet TAKE 1 TABLET ONCE DAILY OR AS DIRECTED. Patient taking differently: Take 10 mg by mouth daily. 04/22/17   [place], Peter M, MD  tamsulosin (FLOMAX) 0.4 MG CAPS capsule Take 0.4 mg by mouth daily. 12/08/18   [provider]  traMADol (ULTRAM) 50 MG tablet Take 1 tablet (50 mg total) by mouth every 6 (six) hours as needed for moderate pain. 12/18/20   Lonia Blood, MD    Physical Exam    Vital Signs:  Bertram Millard does not have vital signs available for review today.  Given telephonic nature of communication, physical exam is limited. AAOx3. NAD. Normal affect.  Speech and respirations are unlabored.  Accessory Clinical Findings    None  Assessment & Plan    1.  Preoperative Cardiovascular Risk Assessment: Hernia repair Dr. Derrell Lolling, [place] surgery, [phone]      Primary Cardiologist: Peter Swaziland, MD  Chart reviewed as part of pre-operative protocol coverage. Given past medical history and time since last visit, based on ACC/AHA guidelines, NIHAL RHUE would be at acceptable risk for the planned procedure without further cardiovascular testing.   His RCRI is a class I risk, 0.4% risk of major cardiac event.  He is able to complete greater than 4 METS of physical activity.  Patient with diagnosis of afib on Eliquis for anticoagulation.     Procedure: hernia surgery Date of procedure: TBD   CHA2DS2-VASc Score = 3  This indicates a 3.2% annual risk of stroke. [name] score is based upon: CHF History: 0 HTN History: 1 Diabetes History: 0 Stroke History: 0 Vascular Disease History: 0 Age Score: 2 Gender Score: 0   CrCl 81mL/min using adj body weight Platelet count 136K   Per office protocol, patient can hold Eliquis for 2-3 days prior to procedure.  Patient was advised that if he develops new symptoms prior to surgery to contact our office to arrange a follow-up  appointment.  He verbalized understanding.  I will route this recommendation to the requesting party via Epic fax function and remove from pre-op pool.       Time:   Today, I have spent 5 minutes with the patient with telehealth technology discussing medical history, symptoms, and management plan.  Prior to his phone evaluation I spent greater than 10 minutes  reviewing his past medical history and cardiac medications.   Ronney Asters, NP  11/21/2022, 8:25 AM

## 2022-11-24 ENCOUNTER — Emergency Department (HOSPITAL_COMMUNITY)
Admission: EM | Admit: 2022-11-24 | Discharge: 2022-11-24 | Disposition: A | Discharge status: Home / Self Care | Payer: Medicare Other | Attending: Emergency Medicine | Admitting: Emergency Medicine

## 2022-11-24 ENCOUNTER — Encounter (HOSPITAL_COMMUNITY): Payer: Self-pay | Admitting: Emergency Medicine

## 2022-11-24 ENCOUNTER — Emergency Department (HOSPITAL_COMMUNITY): Payer: Medicare Other

## 2022-11-24 ENCOUNTER — Other Ambulatory Visit: Payer: Self-pay

## 2022-11-24 DIAGNOSIS — I7121 Aneurysm of the ascending aorta, without rupture: Secondary | ICD-10-CM | POA: Diagnosis not present

## 2022-11-24 DIAGNOSIS — K429 Umbilical hernia without obstruction or gangrene: Secondary | ICD-10-CM | POA: Diagnosis not present

## 2022-11-24 DIAGNOSIS — K5730 Diverticulosis of large intestine without perforation or abscess without bleeding: Secondary | ICD-10-CM | POA: Diagnosis not present

## 2022-11-24 DIAGNOSIS — K449 Diaphragmatic hernia without obstruction or gangrene: Secondary | ICD-10-CM | POA: Diagnosis not present

## 2022-11-24 DIAGNOSIS — R109 Unspecified abdominal pain: Secondary | ICD-10-CM | POA: Diagnosis not present

## 2022-11-24 DIAGNOSIS — R1013 Epigastric pain: Secondary | ICD-10-CM

## 2022-11-24 DIAGNOSIS — R6889 Other general symptoms and signs: Secondary | ICD-10-CM | POA: Diagnosis not present

## 2022-11-24 LAB — COMPREHENSIVE METABOLIC PANEL
ALT: 25 U/L (ref 0–44)
AST: 32 U/L (ref 15–41)
Albumin: 3.9 g/dL (ref 3.5–5.0)
Alkaline Phosphatase: 56 U/L (ref 38–126)
Anion gap: 11 (ref 5–15)
BUN: 16 mg/dL (ref 8–23)
CO2: 26 mmol/L (ref 22–32)
Calcium: 9 mg/dL (ref 8.9–10.3)
Chloride: 101 mmol/L (ref 98–111)
Creatinine, Ser: 1.36 mg/dL — ABNORMAL HIGH (ref 0.61–1.24)
GFR, Estimated: 51 mL/min — ABNORMAL LOW (ref >60)
Glucose, Bld: 110 mg/dL — ABNORMAL HIGH (ref 70–99)
Potassium: 4.4 mmol/L (ref 3.5–5.1)
Sodium: 138 mmol/L (ref 135–145)
Total Bilirubin: 1 mg/dL (ref 0.3–1.2)
Total Protein: 6.2 g/dL — ABNORMAL LOW (ref 6.5–8.1)

## 2022-11-24 LAB — CBC
HCT: 42.2 % (ref 39.0–52.0)
Hemoglobin: 13.7 g/dL (ref 13.0–17.0)
MCH: 33.2 pg (ref 26.0–34.0)
MCHC: 32.5 g/dL (ref 30.0–36.0)
MCV: 102.2 fL — ABNORMAL HIGH (ref 80.0–100.0)
Platelets: 152 10*3/uL (ref 150–400)
RBC: 4.13 MIL/uL — ABNORMAL LOW (ref 4.22–5.81)
RDW: 13.8 % (ref 11.5–15.5)
WBC: 5.5 10*3/uL (ref 4.0–10.5)
nRBC: 0 % (ref 0.0–0.2)

## 2022-11-24 LAB — URINALYSIS, ROUTINE W REFLEX MICROSCOPIC
Bilirubin Urine: NEGATIVE
Glucose, UA: NEGATIVE mg/dL
Hgb urine dipstick: NEGATIVE
Ketones, ur: NEGATIVE mg/dL
Leukocytes,Ua: NEGATIVE
Nitrite: NEGATIVE
Protein, ur: NEGATIVE mg/dL
Specific Gravity, Urine: 1.014 (ref 1.005–1.030)
pH: 6 (ref 5.0–8.0)

## 2022-11-24 LAB — TROPONIN I (HIGH SENSITIVITY)
Troponin I (High Sensitivity): 5 ng/L (ref <18)
Troponin I (High Sensitivity): 5 ng/L (ref <18)

## 2022-11-24 LAB — LIPASE, BLOOD: Lipase: 24 U/L (ref 11–51)

## 2022-11-24 MED ORDER — PANTOPRAZOLE SODIUM 40 MG IV SOLR
40.0000 mg | Freq: Once | INTRAVENOUS | Status: AC
  Administered 2022-11-24: 40 mg via INTRAVENOUS
  Filled 2022-11-24: qty 10

## 2022-11-24 MED ORDER — HYDROCODONE-ACETAMINOPHEN 5-325 MG PO TABS: 1.0000 | ORAL_TABLET | Freq: Four times a day (QID) | ORAL | 0 refills | Status: DC | PRN

## 2022-11-24 MED ORDER — IOHEXOL 350 MG/ML SOLN
100.0000 mL | Freq: Once | INTRAVENOUS | Status: AC | PRN
  Administered 2022-11-24: 100 mL via INTRAVENOUS

## 2022-11-24 MED ORDER — MORPHINE SULFATE (PF) 4 MG/ML IV SOLN
4.0000 mg | Freq: Once | INTRAVENOUS | Status: AC
  Administered 2022-11-24: 4 mg via INTRAVENOUS
  Filled 2022-11-24: qty 1

## 2022-11-24 MED ORDER — DOCUSATE SODIUM 100 MG PO CAPS: 100.0000 mg | ORAL_CAPSULE | Freq: Two times a day (BID) | ORAL | 0 refills | Status: DC

## 2022-11-24 MED ORDER — SUCRALFATE 1 G PO TABS: 1.0000 g | ORAL_TABLET | Freq: Three times a day (TID) | ORAL | 0 refills | Status: DC

## 2022-11-24 MED ORDER — ONDANSETRON HCL 4 MG/2ML IJ SOLN
4.0000 mg | Freq: Once | INTRAMUSCULAR | Status: AC
  Administered 2022-11-24: 4 mg via INTRAVENOUS
  Filled 2022-11-24: qty 2

## 2022-11-24 NOTE — ED Provider Notes (Signed)
EMERGENCY DEPARTMENT AT [facility] Provider Note   CSN: [id number] Arrival date & time: 11/24/22  1454     History  Chief Complaint  Patient presents with   Abdominal Pain    [name] is a 82 y.o. male.   Abdominal Pain    Patient has history of spinal stenosis esophageal reflux, hiatal hernia with a gastric volvulus who was recently admitted to the hospital last month for a gastric volvulus.  Patient underwent an upper endoscopy with ultimate resolution of the gastric volvulus.  He was evaluated by general surgery.  Plan was for outpatient surgery.  Patient states he had been doing well until today when he started to experiencing the same symptoms he has had before.  He is having sharp pain in his chest and upper abdomen.  Radiates to his shoulder.  He is nauseated but has not vomited.  He has not had any diarrhea or dysuria.  Home Medications Prior to Admission medications   Medication Sig Start Date End Date Taking? Authorizing Provider  docusate sodium (COLACE) 100 MG capsule Take 1 capsule (100 mg total) by mouth every 12 (twelve) hours. 11/24/22  Yes Linwood Dibbles, MD  HYDROcodone-acetaminophen (NORCO/VICODIN) 5-325 MG tablet Take 1 tablet by mouth every 6 (six) hours as needed. 11/24/22  Yes Linwood Dibbles, MD  sucralfate (CARAFATE) 1 g tablet Take 1 tablet (1 g total) by mouth 4 (four) times daily -  with meals and at bedtime. 11/24/22  Yes Linwood Dibbles, MD  acetaminophen (TYLENOL) 325 MG tablet Take 2 tablets (650 mg total) by mouth every 6 (six) hours as needed for mild pain (or Fever >/= 101). 12/18/20   Lonia Blood, MD  bisacodyl (DULCOLAX) 5 MG EC tablet Take 2 tablets (10 mg total) by mouth daily as needed for moderate constipation. 10/17/22 10/17/23  Alwyn Ren, MD  carvedilol (COREG) 3.125 MG tablet Take 3.125 mg by mouth at bedtime.     [provider]  clonazePAM (KLONOPIN) 0.5 MG tablet take 1/2-1 TABLET BY MOUTH EVERYDAY AT  BEDTIME AS NEEDED Patient taking differently: Take 0.25-0.5 mg by mouth at bedtime as needed for anxiety. take 1/2-1 TABLET BY MOUTH EVERYDAY AT BEDTIME AS NEEDED 02/06/22   Glean Salvo, NP  DULoxetine (CYMBALTA) 60 MG capsule Take 1 capsule (60 mg total) by mouth daily. 09/18/22   Glean Salvo, NP  ELIQUIS 5 MG TABS tablet Take 1 tablet (5 mg total) by mouth 2 (two) times daily. 07/15/22   [place], Peter M, MD  ferrous sulfate 324 (65 Fe) MG TBEC Take 324 mg by mouth daily.    [provider]  Multiple Vitamins-Minerals (MULTIVITAMIN PO) Take 1 tablet by mouth daily.    [provider]  mupirocin ointment (BACTROBAN) 2 % Apply topically 2 (two) times daily. 11/07/22   [provider]  nitroGLYCERIN (NITROSTAT) 0.4 MG SL tablet Place 0.4 mg under the tongue every 5 (five) minutes as needed for chest pain.    [provider]  pantoprazole (PROTONIX) 40 MG tablet Take 1 tablet (40 mg total) by mouth 2 (two) times daily before a meal. Patient taking differently: Take 40 mg by mouth 2 (two) times daily before a meal. Take 1 tablet by mouth twice a day 4d/week alternating with 1 tablet once a day 3d/week 12/18/20   Lonia Blood, MD  polyethylene glycol (MIRALAX / Ethelene Hal) packet Take 17 g by mouth daily as needed for mild constipation.  [provider]  pregabalin (LYRICA) 100 MG capsule TAKE FOUR CAPSULES BY MOUTH AT bedtime Patient taking differently: Take 400 mg by mouth at bedtime. 09/02/22   Glean Salvo, NP  rOPINIRole (REQUIP) 0.5 MG tablet Take 1 tablet at 6 PM, take 1 tablet at bedtime Patient taking differently: Take 0.5 mg by mouth at bedtime. Take 1 tablet at 6 PM, take 1 tablet at bedtime 09/18/22   Glean Salvo, NP  rosuvastatin (CRESTOR) 10 MG tablet TAKE 1 TABLET ONCE DAILY OR AS DIRECTED. Patient taking differently: Take 10 mg by mouth daily. 04/22/17   [place], Peter M, MD  tamsulosin (FLOMAX) 0.4 MG CAPS capsule Take 0.4 mg by mouth  daily. 12/08/18   [provider]  traMADol (ULTRAM) 50 MG tablet Take 1 tablet (50 mg total) by mouth every 6 (six) hours as needed for moderate pain. 12/18/20   Lonia Blood, MD      Allergies    Compazine [prochlorperazine edisylate], Penicillins, Welchol [colesevelam hcl], Gemfibrozil, and Red yeast rice [cholestin]    Review of Systems   Review of Systems  Gastrointestinal:  Positive for abdominal pain.    Physical Exam Updated Vital Signs BP (!) 109/90   Pulse (!) 56   Temp 97.8 F (36.6 C) (Oral)   Resp 12   Ht 1.778 m (5\' 10" )   Wt 108 kg   SpO2 97%   BMI 34.15 kg/m  Physical Exam Vitals and nursing note reviewed.  Constitutional:      Appearance: He is well-developed. He is ill-appearing.  HENT:     Head: Normocephalic and atraumatic.     Right Ear: External ear normal.     Left Ear: External ear normal.  Eyes:     General: No scleral icterus.       Right eye: No discharge.        Left eye: No discharge.     Conjunctiva/sclera: Conjunctivae normal.  Neck:     Trachea: No tracheal deviation.  Cardiovascular:     Rate and Rhythm: Normal rate and regular rhythm.  Pulmonary:     Effort: Pulmonary effort is normal. No respiratory distress.     Breath sounds: Normal breath sounds. No stridor. No wheezing or rales.  Abdominal:     General: Bowel sounds are normal. There is no distension.     Palpations: Abdomen is soft.     Tenderness: There is abdominal tenderness in the epigastric area. There is no guarding or rebound.  Musculoskeletal:        General: No tenderness or deformity.     Cervical back: Neck supple.  Skin:    General: Skin is warm and dry.     Findings: No rash.  Neurological:     General: No focal deficit present.     Mental Status: He is alert.     Cranial Nerves: No cranial nerve deficit, dysarthria or facial asymmetry.     Sensory: No sensory deficit.     Motor: No abnormal muscle tone or seizure activity.     Coordination:  Coordination normal.  Psychiatric:        Mood and Affect: Mood normal.     ED Results / Procedures / Treatments   Labs (all labs ordered are listed, but only abnormal results are displayed) Labs Reviewed  COMPREHENSIVE METABOLIC PANEL - Abnormal; Notable for the following components:      Result Value   Glucose, Bld 110 (*)    Creatinine,  Ser 1.36 (*)    Total Protein 6.2 (*)    GFR, Estimated 51 (*)    All other components within normal limits  CBC - Abnormal; Notable for the following components:   RBC 4.13 (*)    MCV 102.2 (*)    All other components within normal limits  URINALYSIS, ROUTINE W REFLEX MICROSCOPIC - Abnormal; Notable for the following components:   APPearance HAZY (*)    All other components within normal limits  LIPASE, BLOOD  TROPONIN I (HIGH SENSITIVITY)  TROPONIN I (HIGH SENSITIVITY)    EKG EKG Interpretation  Date/Time:  Sunday November 24 2022 15:46:55 EDT Ventricular Rate:  63 PR Interval:    QRS Duration: 102 QT Interval:  393 QTC Calculation: 403 R Axis:   -39 Text Interpretation: Atrial fibrillation Left axis deviation Borderline low voltage, extremity leads RSR' in V1 or V2, probably normal variant No significant change since last tracing Confirmed by Linwood Dibbles [phone]) on 11/24/2022 4:07:01 PM  Radiology CT CHEST ABDOMEN PELVIS W CONTRAST  Result Date: 11/24/2022 CLINICAL DATA:  hiatal hernia, hx volvulus EXAM: CT CHEST, ABDOMEN, AND PELVIS WITH CONTRAST TECHNIQUE: Multidetector CT imaging of the chest, abdomen and pelvis was performed following the standard protocol during bolus administration of intravenous contrast. RADIATION DOSE REDUCTION: This exam was performed according to the departmental dose-optimization program which includes automated exposure control, adjustment of the mA and/or kV according to patient size and/or use of iterative reconstruction technique. CONTRAST:  OMNIPAQUE IOHEXOL 350 MG/ML SOLN COMPARISON:  CT abdomen  pelvis 12/15/2020, CT angio chest 11/27/2020 FINDINGS: CT CHEST FINDINGS Cardiovascular: Prominent heart size. No significant pericardial effusion. 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 Mild atherosclerotic plaque of the thoracic aorta. At least 2 vessel coronary artery calcifications. No central or segmental pulmonary embolus. Mediastinum/Nodes: No enlarged mediastinal, hilar, or axillary lymph nodes. Thyroid gland, trachea, and esophagus demonstrate no significant findings. Large hiatal hernia containing the entire stomach. Lungs/Pleura: Left lower lobe passive atelectasis. No focal consolidation. No pulmonary nodule. No pulmonary mass. No pleural effusion. No pneumothorax. Musculoskeletal: No chest wall abnormality. No suspicious lytic or blastic osseous lesions. No acute displaced fracture. Multilevel degenerative changes of the spine. CT ABDOMEN PELVIS FINDINGS Hepatobiliary: No focal liver abnormality. No gallstones, gallbladder wall thickening, or pericholecystic fluid. No biliary dilatation. Pancreas: No focal lesion. Normal pancreatic contour. No surrounding inflammatory changes. No main pancreatic ductal dilatation. Spleen: Normal in size without focal abnormality.  Fluid Adrenals/Urinary Tract: No adrenal nodule bilaterally. Bilateral kidneys enhance symmetrically. Density lesions likely represent simple renal cysts. Simple renal cysts, in the absence of clinically indicated signs/symptoms, require no independent follow-up. No hydronephrosis. No hydroureter. The urinary bladder is unremarkable. Stomach/Bowel: Stomach is within normal limits. No evidence of bowel wall thickening or dilatation. Diffuse colonic diverticulosis. Appendix appears normal. Vascular/Lymphatic: Mild atherosclerotic plaque. No abdominal aorta or iliac aneurysm. Mild atherosclerotic plaque of the aorta and its branches. No abdominal, pelvic, or inguinal lymphadenopathy. Reproductive: Enlarged prostate measuring up to 5.3  cm. Other: No intraperitoneal free fluid. No intraperitoneal free gas. No organized fluid collection. Musculoskeletal: Tiny fat containing umbilical hernia. No suspicious lytic or blastic osseous lesions. No acute displaced fracture. Multilevel degenerative changes of the spine. L2-L3 intervertebral disc space vacuum phenomenon and endplate sclerosis. L3-L5 posterolateral interbody surgical fusion hardware and laminectomy. Total left hip arthroplasty. IMPRESSION: 1. 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 Recommend annual imaging followup by CTA or MRA. This recommendation follows 2010 ACCF/AHA/AATS/ACR/ASA/SCA/SCAI/SIR/STS/SVM Guidelines for the Diagnosis and  Management of Patients with Thoracic Aortic Disease. Circulation. 2010; 121: Q469-G295. Aortic aneurysm NOS (ICD10-I71.9) 2. Large hiatal hernia containing the entire stomach. No findings of volvulus. 3. Colonic diverticulosis with no acute diverticulitis. 4. Prostatomegaly. Electronically Signed   By: Tish Frederickson M.D.   On: 11/24/2022 18:52    Procedures Procedures    Medications Ordered in ED Medications  morphine (PF) 4 MG/ML injection 4 mg (4 mg Intravenous Given 11/24/22 1643)  pantoprazole (PROTONIX) injection 40 mg (40 mg Intravenous Given 11/24/22 1643)  ondansetron (ZOFRAN) injection 4 mg (4 mg Intravenous Given 11/24/22 1642)  iohexol (OMNIPAQUE) 350 MG/ML injection 100 mL (100 mLs Intravenous Contrast Given 11/24/22 1817)    ED Course/ Medical Decision Making/ A&P Clinical Course as of 11/24/22 1940  Sun Nov 24, 2022  1622 CBC and normal.  Lipase normal.  Metabolic panel shows creatinine increased at 1.36.  Urinalysis without signs of infection [JK]  1906 CT scans shows large hiatal hernia, stable ascending aorta, no findings of volvulus. [JK]    Clinical Course User Index [JK] Linwood Dibbles, MD                             Medical Decision Making Frontal diagnosis includes but not limited to recurrent volvulus,  aortic aneurysm irruption, acute coronary syndrome, pancreatitis  Problems Addressed: Epigastric pain: acute illness or injury that poses a threat to life or bodily functions Hiatal hernia: chronic illness or injury with exacerbation, progression, or side effects of treatment  Amount and/or Complexity of Data Reviewed Labs: ordered. Radiology: ordered.  Risk OTC drugs. Prescription drug management.   Patient's ED workup is reassuring.  No signs of infection.  No signs of pancreatitis hepatitis.  No signs of urinary tract infection.  Patient serial troponins and EKG are reassuring.  Doubt symptoms are related to acute coronary syndrome.  Patient CT scan shows stable ascending thoracic aorta.  Patient has his known hiatal hernia but no evidence of recurrent volvulus.  Patient symptoms have improved with treatment.  I suspect he is having recurrent issues associated with his hiatal hernia.  He appears stable for outpatient management with planned surgical intervention.  Evaluation and diagnostic testing in the emergency department does not suggest an emergent condition requiring admission or immediate intervention beyond what has been performed at this time.  The patient is safe for discharge and has been instructed to return immediately for worsening symptoms, change in symptoms or any other concerns.         Final Clinical Impression(s) / ED Diagnoses Final diagnoses:  Epigastric pain  Hiatal hernia    Rx / DC Orders ED Discharge Orders          Ordered    sucralfate (CARAFATE) 1 g tablet  3 times daily with meals & bedtime        11/24/22 1937    HYDROcodone-acetaminophen (NORCO/VICODIN) 5-325 MG tablet  Every 6 hours PRN        11/24/22 1937    docusate sodium (COLACE) 100 MG capsule  Every 12 hours        11/24/22 1937              Linwood Dibbles, MD 11/24/22 1940

## 2022-11-24 NOTE — ED Triage Notes (Signed)
Per [facility] pt coming from home- pt reports having hiatal hernia and is currently waiting for surgery. During lunch today began having sharp abdominal pain. Reports very minimal BM over past 4 days.

## 2022-11-24 NOTE — Discharge Instructions (Addendum)
Take the medications as needed for pain and discomfort.  Start taking the Carafate in addition to your Protonix.  Follow-up with Dr. Derrell Lolling as planned.

## 2022-11-25 DIAGNOSIS — R1013 Epigastric pain: Secondary | ICD-10-CM | POA: Diagnosis not present

## 2022-12-20 DIAGNOSIS — E782 Mixed hyperlipidemia: Secondary | ICD-10-CM | POA: Diagnosis not present

## 2022-12-20 DIAGNOSIS — I10 Essential (primary) hypertension: Secondary | ICD-10-CM | POA: Diagnosis not present

## 2022-12-20 DIAGNOSIS — I4819 Other persistent atrial fibrillation: Secondary | ICD-10-CM | POA: Diagnosis not present

## 2022-12-20 DIAGNOSIS — N1831 Chronic kidney disease, stage 3a: Secondary | ICD-10-CM | POA: Diagnosis not present

## 2022-12-20 DIAGNOSIS — K219 Gastro-esophageal reflux disease without esophagitis: Secondary | ICD-10-CM | POA: Diagnosis not present

## 2022-12-23 NOTE — Pre-Procedure Instructions (Signed)
Surgical Instructions    Your procedure is scheduled on Tuesday 12/31/22.   Report to [facility] Main Entrance "A" at 10:15 A.M., then check in with the Admitting office.  Call this number if you have problems the morning of surgery:  [phone]   If you have any questions prior to your surgery date call [phone]: Open Monday-Friday 8am-4pm If you experience any cold or flu symptoms such as cough, fever, chills, shortness of breath, etc. between now and your scheduled surgery, please notify us at the above number     Remember:  Do not eat after midnight the night before your surgery  You may drink clear liquids until 09:15 A.M. the morning of your surgery.   Clear liquids allowed are: Water, Non-Citrus Juices (without pulp), Carbonated Beverages, Clear Tea, Black Coffee ONLY (NO MILK, CREAM OR POWDERED CREAMER of any kind), and Gatorade  Patient Instructions  The night before surgery:  No food after midnight. ONLY clear liquids after midnight  The day of surgery (if you do NOT have diabetes):  Drink ONE (1) Pre-Surgery Clear Ensure by 09:15 A.M. the morning of surgery. Drink in one sitting. Do not sip.  This drink was given to you during your hospital  pre-op appointment visit.  Nothing else to drink after completing the  Pre-Surgery Clear Ensure.         If you have questions, please contact your surgeon's office.     Take these medicines the morning of surgery with A SIP OF WATER:   carvedilol (COREG)   DULoxetine (CYMBALTA)   pantoprazole (PROTONIX)   tamsulosin (FLOMAX)     Take these medicines if needed:   acetaminophen (TYLENOL)   docusate sodium (COLACE)   nitroGLYCERIN (NITROSTAT)   Please follow your surgeon's instructions regarding ELIQUIS. If you have not received instructions then please contact your surgeon's office for instructions.    As of today, STOP taking any Aspirin (unless otherwise instructed by your surgeon) Aleve, Naproxen, Ibuprofen,  Motrin, Advil, Goody's, BC's, all herbal medications, fish oil, and all vitamins.           Do not wear jewelry or makeup. Do not wear lotions, powders, perfumes/cologne or deodorant. Do not shave 48 hours prior to surgery.  Men may shave face and neck. Do not bring valuables to the hospital. Do not wear nail polish, gel polish, artificial nails, or any other type of covering on natural nails (fingers and toes) If you have artificial nails or gel coating that need to be removed by a nail salon, please have this removed prior to surgery. Artificial nails or gel coating may interfere with anesthesia's ability to adequately monitor your vital signs.   is not responsible for any belongings or valuables.    Do NOT Smoke (Tobacco/Vaping)  24 hours prior to your procedure  If you use a CPAP at night, you may bring your mask for your overnight stay.   Contacts, glasses, hearing aids, dentures or partials may not be worn into surgery, please bring cases for these belongings   For patients admitted to the hospital, discharge time will be determined by your treatment team.   Patients discharged the day of surgery will not be allowed to drive home, and someone needs to stay with them for 24 hours.   SURGICAL WAITING ROOM VISITATION Patients having surgery or a procedure may have no more than 2 support people in the waiting area - these visitors may rotate.   Children under the age  of 16 must have an adult with them who is not the patient. If the patient needs to stay at the hospital during part of their recovery, the visitor guidelines for inpatient rooms apply. Pre-op nurse will coordinate an appropriate time for 1 support person to accompany patient in pre-op.  This support person may not rotate.   Please refer to [email or web address] for the visitor guidelines for Inpatients (after your surgery is over and you are in a regular room).     Special instructions:    Oral Hygiene is also important to reduce your risk of infection.  Remember - BRUSH YOUR TEETH THE MORNING OF SURGERY WITH YOUR REGULAR TOOTHPASTE   [facility]- Preparing For Surgery  Before surgery, you can play an important role. Because skin is not sterile, your skin needs to be as free of germs as possible. You can reduce the number of germs on your skin by washing with CHG (chlorahexidine gluconate) Soap before surgery.  CHG is an antiseptic cleaner which kills germs and bonds with the skin to continue killing germs even after washing.     Please do not use if you have an allergy to CHG or antibacterial soaps. If your skin becomes reddened/irritated stop using the CHG.  Do not shave (including legs and underarms) for at least 48 hours prior to first CHG shower. It is OK to shave your face.  Please follow these instructions carefully.     Shower the NIGHT BEFORE SURGERY and the MORNING OF SURGERY with CHG Soap.   If you chose to wash your hair, wash your hair first as usual with your normal shampoo. After you shampoo, rinse your hair and body thoroughly to remove the shampoo.  Then [employer] and genitals (private parts) with your normal soap and rinse thoroughly to remove soap.  After that Use CHG Soap as you would any other liquid soap. You can apply CHG directly to the skin and wash gently with a scrungie or a clean washcloth.   Apply the CHG Soap to your body ONLY FROM THE NECK DOWN.  Do not use on open wounds or open sores. Avoid contact with your eyes, ears, mouth and genitals (private parts). Wash Face and genitals (private parts)  with your normal soap.   Wash thoroughly, paying special attention to the area where your surgery will be performed.  Thoroughly rinse your body with warm water from the neck down.  DO NOT shower/wash with your normal soap after using and rinsing off the CHG Soap.  Pat yourself dry with a CLEAN TOWEL.  Wear CLEAN  PAJAMAS to bed the night before surgery  Place CLEAN SHEETS on your bed the night before your surgery  DO NOT SLEEP WITH PETS.   Day of Surgery:  Take a shower with CHG soap. Wear Clean/Comfortable clothing the morning of surgery Do not apply any deodorants/lotions.   Remember to brush your teeth WITH YOUR REGULAR TOOTHPASTE.    If you received a COVID test during your pre-op visit, it is requested that you wear a mask when out in public, stay away from anyone that may not be feeling well, and notify your surgeon if you develop symptoms. If you have been in contact with anyone that has tested positive in the last 10 days, please notify your surgeon.    Please read over the following fact sheets that you were given.

## 2022-12-24 ENCOUNTER — Encounter (HOSPITAL_COMMUNITY): Payer: Self-pay

## 2022-12-24 ENCOUNTER — Other Ambulatory Visit (HOSPITAL_COMMUNITY): Payer: Medicare Other

## 2022-12-24 ENCOUNTER — Other Ambulatory Visit: Payer: Self-pay

## 2022-12-24 ENCOUNTER — Encounter (HOSPITAL_COMMUNITY)
Admission: RE | Admit: 2022-12-24 | Discharge: 2022-12-24 | Disposition: A | Discharge status: Home / Self Care | Payer: Medicare Other | Source: Ambulatory Visit | Attending: General Surgery | Admitting: General Surgery

## 2022-12-24 VITALS — BP 138/87 | HR 75 | Temp 98.2°F | Resp 18 | Ht 70.0 in | Wt 237.9 lb

## 2022-12-24 DIAGNOSIS — I129 Hypertensive chronic kidney disease with stage 1 through stage 4 chronic kidney disease, or unspecified chronic kidney disease: Secondary | ICD-10-CM | POA: Insufficient documentation

## 2022-12-24 DIAGNOSIS — Z01812 Encounter for preprocedural laboratory examination: Secondary | ICD-10-CM | POA: Insufficient documentation

## 2022-12-24 DIAGNOSIS — E785 Hyperlipidemia, unspecified: Secondary | ICD-10-CM | POA: Diagnosis not present

## 2022-12-24 DIAGNOSIS — Z789 Other specified health status: Secondary | ICD-10-CM | POA: Diagnosis not present

## 2022-12-24 DIAGNOSIS — N1831 Chronic kidney disease, stage 3a: Secondary | ICD-10-CM | POA: Diagnosis not present

## 2022-12-24 DIAGNOSIS — I4891 Unspecified atrial fibrillation: Secondary | ICD-10-CM | POA: Insufficient documentation

## 2022-12-24 DIAGNOSIS — F1090 Alcohol use, unspecified, uncomplicated: Secondary | ICD-10-CM

## 2022-12-24 DIAGNOSIS — Z01818 Encounter for other preprocedural examination: Secondary | ICD-10-CM

## 2022-12-24 HISTORY — DX: Personal history of other diseases of the digestive system: Z87.19

## 2022-12-24 HISTORY — DX: Chronic kidney disease, stage 3a: N18.31

## 2022-12-24 LAB — TYPE AND SCREEN
ABO/RH(D): O POS
Antibody Screen: NEGATIVE

## 2022-12-24 LAB — CBC
HCT: 44.8 % (ref 39.0–52.0)
Hemoglobin: 14.7 g/dL (ref 13.0–17.0)
MCH: 33.3 pg (ref 26.0–34.0)
MCHC: 32.8 g/dL (ref 30.0–36.0)
MCV: 101.4 fL — ABNORMAL HIGH (ref 80.0–100.0)
Platelets: 138 10*3/uL — ABNORMAL LOW (ref 150–400)
RBC: 4.42 MIL/uL (ref 4.22–5.81)
RDW: 13.9 % (ref 11.5–15.5)
WBC: 5.2 10*3/uL (ref 4.0–10.5)
nRBC: 0 % (ref 0.0–0.2)

## 2022-12-24 LAB — COMPREHENSIVE METABOLIC PANEL
ALT: 23 U/L (ref 0–44)
AST: 34 U/L (ref 15–41)
Albumin: 3.8 g/dL (ref 3.5–5.0)
Alkaline Phosphatase: 60 U/L (ref 38–126)
Anion gap: 7 (ref 5–15)
BUN: 21 mg/dL (ref 8–23)
CO2: 29 mmol/L (ref 22–32)
Calcium: 9 mg/dL (ref 8.9–10.3)
Chloride: 102 mmol/L (ref 98–111)
Creatinine, Ser: 1.52 mg/dL — ABNORMAL HIGH (ref 0.61–1.24)
GFR, Estimated: 45 mL/min — ABNORMAL LOW (ref >60)
Glucose, Bld: 100 mg/dL — ABNORMAL HIGH (ref 70–99)
Potassium: 4.8 mmol/L (ref 3.5–5.1)
Sodium: 138 mmol/L (ref 135–145)
Total Bilirubin: 0.7 mg/dL (ref 0.3–1.2)
Total Protein: 6.3 g/dL — ABNORMAL LOW (ref 6.5–8.1)

## 2022-12-24 LAB — SURGICAL PCR SCREEN
MRSA, PCR: NEGATIVE
Staphylococcus aureus: NEGATIVE

## 2022-12-24 NOTE — Progress Notes (Signed)
PCP - Dr. [name] Cardiologist - Dr. Peter [place]  PPM/ICD - denies   Chest x-ray - 10/17/22 EKG - 11/24/22 Stress Test - denies ECHO - 07/09/18 Cardiac Cath - 03/03/08  Sleep Study - denies   DM- denies  Blood Thinner Instructions: Hold Eliquis 2-3 days. Last dose 7/26 Aspirin Instructions: n/a  ERAS Protcol - yes PRE-SURGERY Ensure given at PAT  COVID TEST- n/a   Anesthesia review: yes, cardiac hx  Patient denies shortness of breath, fever, cough and chest pain at PAT appointment   All instructions explained to the patient, with a verbal understanding of the material. Patient agrees to go over the instructions while at home for a better understanding. The opportunity to ask questions was provided.

## 2022-12-25 ENCOUNTER — Other Ambulatory Visit: Payer: Self-pay | Admitting: Neurology

## 2022-12-25 DIAGNOSIS — H26492 Other secondary cataract, left eye: Secondary | ICD-10-CM | POA: Diagnosis not present

## 2022-12-25 DIAGNOSIS — H5203 Hypermetropia, bilateral: Secondary | ICD-10-CM | POA: Diagnosis not present

## 2022-12-25 DIAGNOSIS — H40013 Open angle with borderline findings, low risk, bilateral: Secondary | ICD-10-CM | POA: Diagnosis not present

## 2022-12-25 DIAGNOSIS — Z961 Presence of intraocular lens: Secondary | ICD-10-CM | POA: Diagnosis not present

## 2022-12-25 NOTE — Progress Notes (Signed)
Anesthesia Chart Review:  Follows with cardiology for hx of afib, HLD, HTN. Seen by Edd Fabian, NP on 11/21/22 for preop eval. Per note, "Chart reviewed as part of pre-operative protocol coverage. Given past medical history and time since last visit, based on ACC/AHA guidelines, [name] would be at acceptable risk for the planned procedure without further cardiovascular testing. His RCRI is a class I risk, 0.4% risk of major cardiac event.  He is able to complete greater than 4 METS of physical activity. Per office protocol, patient can hold Eliquis for 2-3 days prior to procedure."  Pt reports LD Eliquis 12/27/22.  Hx of CKD IIIa.  Preop labs reviewed, creatinine elevated 1.52 (baseline ~1.40), mild chronic thrombocytopenia platelets 138, otherwise unremarkable.   EKG 11/24/22: Atrial fibrillation. Rate 63. Left axis deviation. Borderline low voltage, extremity leads. RSR' in V1 or V2, probably normal variant. No significant change since last tracing  CT C/A/P 11/24/22: IMPRESSION: 1. 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 Recommend annual imaging followup by CTA or MRA. This recommendation follows 2010 ACCF/AHA/AATS/ACR/ASA/SCA/SCAI/SIR/STS/SVM Guidelines for the Diagnosis and Management of Patients with Thoracic Aortic Disease. Circulation. 2010; 121: Z610-R604. Aortic aneurysm NOS (ICD10-I71.9) 2. Large hiatal hernia containing the entire stomach. No findings of volvulus. 3. Colonic diverticulosis with no acute diverticulitis. 4. Prostatomegaly.  TTE 07/09/18:  1. The left ventricle has normal systolic function of 55-60%. The cavity  size was normal. There is no increased left ventricular wall thickness.  Left ventricular diastology could not be evaluated secondary to atrial  fibrillation.   2. The right ventricle has normal systolic function. The cavity was  mildly enlarged. There is no increase in right ventricular wall thickness.   3. Left atrial size was  moderately dilated.   4. The mitral valve is normal in structure.   5. The tricuspid valve is normal in structure.   6. The aortic valve is normal in structure. Aortic valve regurgitation is  mild by color flow Doppler.   7. The pulmonic valve was normal in structure.   8. There is mild dilatation of the ascending aorta.    Zannie Cove [facility] Stay [facility]/Anesthesiology Phone [phone] 12/25/2022 10:25 AM

## 2022-12-25 NOTE — Anesthesia Preprocedure Evaluation (Addendum)
Anesthesia Evaluation  Patient identified by MRN, date of birth, ID band Patient awake    Reviewed: Allergy & Precautions, NPO status , Patient's Chart, lab work & pertinent test results, reviewed documented beta blocker date and time   History of Anesthesia Complications (+) PONV and history of anesthetic complications  Airway Mallampati: II   Neck ROM: Full    Dental no notable dental hx. (+) Dental Advisory Given   Pulmonary Current Smoker and Patient abstained from smoking.   Pulmonary exam normal        Cardiovascular hypertension, Pt. on home beta blockers and Pt. on medications + dysrhythmias (on Eliquis) Atrial Fibrillation  Rhythm:Irregular Rate:Normal  TTE 07/09/18:  1. The left ventricle has normal systolic function of 55-60%. The cavity  size was normal. There is no increased left ventricular wall thickness.  Left ventricular diastology could not be evaluated secondary to atrial  fibrillation.   2. The right ventricle has normal systolic function. The cavity was  mildly enlarged. There is no increase in right ventricular wall thickness.   3. Left atrial size was moderately dilated.   4. The mitral valve is normal in structure.   5. The tricuspid valve is normal in structure.   6. The aortic valve is normal in structure. Aortic valve regurgitation is  mild by color flow Doppler.   7. The pulmonic valve was normal in structure.   8. There is mild dilatation of the ascending aorta.     Neuro/Psych  PSYCHIATRIC DISORDERS  Depression       GI/Hepatic hiatal hernia,GERD  ,,Gastric vovlulus   Endo/Other    Renal/GU Renal InsufficiencyRenal disease     Musculoskeletal  (+) Arthritis ,    Abdominal   Peds  Hematology   Anesthesia Other Findings   Reproductive/Obstetrics                             Anesthesia Physical Anesthesia Plan  ASA: 3  Anesthesia Plan: General   Post-op  Pain Management: Tylenol PO (post-op) and Toradol IV (intra-op)   Induction: Intravenous  PONV Risk Score and Plan: 4 or greater and Ondansetron, Dexamethasone, Diphenhydramine, Treatment may vary due to age or medical condition and TIVA  Airway Management Planned: Oral ETT  Additional Equipment: None  Intra-op Plan:   Post-operative Plan: Extubation in OR  Informed Consent: I have reviewed the patients History and Physical, chart, labs and discussed the procedure including the risks, benefits and alternatives for the proposed anesthesia with the patient or authorized representative who has indicated his/her understanding and acceptance.     Dental advisory given  Plan Discussed with: Anesthesiologist and CRNA  Anesthesia Plan Comments: (PAT note by Antionette Poles, PA-C: Follows with cardiology for hx of afib, HLD, HTN. Seen by Edd Fabian, NP on 11/21/22 for preop eval. Per note, "Chart reviewed as part of pre-operative protocol coverage. Given past medical history and time since last visit, based on ACC/AHA guidelines, JAMONTE CURFMAN would be at acceptable risk for the planned procedure without further cardiovascular testing. His RCRI is a class I risk, 0.4% risk of major cardiac event.  He is able to complete greater than 4 METS of physical activity. Per office protocol, patient can hold Eliquis for 2-3 days prior to procedure."  Pt reports LD Eliquis 12/27/22.  Hx of CKD IIIa.  Preop labs reviewed, creatinine elevated 1.52 (baseline ~1.40), mild chronic thrombocytopenia platelets 138, otherwise unremarkable.   EKG  11/24/22: Atrial fibrillation. Rate 63. Left axis deviation. Borderline low voltage, extremity leads. RSR' in V1 or V2, probably normal variant. No significant change since last tracing  CT C/A/P 11/24/22: IMPRESSION: 1. 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 Recommend annual imaging followup by CTA or MRA. This recommendation follows 2010  ACCF/AHA/AATS/ACR/ASA/SCA/SCAI/SIR/STS/SVM Guidelines for the Diagnosis and Management of Patients with Thoracic Aortic Disease. Circulation. 2010; 121: Y865-H846. Aortic aneurysm NOS (ICD10-I71.9) 2. Large hiatal hernia containing the entire stomach. No findings of volvulus. 3. Colonic diverticulosis with no acute diverticulitis. 4. Prostatomegaly.  TTE 07/09/18: 1. The left ventricle has normal systolic function of 55-60%. The cavity  size was normal. There is no increased left ventricular wall thickness.  Left ventricular diastology could not be evaluated secondary to atrial  fibrillation.  2. The right ventricle has normal systolic function. The cavity was  mildly enlarged. There is no increase in right ventricular wall thickness.  3. Left atrial size was moderately dilated.  4. The mitral valve is normal in structure.  5. The tricuspid valve is normal in structure.  6. The aortic valve is normal in structure. Aortic valve regurgitation is  mild by color flow Doppler.  7. The pulmonic valve was normal in structure.  8. There is mild dilatation of the ascending aorta.   )       Anesthesia Quick Evaluation

## 2022-12-30 NOTE — H&P (Signed)
Chief Complaint: New Problem (Hiatal Hernia)       History of Present Illness: [name] is a 82 y.o. male who is seen today as an office consultation at the request of Dr. Randell Patient for evaluation of New Problem (Hiatal Hernia) .     Patient is an 82 year old male, who comes in secondary to a history of gastric volvulus, hiatal hernia.  Patient also has a history of A-fib-on Eliquis.  Patient sees Dr. Swaziland his cardiologist.   Patient was recently admitted to the hospital secondary to epigastric abdominal pain.  This came on abruptly while eating.  He states he was tract in the hospital or after.  He was seen by our EGS team and was found to have gastric volvulus.  Patient underwent NG tube decompression and endoscopy.  There is no signs of continued volvulus at the time.  He did well and was discharged.   He states that since that time he has not had any further episodes similar to his pain and discomfort due to the volvulus.   Patient's had no previous abdominal surgery.   Patient did have a CT scan in the hospital and was found to have a large hiatal hernia at approximately 6 cm.     Review of Systems: A complete review of systems was obtained from the patient.  I have reviewed this information and discussed as appropriate with the patient.  See HPI as well for other ROS.   Review of Systems  Constitutional:  Negative for fever.  HENT:  Negative for congestion.   Eyes:  Negative for blurred vision.  Respiratory:  Negative for cough, shortness of breath and wheezing.   Cardiovascular:  Negative for chest pain and palpitations.  Gastrointestinal:  Negative for heartburn.  Genitourinary:  Negative for dysuria.  Musculoskeletal:  Negative for myalgias.  Skin:  Negative for rash.  Neurological:  Negative for dizziness and headaches.  Psychiatric/Behavioral:  Negative for depression and suicidal ideas.   All other systems reviewed and are negative.       Medical History: Past  Medical History Past Medical History: Diagnosis Date  GERD (gastroesophageal reflux disease)    Hyperlipidemia         Problem List There is no problem list on file for this patient.     Past Surgical History History reviewed. No pertinent surgical history.     Allergies Allergies Allergen Reactions  Compazine [Prochlorperazine Edisylate] Other (See Comments)     Involuntary muscle movement  Gemfibrozil Nausea  Penicillins Nausea, Rash and Vomiting  Red Yeast Rice Other (See Comments)     Flushing    Welchol [Colesevelam] Other (See Comments)     Leg aches        Medications Ordered Prior to Encounter Current Outpatient Medications on File Prior to Visit Medication Sig Dispense Refill  acetaminophen (TYLENOL) 325 MG tablet Take 650 mg by mouth every 6 (six) hours as needed      bisacodyL (DULCOLAX) 5 mg EC tablet TAKE TWO TABLETS BY MOUTH DAILY AS NEEDED FOR moderate constipation      carvediloL (COREG) 3.125 MG tablet Take 3.125 mg by mouth every evening      clonazePAM (KLONOPIN) 0.5 MG tablet take 1/2-1 TABLET BY MOUTH EVERYDAY AT BEDTIME AS NEEDED      ELIQUIS 5 mg tablet Take 5 mg by mouth 2 (two) times daily      FEROSUL 325 mg (65 mg iron) tablet Take 325 mg by mouth once daily  multivitamin with iron/minerals (THERADEX-M) 27-0.4 mg tablet Take 1 tablet by mouth once daily      pantoprazole (PROTONIX) 40 MG DR tablet TAKE ONE TABLET BY MOUTH twice A DAY FOUR DAYS A WEEK alternating with ONE TABLET daily THREE DAYS A WEEK      polyethylene glycol (MIRALAX) packet Take 17 g by mouth once daily as needed      pregabalin (LYRICA) 100 MG capsule TAKE FOUR CAPSULES BY MOUTH AT BEDTIME      rosuvastatin (CRESTOR) 10 MG tablet Take 10 mg by mouth every evening      tamsulosin (FLOMAX) 0.4 mg capsule Take 0.4 mg by mouth every morning      traMADoL (ULTRAM) 50 mg tablet Take 50 mg by mouth every 6 (six) hours as needed        No current facility-administered  medications on file prior to visit.      Family History Family History Problem Relation Age of Onset  High blood pressure (Hypertension) Mother    Obesity Mother    Coronary Artery Disease (Blocked arteries around heart) Brother        Tobacco Use History Social History    Tobacco Use Smoking Status Former  Types: Cigarettes, Pipe Smokeless Tobacco Never      Social History Social History    Socioeconomic History  Marital status: Widowed Tobacco Use  Smoking status: Former     Types: Cigarettes, Pipe  Smokeless tobacco: Never Vaping Use  Vaping status: Never Used Substance and Sexual Activity  Alcohol use: Yes  Drug use: Never    Social Determinants of Health    Financial Resource Strain: Low Risk  (10/14/2022)   Received from [facility]   Overall Financial Resource Strain (CARDIA)    Difficulty of Paying Living Expenses: Not hard at all Food Insecurity: No Food Insecurity (10/14/2022)   Received from [facility]   Hunger Vital Sign    Worried About Running Out of Food in the Last Year: Never true    Ran Out of Food in the Last Year: Never true Transportation Needs: No Transportation Needs (10/14/2022)   Received from [facility] - Transportation    Lack of Transportation (Medical): No    Lack of Transportation (Non-Medical): No Physical Activity: Sufficiently Active (10/14/2022)   Received from [facility]   Exercise Vital Sign    Days of Exercise per Week: 5 days    Minutes of Exercise per Session: 30 min Stress: No Stress Concern Present (10/14/2022)   Received from [facility] of Occupational Health - Occupational Stress Questionnaire    Feeling of Stress : Not at all Social Connections: Moderately Integrated (10/14/2022)   Received from [facility]   Social Connection and Isolation Panel [NHANES]    Frequency of Communication with Friends and Family: Twice a week    Frequency of Social Gatherings with Friends and  Family: Once a week    Attends Religious Services: More than 4 times per year    Active Member of Clubs or Organizations: No    Attends Banker Meetings: Never    Marital Status: Living with partner      Objective:     Vitals:   11/11/22 0926 BP: (!) 132/92 Pulse: 86 SpO2: 98% Weight: (!) 107.5 kg (237 lb) Height: 177.8 cm (5\' 10" ) PainSc: 0-No pain   Body mass index is 34.01 kg/m.   Physical Exam Constitutional:      General:  He is not in acute distress.    Appearance: Normal appearance.  HENT:     Head: Normocephalic.     Nose: No rhinorrhea.     Mouth/Throat:     Mouth: Mucous membranes are moist.     Pharynx: Oropharynx is clear.  Eyes:     General: No scleral icterus.    Pupils: Pupils are equal, round, and reactive to light.  Cardiovascular:     Rate and Rhythm: Normal rate.     Pulses: Normal pulses.  Pulmonary:     Effort: Pulmonary effort is normal. No respiratory distress.     Breath sounds: No stridor. No wheezing.  Abdominal:     General: Abdomen is flat. There is no distension.     Tenderness: There is no abdominal tenderness. There is no guarding or rebound.  Musculoskeletal:        General: Normal range of motion.     Cervical back: Normal range of motion and neck supple.  Skin:    General: Skin is warm and dry.     Capillary Refill: Capillary refill takes less than 2 seconds.     Coloration: Skin is not jaundiced.  Neurological:     General: No focal deficit present.     Mental Status: He is alert and oriented to person, place, and time. Mental status is at baseline.  Psychiatric:        Mood and Affect: Mood normal.        Thought Content: Thought content normal.        Judgment: Judgment normal.      Assessment and Plan: Diagnoses and all orders for this visit:   Diaphragmatic hernia without obstruction or gangrene     [name] is a 82 y.o. male    1.  We will proceed to the OR for a robotic hiatal hernia repair  with mesh and fundoplication 2. All risks and benefits were discussed with the patient, to generally include infection, bleeding, damage to surrounding structures, possible pneumothorax, and recurrence. Alternatives were offered and described.  All questions were answered and the patient voiced understanding of the procedure and wishes to proceed at this point.

## 2022-12-31 ENCOUNTER — Other Ambulatory Visit: Payer: Self-pay

## 2022-12-31 ENCOUNTER — Encounter (HOSPITAL_COMMUNITY): Payer: Self-pay | Admitting: General Surgery

## 2022-12-31 ENCOUNTER — Inpatient Hospital Stay (HOSPITAL_COMMUNITY)
Admission: AD | Admit: 2022-12-31 | Discharge: 2023-01-03 | DRG: 328 | Disposition: A | Discharge status: Home / Self Care | Payer: Medicare Other | Source: Ambulatory Visit | Attending: General Surgery | Admitting: General Surgery

## 2022-12-31 ENCOUNTER — Ambulatory Visit (HOSPITAL_COMMUNITY): Payer: Medicare Other | Admitting: Physician Assistant

## 2022-12-31 ENCOUNTER — Encounter (HOSPITAL_COMMUNITY)
Admission: AD | Disposition: A | Discharge status: Home / Self Care | Payer: Self-pay | Source: Ambulatory Visit | Attending: General Surgery

## 2022-12-31 ENCOUNTER — Ambulatory Visit (HOSPITAL_COMMUNITY): Payer: Medicare Other | Admitting: Anesthesiology

## 2022-12-31 DIAGNOSIS — Z87891 Personal history of nicotine dependence: Secondary | ICD-10-CM | POA: Diagnosis not present

## 2022-12-31 DIAGNOSIS — Z880 Allergy status to penicillin: Secondary | ICD-10-CM | POA: Diagnosis not present

## 2022-12-31 DIAGNOSIS — Z8719 Personal history of other diseases of the digestive system: Secondary | ICD-10-CM

## 2022-12-31 DIAGNOSIS — K562 Volvulus: Secondary | ICD-10-CM | POA: Diagnosis present

## 2022-12-31 DIAGNOSIS — Z8249 Family history of ischemic heart disease and other diseases of the circulatory system: Secondary | ICD-10-CM | POA: Diagnosis not present

## 2022-12-31 DIAGNOSIS — K449 Diaphragmatic hernia without obstruction or gangrene: Secondary | ICD-10-CM | POA: Diagnosis not present

## 2022-12-31 DIAGNOSIS — F17290 Nicotine dependence, other tobacco product, uncomplicated: Secondary | ICD-10-CM | POA: Diagnosis not present

## 2022-12-31 DIAGNOSIS — Z79899 Other long term (current) drug therapy: Secondary | ICD-10-CM | POA: Diagnosis not present

## 2022-12-31 DIAGNOSIS — N183 Chronic kidney disease, stage 3 unspecified: Secondary | ICD-10-CM

## 2022-12-31 DIAGNOSIS — K3189 Other diseases of stomach and duodenum: Secondary | ICD-10-CM | POA: Diagnosis present

## 2022-12-31 DIAGNOSIS — I129 Hypertensive chronic kidney disease with stage 1 through stage 4 chronic kidney disease, or unspecified chronic kidney disease: Secondary | ICD-10-CM

## 2022-12-31 DIAGNOSIS — Z7901 Long term (current) use of anticoagulants: Secondary | ICD-10-CM

## 2022-12-31 DIAGNOSIS — F32A Depression, unspecified: Secondary | ICD-10-CM | POA: Diagnosis present

## 2022-12-31 DIAGNOSIS — Z9889 Other specified postprocedural states: Principal | ICD-10-CM | POA: Diagnosis present

## 2022-12-31 DIAGNOSIS — I4891 Unspecified atrial fibrillation: Secondary | ICD-10-CM | POA: Diagnosis not present

## 2022-12-31 DIAGNOSIS — K219 Gastro-esophageal reflux disease without esophagitis: Secondary | ICD-10-CM | POA: Diagnosis not present

## 2022-12-31 DIAGNOSIS — Z888 Allergy status to other drugs, medicaments and biological substances status: Secondary | ICD-10-CM

## 2022-12-31 DIAGNOSIS — E785 Hyperlipidemia, unspecified: Secondary | ICD-10-CM | POA: Diagnosis present

## 2022-12-31 HISTORY — PX: XI ROBOTIC ASSISTED HIATAL HERNIA REPAIR: SHX6889

## 2022-12-31 HISTORY — PX: INSERTION OF MESH: SHX5868

## 2022-12-31 SURGERY — REPAIR, HERNIA, HIATAL, ROBOT-ASSISTED
Anesthesia: General | Site: Abdomen

## 2022-12-31 MED ORDER — ROCURONIUM BROMIDE 10 MG/ML (PF) SYRINGE
PREFILLED_SYRINGE | INTRAVENOUS | Status: DC | PRN
  Administered 2022-12-31: 70 mg via INTRAVENOUS
  Administered 2022-12-31: 20 mg via INTRAVENOUS

## 2022-12-31 MED ORDER — VANCOMYCIN HCL 1500 MG/300ML IV SOLN: 1500.0000 mg | INTRAVENOUS | Status: AC

## 2022-12-31 MED ORDER — ACETAMINOPHEN 500 MG PO TABS: 1000.0000 mg | ORAL_TABLET | ORAL | Status: DC

## 2022-12-31 MED ORDER — LACTATED RINGERS IV SOLN: INTRAVENOUS | Status: DC

## 2022-12-31 MED ORDER — PHENYLEPHRINE HCL-NACL 20-0.9 MG/250ML-% IV SOLN
INTRAVENOUS | Status: DC | PRN
  Administered 2022-12-31: 20 ug/min via INTRAVENOUS

## 2022-12-31 MED ORDER — ACETAMINOPHEN 10 MG/ML IV SOLN: 1000.0000 mg | Freq: Once | INTRAVENOUS | Status: DC | PRN

## 2022-12-31 MED ORDER — CARVEDILOL 3.125 MG PO TABS
3.1250 mg | ORAL_TABLET | Freq: Two times a day (BID) | ORAL | Status: DC
  Administered 2023-01-01 – 2023-01-03 (×5): 3.125 mg via ORAL
  Filled 2022-12-31 (×5): qty 1

## 2022-12-31 MED ORDER — CHLORHEXIDINE GLUCONATE CLOTH 2 % EX PADS: 6.0000 | MEDICATED_PAD | Freq: Once | CUTANEOUS | Status: DC

## 2022-12-31 MED ORDER — ALBUMIN HUMAN 5 % IV SOLN: INTRAVENOUS | Status: DC | PRN

## 2022-12-31 MED ORDER — VANCOMYCIN HCL 1500 MG/300ML IV SOLN
INTRAVENOUS | Status: AC
  Administered 2022-12-31: 1500 mg via INTRAVENOUS
  Filled 2022-12-31: qty 300

## 2022-12-31 MED ORDER — METHOCARBAMOL 1000 MG/10ML IJ SOLN
500.0000 mg | Freq: Four times a day (QID) | INTRAVENOUS | Status: DC | PRN
  Administered 2023-01-01 – 2023-01-02 (×2): 500 mg via INTRAVENOUS
  Filled 2022-12-31: qty 500
  Filled 2022-12-31: qty 5

## 2022-12-31 MED ORDER — DEXAMETHASONE SODIUM PHOSPHATE 10 MG/ML IJ SOLN
INTRAMUSCULAR | Status: AC
  Filled 2022-12-31: qty 1

## 2022-12-31 MED ORDER — ONDANSETRON 4 MG PO TBDP: 4.0000 mg | ORAL_TABLET | Freq: Four times a day (QID) | ORAL | Status: DC | PRN

## 2022-12-31 MED ORDER — ONDANSETRON HCL 4 MG/2ML IJ SOLN: 4.0000 mg | Freq: Four times a day (QID) | INTRAMUSCULAR | Status: DC | PRN

## 2022-12-31 MED ORDER — FENTANYL CITRATE (PF) 250 MCG/5ML IJ SOLN
INTRAMUSCULAR | Status: AC
  Filled 2022-12-31: qty 5

## 2022-12-31 MED ORDER — PHENYLEPHRINE 80 MCG/ML (10ML) SYRINGE FOR IV PUSH (FOR BLOOD PRESSURE SUPPORT)
PREFILLED_SYRINGE | INTRAVENOUS | Status: DC | PRN
  Administered 2022-12-31: 80 ug via INTRAVENOUS

## 2022-12-31 MED ORDER — FENTANYL CITRATE (PF) 250 MCG/5ML IJ SOLN
INTRAMUSCULAR | Status: DC | PRN
  Administered 2022-12-31 (×3): 50 ug via INTRAVENOUS

## 2022-12-31 MED ORDER — FENTANYL CITRATE (PF) 100 MCG/2ML IJ SOLN
25.0000 ug | INTRAMUSCULAR | Status: DC | PRN
  Administered 2022-12-31: 25 ug via INTRAVENOUS

## 2022-12-31 MED ORDER — HYDROMORPHONE HCL 1 MG/ML IJ SOLN
1.0000 mg | INTRAMUSCULAR | Status: DC | PRN
  Administered 2022-12-31 (×2): 2 mg via INTRAVENOUS
  Administered 2023-01-01 (×2): 1 mg via INTRAVENOUS
  Administered 2023-01-01 (×2): 2 mg via INTRAVENOUS
  Filled 2022-12-31 (×2): qty 1
  Filled 2022-12-31 (×3): qty 2
  Filled 2022-12-31: qty 1
  Filled 2022-12-31: qty 2

## 2022-12-31 MED ORDER — ORAL CARE MOUTH RINSE: 15.0000 mL | Freq: Once | OROMUCOSAL | Status: AC

## 2022-12-31 MED ORDER — BUPIVACAINE-EPINEPHRINE 0.25% -1:200000 IJ SOLN
INTRAMUSCULAR | Status: DC | PRN
  Administered 2022-12-31: 16 mL

## 2022-12-31 MED ORDER — OXYCODONE HCL 5 MG PO TABS: 5.0000 mg | ORAL_TABLET | Freq: Once | ORAL | Status: DC | PRN

## 2022-12-31 MED ORDER — CHLORHEXIDINE GLUCONATE 0.12 % MT SOLN
15.0000 mL | Freq: Once | OROMUCOSAL | Status: AC
  Administered 2022-12-31: 15 mL via OROMUCOSAL
  Filled 2022-12-31: qty 15

## 2022-12-31 MED ORDER — LIDOCAINE 2% (20 MG/ML) 5 ML SYRINGE
INTRAMUSCULAR | Status: AC
  Filled 2022-12-31: qty 5

## 2022-12-31 MED ORDER — ONDANSETRON HCL 4 MG/2ML IJ SOLN
INTRAMUSCULAR | Status: AC
  Filled 2022-12-31: qty 2

## 2022-12-31 MED ORDER — PROPOFOL 10 MG/ML IV BOLUS
INTRAVENOUS | Status: AC
  Filled 2022-12-31: qty 20

## 2022-12-31 MED ORDER — ACETAMINOPHEN 500 MG PO TABS
ORAL_TABLET | ORAL | Status: AC
  Administered 2022-12-31: 1000 mg via ORAL
  Filled 2022-12-31: qty 2

## 2022-12-31 MED ORDER — BUPIVACAINE LIPOSOME 1.3 % IJ SUSP
INTRAMUSCULAR | Status: AC
  Filled 2022-12-31: qty 20

## 2022-12-31 MED ORDER — AMISULPRIDE (ANTIEMETIC) 5 MG/2ML IV SOLN: 10.0000 mg | Freq: Once | INTRAVENOUS | Status: DC | PRN

## 2022-12-31 MED ORDER — TAMSULOSIN HCL 0.4 MG PO CAPS
0.4000 mg | ORAL_CAPSULE | Freq: Every day | ORAL | Status: DC
  Administered 2023-01-01 – 2023-01-03 (×3): 0.4 mg via ORAL
  Filled 2022-12-31 (×3): qty 1

## 2022-12-31 MED ORDER — SODIUM CHLORIDE 0.9 % IV SOLN
INTRAVENOUS | Status: DC | PRN
  Administered 2022-12-31: 40 mL

## 2022-12-31 MED ORDER — SUGAMMADEX SODIUM 200 MG/2ML IV SOLN
INTRAVENOUS | Status: DC | PRN
  Administered 2022-12-31: 431.6 mg via INTRAVENOUS

## 2022-12-31 MED ORDER — PHENYLEPHRINE 80 MCG/ML (10ML) SYRINGE FOR IV PUSH (FOR BLOOD PRESSURE SUPPORT)
PREFILLED_SYRINGE | INTRAVENOUS | Status: AC
  Filled 2022-12-31: qty 10

## 2022-12-31 MED ORDER — DEXAMETHASONE SODIUM PHOSPHATE 10 MG/ML IJ SOLN
INTRAMUSCULAR | Status: DC | PRN
  Administered 2022-12-31: 5 mg via INTRAVENOUS

## 2022-12-31 MED ORDER — PROPOFOL 500 MG/50ML IV EMUL
INTRAVENOUS | Status: DC | PRN
  Administered 2022-12-31: 150 ug/kg/min via INTRAVENOUS

## 2022-12-31 MED ORDER — LIDOCAINE 2% (20 MG/ML) 5 ML SYRINGE
INTRAMUSCULAR | Status: DC | PRN
  Administered 2022-12-31: 100 mg via INTRAVENOUS

## 2022-12-31 MED ORDER — 0.9 % SODIUM CHLORIDE (POUR BTL) OPTIME
TOPICAL | Status: DC | PRN
  Administered 2022-12-31: 1000 mL

## 2022-12-31 MED ORDER — DEXMEDETOMIDINE HCL IN NACL 80 MCG/20ML IV SOLN
INTRAVENOUS | Status: AC
  Filled 2022-12-31: qty 20

## 2022-12-31 MED ORDER — FENTANYL CITRATE (PF) 100 MCG/2ML IJ SOLN
INTRAMUSCULAR | Status: AC
  Filled 2022-12-31: qty 2

## 2022-12-31 MED ORDER — OXYCODONE HCL 5 MG/5ML PO SOLN: 5.0000 mg | Freq: Once | ORAL | Status: DC | PRN

## 2022-12-31 MED ORDER — ACETAMINOPHEN 500 MG PO TABS: 1000.0000 mg | ORAL_TABLET | Freq: Once | ORAL | Status: AC

## 2022-12-31 MED ORDER — PROPOFOL 10 MG/ML IV BOLUS
INTRAVENOUS | Status: DC | PRN
  Administered 2022-12-31: 130 mg via INTRAVENOUS

## 2022-12-31 MED ORDER — DEXTROSE-SODIUM CHLORIDE 5-0.9 % IV SOLN: INTRAVENOUS | Status: DC

## 2022-12-31 MED ORDER — ENSURE PRE-SURGERY PO LIQD: 296.0000 mL | Freq: Once | ORAL | Status: DC

## 2022-12-31 MED ORDER — DEXMEDETOMIDINE HCL IN NACL 80 MCG/20ML IV SOLN
INTRAVENOUS | Status: DC | PRN
  Administered 2022-12-31 (×2): 8 ug via INTRAVENOUS

## 2022-12-31 SURGICAL SUPPLY — 62 items
ADH SKN CLS APL DERMABOND .7 (GAUZE/BANDAGES/DRESSINGS) ×1
APL PRP STRL LF DISP 70% ISPRP (MISCELLANEOUS) ×1
APPLIER CLIP 5 13 M/L LIGAMAX5 (MISCELLANEOUS)
APR CLP MED LRG 5 ANG JAW (MISCELLANEOUS)
CANNULA REDUCER 12-8 DVNC XI (CANNULA) ×1 IMPLANT
CHLORAPREP W/TINT 26 (MISCELLANEOUS) ×1 IMPLANT
CLIP APPLIE 5 13 M/L LIGAMAX5 (MISCELLANEOUS) IMPLANT
COVER MAYO STAND STRL (DRAPES) ×1 IMPLANT
COVER SURGICAL LIGHT HANDLE (MISCELLANEOUS) ×1 IMPLANT
COVER TIP SHEARS 8 DVNC (MISCELLANEOUS) IMPLANT
DEFOGGER SCOPE WARMER CLEARIFY (MISCELLANEOUS) ×1 IMPLANT
DERMABOND ADVANCED .7 DNX12 (GAUZE/BANDAGES/DRESSINGS) ×1 IMPLANT
DEVICE TROCAR PUNCTURE CLOSURE (ENDOMECHANICALS) ×1 IMPLANT
DRAIN PENROSE 0.5X18 (DRAIN) IMPLANT
DRAPE ARM DVNC X/XI (DISPOSABLE) ×4 IMPLANT
DRAPE CARDIOVASC SPLIT 88X140 (DRAPES) ×1 IMPLANT
DRAPE COLUMN DVNC XI (DISPOSABLE) ×1 IMPLANT
DRAPE ORTHO SPLIT 77X108 STRL (DRAPES) ×1
DRAPE SURG ORHT 6 SPLT 77X108 (DRAPES) ×1 IMPLANT
DRIVER NDL MEGA SUTCUT DVNCXI (INSTRUMENTS) ×1 IMPLANT
DRIVER NDLE MEGA SUTCUT DVNCXI (INSTRUMENTS) ×1 IMPLANT
ELECT REM PT RETURN 9FT ADLT (ELECTROSURGICAL) ×1
ELECTRODE REM PT RTRN 9FT ADLT (ELECTROSURGICAL) ×1 IMPLANT
FORCEPS BPLR FENES DVNC XI (FORCEP) ×1 IMPLANT
GLOVE BIO SURGEON STRL SZ7.5 (GLOVE) ×5 IMPLANT
GOWN STRL REUS W/ TWL LRG LVL3 (GOWN DISPOSABLE) ×1 IMPLANT
GOWN STRL REUS W/ TWL XL LVL3 (GOWN DISPOSABLE) ×2 IMPLANT
GOWN STRL REUS W/TWL 2XL LVL3 (GOWN DISPOSABLE) ×1 IMPLANT
GOWN STRL REUS W/TWL LRG LVL3 (GOWN DISPOSABLE) ×2
GOWN STRL REUS W/TWL XL LVL3 (GOWN DISPOSABLE) ×1
IRRIG SUCT STRYKERFLOW 2 WTIP (MISCELLANEOUS) ×1
IRRIGATION SUCT STRKRFLW 2 WTP (MISCELLANEOUS) ×1 IMPLANT
KIT BASIN OR (CUSTOM PROCEDURE TRAY) ×1 IMPLANT
KIT TURNOVER KIT B (KITS) IMPLANT
MARKER SKIN DUAL TIP RULER LAB (MISCELLANEOUS) ×1 IMPLANT
MESH BIO-A 7X10 SYN MAT (Mesh General) ×1 IMPLANT
NDL 22X1.5 STRL (OR ONLY) (MISCELLANEOUS) ×1 IMPLANT
NDL INSUFFLATION 14GA 120MM (NEEDLE) ×1 IMPLANT
NEEDLE 22X1.5 STRL (OR ONLY) (MISCELLANEOUS) ×1 IMPLANT
NEEDLE INSUFFLATION 14GA 120MM (NEEDLE) ×1 IMPLANT
OBTURATOR OPTICAL STND 8 DVNC (TROCAR)
OBTURATOR OPTICALSTD 8 DVNC (TROCAR) IMPLANT
PENCIL SMOKE EVACUATOR (MISCELLANEOUS) IMPLANT
RETRACTOR GRSP SML 8 DVNC XI (INSTRUMENTS) ×1 IMPLANT
SCISSORS LAP 5X35 DISP (ENDOMECHANICALS) IMPLANT
SCISSORS MNPLR CVD DVNC XI (INSTRUMENTS) IMPLANT
SEAL UNIV 5-12 XI (MISCELLANEOUS) ×4 IMPLANT
SEALER VESSEL EXT DVNC XI (MISCELLANEOUS) ×1 IMPLANT
SET TUBE SMOKE EVAC HIGH FLOW (TUBING) ×1 IMPLANT
SPIKE FLUID TRANSFER (MISCELLANEOUS) ×1 IMPLANT
STAPLER VISISTAT 35W (STAPLE) IMPLANT
STOPCOCK 4 WAY LG BORE MALE ST (IV SETS) ×1 IMPLANT
SUT ETHIBOND 0 36 GRN (SUTURE) ×2 IMPLANT
SUT ETHIBOND 2 0 SH (SUTURE)
SUT ETHIBOND 2 0 SH 36X2 (SUTURE) IMPLANT
SUT MNCRL AB 4-0 PS2 18 (SUTURE) ×1 IMPLANT
SUT SILK 0 SH 30 (SUTURE) ×1 IMPLANT
SUT VICRYL 0 UR6 27IN ABS (SUTURE) ×1 IMPLANT
SYR 30ML SLIP (SYRINGE) ×1 IMPLANT
TRAY FOLEY MTR SLVR 16FR STAT (SET/KITS/TRAYS/PACK) ×1 IMPLANT
TRAY LAPAROSCOPIC MC (CUSTOM PROCEDURE TRAY) ×1 IMPLANT
TROCAR ADV FIXATION 5X100MM (TROCAR) ×1 IMPLANT

## 2022-12-31 NOTE — Anesthesia Postprocedure Evaluation (Signed)
Anesthesia Post Note  Patient: [name]  Procedure(s) Performed: XI ROBOTIC ASSISTED HIATAL HERNIA REPAIR WITH MESH AND FUNDOPLICATION (Abdomen) INSERTION OF MESH (Abdomen)     Patient location during evaluation: PACU Anesthesia Type: General Level of consciousness: sedated Pain management: pain level controlled Vital Signs Assessment: post-procedure vital signs reviewed and stable Respiratory status: spontaneous breathing and respiratory function stable Cardiovascular status: stable Postop Assessment: no apparent nausea or vomiting Anesthetic complications: no   No notable events documented.  Last Vitals:  Vitals:   12/31/22 1515 12/31/22 1543  BP: 122/81 (!) 123/92  Pulse: 62 (!) 50  Resp: 13 16  Temp: 36.4 C 36.4 C  SpO2: 99% 99%    Last Pain:  Vitals:   12/31/22 1543  TempSrc: Oral  PainSc:                  Alleya Demeter DANIEL

## 2022-12-31 NOTE — Interval H&P Note (Signed)
History and Physical Interval Note:  12/31/2022 12:20 PM  [name]  has presented today for surgery, with the diagnosis of HIATAL HERNIA AND GASTRIC VOLVULUS.  The various methods of treatment have been discussed with the patient and family. After consideration of risks, benefits and other options for treatment, the patient has consented to  Procedure(s): XI ROBOTIC ASSISTED HIATAL HERNIA REPAIR WITH MESH AND FUNDOPLICATION (N/A) as a surgical intervention.  [name] history has been reviewed, patient examined, no change in status, stable for surgery.  I have reviewed the patient's chart and labs.  Questions were answered to the patient's satisfaction.     Axel Filler

## 2022-12-31 NOTE — Transfer of Care (Signed)
Immediate Anesthesia Transfer of Care Note  Patient: [name]  Procedure(s) Performed: XI ROBOTIC ASSISTED HIATAL HERNIA REPAIR WITH MESH AND FUNDOPLICATION (Abdomen) INSERTION OF MESH (Abdomen)  Patient Location: PACU  Anesthesia Type:General  Level of Consciousness: drowsy and patient cooperative  Airway & Oxygen Therapy: Patient Spontanous Breathing and Patient connected to face mask oxygen  Post-op Assessment: Report given to RN, Post -op Vital signs reviewed and stable, and Patient moving all extremities X 4  Post vital signs: Reviewed and stable  Last Vitals:  Vitals Value Taken Time  BP 112/91 12/31/22 1450  Temp    Pulse 69 12/31/22 1456  Resp 11 12/31/22 1456  SpO2 92 % 12/31/22 1456  Vitals shown include unfiled device data.  Last Pain:  Vitals:   12/31/22 1450  TempSrc:   PainSc: Asleep         Complications: No notable events documented.

## 2022-12-31 NOTE — Progress Notes (Signed)
Patient states he has had diarrhea for the past 3 weeks. No current episodes within the last 24 hours. Patient c/o shortness of breath and is lethargic. Patient states that he took a home COVID test and it was negative. Patient placed on 1L oxygen. Surgeon made aware. No new orders.

## 2022-12-31 NOTE — Anesthesia Procedure Notes (Signed)
Procedure Name: Intubation Date/Time: 12/31/2022 12:37 PM  Performed by: Aundria Rud, CRNAPre-anesthesia Checklist: Patient identified, Emergency Drugs available, Suction available and Patient being monitored Patient Re-evaluated:Patient Re-evaluated prior to induction Oxygen Delivery Method: Circle System Utilized Preoxygenation: Pre-oxygenation with 100% oxygen Induction Type: IV induction Ventilation: Mask ventilation without difficulty and Oral airway inserted - appropriate to patient size Laryngoscope Size: Mac and 4 Grade View: Grade I Tube type: Oral Tube size: 7.5 mm Number of attempts: 1 Airway Equipment and Method: Stylet Placement Confirmation: ETT inserted through vocal cords under direct vision, positive ETCO2 and breath sounds checked- equal and bilateral Secured at: 21 cm Tube secured with: Tape Dental Injury: Teeth and Oropharynx as per pre-operative assessment

## 2022-12-31 NOTE — Op Note (Signed)
12/31/2022  2:23 PM  PATIENT:  [name]  82 y.o. male  PRE-OPERATIVE DIAGNOSIS:  HIATAL HERNIA AND GASTRIC VOLVULUS  POST-OPERATIVE DIAGNOSIS:  HIATAL HERNIA AND GASTRIC VOLVULUS  PROCEDURE:  Procedure(s): XI ROBOTIC ASSISTED HIATAL HERNIA REPAIR WITH MESH AND TOUPET FUNDOPLICATION (N/A)  SURGEON:  Surgeons and Role:    Axel Filler, MD - Primary  ASSISTANTS: Jeronimo Greaves, RNFA   ANESTHESIA:   local, regional, and general  EBL:  minimal   BLOOD ADMINISTERED:none  DRAINS: none   LOCAL MEDICATIONS USED:  BUPIVICAINE  and OTHER exparel  SPECIMEN:  No Specimen  DISPOSITION OF SPECIMEN:  N/A  COUNTS:  YES  TOURNIQUET:  * No tourniquets in log *  DICTATION: .Dragon Dictation  The patient was taken back to the operating room and placed in the supine position with bilateral SCDs in place. The patient was prepped and draped in the usual sterile fashion. After appropriate antibiotics were confirmed a timeout was called and all facts were verified.   A Veress needle technique was used to insufflate the abdomen to 15 mm of mercury the paramedian stab incision. Subsequent to this an 8 mm trocar was introduced as was a 8 millimeter camera. At this time the subsequent robotic trochars x3, were then placed adjacent to this trocar approximately 8-10 cm away. Each trocar was inserted under direct visualization, there were total of 4 trochars. A 12mm trocar was placed in the midclavicular line.  A 0vicryl was placed to help with closure at the end of the case.The assistant trocar was then placed in the right lower quadrant under direct visualization. The Nathanson retractor was then visualized inserted into the abdomen and the incision just to the left of the falciform ligament. This was then placed to retract the liver appropriately. At this time the patient was positioned in reverse Trendelenburg.   At this time the robot patient cart was brought to the bedside and placed in good  position and the arms were docked to the trochars appropriately. At this time I proceeded to incised the gastrohepatic ligament.  At this time I proceeded to mobilize the stomach inferiorly and visualize the right crus. The peritoneum over the right crus was incised and right crus was identified. I proceeded to dissect this inferiorly until the left crus was seen joining the right crus. Once the right crus was adequately dissected we turned our to the left crus which was dissected away. This required traction of the stomach to the right side. Once this was visualized we then proceeded to circumferentially dissect the esophagus away from the surrounding tissue. The anterior and posterior vagus was seen along the esophagus at the GE junction.  These were both preserved throughout the entire case.  At this time the phrenoesophageal fat pad was dissected away from the esophagus. There was a moderate-sized hiatal hernia seen. I mobilized the esophagus cephalad approximately 4-5 cm, clearing away the surrounding tissue. The anterior hernia sac was dissected away from the stomach and esophagus.  At this time we turned our attention to the greater curvature the stomach and the omentum was mobilized using the robotic vessel sealer. This was taken up to the greater curvature to the hiatus. This mobilized the entire greater curvature to allow mobilization and the wrap. I then proceeded to bring the greater curvature the stomach posterior to the esophagus, and a shoeshine technique was used to evaluate the mobilization of the greater curvature.   At this time I proceeded to close  the hiatus using interrupted 0 Ethibonds x 3. This brought together the hiatal closure without undue stricture to the esophagus.   A piece of Gore Bio A hiatal mesh was placed over the hiatal closure and sutured to the crus using 0 Ethibonds sutures x 3.  At this time the greater curvature was brought around the esophagus and sutured using 0  silk sutures interrupted fashion approximately 1 cm apart x3 on each side of the esophagus in a Toupet fashion. A left collar stitch was then used to gastropexy the stomach from the wrap to the diaphragm just lateral to the left crus as.  A second collar stitch was placed from the wrap to the right crus.  The wrap lay loose with no strangulation of the esophagus.  At this time the robot was undocked. The liver trocar was removed. 0 vicryls were placed at the umbilical port site and the 12mm port site.  At this time insufflation was evacuated. Skin was reapproximated for Monocryl subcuticular fashion. The skin was then dressed with Dermabond. The patient tolerated the procedure well and was taken to the recovery room in stable condition.   PLAN OF CARE: Admit for overnight observation  PATIENT DISPOSITION:  PACU - hemodynamically stable.   Delay start of Pharmacological VTE agent (>24hrs) due to surgical blood loss or risk of bleeding: yes

## 2022-12-31 NOTE — Discharge Instructions (Signed)
EATING AFTER YOUR ESOPHAGEAL SURGERY (Stomach Fundoplication, Hiatal Hernia repair, Achalasia surgery, etc)  ######################################################################  EAT Start with a pureed / full liquid diet (see below) Gradually transition to a high fiber diet with a fiber supplement over the next month after discharge.    WALK Walk an hour a day.  Control your pain to do that.    CONTROL PAIN Control pain so that you can walk, sleep, tolerate sneezing/coughing, go up/down stairs.  HAVE A BOWEL MOVEMENT DAILY Keep your bowels regular to avoid problems.  OK to try a laxative to override constipation.  OK to use an antidairrheal to slow down diarrhea.  Call if not better after 2 tries  CALL IF YOU HAVE PROBLEMS/CONCERNS Call if you are still struggling despite following these instructions. Call if you have concerns not answered by these instructions  ######################################################################   After your esophageal surgery, expect some sticking with swallowing over the next 1-2 months.    If food sticks when you eat, it is called "dysphagia".  This is due to swelling around your esophagus at the wrap & hiatal diaphragm repair.  It will gradually ease off over the next few months.  To help you through this temporary phase, we start you out on a pureed (blenderized) diet.  Your first meal in the hospital was thin liquids.  You should have been given a pureed diet by the time you left the hospital.  We ask patients to stay on a pureed diet for the first 2-3 weeks to avoid anything getting "stuck" near your recent surgery.  Don't be alarmed if your ability to swallow doesn't progress according to this plan.  Everyone is different and some diets can advance more or less quickly.    It is often helpful to crush your medications or split them as they can sometimes stick, especially the first week or so.   Some BASIC RULES to follow  are:  Maintain an upright position whenever eating or drinking.  Take small bites - just a teaspoon size bite at a time.  Eat slowly.  It may also help to eat only one food at a time.  Consider nibbling through smaller, more frequent meals & avoid the urge to eat BIG meals  Do not push through feelings of fullness, nausea, or bloatedness  Do not mix solid foods and liquids in the same mouthful  Try not to "wash foods down" with large gulps of liquids.  Avoid carbonated (bubbly/fizzy) drinks.    Avoid foods that make you feel gassy or bloated.  Start with bland foods first.  Wait on trying greasy, fried, or spicy meals until you are tolerating more bland solids well.  Understand that it will be hard to burp and belch at first.  This gradually improves with time.  Expect to be more gassy/flatulent/bloated initially.  Walking will help your body manage it better.  Consider using medications for bloating that contain simethicone such as  Maalox or Gas-X   Consider crushing her medications, especially smaller pills.  The ability to swallow pills should get easier after a few weeks  Eat in a relaxed atmosphere & minimize distractions.  Avoid talking while eating.    Do not use straws.  Following each meal, sit in an upright position (90 degree angle) for 60 to 90 minutes.  Going for a short walk can help as well  If food does stick, don't panic.  Try to relax and let the food pass on its own.    Sipping WARM LIQUID such as strong hot black tea can also help slide it down.   Be gradual in changes & use common sense:  -If you easily tolerating a certain "level" of foods, advance to the next level gradually -If you are having trouble swallowing a particular food, then avoid it.   -If food is sticking when you advance your diet, go back to thinner previous diet (the lower LEVEL) for 1-2 days.  LEVEL 1 = PUREED DIET  Do for the first 2 WEEKS AFTER SURGERY  -Foods in this group are  pureed or blenderized to a smooth, mashed potato-like consistency.  -If necessary, the pureed foods can keep their shape with the addition of a thickening agent.   -Meat should be pureed to a smooth, pasty consistency.  Hot broth or gravy may be added to the pureed meat, approximately 1 oz. of liquid per 3 oz. serving of meat. -CAUTION:  If any foods do not puree into a smooth consistency, swallowing will be more difficult.  (For example, nuts or seeds sometimes do not blend well.)  Hot Foods Cold Foods  Pureed scrambled eggs and cheese Pureed cottage cheese  Baby cereals Thickened juices and nectars  Thinned cooked cereals (no lumps) Thickened milk or eggnog  Pureed French toast or pancakes Ensure  Mashed potatoes Ice cream  Pureed parsley, au gratin, scalloped potatoes, candied sweet potatoes Fruit or Italian ice, sherbet  Pureed buttered or alfredo noodles Plain yogurt  Pureed vegetables (no corn or peas) Instant breakfast  Pureed soups and creamed soups Smooth pudding, mousse, custard  Pureed scalloped apples Whipped gelatin  Gravies Sugar, syrup, honey, jelly  Sauces, cheese, tomato, barbecue, white, creamed Cream  Any baby food Creamer  Alcohol in moderation (not beer or champagne) Margarine  Coffee or tea Mayonnaise   Ketchup, mustard   Apple sauce   SAMPLE MENU:  PUREED DIET Breakfast Lunch Dinner   Orange juice, 1/2 cup  Cream of wheat, 1/2 cup  Pineapple juice, 1/2 cup  Pureed turkey, barley soup, 3/4 cup  Pureed Hawaiian chicken, 3 oz   Scrambled eggs, mashed or blended with cheese, 1/2 cup  Tea or coffee, 1 cup   Whole milk, 1 cup   Non-dairy creamer, 2 Tbsp.  Mashed potatoes, 1/2 cup  Pureed cooled broccoli, 1/2 cup  Apple sauce, 1/2 cup  Coffee or tea  Mashed potatoes, 1/2 cup  Pureed spinach, 1/2 cup  Frozen yogurt, 1/2 cup  Tea or coffee      LEVEL 2 = SOFT DIET  After your first 2 weeks, you can advance to a soft diet.   Keep on this  diet until everything goes down easily.  Hot Foods Cold Foods  White fish Cottage cheese  Stuffed fish Junior baby fruit  Baby food meals Semi thickened juices  Minced soft cooked, scrambled, poached eggs nectars  Souffle & omelets Ripe mashed bananas  Cooked cereals Canned fruit, pineapple sauce, milk  potatoes Milkshake  Buttered or Alfredo noodles Custard  Cooked cooled vegetable Puddings, including tapioca  Sherbet Yogurt  Vegetable soup or alphabet soup Fruit ice, Italian ice  Gravies Whipped gelatin  Sugar, syrup, honey, jelly Junior baby desserts  Sauces:  Cheese, creamed, barbecue, tomato, white Cream  Coffee or tea Margarine   SAMPLE MENU:  LEVEL 2 Breakfast Lunch Dinner   Orange juice, 1/2 cup  Oatmeal, 1/2 cup  Scrambled eggs with cheese, 1/2 cup  Decaffeinated tea, 1 cup  Whole milk, 1 cup    Non-dairy creamer, 2 Tbsp  Pineapple juice, 1/2 cup  Minced beef, 3 oz  Gravy, 2 Tbsp  Mashed potatoes, 1/2 cup  Minced fresh broccoli, 1/2 cup  Applesauce, 1/2 cup  Coffee, 1 cup  Turkey, barley soup, 3/4 cup  Minced Hawaiian chicken, 3 oz  Mashed potatoes, 1/2 cup  Cooked spinach, 1/2 cup  Frozen yogurt, 1/2 cup  Non-dairy creamer, 2 Tbsp      LEVEL 3 = CHOPPED DIET  -After all the foods in level 2 (soft diet) are passing through well you should advance up to more chopped foods.  -It is still important to cut these foods into small pieces and eat slowly.  Hot Foods Cold Foods  Poultry Cottage cheese  Chopped Swedish meatballs Yogurt  Meat salads (ground or flaked meat) Milk  Flaked fish (tuna) Milkshakes  Poached or scrambled eggs Soft, cold, dry cereal  Souffles and omelets Fruit juices or nectars  Cooked cereals Chopped canned fruit  Chopped French toast or pancakes Canned fruit cocktail  Noodles or pasta (no rice) Pudding, mousse, custard  Cooked vegetables (no frozen peas, corn, or mixed vegetables) Green salad  Canned small sweet peas  Ice cream  Creamed soup or vegetable soup Fruit ice, Italian ice  Pureed vegetable soup or alphabet soup Non-dairy creamer  Ground scalloped apples Margarine  Gravies Mayonnaise  Sauces:  Cheese, creamed, barbecue, tomato, white Ketchup  Coffee or tea Mustard   SAMPLE MENU:  LEVEL 3 Breakfast Lunch Dinner   Orange juice, 1/2 cup  Oatmeal, 1/2 cup  Scrambled eggs with cheese, 1/2 cup  Decaffeinated tea, 1 cup  Whole milk, 1 cup  Non-dairy creamer, 2 Tbsp  Ketchup, 1 Tbsp  Margarine, 1 tsp  Salt, 1/4 tsp  Sugar, 2 tsp  Pineapple juice, 1/2 cup  Ground beef, 3 oz  Gravy, 2 Tbsp  Mashed potatoes, 1/2 cup  Cooked spinach, 1/2 cup  Applesauce, 1/2 cup  Decaffeinated coffee  Whole milk  Non-dairy creamer, 2 Tbsp  Margarine, 1 tsp  Salt, 1/4 tsp  Pureed turkey, barley soup, 3/4 cup  Barbecue chicken, 3 oz  Mashed potatoes, 1/2 cup  Ground fresh broccoli, 1/2 cup  Frozen yogurt, 1/2 cup  Decaffeinated tea, 1 cup  Non-dairy creamer, 2 Tbsp  Margarine, 1 tsp  Salt, 1/4 tsp  Sugar, 1 tsp    LEVEL 4:  REGULAR FOODS  -Foods in this group are soft, moist, regularly textured foods.   -This level includes meat and breads, which tend to be the hardest things to swallow.   -Eat very slowly, chew well and continue to avoid carbonated drinks. -most people are at this level in 4-6 weeks  Hot Foods Cold Foods  Baked fish or skinned Soft cheeses - cottage cheese  Souffles and omelets Cream cheese  Eggs Yogurt  Stuffed shells Milk  Spaghetti with meat sauce Milkshakes  Cooked cereal Cold dry cereals (no nuts, dried fruit, coconut)  French toast or pancakes Crackers  Buttered toast Fruit juices or nectars  Noodles or pasta (no rice) Canned fruit  Potatoes (all types) Ripe bananas  Soft, cooked vegetables (no corn, lima, or baked beans) Peeled, ripe, fresh fruit  Creamed soups or vegetable soup Cakes (no nuts, dried fruit, coconut)  Canned chicken  noodle soup Plain doughnuts  Gravies Ice cream  Bacon dressing Pudding, mousse, custard  Sauces:  Cheese, creamed, barbecue, tomato, white Fruit ice, Italian ice, sherbet  Decaffeinated tea or coffee Whipped gelatin  Pork chops Regular gelatin     Canned fruited gelatin molds   Sugar, syrup, honey, jam, jelly   Cream   Non-dairy   Margarine   Oil   Mayonnaise   Ketchup   Mustard   TROUBLESHOOTING IRREGULAR BOWELS  1) Avoid extremes of bowel movements (no bad constipation/diarrhea)  2) Miralax 17gm mixed in 8oz. water or juice-daily. May use BID as needed.  3) Gas-x,Phazyme, etc. as needed for gas & bloating.  4) Soft,bland diet. No spicy,greasy,fried foods.  5) Prilosec over-the-counter as needed  6) May hold gluten/wheat products from diet to see if symptoms improve.  7) May try probiotics (Align, Activa, etc) to help calm the bowels down  7) If symptoms become worse call back immediately.    If you have any questions please call our office at [facility]: [phone].  

## 2023-01-01 ENCOUNTER — Encounter (HOSPITAL_COMMUNITY): Payer: Self-pay | Admitting: General Surgery

## 2023-01-01 ENCOUNTER — Observation Stay (HOSPITAL_COMMUNITY): Payer: Medicare Other

## 2023-01-01 DIAGNOSIS — E785 Hyperlipidemia, unspecified: Secondary | ICD-10-CM | POA: Diagnosis present

## 2023-01-01 DIAGNOSIS — Z888 Allergy status to other drugs, medicaments and biological substances status: Secondary | ICD-10-CM | POA: Diagnosis not present

## 2023-01-01 DIAGNOSIS — Z880 Allergy status to penicillin: Secondary | ICD-10-CM | POA: Diagnosis not present

## 2023-01-01 DIAGNOSIS — Z8249 Family history of ischemic heart disease and other diseases of the circulatory system: Secondary | ICD-10-CM | POA: Diagnosis not present

## 2023-01-01 DIAGNOSIS — K5120 Ulcerative (chronic) proctitis without complications: Secondary | ICD-10-CM | POA: Diagnosis not present

## 2023-01-01 DIAGNOSIS — Z79899 Other long term (current) drug therapy: Secondary | ICD-10-CM | POA: Diagnosis not present

## 2023-01-01 DIAGNOSIS — R2689 Other abnormalities of gait and mobility: Secondary | ICD-10-CM | POA: Diagnosis not present

## 2023-01-01 DIAGNOSIS — I4891 Unspecified atrial fibrillation: Secondary | ICD-10-CM | POA: Diagnosis present

## 2023-01-01 DIAGNOSIS — K5730 Diverticulosis of large intestine without perforation or abscess without bleeding: Secondary | ICD-10-CM | POA: Diagnosis not present

## 2023-01-01 DIAGNOSIS — M6281 Muscle weakness (generalized): Secondary | ICD-10-CM | POA: Diagnosis not present

## 2023-01-01 DIAGNOSIS — F32A Depression, unspecified: Secondary | ICD-10-CM | POA: Diagnosis present

## 2023-01-01 DIAGNOSIS — Z7901 Long term (current) use of anticoagulants: Secondary | ICD-10-CM | POA: Diagnosis not present

## 2023-01-01 DIAGNOSIS — N289 Disorder of kidney and ureter, unspecified: Secondary | ICD-10-CM | POA: Diagnosis not present

## 2023-01-01 DIAGNOSIS — K562 Volvulus: Secondary | ICD-10-CM | POA: Diagnosis present

## 2023-01-01 DIAGNOSIS — K449 Diaphragmatic hernia without obstruction or gangrene: Secondary | ICD-10-CM | POA: Diagnosis not present

## 2023-01-01 DIAGNOSIS — I10 Essential (primary) hypertension: Secondary | ICD-10-CM | POA: Diagnosis not present

## 2023-01-01 DIAGNOSIS — K219 Gastro-esophageal reflux disease without esophagitis: Secondary | ICD-10-CM | POA: Diagnosis present

## 2023-01-01 DIAGNOSIS — Z87891 Personal history of nicotine dependence: Secondary | ICD-10-CM | POA: Diagnosis not present

## 2023-01-01 DIAGNOSIS — R1319 Other dysphagia: Secondary | ICD-10-CM | POA: Diagnosis not present

## 2023-01-01 DIAGNOSIS — Z8719 Personal history of other diseases of the digestive system: Secondary | ICD-10-CM

## 2023-01-01 MED ORDER — IOHEXOL 300 MG/ML  SOLN
100.0000 mL | Freq: Once | INTRAMUSCULAR | Status: AC | PRN
  Administered 2023-01-01: 20 mL via ORAL

## 2023-01-01 MED ORDER — TRAMADOL HCL 50 MG PO TABS
50.0000 mg | ORAL_TABLET | Freq: Four times a day (QID) | ORAL | Status: DC | PRN
  Administered 2023-01-01 – 2023-01-03 (×6): 50 mg via ORAL
  Filled 2023-01-01 (×7): qty 1

## 2023-01-01 MED ORDER — ENSURE ENLIVE PO LIQD
237.0000 mL | Freq: Two times a day (BID) | ORAL | Status: DC
  Administered 2023-01-01 – 2023-01-02 (×3): 237 mL via ORAL

## 2023-01-01 NOTE — Plan of Care (Signed)

## 2023-01-01 NOTE — Progress Notes (Signed)
1 Day Post-Op   Subjective/Chief Complaint: Doing well this AM Some soreness as expected.   Objective: Vital signs in last 24 hours: Temp:  [97.3 F (36.3 C)-98.2 F (36.8 C)] 98 F (36.7 C) (07/31 0741) Pulse Rate:  [50-83] 68 (07/31 0511) Resp:  [12-18] 16 (07/31 0741) BP: (94-153)/(62-94) 116/70 (07/31 0741) SpO2:  [89 %-100 %] 100 % (07/31 0741) Weight:  [107.9 kg] 107.9 kg (07/30 1001) Last BM Date : 12/31/22  Intake/Output from previous day: 07/30 0701 - 07/31 0700 In: 2214.7 [I.V.:1964.7; IV Piggyback:250] Out: 575 [Urine:575] Intake/Output this shift: No intake/output data recorded.  General appearance: alert and cooperative GI: sore as expected, inc c/d/i  Lab Results:  Recent Labs    01/01/23 0001  WBC 7.6  HGB 14.2  HCT 41.9  PLT 145*   BMET Recent Labs    01/01/23 0001  NA 134*  K 4.0  CL 101  CO2 22  GLUCOSE 143*  BUN 16  CREATININE 1.42*  CALCIUM 8.5*   Anti-infectives: Anti-infectives (From admission, onward)    Start     Dose/Rate Route Frequency Ordered Stop   12/31/22 1200  vancomycin (VANCOREADY) IVPB 1500 mg/300 mL        1,500 mg 150 mL/hr over 120 Minutes Intravenous On call to O.R. 12/31/22 1020 12/31/22 1246       Assessment/Plan: s/p Procedure(s): XI ROBOTIC ASSISTED HIATAL HERNIA REPAIR WITH MESH AND FUNDOPLICATION (N/A) INSERTION OF MESH (N/A) POD #1 -await DG esoph.  If OK will start Pureed diet. -mobilize -pain control -likely OK for DC tomorrow.    LOS: 0 days    [name] 01/01/2023

## 2023-01-01 NOTE — Progress Notes (Signed)
Nutrition Education Note   RD received a consult for diet education s/p hernia repair, requiring pureed diet x 2 weeks. Reviewed foods that are pureed, contain no lumps. Encouraged pt to utilize oral nutrition supplements to assist in meeting calorie and protein needs. Reviewed things to avoid. Pt planned to discharge to rehab and then back to his apartment.   Encouraged pt to ask RN for RD to return if any more questions arise.    Kirby Crigler RD, LDN Clinical Dietitian See Loretha Stapler for contact information.

## 2023-01-02 MED ORDER — CYCLOBENZAPRINE HCL 5 MG PO TABS
5.0000 mg | ORAL_TABLET | Freq: Three times a day (TID) | ORAL | Status: DC | PRN
  Administered 2023-01-03 (×2): 5 mg via ORAL
  Filled 2023-01-02 (×2): qty 1

## 2023-01-02 MED ORDER — TRAMADOL HCL 50 MG PO TABS: 50.0000 mg | ORAL_TABLET | Freq: Four times a day (QID) | ORAL | 0 refills | Status: DC | PRN

## 2023-01-02 MED ORDER — METHOCARBAMOL 750 MG PO TABS: 375.0000 mg | ORAL_TABLET | Freq: Four times a day (QID) | ORAL | 0 refills | Status: DC | PRN

## 2023-01-02 NOTE — Discharge Summary (Addendum)
Physician Discharge Summary  Patient ID: [name] MRN: [id number] DOB/AGE: Dec 02, 1940 82 y.o.  Admit date: 12/31/2022 Discharge date: 01/02/2023  Admission Diagnoses: Hiatal hernia  Discharge Diagnoses:  Status post robotic hiatal hernia.  Toupet fundoplication.  Discharged Condition: good  Hospital Course: Postoperatively patient was sent to the floor.  Patient on postop day 1 had an esophagram.  This showed no leak.  Patient was started on a pured diet.  He tolerated this well.  Patient had some soreness however this was well-controlled with pain medication.  He was otherwise ambulating on his own.  He was afebrile.  He was deemed stable for discharge and discharged home.  Pt had DC held since was having more LUQ pain. Pt with CT that showed no concern for hernia.  D/w daughter the findings.  OK for DC today  Consults: None  Significant Diagnostic Studies: Esophagram without leak  Treatments: surgery: As above  Discharge Exam: Blood pressure 136/80, pulse 86, temperature 98.3 F (36.8 C), temperature source Oral, resp. rate 16, height 5\' 10"  (1.778 m), weight 107.9 kg, SpO2 97%. General appearance: alert and cooperative GI: soft, non-tender; bowel sounds normal; no masses,  no organomegaly and incision clean dry and intact.  Disposition: Discharge disposition: 01-Home or Self Care       Discharge Instructions     Increase activity slowly   Complete by: As directed       Allergies as of 01/02/2023       Reactions   Compazine [prochlorperazine Edisylate] Other (See Comments)   EXTRAPYRAMIDAL MOVEMENT [Involuntary muscle movement]   Penicillins Nausea And Vomiting, Rash, Other (See Comments)      Welchol [colesevelam Hcl] Other (See Comments)   MYALGIAS [Leg aches]   Other    General anesthesia - constipation    Gemfibrozil Nausea Only   PATIENT TOLERATES   Red Yeast Rice [cholestin] Other (See Comments)   flushing        Medication List     TAKE  these medications    acetaminophen 325 MG tablet Commonly known as: TYLENOL Take 2 tablets (650 mg total) by mouth every 6 (six) hours as needed for mild pain (or Fever >/= 101).   Benefiber Powd Take 1 Dose by mouth 3 (three) times daily. 1 dose = 1 teaspoons   bisacodyl 5 MG EC tablet Commonly known as: Dulcolax Take 2 tablets (10 mg total) by mouth daily as needed for moderate constipation. What changed: how much to take   carvedilol 3.125 MG tablet Commonly known as: COREG Take 3.125 mg by mouth 2 (two) times daily.   clonazePAM 0.5 MG tablet Commonly known as: KLONOPIN take 1/2-1 TABLET BY MOUTH EVERYDAY AT BEDTIME AS NEEDED   docusate sodium 100 MG capsule Commonly known as: COLACE Take 1 capsule (100 mg total) by mouth every 12 (twelve) hours. What changed:  when to take this reasons to take this   DULoxetine 60 MG capsule Commonly known as: CYMBALTA Take 1 capsule (60 mg total) by mouth daily.   Eliquis 5 MG Tabs tablet Generic drug: apixaban Take 1 tablet (5 mg total) by mouth 2 (two) times daily.   methocarbamol 750 MG tablet Commonly known as: Robaxin-750 Take 0.5 tablets (375 mg total) by mouth every 6 (six) hours as needed for muscle spasms.   MULTIVITAMIN PO Take 1 tablet by mouth daily.   nitroGLYCERIN 0.4 MG SL tablet Commonly known as: NITROSTAT Place 0.4 mg under the tongue every 5 (five) minutes as  needed for chest pain.   pantoprazole 40 MG tablet Commonly known as: PROTONIX Take 1 tablet (40 mg total) by mouth 2 (two) times daily before a meal. What changed: additional instructions   pregabalin 100 MG capsule Commonly known as: LYRICA TAKE FOUR CAPSULES BY MOUTH AT bedtime What changed: See the new instructions.   rOPINIRole 0.5 MG tablet Commonly known as: REQUIP TAKE ONE TABLET BY MOUTH EVERY EVENING   rosuvastatin 10 MG tablet Commonly known as: CRESTOR TAKE 1 TABLET ONCE DAILY OR AS DIRECTED. What changed: See the new  instructions.   tamsulosin 0.4 MG Caps capsule Commonly known as: FLOMAX Take 0.4 mg by mouth daily.   traMADol 50 MG tablet Commonly known as: Ultram Take 1 tablet (50 mg total) by mouth every 6 (six) hours as needed.        Follow-up Information     Axel Filler, MD. Schedule an appointment as soon as possible for a visit in 2 week(s).   Specialty: General Surgery Why: Post op visit Contact information: [address] [address] [place] [address] [phone]                 Signed: Axel Filler 01/02/2023, 7:11 AM

## 2023-01-02 NOTE — Plan of Care (Signed)
  Problem: Pain Managment: Goal: General experience of comfort will improve Outcome: Progressing   Problem: Safety: Goal: Ability to remain free from injury will improve Outcome: Progressing   Problem: Activity: Goal: Risk for activity intolerance will decrease Outcome: Progressing   Problem: Skin Integrity: Goal: Risk for impaired skin integrity will decrease Outcome: Progressing   Problem: Health Behavior/Discharge Planning: Goal: Ability to manage health-related needs will improve Outcome: Progressing   Problem: Education: Goal: Knowledge of General Education information will improve Description: Including pain rating scale, medication(s)/side effects and non-pharmacologic comfort measures Outcome: Progressing

## 2023-01-02 NOTE — Plan of Care (Signed)
  Problem: Education: Goal: Knowledge of General Education information will improve Description: Including pain rating scale, medication(s)/side effects and non-pharmacologic comfort measures Outcome: Progressing   Problem: Clinical Measurements: Goal: Ability to maintain clinical measurements within normal limits will improve Outcome: Progressing Goal: Respiratory complications will improve Outcome: Progressing   Problem: Activity: Goal: Risk for activity intolerance will decrease Outcome: Progressing   Problem: Elimination: Goal: Will not experience complications related to bowel motility Outcome: Progressing   Problem: Pain Managment: Goal: General experience of comfort will improve Outcome: Progressing

## 2023-01-02 NOTE — Plan of Care (Signed)

## 2023-01-03 ENCOUNTER — Inpatient Hospital Stay (HOSPITAL_COMMUNITY): Payer: Medicare Other

## 2023-01-03 MED ORDER — HYDROMORPHONE HCL 1 MG/ML IJ SOLN
0.5000 mg | INTRAMUSCULAR | Status: DC | PRN
  Administered 2023-01-03: 0.5 mg via INTRAVENOUS
  Filled 2023-01-03: qty 0.5

## 2023-01-03 MED ORDER — DEXTROSE-SODIUM CHLORIDE 5-0.9 % IV SOLN: INTRAVENOUS | Status: DC

## 2023-01-03 NOTE — Progress Notes (Signed)
No hernia on CT scan Pt with pain likely d/t stitch  at the trocar site.  Will plan to con't muscle relaxer and pain meds Will plan to DC to [facility] today. I d/w his daughter the findings and the plan.

## 2023-01-03 NOTE — Progress Notes (Signed)
Report called to [facility] at [place]. Daughter here to transport.

## 2023-01-03 NOTE — Progress Notes (Signed)
3 Days Post-Op   Subjective/Chief Complaint: PT Dc held yesterday d/t pain.  Pt with LUQ pain States he tol PO yesterday w/o issues, no n/v   Objective: Vital signs in last 24 hours: Temp:  [97.7 F (36.5 C)-98.3 F (36.8 C)] 97.7 F (36.5 C) (08/02 0726) Pulse Rate:  [90-100] 90 (08/02 0726) Resp:  [16-17] 16 (08/02 0726) BP: (128-135)/(76-83) 135/83 (08/02 0726) SpO2:  [95 %-100 %] 100 % (08/02 0726) Last BM Date : 12/31/22  Intake/Output from previous day: 08/01 0701 - 08/02 0700 In: 55 [IV Piggyback:55] Out: -  Intake/Output this shift: No intake/output data recorded.  General appearance: alert and cooperative GI: soft, ttp LUQ bulge, not reducible,  inc c/d/i  Lab Results:  Recent Labs    01/01/23 0001  WBC 7.6  HGB 14.2  HCT 41.9  PLT 145*   BMET Recent Labs    01/01/23 0001  NA 134*  K 4.0  CL 101  CO2 22  GLUCOSE 143*  BUN 16  CREATININE 1.42*  CALCIUM 8.5*   PT/INR No results for input(s): "LABPROT", "INR" in the last 72 hours. ABG No results for input(s): "PHART", "HCO3" in the last 72 hours.  Invalid input(s): "PCO2", "PO2"  Studies/Results: DG ESOPHAGUS W SINGLE CM (SOL OR THIN BA)  Result Date: 01/01/2023 CLINICAL DATA:  82 year old POD 1 s/p hiatal hernia repair with Nissen fundoplication and mesh EXAM: ESOPHAGUS/BARIUM SWALLOW/TABLET STUDY TECHNIQUE: Single contrast examination was performed using Omnipaque 180. This exam was performed by Loyce Dys PA-C, and was supervised and interpreted by Jeronimo Greaves, MD. FLUOROSCOPY: Radiation Exposure Index (as provided by the fluoroscopic device): 26.8 mGy Kerma COMPARISON:  CT Chest Abdomen Pelvis 11/24/22 FINDINGS: Focused, single-contrast exam performed with expected postoperative patient immobility. The esophagus is normal in appearance. No evidence of contrast leak. Delayed emptying of contrast into the stomach with eventual clearing. Contrast enters the stomach. Nissen fundoplication appears  intact. IMPRESSION: Intact Nissen fundoplication without evidence of contrast leak or obstruction. Electronically Signed   By: Jeronimo Greaves M.D.   On: 01/01/2023 10:37    Anti-infectives: Anti-infectives (From admission, onward)    Start     Dose/Rate Route Frequency Ordered Stop   12/31/22 1200  vancomycin (VANCOREADY) IVPB 1500 mg/300 mL        1,500 mg 150 mL/hr over 120 Minutes Intravenous On call to O.R. 12/31/22 1020 12/31/22 1246       Assessment/Plan: s/p Procedure(s): XI ROBOTIC ASSISTED HIATAL HERNIA REPAIR WITH MESH AND FUNDOPLICATION (N/A) INSERTION OF MESH (N/A) Plan for CT to eval for possible incisional hernia today.  If so will need OR for dx lap NPO IVF Added dilaudid for pain Will d/w his daughter.  LOS: 2 days    Axel Filler 01/03/2023

## 2023-01-03 NOTE — Plan of Care (Signed)

## 2023-01-03 NOTE — Progress Notes (Addendum)
CSW spoke with [facility].  Pt from independent living but coming to STR for one day.  Discussed that pt has not seen PT, does not have insurance auth.  [place] aware and OK for pt to DC back to [place] today.  Requests DC summary, which was sent.  RN report to [phone]. Daleen Squibb, MSW, LCSW 8/2/202410:45 AM

## 2023-01-07 DIAGNOSIS — Z7689 Persons encountering health services in other specified circumstances: Secondary | ICD-10-CM | POA: Diagnosis not present

## 2023-01-07 DIAGNOSIS — K449 Diaphragmatic hernia without obstruction or gangrene: Secondary | ICD-10-CM | POA: Diagnosis not present

## 2023-01-07 DIAGNOSIS — M6281 Muscle weakness (generalized): Secondary | ICD-10-CM | POA: Diagnosis not present

## 2023-01-08 DIAGNOSIS — G4700 Insomnia, unspecified: Secondary | ICD-10-CM | POA: Diagnosis not present

## 2023-01-08 DIAGNOSIS — E785 Hyperlipidemia, unspecified: Secondary | ICD-10-CM | POA: Diagnosis not present

## 2023-01-08 DIAGNOSIS — I10 Essential (primary) hypertension: Secondary | ICD-10-CM | POA: Diagnosis not present

## 2023-01-09 DIAGNOSIS — Z9889 Other specified postprocedural states: Secondary | ICD-10-CM

## 2023-01-09 DIAGNOSIS — K219 Gastro-esophageal reflux disease without esophagitis: Secondary | ICD-10-CM

## 2023-01-09 DIAGNOSIS — R531 Weakness: Secondary | ICD-10-CM

## 2023-01-13 DIAGNOSIS — K449 Diaphragmatic hernia without obstruction or gangrene: Secondary | ICD-10-CM | POA: Diagnosis not present

## 2023-01-13 DIAGNOSIS — M6281 Muscle weakness (generalized): Secondary | ICD-10-CM | POA: Diagnosis not present

## 2023-01-13 DIAGNOSIS — Z7689 Persons encountering health services in other specified circumstances: Secondary | ICD-10-CM | POA: Diagnosis not present

## 2023-01-14 DIAGNOSIS — R278 Other lack of coordination: Secondary | ICD-10-CM | POA: Diagnosis not present

## 2023-01-14 DIAGNOSIS — I10 Essential (primary) hypertension: Secondary | ICD-10-CM | POA: Diagnosis not present

## 2023-01-14 DIAGNOSIS — K5120 Ulcerative (chronic) proctitis without complications: Secondary | ICD-10-CM | POA: Diagnosis not present

## 2023-01-14 DIAGNOSIS — R2689 Other abnormalities of gait and mobility: Secondary | ICD-10-CM | POA: Diagnosis not present

## 2023-01-14 DIAGNOSIS — M6281 Muscle weakness (generalized): Secondary | ICD-10-CM | POA: Diagnosis not present

## 2023-01-24 DIAGNOSIS — R194 Change in bowel habit: Secondary | ICD-10-CM | POA: Diagnosis not present

## 2023-01-30 DIAGNOSIS — Z8719 Personal history of other diseases of the digestive system: Secondary | ICD-10-CM | POA: Diagnosis not present

## 2023-02-11 DIAGNOSIS — M79672 Pain in left foot: Secondary | ICD-10-CM | POA: Diagnosis not present

## 2023-02-14 ENCOUNTER — Ambulatory Visit
Admission: RE | Admit: 2023-02-14 | Discharge: 2023-02-14 | Disposition: A | Discharge status: Home / Self Care | Payer: Medicare Other | Source: Ambulatory Visit | Attending: Gastroenterology | Admitting: Gastroenterology

## 2023-02-14 ENCOUNTER — Other Ambulatory Visit: Payer: Self-pay | Admitting: Gastroenterology

## 2023-02-14 DIAGNOSIS — R195 Other fecal abnormalities: Secondary | ICD-10-CM

## 2023-02-14 DIAGNOSIS — R197 Diarrhea, unspecified: Secondary | ICD-10-CM | POA: Diagnosis not present

## 2023-02-24 ENCOUNTER — Encounter: Payer: Self-pay | Admitting: Neurology

## 2023-02-24 ENCOUNTER — Ambulatory Visit: Payer: Medicare Other | Admitting: Neurology

## 2023-02-24 VITALS — BP 125/85 | HR 46 | Ht 70.0 in | Wt 229.5 lb

## 2023-02-24 DIAGNOSIS — R269 Unspecified abnormalities of gait and mobility: Secondary | ICD-10-CM | POA: Diagnosis not present

## 2023-02-24 DIAGNOSIS — G2581 Restless legs syndrome: Secondary | ICD-10-CM

## 2023-02-24 DIAGNOSIS — G6289 Other specified polyneuropathies: Secondary | ICD-10-CM

## 2023-02-24 NOTE — Progress Notes (Unsigned)
ASSESSMENT AND PLAN 82 y.o. year old male   1.  Peripheral neuropathy 2.  Restless leg syndrome 3.  Anemia-on iron supplement  -Increase in RLS symptoms, increase Requip, add 0.5 mg at 6 PM, continue 0.5 mg at bedtime -Continue Cymbalta 60 mg daily, we discussed moving this dose to the evening, for now he wishes to continue dosing in the morning -Continue Lyrica 100 mg, 4 capsules at bedtime, we discussed this is high dose, not much room for further increase -On Klonopin from PCP as needed for RLS symptoms, also on iron supplement -Follow-up in 6 months or sooner if needed  DIAGNOSTIC DATA (LABS, IMAGING, TESTING) - I reviewed patient records, labs, notes, testing and imaging myself where available.    HISTORY  [name] is a 82 yo RH male, accompanied by his wife, referred by Dr. Modesto Charon, and his primary care physician Dr. Uvaldo Rising for evaluation of chronic low back pain, bilateral lower extremity muscle ache  He had a past medical history of hypertension, hyperlipidemia, presenting with chronic low back pain, bilateral lower extremity muscle achy pain since 2010.   Initially it was thought due to his statin treatment, he has tried different statin without improving his symptoms,over the years, he also received multiple lumbar bilateral facet joint fluoroscopy guided injection without improving his symptoms.   In 2012, he was diagnosed with L4 and 5 lumbar stenosis, had lumbar laminectomy L3-4, L4-L5 by Dr. Jeral Fruit in July 2012,without improving his symptoms,  He was seen by different specialists, tried different medications, this including Elavil, Xanax as needed, he is currently taking hydrocodone 5/325 mg 1-2 tablets every night, Xanax 0.25 milligram as needed, Flexeril 10 mg every night, gabapentin 600 mg every night without significant improvement,  During the daytime, he denies significant low back pain, or bilateral lower extremity pain, he does has history of right ankle  fracture, mild gait difficulty due to right ankle pain, but starting at evening time, he began to noticed deep achy pain starting from midline low back, radiating along bilateral lateral leg, constant, he tends to pace around, has difficulty sleeping at nighttime, sometimes he has to got up take a hot bath couple times every night, put ice on his back, on his feet, only provide temporary relief.  He has also tried acupuncture, massage, TENS unit, without significant improvement, previously, he has a short trial of low-dose Requip, without improving his symptoms, no significant side effect noticed either.  He has bilateral lateral 3 toes numbness, no weakness, no bilateral fingertips numbness, or weakness  Per record, electrodiagnostic study failed to demonstrate significant etiology,  We have reviewed MRI lumbar in 2012, there was evidence of L4-5 spinal stenosis, and the most recent MRI  Lumbar was  in June 2014,progression of multilevel facet arthropathy, status post a laminectomy at L3-4, with residual large synovial cyst extending along the posterior epidural space without significant central canal stenosis, mild foraminal narrowing bilaterally at L3 and 4, progression of mild lateral recess narrowing bilaterally at L4-5, foraminal narrowing is stable at this level  MRI of the brain in June 2014 showed mild atrophy, no acute intracranial abnormality   UPDATE Dec 15th 2015:   He is tolerating gabapentin 300 mg 2 tablets every night, Requip 1 mg 2 tablets every night, he can sleep better with the medications, but continue complains of bilateral lower extremity dull achy pain, numbness, urgency to move, especially at nighttime, He also complains of slow progressive gait difficulty, grasps  things for support, he has urinary urgency, but no incontinence.   UPDATE Jan 29th 2016: He is now taking gabapentin 300mg   2 tabs qhs and requip 1mg , 3 tabs qhs, which has been very helpful,  30%, improvement, he  still has bilatarel leg pain at evening, stretching,massage,  He denies bowel and bladder incontinence , he has tinlging at the soles of his feet.    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   He continue complaining significant difficulty at nighttime, came in with a full page list of symptoms, in the morning time, he denies significant back pain, no low back pain, symptoms started in the late afternoon, gradually building up, around 9: to10 PM, he noticed achy feeling down lateral side of both leg, increased by sitting still, intense urge to lie on the floor, stretching muscle backwards, late night hot soaking bath to help him sleep, he has to take about 3 AM last night, difficulty sleeping, excessive fatigue during the daytime, could not sit through movies, standing up in the aisle 2/3 of the movie He is now taking, hydrocodone  5/325mg  2 tabs 9pm, advil 200mg  2 tab    He has quit taking Requip 1 mg, up to 4 tablets every night, also gabapentin, complains of upset stomach, without relieving his symptoms, he is now taking Advil 200 mg 2 tablets as needed, hydrocodone/Tylenol 5/325 mg 2 tablets every night for symptoms control, which only mild, temporarily, Tylenol helps some too.   UPDATE April 29th 2016: Laboratory showed low iron, ferritin level at 9, he was put on iron supplement, his restless leg symptoms has much improved, He is now taking clonazepam 0.5 milligram every night, Lyrica 50 mg every night, he can sleep much better,   He continue, combat right ankle pain   UPDATE August 2nd 2016: He sleeps well, he is now taking clonazepam 0.5mg  qhs, Lyrica 50mg  3 tabs qhs, no longer on   Hydrocodone, he complains of side effect   UPDATE August 06 2016: I reviewed operation record on April 18 2016, he had right lumbar L3-4, L4-5 anterolateral lumbar interbody fusion, percutaneous pedicle screw on April 18 2016, which has helped his left-sided low back pain radiating pain to left lower extremity, 4. Of time he was almost pain-free, unfortunately he fell landed on his right side, is then he began to experience excruciating radiating pain from left side to left anterior thigh.   We have personally reviewed and compared to MRI lumbar in October 2017, and in February 2018. Which showed pedicle screw and interbody fusion at L3-4, L4-5 without stenosis, small fluid collection posterior to the thecal sac at L3-4, moderate right foraminal narrowing, mild foraminal narrowing at L4-5.   He is planning on to have second left-sided lumbar decompression surgery on August 15 2016   His restless leg symptoms is under good control with current dose of Lyrica 100 mg twice a day, clonazepam as needed, oxycodone 5 mg every night   His most recent ferritin level was 47, hemoglobin was 11 point 5,   UPDATE August 07 2017: He is accompanied by his wife at today's clinical visit, complaining worsening restless leg symptoms, for a while, his restless leg  symptoms seems to be under okay control.   Since 2017, he had multiple surgery, lumbar fusion by Dr. Jennye Moccasin November 2017, redo in March 2018, left hip replacement in November 2018,   He has baseline gait abnormality, but no longer have significant pain, at nighttime, 1-2 hours after lying down, he has uncontrollable right leg muscle jerking, urged to move,   He is now taking Lyrica 100 mg 2 tablets at nighttime, clonazepam 0.5 mg every night, often have to take 1-1/2 tablets of Percocet 5/325 mg early morning time if he could not sleep his combination medication treatment.  Sometimes he has to take hot bath in the middle of the night to help him go to  sleep.   UPDATE Sept 9 2019: Now he is taking generic Lyric 100mg  3 tabs qhs, Requip 0.5 mg every night,  20% of time he is able to sleep without any difficulties, but 80% of the time about 1 to 2 hours after he goes to bed, he has severe leg discomfort, has to take a hot bath, then was able to sleep the rest of the night, sometimes he took clonazepam 0.5 mg half to 1 tablets before bedtime, complains of excessive drowsiness even to the point of wetting his bed,     His ferritin level is low 15 ng/ml March 2019, last colonoscopy was in 2017, hemoglobin in November 2018 prior to his left hip surgery was 13, he is now on iron supplement   UPDATE August 10 2018: Ferritin in Sept 2019 was 49.  He continues to have restless leg symptoms, halftime in a week, he has trouble sleeping due to restless leg symptoms, to get up to take a hot shower, which usually is very helpful, is taking Lyrica 100 in the morning, 200 mg at 2 hours prior to sleep, also take iron supplement,   UPDATE August 11 2019: He has lost few dear family member over short period of time, is feeling sad.  Around February 2021, he felt like his restless leg symptoms is under good control, decided to taper down Lyrica doses, instead of 100 mg 3 tablets every night, he tapered down to 100 mg every night, gradually increased difficulty sleeping, difficult to find a comfortable position for his leg, take hot bath, clonazepam 0.5 mg as needed was helpful last night, he also takes Cymbalta 60 mg daily, Requip 0.5 mg at bedtime   Laboratory evaluations in December 2020, normal CBC hemoglobin 14.3, CMP, creatinine of 1.2, ferritin was 15 August 2017, after iron supplement, improved to 49   CT abdomen in December 2020 showed no acute abnormality, large paraesophageal hiatal hernia, with approximately one half of the stomach present in the chest, diffuse colonic diverticulosis, without evidence of acute diverticulitis, mild diffuse hepatic steatosis,  marked prostate gland enlargement, with enhancing nodule arising from the superior portion of the median lobe of the prostate gland.   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 based on symptoms alone without looking at his skin lesion,   He denies a previous history of shingles, had a Shingrix vaccination end of 2020, did reported history  of left upper thoracic rash, itching few months ago   His restless leg symptoms is under good control with current regimen, Lyrica 100 mg 3 tablets every night, Requip 0.5 mg every night, Cymbalta 60 mg every morning, clonazepam 0.5 mg as needed   He has history of chronic low back pain, more to left side, had a history of lumbar decompression surgery in the past, left hip replacement in 2018, did have left hip injury within a week of replacement surgery, pain has gradually improved, also had a history of right ankle surgery, had gait abnormality.   UPDATE Sept 12 2022: He lost his wife in March 2022, going through grieving, had right knee replacement in June 2021, during the rehab, he was found to have dark stool, epigastric discomfort, using NSAIDs for right knee pain, also taking Eliquis for atrial fibrillation  Reviewed hospital record CT showed duodenitis, had active GI bleeding due to duodenal ulcer, hemoglobin of 8.9 required blood transfusion,   He is planning on moving to independent living, current medicine works well for his restless leg   He also complains of worsening bilateral feet numbness tingling   Update April 25, 2021: His  restless leg symptoms overall is under good control with his current medications, Lyrica 100 mg 3 tablets every night, clonazepam as needed, he rarely uses it, Cymbalta 60 mg every day,  He return for electrodiagnostic study today, which did show evidence of moderate axonal peripheral neuropathy, in addition there is evidence of chronic bilateral lumbosacral radiculopathy.  Reviewed laboratory evaluation in July 2022, when he was admitted to the hospital for GI bleeding, hemoglobin was 8.9, normal TSH, BMP, with creatinine of 1.27 calcium was 8.2, GFR of 56   Update December 18, 2021 SS: Labs in November 2022 A1c 5.6, MM panel negative, ANA negative, ferritin 54. Reports more tingling up to almost knee level.  Uses cane, has bad right knee, left hip. At times feels at risk for falling, variety of factors. Living at [facility]. Taking 400 mg Lyrica at night, Klonopin 0.5 mg PRN at bedtime only once a month, Cymbalta 60 mg daily, Requip 0.5 mg at bedtime. No more RLS symptoms. Takes iron tablet. Had virtual visit with PCP today for depression, no medications added.    Update March 12, 2022 SS: VV today, has been taking Lyrica 100 mg AM, 400 mg PM, this was helpful for neuropathy symptoms, he misunderstood instructions to spread out dosing. Still some tingling in legs, gait instability (this is not worse). Is sleeping well, still on Requip 0.5 mg at bedtime, Cymbalta 60 mg daily. Not using Klonopin at all. Is now sleeping great, he has a lady friend. Living at [facility] apartment for 5 months now. BP has been running 130/80's. Is still taking Iron, PCP has been following. No falls.  Update September 18, 2022 SS: Can feel tingling up to knees, on Cymbalta 60 mg daily, Lyrica 400-500 mg at bedtime, Requip 0.5 mg at 10 PM, Klonopin PRN from PCP. Still taking iron tablet. Itching to left thigh, left hip replacement 2019, immediately after, felt strong pain to left groin, x-ray was okay.  Since then left leg has shortened, getting heel lifts. Considering motor wheelchair, with walking gets pain to right hip. Is in independent living.  Walking with cane. At night tingling to his legs, his legs are restless. Itching to left lateral leg.   UPDATE Sept 23: Hiatal hernia with some GI issues, Gradual worsening pins and  needles at the sole of his feet, getting worse, at night, it feel twitching, still has difficulty gettign to sleep, try not to  use clonazepam, no low back pain, no UEs.  LEft his repalcment, right knee surgery,   Salt water pool move leg   PHYSICAL EXAM  Vitals:   02/24/23 1242  BP: 125/85  Pulse: (!) 46  Weight: 229 lb 8 oz (104.1 kg)  Height: 5\' 10"  (1.778 m)   Body mass index is 32.93 kg/m.  REVIEW OF SYSTEMS: Out of a complete 14 system review of symptoms, the patient complains only of the following symptoms, and all other reviewed systems are negative.  See HPI  ALLERGIES: Allergies  Allergen Reactions   Compazine [Prochlorperazine Edisylate] Other (See Comments)    EXTRAPYRAMIDAL MOVEMENT [Involuntary muscle movement]   Penicillins Nausea And Vomiting, Rash and Other (See Comments)        Welchol [Colesevelam Hcl] Other (See Comments)    MYALGIAS [Leg aches]   Other     General anesthesia - constipation    Gemfibrozil Nausea Only    PATIENT TOLERATES   Red Yeast Rice [Cholestin] Other (See Comments)    flushing    HOME MEDICATIONS: Outpatient Medications Prior to Visit  Medication Sig Dispense Refill   acetaminophen (TYLENOL) 325 MG tablet Take 2 tablets (650 mg total) by mouth every 6 (six) hours as needed for mild pain (or Fever >/= 101).     carvedilol (COREG) 3.125 MG tablet Take 3.125 mg by mouth 2 (two) times daily.     clonazePAM (KLONOPIN) 0.5 MG tablet take 1/2-1 TABLET BY MOUTH EVERYDAY AT BEDTIME AS NEEDED 20 tablet 0   ELIQUIS 5 MG TABS tablet Take 1 tablet (5 mg total) by mouth 2 (two) times daily. 180 tablet 3    Multiple Vitamins-Minerals (MULTIVITAMIN PO) Take 1 tablet by mouth daily.     nitroGLYCERIN (NITROSTAT) 0.4 MG SL tablet Place 0.4 mg under the tongue every 5 (five) minutes as needed for chest pain.     pantoprazole (PROTONIX) 40 MG tablet Take 1 tablet (40 mg total) by mouth 2 (two) times daily before a meal. (Patient taking differently: Take 40 mg by mouth 2 (two) times daily before a meal. Take 1 tablet by mouth twice a day 4d/week alternating with 1 tablet once a day 3d/week) 60 tablet 1   pregabalin (LYRICA) 100 MG capsule TAKE FOUR CAPSULES BY MOUTH AT bedtime (Patient taking differently: Take 100-400 mg by mouth See admin instructions. Take 100 mg in the morning and 400 mg at night) 120 capsule 5   rOPINIRole (REQUIP) 0.5 MG tablet TAKE ONE TABLET BY MOUTH EVERY EVENING 90 tablet 3   rosuvastatin (CRESTOR) 10 MG tablet TAKE 1 TABLET ONCE DAILY OR AS DIRECTED. (Patient taking differently: Take 10 mg by mouth every evening.) 90 tablet 2   tamsulosin (FLOMAX) 0.4 MG CAPS capsule Take 0.4 mg by mouth daily.     traMADol (ULTRAM) 50 MG tablet Take 1 tablet (50 mg total) by mouth every 6 (six) hours as needed. 20 tablet 0   bisacodyl (DULCOLAX) 5 MG EC tablet Take 2 tablets (10 mg total) by mouth daily as needed for moderate constipation. (Patient taking differently: Take 5 mg by mouth daily as needed for moderate constipation.) 30 tablet 1   docusate sodium (COLACE) 100 MG capsule Take 1 capsule (100 mg total) by mouth every 12 (twelve) hours. (Patient taking differently: Take 100 mg by mouth 2 (two) times daily  as needed for mild constipation.) 60 capsule 0   DULoxetine (CYMBALTA) 60 MG capsule Take 1 capsule (60 mg total) by mouth daily. 90 capsule 4   methocarbamol (ROBAXIN-750) 750 MG tablet Take 0.5 tablets (375 mg total) by mouth every 6 (six) hours as needed for muscle spasms. 20 tablet 0   Wheat Dextrin (BENEFIBER) POWD Take 1 Dose by mouth 3 (three) times daily. 1 dose = 1 teaspoons     No  facility-administered medications prior to visit.    PAST MEDICAL HISTORY: Past Medical History:  Diagnosis Date   Anginal pain (HCC)    NONE IN 3 YEARS   Arthritis    Atrial fibrillation (HCC) 1962   Chest pain    a. 1998 Cath: nl cors;  b. 2009 Cath: nl cors.   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 PETER [place]    Esophageal reflux    Essential hypertension    History of hiatal hernia    History of Paroxysmal atrial flutter (HCC)    Hyperlipidemia    Incontinence of urine    Leg pain, bilateral    Nocturia    PONV (postoperative nausea and vomiting)    CONSTIPATED   Scarlet fever 1945   Spinal stenosis    Staph skin infection    LEFT GROIN  04/11/16  TX W/ DOXYCYCLINE   Ulcerative proctitis (HCC)     PAST SURGICAL HISTORY: Past Surgical History:  Procedure Laterality Date   ANKLE RECONSTRUCTION Right 2007   [place]   ANTERIOR LAT LUMBAR FUSION Right 04/18/2016   Procedure: RIGHT LUMBAR THREE-FOUR, LUMBAR FOUR-FIVE ANTEROLATERAL LUMBAR INTERBODY FUSION;  Surgeon: Maeola Harman, MD;  Location: [facility] OR;  Service: Neurosurgery;  Laterality: Right;  RIGHT L3-4 L4-5 ANTEROLATERAL LUMBAR INTERBODY FUSION   BIOPSY  12/16/2020   Procedure: BIOPSY;  Surgeon: Kerin Salen, MD;  Location: [facility] END[facility];  Service: Gastroenterology;;   CARDIAC CATHETERIZATION  03/03/2008   Left heart cardiac catheterization and coronary   CARDIAC CATHETERIZATION  1997   Dr Donnie Aho   CATARACT EXTRACTION W/ INTRAOCULAR LENS  IMPLANT, BILATERAL     2016   COLONOSCOPY  2011   ESOPHAGEAL DILATION     2008   ESOPHAGOGASTRODUODENOSCOPY N/A 12/16/2020   Procedure: ESOPHAGOGASTRODUODENOSCOPY (EGD);  Surgeon: Kerin Salen, MD;  Location: [facility];  Service: Gastroenterology;  Laterality: N/A;   ESOPHAGOGASTRODUODENOSCOPY (EGD) WITH PROPOFOL N/A 10/15/2022   Procedure: ESOPHAGOGASTRODUODENOSCOPY (EGD) WITH PROPOFOL;  Surgeon:  Sherrilyn Rist, MD;  Location: [facility] ENDOSCOPY;  Service: Gastroenterology;  Laterality: N/A;   INNER EAR SURGERY     EAR INJECTION FOR MENIERES   INSERTION OF MESH N/A 12/31/2022   Procedure: INSERTION OF MESH;  Surgeon: Axel Filler, MD;  Location: [facility] OR;  Service: General;  Laterality: N/A;   KNEE ARTHROSCOPY Right 1991, 1995   LUMBAR LAMINECTOMY  12/2010   LUMBAR LAMINECTOMY/DECOMPRESSION MICRODISCECTOMY Left 08/15/2016   Procedure: Left Left three- four Redo laminectomy;  Surgeon: Maeola Harman, MD;  Location: [facility] OR;  Service: Neurosurgery;  Laterality: Left;  Left L3-4 Redo laminectomy   LUMBAR PERCUTANEOUS PEDICLE SCREW 2 LEVEL N/A 04/18/2016   Procedure: LUMBAR THREE-FOUR, LUMBAR FOUR-FIVE PERCUTANEOUS PEDICLE SCREW;  Surgeon: Maeola Harman, MD;  Location: [facility] OR;  Service: Neurosurgery;  Laterality: N/A;   TOTAL HIP ARTHROPLASTY Left 04/22/2017   TOTAL HIP ARTHROPLASTY Left 04/22/2017  Procedure: TOTAL HIP ARTHROPLASTY ANTERIOR APPROACH;  Surgeon: Marcene Corning, MD;  Location: [facility] OR;  Service: Orthopedics;  Laterality: Left;   TOTAL KNEE ARTHROPLASTY Right 11/21/2020   Procedure: RIGHT TOTAL KNEE ARTHROPLASTY;  Surgeon: Marcene Corning, MD;  Location: [facility] ORS;  Service: Orthopedics;  Laterality: Right;   XI ROBOTIC ASSISTED HIATAL HERNIA REPAIR N/A 12/31/2022   Procedure: XI ROBOTIC ASSISTED HIATAL HERNIA REPAIR WITH MESH AND FUNDOPLICATION;  Surgeon: Axel Filler, MD;  Location: [facility] OR;  Service: General;  Laterality: N/A;    FAMILY HISTORY: Family History  Problem Relation Age of Onset   Prostate cancer Father    Heart attack Brother    Kidney cancer Brother    Brain cancer Brother     SOCIAL HISTORY: Social History   Socioeconomic History   Marital status: Widowed    Spouse name: Darel Hong   Number of children: 3   Years of education: college   Highest education level: Not on file  Occupational History    Comment: Retired   Tobacco Use   Smoking status: Some Days     Types: Pipe   Smokeless tobacco: Never  Vaping Use   Vaping status: Never Used  Substance and Sexual Activity   Alcohol use: Yes    Alcohol/week: 7.0 standard drinks of alcohol    Types: 7 Shots of liquor per week   Drug use: No   Sexual activity: Not on file  Other Topics Concern   Not on file  Social History Narrative   Patient is retired and lives at home.   Education college.   Caffeine one cup daily.   Social Determinants of Health   Financial Resource Strain: Low Risk  (10/14/2022)   Overall Financial Resource Strain (CARDIA)    Difficulty of Paying Living Expenses: Not hard at all  Food Insecurity: Patient Declined (01/01/2023)   Hunger Vital Sign    Worried About Running Out of Food in the Last Year: Patient declined    Ran Out of Food in the Last Year: Patient declined  Transportation Needs: Patient Declined (01/01/2023)   PRAPARE - Administrator, Civil Service (Medical): Patient declined    Lack of Transportation (Non-Medical): Patient declined  Physical Activity: Sufficiently Active (10/14/2022)   Exercise Vital Sign    Days of Exercise per Week: 5 days    Minutes of Exercise per Session: 30 min  Stress: No Stress Concern Present (10/14/2022)   [employer] of Occupational Health - Occupational Stress Questionnaire    Feeling of Stress : Not at all  Social Connections: Moderately Integrated (10/14/2022)   Social Connection and Isolation Panel [NHANES]    Frequency of Communication with Friends and Family: Twice a week    Frequency of Social Gatherings with Friends and Family: Once a week    Attends Religious Services: More than 4 times per year    Active Member of [employer] or Organizations: No    Attends Banker Meetings: Never    Marital Status: Living with partner  Intimate Partner Violence: Patient Declined (01/01/2023)   Humiliation, Afraid, Rape, and Kick questionnaire    Fear of Current or Ex-Partner: Patient declined     Emotionally Abused: Patient declined    Physically Abused: Patient declined    Sexually Abused: Patient declined     Levert Feinstein, M.D. Ph.D.  [facility] [address] [place], [place] [address] Phone: [phone] Fax:      [phone]

## 2023-02-25 LAB — MULTIPLE MYELOMA PANEL, SERUM

## 2023-02-25 LAB — IRON,TIBC AND FERRITIN PANEL
Ferritin: 53 ng/mL (ref 30–400)
Iron Saturation: 28 % (ref 15–55)
Iron: 94 ug/dL (ref 38–169)
Total Iron Binding Capacity: 330 ug/dL (ref 250–450)
UIBC: 236 ug/dL (ref 111–343)

## 2023-02-25 LAB — TSH: TSH: 1.05 u[IU]/mL (ref 0.450–4.500)

## 2023-02-25 LAB — HGB A1C W/O EAG: Hgb A1c MFr Bld: 5.8 % — ABNORMAL HIGH (ref 4.8–5.6)

## 2023-03-08 ENCOUNTER — Encounter: Payer: Self-pay | Admitting: Neurology

## 2023-03-12 DIAGNOSIS — R194 Change in bowel habit: Secondary | ICD-10-CM | POA: Diagnosis not present

## 2023-03-16 ENCOUNTER — Other Ambulatory Visit: Payer: Self-pay | Admitting: Neurology

## 2023-03-17 DIAGNOSIS — R531 Weakness: Secondary | ICD-10-CM | POA: Diagnosis not present

## 2023-03-17 DIAGNOSIS — D509 Iron deficiency anemia, unspecified: Secondary | ICD-10-CM | POA: Diagnosis not present

## 2023-03-17 DIAGNOSIS — R634 Abnormal weight loss: Secondary | ICD-10-CM | POA: Diagnosis not present

## 2023-03-17 DIAGNOSIS — R195 Other fecal abnormalities: Secondary | ICD-10-CM | POA: Diagnosis not present

## 2023-03-17 DIAGNOSIS — R319 Hematuria, unspecified: Secondary | ICD-10-CM | POA: Diagnosis not present

## 2023-03-18 NOTE — Telephone Encounter (Signed)
Requested Prescriptions   Pending Prescriptions Disp Refills   pregabalin (LYRICA) 100 MG capsule [Pharmacy Med Name: pregabalin 100 mg capsule] 120 capsule 1    Sig: TAKE FOUR CAPSULES BY MOUTH AT BEDTIME.   Last seen 02/24/23 Next appt 09/10/23 Dispenses   Dispensed Days Supply Quantity Provider Pharmacy  pregabalin 100 mg capsule 01/28/2023 30 120 each Glean Salvo, NP [facility]...  PREGABALIN  100 MG CAPS 01/07/2023 12 60 capsule Hal Hope, FNP [facility]  pregabalin 100 mg capsule 12/25/2022 30 120 each Glean Salvo, NP [facility]...  pregabalin 100 mg capsule 11/25/2022 30 120 each Glean Salvo, NP [facility]...  pregabalin 100 mg capsule 10/24/2022 30 120 each Glean Salvo, NP [facility]...  pregabalin 100 mg capsule 09/25/2022 30 120 each Glean Salvo, NP [facility]...  pregabalin 100 mg capsule 08/13/2022 30 120 each Levert Feinstein, MD [facility]...  pregabalin 100 mg capsule 07/15/2022 30 120 each Levert Feinstein, MD [facility]...  pregabalin 100 mg capsule 06/17/2022 30 120 each Levert Feinstein, MD [facility]...  pregabalin 100 mg capsule 05/16/2022 30 120 each Levert Feinstein, MD [facility]...  pregabalin 100 mg capsule 04/17/2022 30 120 each Levert Feinstein, MD [facility].Marland KitchenMarland Kitchen

## 2023-03-20 DIAGNOSIS — Z0000 Encounter for general adult medical examination without abnormal findings: Secondary | ICD-10-CM | POA: Diagnosis not present

## 2023-04-01 ENCOUNTER — Ambulatory Visit: Payer: Medicare Other | Admitting: Neurology

## 2023-04-02 DIAGNOSIS — R300 Dysuria: Secondary | ICD-10-CM | POA: Diagnosis not present

## 2023-05-07 DIAGNOSIS — N1831 Chronic kidney disease, stage 3a: Secondary | ICD-10-CM | POA: Diagnosis not present

## 2023-05-07 DIAGNOSIS — E875 Hyperkalemia: Secondary | ICD-10-CM | POA: Diagnosis not present

## 2023-05-07 DIAGNOSIS — Z79899 Other long term (current) drug therapy: Secondary | ICD-10-CM | POA: Diagnosis not present

## 2023-05-09 DIAGNOSIS — R194 Change in bowel habit: Secondary | ICD-10-CM | POA: Diagnosis not present

## 2023-05-12 DIAGNOSIS — I10 Essential (primary) hypertension: Secondary | ICD-10-CM | POA: Diagnosis not present

## 2023-05-12 DIAGNOSIS — K219 Gastro-esophageal reflux disease without esophagitis: Secondary | ICD-10-CM | POA: Diagnosis not present

## 2023-05-12 DIAGNOSIS — N1831 Chronic kidney disease, stage 3a: Secondary | ICD-10-CM | POA: Diagnosis not present

## 2023-05-12 DIAGNOSIS — R194 Change in bowel habit: Secondary | ICD-10-CM | POA: Diagnosis not present

## 2023-05-12 DIAGNOSIS — I4819 Other persistent atrial fibrillation: Secondary | ICD-10-CM | POA: Diagnosis not present

## 2023-05-12 DIAGNOSIS — E782 Mixed hyperlipidemia: Secondary | ICD-10-CM | POA: Diagnosis not present

## 2023-05-12 DIAGNOSIS — D509 Iron deficiency anemia, unspecified: Secondary | ICD-10-CM | POA: Diagnosis not present

## 2023-05-12 DIAGNOSIS — Z79899 Other long term (current) drug therapy: Secondary | ICD-10-CM | POA: Diagnosis not present

## 2023-05-14 DIAGNOSIS — K5190 Ulcerative colitis, unspecified, without complications: Secondary | ICD-10-CM | POA: Diagnosis not present

## 2023-05-14 DIAGNOSIS — K6289 Other specified diseases of anus and rectum: Secondary | ICD-10-CM | POA: Diagnosis not present

## 2023-05-14 DIAGNOSIS — K5289 Other specified noninfective gastroenteritis and colitis: Secondary | ICD-10-CM | POA: Diagnosis not present

## 2023-05-14 DIAGNOSIS — K5730 Diverticulosis of large intestine without perforation or abscess without bleeding: Secondary | ICD-10-CM | POA: Diagnosis not present

## 2023-05-15 DIAGNOSIS — U071 COVID-19: Secondary | ICD-10-CM | POA: Diagnosis not present

## 2023-05-16 DIAGNOSIS — K5289 Other specified noninfective gastroenteritis and colitis: Secondary | ICD-10-CM | POA: Diagnosis not present

## 2023-06-05 ENCOUNTER — Other Ambulatory Visit: Payer: Self-pay | Admitting: Neurology

## 2023-06-05 NOTE — Telephone Encounter (Signed)
 Last seen on 02/24/23 Follow up scheduled on 09/10/23 Last filled on 04/30/23 #120 tablets (30 day supply) Rx pending to be signed

## 2023-06-16 ENCOUNTER — Emergency Department (HOSPITAL_COMMUNITY)
Admission: EM | Admit: 2023-06-16 | Discharge: 2023-06-16 | Disposition: A | Discharge status: Home / Self Care | Payer: Medicare Other | Attending: Emergency Medicine | Admitting: Emergency Medicine

## 2023-06-16 ENCOUNTER — Encounter (HOSPITAL_COMMUNITY): Payer: Self-pay | Admitting: Emergency Medicine

## 2023-06-16 ENCOUNTER — Emergency Department (HOSPITAL_COMMUNITY): Payer: Medicare Other

## 2023-06-16 ENCOUNTER — Other Ambulatory Visit: Payer: Self-pay

## 2023-06-16 DIAGNOSIS — R519 Headache, unspecified: Secondary | ICD-10-CM | POA: Insufficient documentation

## 2023-06-16 DIAGNOSIS — Z20822 Contact with and (suspected) exposure to covid-19: Secondary | ICD-10-CM | POA: Diagnosis not present

## 2023-06-16 DIAGNOSIS — Z8616 Personal history of COVID-19: Secondary | ICD-10-CM | POA: Insufficient documentation

## 2023-06-16 DIAGNOSIS — Z743 Need for continuous supervision: Secondary | ICD-10-CM | POA: Diagnosis not present

## 2023-06-16 DIAGNOSIS — I10 Essential (primary) hypertension: Secondary | ICD-10-CM | POA: Diagnosis not present

## 2023-06-16 DIAGNOSIS — Z7901 Long term (current) use of anticoagulants: Secondary | ICD-10-CM | POA: Insufficient documentation

## 2023-06-16 DIAGNOSIS — R6889 Other general symptoms and signs: Secondary | ICD-10-CM | POA: Diagnosis not present

## 2023-06-16 DIAGNOSIS — R197 Diarrhea, unspecified: Secondary | ICD-10-CM | POA: Diagnosis not present

## 2023-06-16 LAB — CBC WITH DIFFERENTIAL/PLATELET
Abs Immature Granulocytes: 0.01 10*3/uL (ref 0.00–0.07)
Basophils Absolute: 0 10*3/uL (ref 0.0–0.1)
Basophils Relative: 1 %
Eosinophils Absolute: 0.1 10*3/uL (ref 0.0–0.5)
Eosinophils Relative: 3 %
HCT: 46.9 % (ref 39.0–52.0)
Hemoglobin: 16 g/dL (ref 13.0–17.0)
Immature Granulocytes: 0 %
Lymphocytes Relative: 30 %
Lymphs Abs: 1.6 10*3/uL (ref 0.7–4.0)
MCH: 32.9 pg (ref 26.0–34.0)
MCHC: 34.1 g/dL (ref 30.0–36.0)
MCV: 96.3 fL (ref 80.0–100.0)
Monocytes Absolute: 0.5 10*3/uL (ref 0.1–1.0)
Monocytes Relative: 9 %
Neutro Abs: 3 10*3/uL (ref 1.7–7.7)
Neutrophils Relative %: 57 %
Platelets: 163 10*3/uL (ref 150–400)
RBC: 4.87 MIL/uL (ref 4.22–5.81)
RDW: 13.7 % (ref 11.5–15.5)
WBC: 5.3 10*3/uL (ref 4.0–10.5)
nRBC: 0 % (ref 0.0–0.2)

## 2023-06-16 LAB — RESP PANEL BY RT-PCR (RSV, FLU A&B, COVID)  RVPGX2
Influenza A by PCR: NEGATIVE
Influenza B by PCR: NEGATIVE
Resp Syncytial Virus by PCR: NEGATIVE
SARS Coronavirus 2 by RT PCR: NEGATIVE

## 2023-06-16 LAB — BASIC METABOLIC PANEL
Anion gap: 10 (ref 5–15)
BUN: 15 mg/dL (ref 8–23)
CO2: 26 mmol/L (ref 22–32)
Calcium: 9.2 mg/dL (ref 8.9–10.3)
Chloride: 99 mmol/L (ref 98–111)
Creatinine, Ser: 1.29 mg/dL — ABNORMAL HIGH (ref 0.61–1.24)
GFR, Estimated: 55 mL/min — ABNORMAL LOW (ref >60)
Glucose, Bld: 103 mg/dL — ABNORMAL HIGH (ref 70–99)
Potassium: 4.6 mmol/L (ref 3.5–5.1)
Sodium: 135 mmol/L (ref 135–145)

## 2023-06-16 LAB — PROTIME-INR
INR: 1.2 (ref 0.8–1.2)
Prothrombin Time: 15.3 s — ABNORMAL HIGH (ref 11.4–15.2)

## 2023-06-16 MED ORDER — ONDANSETRON HCL 4 MG/2ML IJ SOLN
4.0000 mg | Freq: Once | INTRAMUSCULAR | Status: AC
  Administered 2023-06-16: 4 mg via INTRAVENOUS
  Filled 2023-06-16: qty 2

## 2023-06-16 MED ORDER — ACETAMINOPHEN 500 MG PO TABS
1000.0000 mg | ORAL_TABLET | Freq: Once | ORAL | Status: AC
Start: 2023-06-16 — End: 2023-06-16
  Administered 2023-06-16: 1000 mg via ORAL
  Filled 2023-06-16: qty 2

## 2023-06-16 MED ORDER — IOHEXOL 350 MG/ML SOLN
75.0000 mL | Freq: Once | INTRAVENOUS | Status: AC | PRN
  Administered 2023-06-16: 75 mL via INTRAVENOUS

## 2023-06-16 MED ORDER — ACETAMINOPHEN 500 MG PO TABS: 1000.0000 mg | ORAL_TABLET | Freq: Once | ORAL | Status: DC

## 2023-06-16 MED ORDER — HYDROMORPHONE HCL 1 MG/ML IJ SOLN
0.5000 mg | Freq: Once | INTRAMUSCULAR | Status: AC
  Administered 2023-06-16: 0.5 mg via INTRAVENOUS
  Filled 2023-06-16: qty 1

## 2023-06-16 MED ORDER — TRAMADOL HCL 50 MG PO TABS: ORAL_TABLET | ORAL | 0 refills | Status: DC

## 2023-06-16 NOTE — Discharge Instructions (Signed)
 Your workup today was reassuring.  Please take Tylenol as needed for headache.  Please follow-up with your doctor and return to the ER for worsening symptoms.

## 2023-06-16 NOTE — ED Provider Notes (Signed)
 [place] EMERGENCY DEPARTMENT AT [facility] Provider Note   CSN: [id number] Arrival date & time: 06/16/23  0915     History  Chief Complaint  Patient presents with   Headache    Patient has left sided headache since last night, patient points to top of his head on the left side    [name] is a 83 y.o. male.  HPI Patient reports he had a bad headache started about 4 PM yesterday.  Patient is anticoagulated with Eliquis .  Took his morning Eliquis  today at 7 AM.  Headache has been constant since onset.  He denies any fall or injury.  No vomiting.  No focal weakness or numbness of an extremity.  Patient tested positive for COVID before Christmas but has not had ongoing symptoms.    Home Medications Prior to Admission medications   Medication Sig Start Date End Date Taking? Authorizing Provider  traMADol  (ULTRAM ) 50 MG tablet 1-2 tablets every 6 hours as needed with extra strength Tylenol . 06/16/23  Yes Sher Shampine, Ludivina, MD  acetaminophen  (TYLENOL ) 325 MG tablet Take 2 tablets (650 mg total) by mouth every 6 (six) hours as needed for mild pain (or Fever >/= 101). 12/18/20   Danton Reyes DASEN, MD  carvedilol  (COREG ) 3.125 MG tablet Take 3.125 mg by mouth 2 (two) times daily.    [provider]  clonazePAM  (KLONOPIN ) 0.5 MG tablet take 1/2-1 TABLET BY MOUTH EVERYDAY AT BEDTIME AS NEEDED 02/06/22   Gayland Lauraine PARAS, NP  ELIQUIS  5 MG TABS tablet Take 1 tablet (5 mg total) by mouth 2 (two) times daily. 07/15/22   Jordan, Peter M, MD  Multiple Vitamins-Minerals (MULTIVITAMIN PO) Take 1 tablet by mouth daily.    [provider]  nitroGLYCERIN  (NITROSTAT ) 0.4 MG SL tablet Place 0.4 mg under the tongue every 5 (five) minutes as needed for chest pain.    [provider]  pantoprazole  (PROTONIX ) 40 MG tablet Take 1 tablet (40 mg total) by mouth 2 (two) times daily before a meal. Patient taking differently: Take 40 mg by mouth 2 (two) times daily before a meal.  Take 1 tablet by mouth twice a day 4d/week alternating with 1 tablet once a day 3d/week 12/18/20   Danton Reyes DASEN, MD  pregabalin  (LYRICA ) 100 MG capsule TAKE FOUR CAPSULES BY MOUTH AT BEDTIME. 06/05/23   Onita Duos, MD  rOPINIRole  (REQUIP ) 0.5 MG tablet TAKE ONE TABLET BY MOUTH EVERY EVENING 12/25/22   Gayland Lauraine PARAS, NP  rosuvastatin  (CRESTOR ) 10 MG tablet TAKE 1 TABLET ONCE DAILY OR AS DIRECTED. Patient taking differently: Take 10 mg by mouth every evening. 04/22/17   Jordan, Peter M, MD  tamsulosin  (FLOMAX ) 0.4 MG CAPS capsule Take 0.4 mg by mouth daily. 12/08/18   [provider]  traMADol  (ULTRAM ) 50 MG tablet Take 1 tablet (50 mg total) by mouth every 6 (six) hours as needed. 01/02/23 01/02/24  Rubin Calamity, MD      Allergies    Compazine [prochlorperazine edisylate], Penicillins, Welchol [colesevelam hcl], Other, Gemfibrozil, and Red yeast rice [cholestin]    Review of Systems   Review of Systems  Physical Exam Updated Vital Signs BP (!) 136/91 (BP Location: Right Arm)   Pulse 65   Temp 98.1 F (36.7 C) (Oral)   Resp 18   SpO2 98%  Physical Exam Constitutional:      Comments: Alert nontoxic clear mental status.  Well-nourished well-developed.  Appears uncomfortable.  HENT:     Head:  Normocephalic and atraumatic.     Mouth/Throat:     Mouth: Mucous membranes are moist.     Pharynx: Oropharynx is clear.  Eyes:     Extraocular Movements: Extraocular movements intact.     Pupils: Pupils are equal, round, and reactive to light.  Cardiovascular:     Rate and Rhythm: Normal rate and regular rhythm.  Pulmonary:     Effort: Pulmonary effort is normal.     Breath sounds: Normal breath sounds.  Abdominal:     General: There is no distension.     Palpations: Abdomen is soft.     Tenderness: There is no abdominal tenderness. There is no guarding.  Musculoskeletal:        General: No swelling. Normal range of motion.     Cervical back: Neck supple.     Right lower leg:  No edema.     Left lower leg: No edema.  Skin:    General: Skin is warm and dry.  Neurological:     General: No focal deficit present.     Mental Status: He is oriented to person, place, and time.     Motor: No weakness.     Coordination: Coordination normal.     ED Results / Procedures / Treatments   Labs (all labs ordered are listed, but only abnormal results are displayed) Labs Reviewed  BASIC METABOLIC PANEL - Abnormal; Notable for the following components:      Result Value   Glucose, Bld 103 (*)    Creatinine, Ser 1.29 (*)    GFR, Estimated 55 (*)    All other components within normal limits  PROTIME-INR - Abnormal; Notable for the following components:   Prothrombin Time 15.3 (*)    All other components within normal limits  RESP PANEL BY RT-PCR (RSV, FLU A&B, COVID)  [id number]  CBC WITH DIFFERENTIAL/PLATELET    EKG EKG Interpretation Date/Time:  Monday June 16 2023 10:41:10 EST Ventricular Rate:  54 PR Interval:    QRS Duration:  90 QT Interval:  422 QTC Calculation: 400 R Axis:   -65  Text Interpretation: Atrial fibrillation with slow ventricular response Left axis deviation Low voltage QRS Inferior infarct , age undetermined Abnormal ECG When compared with ECG of 24-Nov-2022 15:46, PREVIOUS ECG IS PRESENT no sig change from previous Confirmed by Armenta Canning [phone]) on 06/16/2023 2:42:40 PM  Radiology CT Head Wo Contrast Result Date: 06/16/2023 CLINICAL DATA:  Headache, new onset (Age >= 51y) EXAM: CT HEAD WITHOUT CONTRAST TECHNIQUE: Contiguous axial images were obtained from the base of the skull through the vertex without intravenous contrast. RADIATION DOSE REDUCTION: This exam was performed according to the departmental dose-optimization program which includes automated exposure control, adjustment of the mA and/or kV according to patient size and/or use of iterative reconstruction technique. COMPARISON:  None Available. FINDINGS: Brain: No evidence of acute  infarction, hemorrhage, hydrocephalus, extra-axial collection or mass lesion/mass effect. Vascular: No hyperdense vessel or unexpected calcification. Skull: No acute fracture. Sinuses/Orbits: Clear sinuses.  No acute orbital findings. Other: No mastoid effusions. IMPRESSION: No evidence of acute intracranial abnormality. Electronically Signed   By: Gilmore GORMAN Molt M.D.   On: 06/16/2023 11:42    Procedures Procedures    Medications Ordered in ED Medications  HYDROmorphone  (DILAUDID ) injection 0.5 mg (0.5 mg Intravenous Given 06/16/23 1041)  ondansetron  (ZOFRAN ) injection 4 mg (4 mg Intravenous Given 06/16/23 1041)  HYDROmorphone  (DILAUDID ) injection 0.5 mg (0.5 mg Intravenous Given 06/16/23 1151)  acetaminophen  (TYLENOL ) tablet 1,000  mg (1,000 mg Oral Given 06/16/23 1307)  ondansetron  (ZOFRAN ) injection 4 mg (4 mg Intravenous Given 06/16/23 1327)    ED Course/ Medical Decision Making/ A&P                                 Medical Decision Making Amount and/or Complexity of Data Reviewed Labs: ordered. Radiology: ordered.  Risk OTC drugs. Prescription drug management.  Presents as outlined.  He reports a acute onset of headache at 4 PM yesterday.  Patient denies any headache history.  He is anticoagulated on Eliquis .  Mental status is clear.  Neurologic exam is intact.  Patient does not have any fall or trauma injury associated.  Differential diagnosis is for intracerebral hemorrhage\migraine or tension headache\other vascular headache\possible infectious source such as viral illness or sinusitis.  Will obtain CT scan of the head and basic lab work and monitoring.  Will administer Dilaudid  and Zofran  for pain.  CBC normal with normal differential.  Basic metabolic panel normal except BUN 15 creatinine 1.2 GFR 55.  CT head interpreted by radiology negative acute findings.  At this point, with no acute intracranial bleed due to Eliquis , will plan to test for influenza and COVID with recent  high community prevalence, if negative will proceed with CT angiogram to rule out aneurysm or dissection. After treatment with Dilaudid , acetaminophen  and Zofran , patient appears improved.  Headache is controlled.   Anticipate if all other diagnostic workup negative and patient remains comfortable with stable vital signs will be appropriate for discharge.        Final Clinical Impression(s) / ED Diagnoses Final diagnoses:  Bad headache    Rx / DC Orders ED Discharge Orders          Ordered    traMADol  (ULTRAM ) 50 MG tablet        06/16/23 1553              Armenta Canning, MD 06/16/23 1556

## 2023-06-16 NOTE — ED Provider Notes (Signed)
 The patient was signed out to me with viral testing pending.  The plan is for a CT angiogram if his viral panel is unremarkable to evaluate for structural abnormality such as aneurysm or dissection.  Physical Exam  BP 107/67   Pulse 62   Temp 98.1 F (36.7 C) (Oral)   Resp 18   SpO2 98%   Physical Exam General: No acute distress Neuro: No focal deficits, no meningismus  Procedures  Procedures  ED Course / MDM    Medical Decision Making Amount and/or Complexity of Data Reviewed Labs: ordered. Radiology: ordered.  Risk OTC drugs. Prescription drug management.   [name] viral testing here is negative.  CT angiogram is performed.  This is unremarkable.  On reassessment the patient states that he is having a very mild headache that he rates at a 2 or 3 out of 10.  This is significantly improved compared to when he got here.  I will give him an additional dose of Tylenol .  I think that he is stable for discharge.       Ula Prentice SAUNDERS, MD 06/16/23 [phone]

## 2023-06-17 DIAGNOSIS — R519 Headache, unspecified: Secondary | ICD-10-CM | POA: Diagnosis not present

## 2023-06-18 DIAGNOSIS — I10 Essential (primary) hypertension: Secondary | ICD-10-CM | POA: Diagnosis not present

## 2023-06-18 DIAGNOSIS — K449 Diaphragmatic hernia without obstruction or gangrene: Secondary | ICD-10-CM | POA: Diagnosis not present

## 2023-06-18 DIAGNOSIS — I4891 Unspecified atrial fibrillation: Secondary | ICD-10-CM | POA: Diagnosis not present

## 2023-06-19 DIAGNOSIS — R531 Weakness: Secondary | ICD-10-CM | POA: Diagnosis not present

## 2023-06-19 DIAGNOSIS — G629 Polyneuropathy, unspecified: Secondary | ICD-10-CM | POA: Diagnosis not present

## 2023-06-19 DIAGNOSIS — I10 Essential (primary) hypertension: Secondary | ICD-10-CM | POA: Diagnosis not present

## 2023-06-25 DIAGNOSIS — I4819 Other persistent atrial fibrillation: Secondary | ICD-10-CM | POA: Diagnosis not present

## 2023-06-25 DIAGNOSIS — K5120 Ulcerative (chronic) proctitis without complications: Secondary | ICD-10-CM | POA: Diagnosis not present

## 2023-06-25 DIAGNOSIS — G4489 Other headache syndrome: Secondary | ICD-10-CM | POA: Diagnosis not present

## 2023-07-07 NOTE — Progress Notes (Signed)
 [name] Date of Birth: 05-Mar-1941 Medical Record #[id number]  History of Present Illness: [name] is seen for evaluation of new onset Afib. He has a history of mixed hyperlipidemia, HTN, and obesity.  He has been intolerant to statins due to severe myalgias. This includes lipitor at a dose of 20 mg daily and Crestor  10 mg 3 days a week. Welchol apparently made no change in his lipid levels. Niacin was associated with severe flushing.   He has no history of vascular disease. He had normal cardiac caths in 1998 and 2009. No history of PAD or CVA/TIA.  He apparently had afib in 1962 but no documented recurrence afterwards.   He was seen by his PCP in 2020 for routine physical and found to be in Afib with controlled rate. Not symptomatic. Started on anticoagulation with Eliquis . Echo showed Normal LV and valvular function. Moderate LAE.  He underwent right TKR in June 2023.   In July 2022 he was admitted with GI bleed. Hgb down to 8.3. transfused one unit of blood. EGD showed a large 10 cm hiatal hernia and multiple duodenal ulcers. NSAIDs discontinued and started on PPI.   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 Had this reduced with EGD. In August he underwent surgical repair.   On follow up today he is doing well. Living at [facility]. Involved in  a lot of activities there. Last month he had severe HA and some confusion. Went to ED and cranial CT/CTA was normal. HA resolved. He is followed by Neuro for peripheral neuropathy. Overall he feels very well.   Current Outpatient Medications on File Prior to Visit  Medication Sig Dispense Refill   acetaminophen  (TYLENOL ) 325 MG tablet Take 2 tablets (650 mg total) by mouth every 6 (six) hours as needed for mild pain (or Fever >/= 101).     carvedilol  (COREG ) 3.125 MG tablet Take 3.125 mg by mouth 2 (two) times daily.     clonazePAM  (KLONOPIN ) 0.5 MG tablet take 1/2-1 TABLET BY MOUTH EVERYDAY AT BEDTIME AS  NEEDED 20 tablet 0   ELIQUIS  5 MG TABS tablet Take 1 tablet (5 mg total) by mouth 2 (two) times daily. 180 tablet 3   Multiple Vitamins-Minerals (MULTIVITAMIN PO) Take 1 tablet by mouth daily.     nitroGLYCERIN  (NITROSTAT ) 0.4 MG SL tablet Place 0.4 mg under the tongue every 5 (five) minutes as needed for chest pain.     pantoprazole  (PROTONIX ) 40 MG tablet Take 1 tablet (40 mg total) by mouth 2 (two) times daily before a meal. (Patient taking differently: Take 40 mg by mouth 2 (two) times daily before a meal. Take 1 tablet by mouth twice a day 4d/week alternating with 1 tablet once a day 3d/week) 60 tablet 1   pregabalin  (LYRICA ) 100 MG capsule TAKE FOUR CAPSULES BY MOUTH AT BEDTIME. 120 capsule 1   rOPINIRole  (REQUIP ) 0.5 MG tablet TAKE ONE TABLET BY MOUTH EVERY EVENING 90 tablet 3   rosuvastatin  (CRESTOR ) 10 MG tablet TAKE 1 TABLET ONCE DAILY OR AS DIRECTED. (Patient taking differently: Take 10 mg by mouth every evening.) 90 tablet 2   tamsulosin  (FLOMAX ) 0.4 MG CAPS capsule Take 0.4 mg by mouth daily.     traMADol  (ULTRAM ) 50 MG tablet 1-2 tablets every 6 hours as needed with extra strength Tylenol . 15 tablet 0   No current facility-administered medications on file prior to visit.    Allergies  Allergen Reactions  Compazine [Prochlorperazine Edisylate] Other (See Comments)    EXTRAPYRAMIDAL MOVEMENT [Involuntary muscle movement]   Penicillins Nausea And Vomiting, Rash and Other (See Comments)        Welchol [Colesevelam Hcl] Other (See Comments)    MYALGIAS [Leg aches]   Other     General anesthesia - constipation    Gemfibrozil Nausea Only    PATIENT TOLERATES   Red Yeast Rice [Cholestin] Other (See Comments)    flushing    Past Medical History:  Diagnosis Date   Anginal pain (HCC)    NONE IN 3 YEARS   Arthritis    Atrial fibrillation (HCC) 1962   Chest pain    a. 1998 Cath: nl cors;  b. 2009 Cath: nl cors.   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 Dameka Younker    Esophageal reflux    Essential hypertension    History of hiatal hernia    History of Paroxysmal atrial flutter (HCC)    Hyperlipidemia    Incontinence of urine    Leg pain, bilateral    Nocturia    PONV (postoperative nausea and vomiting)    CONSTIPATED   Scarlet fever 1945   Spinal stenosis    Staph skin infection    LEFT GROIN  04/11/16  TX W/ DOXYCYCLINE    Ulcerative proctitis (HCC)     Past Surgical History:  Procedure Laterality Date   ANKLE RECONSTRUCTION Right 2007   [place]   ANTERIOR LAT LUMBAR FUSION Right 04/18/2016   Procedure: RIGHT LUMBAR THREE-FOUR, LUMBAR FOUR-FIVE ANTEROLATERAL LUMBAR INTERBODY FUSION;  Surgeon: Fairy Levels, MD;  Location: [facility] OR;  Service: Neurosurgery;  Laterality: Right;  RIGHT L3-4 L4-5 ANTEROLATERAL LUMBAR INTERBODY FUSION   BIOPSY  12/16/2020   Procedure: BIOPSY;  Surgeon: Saintclair Jasper, MD;  Location: [facility];  Service: Gastroenterology;;   CARDIAC CATHETERIZATION  03/03/2008   Left heart cardiac catheterization and coronary   CARDIAC CATHETERIZATION  1997   Dr Blanca   CATARACT EXTRACTION W/ INTRAOCULAR LENS  IMPLANT, BILATERAL     2016   COLONOSCOPY  2011   ESOPHAGEAL DILATION     2008   ESOPHAGOGASTRODUODENOSCOPY N/A 12/16/2020   Procedure: ESOPHAGOGASTRODUODENOSCOPY (EGD);  Surgeon: Saintclair Jasper, MD;  Location: [facility];  Service: Gastroenterology;  Laterality: N/A;   ESOPHAGOGASTRODUODENOSCOPY (EGD) WITH PROPOFOL  N/A 10/15/2022   Procedure: ESOPHAGOGASTRODUODENOSCOPY (EGD) WITH PROPOFOL ;  Surgeon: Legrand Victory LITTIE DOUGLAS, MD;  Location: [facility] END[facility]COPY;  Service: Gastroenterology;  Laterality: N/A;   INNER EAR SURGERY     EAR INJECTION FOR MENIERES   INSERTION OF MESH N/A 12/31/2022   Procedure: INSERTION OF MESH;  Surgeon: Rubin Calamity, MD;  [facility] OR;  Service: General;  Laterality: N/A;   KNEE ARTHROSCOPY Right 1991, 1995   LUMBAR LAMINECTOMY   12/2010   LUMBAR LAMINECTOMY/DECOMPRESSION MICRODISCECTOMY Left 08/15/2016   Procedure: Left Left three- four Redo laminectomy;  Surgeon: Fairy Levels, MD;  Location: [facility] OR;  Service: Neurosurgery;  Laterality: Left;  Left L3-4 Redo laminectomy   LUMBAR PERCUTANEOUS PEDICLE SCREW 2 LEVEL N/A 04/18/2016   Procedure: LUMBAR THREE-FOUR, LUMBAR FOUR-FIVE PERCUTANEOUS PEDICLE SCREW;  Surgeon: Fairy Levels, MD;  Location: [facility] OR;  Service: Neurosurgery;  Laterality: N/A;   TOTAL HIP ARTHROPLASTY Left 04/22/2017   TOTAL HIP ARTHROPLASTY Left 04/22/2017   Procedure: TOTAL HIP ARTHROPLASTY ANTERIOR APPROACH;  Surgeon: Sheril Coy,  MD;  Location: [facility] OR;  Service: Orthopedics;  Laterality: Left;   TOTAL KNEE ARTHROPLASTY Right 11/21/2020   Procedure: RIGHT TOTAL KNEE ARTHROPLASTY;  Surgeon: Sheril Coy, MD;  Location: [facility];  Service: Orthopedics;  Laterality: Right;   XI ROBOTIC ASSISTED HIATAL HERNIA REPAIR N/A 12/31/2022   Procedure: XI ROBOTIC ASSISTED HIATAL HERNIA REPAIR WITH MESH AND FUNDOPLICATION;  Surgeon: Rubin Calamity, MD;  Location: [facility] OR;  Service: General;  Laterality: N/A;    Social History   Tobacco Use  Smoking Status Some Days   Types: Pipe  Smokeless Tobacco Never    Social History   Substance and Sexual Activity  Alcohol Use Yes   Alcohol/week: 7.0 standard drinks of alcohol   Types: 7 Shots of liquor per week    Family History  Problem Relation Age of Onset   Prostate cancer Father    Heart attack Brother    Kidney cancer Brother    Brain cancer Brother     Review of Systems: As noted in HPI.  All other systems were reviewed and are negative.  Physical Exam: BP 130/82   Pulse 85   Ht 5' 10 (1.778 m)   Wt 224 lb (101.6 kg)   SpO2 96%   BMI 32.14 kg/m  GENERAL:  Well appearing WM in NAD. Walks with a cane HEENT:  PERRL, EOMI, sclera are clear. Oropharynx is clear. NECK:  No jugular venous distention, carotid upstroke brisk and symmetric, no  bruits, no thyromegaly or adenopathy LUNGS:  Clear to auscultation bilaterally CHEST:  Unremarkable HEART:  IRRR,  PMI not displaced or sustained,S1 and S2 within normal limits, no S3, no S4: no clicks, no rubs, no murmurs ABD:  Soft, nontender. BS +, no masses or bruits. No hepatomegaly, no splenomegaly EXT:  2 + pulses throughout, no edema, no cyanosis no clubbing SKIN:  Warm and dry.  No rashes NEURO:  Alert and oriented x 3. Cranial nerves II through XII intact. PSYCH:  Cognitively intact  LABORATORY DATA: Lab Results  Component Value Date   WBC 5.3 06/16/2023   HGB 16.0 06/16/2023   HCT 46.9 06/16/2023   PLT 163 06/16/2023   GLUCOSE 103 (H) 06/16/2023   CHOL 90 (L) 01/13/2017   TRIG 138 01/13/2017   HDL 29 (L) 01/13/2017   LDLCALC 33 01/13/2017   ALT 23 12/24/2022   AST 34 12/24/2022   NA 135 06/16/2023   K 4.6 06/16/2023   CL 99 06/16/2023   CREATININE 1.29 (H) 06/16/2023   BUN 15 06/16/2023   CO2 26 06/16/2023   TSH 1.050 02/24/2023   INR 1.2 06/16/2023   HGBA1C 5.8 (H) 02/24/2023   Labs dated 12/26/16: A1c 5.8%, Hgb 14.2, creatinine 1.21. TSH normal.  Dated 07/07/18: A1c 5.6%. creatinine 1.34. potassium 5.5. TSH normal. CBC normal. Dated 01/04/19: A1c 5.7% Dated 06/03/19: creatinine 1.32. CMET and CBC normal.  Dated 09/06/20: cholesterol 169, triglycerides 169, HDL 29, LDL 110, A1c 5.5%. creatinine 1.4. otherwise CMET normal. CBC normal. Dated 03/08/21: cholesterol 94, triglycerides 97, HDL 32, LDL 44. A1c 5.6%. creatinine 1.4. otherwise CBC, CMET and TSH normal. Dated 04/09/22: A1c 5.3%. creatinine 1.47. otherwise chemistries normal. Hgb 12.5.  Dated 5/10-/24: cholesterol 109, triglycerides 169, HDL 30, LDL 50.   Ecg not done today  Echo 07/09/18: IMPRESSIONS      1. The left ventricle has normal systolic function of 55-60%. The cavity size was normal. There is no increased left ventricular wall thickness. Left ventricular diastology could not be  evaluated secondary to  atrial fibrillation.  2. The right ventricle has normal systolic function. The cavity was mildly enlarged. There is no increase in right ventricular wall thickness.  3. Left atrial size was moderately dilated.  4. The mitral valve is normal in structure.  5. The tricuspid valve is normal in structure.  6. The aortic valve is normal in structure. Aortic valve regurgitation is mild by color flow Doppler.  7. The pulmonic valve was normal in structure.  8. There is mild dilatation of the ascending aorta.   FINDINGS  Left Ventricle: The left ventricle has normal systolic function of 55-60%. The cavity size was normal. There is no increased left ventricular wall thickness. Left ventricular diastology could not be evaluated secondary to atrial fibrillation. Right Ventricle: The right ventricle has normal systolic function. The cavity was mildly enlarged. There is no increase in right ventricular wall thickness. Left Atrium: left atrial size was moderately dilated Right Atrium: right atrial size was normal in size Interatrial Septum: No atrial level shunt detected by color flow Doppler.   Assessment / Plan: 1. Atrial fibrillation- persistent/ permanent.  He is asymptomatic. Rate controlled on low dose Coreg . Now on Eliquis  for anticoagulation. CHAD Vasc of 2. Continue current therapy  2. Mixed hyperlipidemia. Now on Crestor  labs followed by PCP  3. HTN- well controlled.   4. Normal cardiac cath 2009.  5.  History of GI bleed secondary to duodenal ulcers.   I will follow up in one year

## 2023-07-11 ENCOUNTER — Encounter: Payer: Self-pay | Admitting: Cardiology

## 2023-07-11 ENCOUNTER — Ambulatory Visit: Payer: Medicare Other | Attending: Cardiology | Admitting: Cardiology

## 2023-07-11 VITALS — BP 130/82 | HR 85 | Ht 70.0 in | Wt 224.0 lb

## 2023-07-11 DIAGNOSIS — I10 Essential (primary) hypertension: Secondary | ICD-10-CM | POA: Diagnosis not present

## 2023-07-11 DIAGNOSIS — E782 Mixed hyperlipidemia: Secondary | ICD-10-CM | POA: Diagnosis not present

## 2023-07-11 DIAGNOSIS — I4821 Permanent atrial fibrillation: Secondary | ICD-10-CM

## 2023-07-11 NOTE — Patient Instructions (Signed)
 Medication Instructions:  Continue same medications *If you need a refill on your cardiac medications before your next appointment, please call your pharmacy*   Lab Work: None ordered   Testing/Procedures: None ordered   Follow-Up: At [facility], you and your health needs are our priority.  As part of our continuing mission to provide you with exceptional heart care, we have created designated Provider Care Teams.  These Care Teams include your primary Cardiologist (physician) and Advanced Practice Providers (APPs -  Physician Assistants and Nurse Practitioners) who all work together to provide you with the care you need, when you need it.  We recommend signing up for the patient portal called "MyChart".  Sign up information is provided on this After Visit Summary.  MyChart is used to connect with patients for Virtual Visits (Telemedicine).  Patients are able to view lab/test results, encounter notes, upcoming appointments, etc.  Non-urgent messages can be sent to your provider as well.   To learn more about what you can do with MyChart, go to [email or web address].    Your next appointment:  1 year    Call in Oct to schedule Feb appointment     Provider: Dr.Jordan

## 2023-07-21 DIAGNOSIS — K529 Noninfective gastroenteritis and colitis, unspecified: Secondary | ICD-10-CM | POA: Diagnosis not present

## 2023-07-26 ENCOUNTER — Other Ambulatory Visit: Payer: Self-pay | Admitting: Cardiology

## 2023-07-28 NOTE — Telephone Encounter (Signed)
 Prescription refill request for Eliquis received. Indication:afib Last office visit:2/25 Scr:1.29  1/25 Age: 83 Weight:101.6  kg  Prescription refilled

## 2023-07-29 ENCOUNTER — Ambulatory Visit: Payer: Medicare Other | Admitting: Internal Medicine

## 2023-07-29 VITALS — BP 122/80 | HR 82 | Temp 97.7°F | Resp 18 | Ht 70.0 in | Wt 224.0 lb

## 2023-07-29 DIAGNOSIS — R5081 Fever presenting with conditions classified elsewhere: Secondary | ICD-10-CM | POA: Diagnosis not present

## 2023-07-29 DIAGNOSIS — J399 Disease of upper respiratory tract, unspecified: Secondary | ICD-10-CM | POA: Insufficient documentation

## 2023-07-29 DIAGNOSIS — R509 Fever, unspecified: Secondary | ICD-10-CM

## 2023-07-29 LAB — POC COVID19 BINAXNOW: SARS Coronavirus 2 Ag: NEGATIVE

## 2023-07-29 LAB — POCT INFLUENZA A/B
Influenza A, POC: NEGATIVE
Influenza B, POC: NEGATIVE

## 2023-07-29 MED ORDER — FEXOFENADINE HCL 60 MG PO TABS: 60.0000 mg | ORAL_TABLET | Freq: Two times a day (BID) | ORAL | 0 refills | Status: DC

## 2023-07-29 MED ORDER — OXYMETAZOLINE HCL 0.05 % NA SOLN: 1.0000 | Freq: Two times a day (BID) | NASAL | 0 refills | Status: DC

## 2023-07-29 NOTE — Progress Notes (Signed)
   Acute Office Visit  Subjective:     Patient ID: [name], male    DOB: Mar 11, 1941, 83 y.o.   MRN: [id number]  Chief Complaint  Patient presents with   office visit    Flu like symptoms for 4 days     HPI Patient is in today for running nose and not feeling well for 4 days. No appetite. He force himself to drink water. No facial pressure. He has low grade fever and chills. But his flu test is negative.  He says that he has not felt like this before. He has a hiatal hernia surgery done and he has diarrhea for that he follows with his surgeon.     Review of Systems  Constitutional: Negative.   HENT:  Positive for congestion.   Respiratory:  Positive for cough. Negative for shortness of breath and wheezing.   Gastrointestinal:  Positive for diarrhea.  Musculoskeletal:  Positive for myalgias.        Objective:    BP 122/80 (BP Location: Left Arm, Patient Position: Sitting, Cuff Size: Normal)   Pulse 82   Temp 97.7 F (36.5 C)   Resp 18   Ht 5\' 10"  (1.778 m)   Wt 224 lb (101.6 kg)   SpO2 96%   BMI 32.14 kg/m       Physical Exam Constitutional:      Appearance: Normal appearance.  HENT:     Head: Normocephalic and atraumatic.  Cardiovascular:     Rate and Rhythm: Normal rate and regular rhythm.     Heart sounds: Normal heart sounds.  Pulmonary:     Effort: Pulmonary effort is normal.     Breath sounds: Normal breath sounds.  Abdominal:     General: Bowel sounds are normal.     Palpations: Abdomen is soft.  Neurological:     Mental Status: He is alert.     Results for orders placed or performed in visit on 07/29/23  POCT Influenza A/B  Result Value Ref Range   Influenza A, POC Negative Negative   Influenza B, POC Negative Negative        Assessment & Plan:   Problem List Items Addressed This Visit       Respiratory   Upper respiratory disease   His COVID and influenza test both are negative.  His lungs are clear.  I have suggested to  take Afrin to each nose twice a day and take antihistamine.  He will drink plenty of water to keep himself well-hydrated.  If he is not better then he will call his primary care doctor or will call us.        Other   Fever - Primary   Relevant Orders   POCT Influenza A/B (Completed)   POC COVID-19 BinaxNow (Completed)    No orders of the defined types were placed in this encounter.   No follow-ups on file.  Eloisa Northern, MD

## 2023-07-29 NOTE — Assessment & Plan Note (Signed)
 His COVID and influenza test both are negative.  His lungs are clear.  I have suggested to take Afrin to each nose twice a day and take antihistamine.  He will drink plenty of water to keep himself well-hydrated.  If he is not better then he will call his primary care doctor or will call us.

## 2023-07-30 ENCOUNTER — Observation Stay (HOSPITAL_COMMUNITY)
Admission: EM | Admit: 2023-07-30 | Discharge: 2023-07-31 | Disposition: A | Discharge status: Home / Self Care | Payer: Medicare Other | Attending: Family Medicine | Admitting: Family Medicine

## 2023-07-30 ENCOUNTER — Observation Stay (HOSPITAL_COMMUNITY): Payer: Medicare Other

## 2023-07-30 ENCOUNTER — Encounter (HOSPITAL_COMMUNITY): Payer: Self-pay | Admitting: Emergency Medicine

## 2023-07-30 ENCOUNTER — Emergency Department (HOSPITAL_COMMUNITY): Payer: Medicare Other

## 2023-07-30 ENCOUNTER — Other Ambulatory Visit: Payer: Self-pay

## 2023-07-30 DIAGNOSIS — K529 Noninfective gastroenteritis and colitis, unspecified: Secondary | ICD-10-CM | POA: Insufficient documentation

## 2023-07-30 DIAGNOSIS — G9009 Other idiopathic peripheral autonomic neuropathy: Secondary | ICD-10-CM | POA: Diagnosis not present

## 2023-07-30 DIAGNOSIS — I129 Hypertensive chronic kidney disease with stage 1 through stage 4 chronic kidney disease, or unspecified chronic kidney disease: Secondary | ICD-10-CM | POA: Insufficient documentation

## 2023-07-30 DIAGNOSIS — I4819 Other persistent atrial fibrillation: Secondary | ICD-10-CM | POA: Diagnosis not present

## 2023-07-30 DIAGNOSIS — Z79899 Other long term (current) drug therapy: Secondary | ICD-10-CM | POA: Insufficient documentation

## 2023-07-30 DIAGNOSIS — R29818 Other symptoms and signs involving the nervous system: Secondary | ICD-10-CM | POA: Diagnosis not present

## 2023-07-30 DIAGNOSIS — G319 Degenerative disease of nervous system, unspecified: Secondary | ICD-10-CM | POA: Diagnosis not present

## 2023-07-30 DIAGNOSIS — H532 Diplopia: Secondary | ICD-10-CM | POA: Diagnosis not present

## 2023-07-30 DIAGNOSIS — N400 Enlarged prostate without lower urinary tract symptoms: Secondary | ICD-10-CM | POA: Insufficient documentation

## 2023-07-30 DIAGNOSIS — R410 Disorientation, unspecified: Secondary | ICD-10-CM | POA: Diagnosis not present

## 2023-07-30 DIAGNOSIS — E785 Hyperlipidemia, unspecified: Secondary | ICD-10-CM | POA: Diagnosis not present

## 2023-07-30 DIAGNOSIS — Z96651 Presence of right artificial knee joint: Secondary | ICD-10-CM | POA: Diagnosis not present

## 2023-07-30 DIAGNOSIS — G459 Transient cerebral ischemic attack, unspecified: Principal | ICD-10-CM | POA: Insufficient documentation

## 2023-07-30 DIAGNOSIS — N1831 Chronic kidney disease, stage 3a: Secondary | ICD-10-CM | POA: Diagnosis not present

## 2023-07-30 DIAGNOSIS — R55 Syncope and collapse: Secondary | ICD-10-CM | POA: Diagnosis not present

## 2023-07-30 DIAGNOSIS — Z743 Need for continuous supervision: Secondary | ICD-10-CM | POA: Diagnosis not present

## 2023-07-30 DIAGNOSIS — Z96642 Presence of left artificial hip joint: Secondary | ICD-10-CM | POA: Insufficient documentation

## 2023-07-30 DIAGNOSIS — I700 Atherosclerosis of aorta: Secondary | ICD-10-CM | POA: Diagnosis not present

## 2023-07-30 DIAGNOSIS — F32A Depression, unspecified: Secondary | ICD-10-CM | POA: Insufficient documentation

## 2023-07-30 DIAGNOSIS — K269 Duodenal ulcer, unspecified as acute or chronic, without hemorrhage or perforation: Secondary | ICD-10-CM | POA: Insufficient documentation

## 2023-07-30 DIAGNOSIS — K449 Diaphragmatic hernia without obstruction or gangrene: Secondary | ICD-10-CM | POA: Insufficient documentation

## 2023-07-30 DIAGNOSIS — H539 Unspecified visual disturbance: Secondary | ICD-10-CM | POA: Diagnosis not present

## 2023-07-30 DIAGNOSIS — Z7901 Long term (current) use of anticoagulants: Secondary | ICD-10-CM | POA: Insufficient documentation

## 2023-07-30 DIAGNOSIS — R231 Pallor: Secondary | ICD-10-CM | POA: Diagnosis not present

## 2023-07-30 DIAGNOSIS — R590 Localized enlarged lymph nodes: Secondary | ICD-10-CM | POA: Diagnosis not present

## 2023-07-30 DIAGNOSIS — B349 Viral infection, unspecified: Secondary | ICD-10-CM | POA: Diagnosis not present

## 2023-07-30 LAB — URINALYSIS, ROUTINE W REFLEX MICROSCOPIC
Bilirubin Urine: NEGATIVE
Glucose, UA: NEGATIVE mg/dL
Hgb urine dipstick: NEGATIVE
Ketones, ur: NEGATIVE mg/dL
Leukocytes,Ua: NEGATIVE
Nitrite: NEGATIVE
Protein, ur: NEGATIVE mg/dL
Specific Gravity, Urine: 1.015 (ref 1.005–1.030)
pH: 5 (ref 5.0–8.0)

## 2023-07-30 LAB — RAPID URINE DRUG SCREEN, HOSP PERFORMED
Amphetamines: NOT DETECTED
Barbiturates: NOT DETECTED
Benzodiazepines: NOT DETECTED
Cocaine: NOT DETECTED
Opiates: NOT DETECTED
Tetrahydrocannabinol: NOT DETECTED

## 2023-07-30 LAB — COMPREHENSIVE METABOLIC PANEL
ALT: 35 U/L (ref 0–44)
AST: 46 U/L — ABNORMAL HIGH (ref 15–41)
Albumin: 3.8 g/dL (ref 3.5–5.0)
Alkaline Phosphatase: 51 U/L (ref 38–126)
Anion gap: 11 (ref 5–15)
BUN: 20 mg/dL (ref 8–23)
CO2: 23 mmol/L (ref 22–32)
Calcium: 9.1 mg/dL (ref 8.9–10.3)
Chloride: 101 mmol/L (ref 98–111)
Creatinine, Ser: 1.46 mg/dL — ABNORMAL HIGH (ref 0.61–1.24)
GFR, Estimated: 47 mL/min — ABNORMAL LOW (ref >60)
Glucose, Bld: 91 mg/dL (ref 70–99)
Potassium: 4 mmol/L (ref 3.5–5.1)
Sodium: 135 mmol/L (ref 135–145)
Total Bilirubin: 1.1 mg/dL (ref 0.0–1.2)
Total Protein: 6.5 g/dL (ref 6.5–8.1)

## 2023-07-30 LAB — CBC
HCT: 44.5 % (ref 39.0–52.0)
Hemoglobin: 15.2 g/dL (ref 13.0–17.0)
MCH: 33.3 pg (ref 26.0–34.0)
MCHC: 34.2 g/dL (ref 30.0–36.0)
MCV: 97.6 fL (ref 80.0–100.0)
Platelets: 166 10*3/uL (ref 150–400)
RBC: 4.56 MIL/uL (ref 4.22–5.81)
RDW: 14.3 % (ref 11.5–15.5)
WBC: 8.2 10*3/uL (ref 4.0–10.5)
nRBC: 0 % (ref 0.0–0.2)

## 2023-07-30 LAB — DIFFERENTIAL
Abs Immature Granulocytes: 0.03 10*3/uL (ref 0.00–0.07)
Basophils Absolute: 0.1 10*3/uL (ref 0.0–0.1)
Basophils Relative: 1 %
Eosinophils Absolute: 0.1 10*3/uL (ref 0.0–0.5)
Eosinophils Relative: 1 %
Immature Granulocytes: 0 %
Lymphocytes Relative: 20 %
Lymphs Abs: 1.6 10*3/uL (ref 0.7–4.0)
Monocytes Absolute: 0.9 10*3/uL (ref 0.1–1.0)
Monocytes Relative: 11 %
Neutro Abs: 5.5 10*3/uL (ref 1.7–7.7)
Neutrophils Relative %: 67 %

## 2023-07-30 LAB — PROTIME-INR
INR: 1.2 (ref 0.8–1.2)
Prothrombin Time: 15.9 s — ABNORMAL HIGH (ref 11.4–15.2)

## 2023-07-30 LAB — HEMOGLOBIN A1C
Hgb A1c MFr Bld: 5.7 % — ABNORMAL HIGH (ref 4.8–5.6)
Mean Plasma Glucose: 116.89 mg/dL

## 2023-07-30 LAB — ETHANOL: Alcohol, Ethyl (B): <10 mg/dL (ref <10)

## 2023-07-30 LAB — APTT: aPTT: 38 s — ABNORMAL HIGH (ref 24–36)

## 2023-07-30 MED ORDER — ROSUVASTATIN CALCIUM 5 MG PO TABS: 10.0000 mg | ORAL_TABLET | Freq: Every evening | ORAL | Status: DC

## 2023-07-30 MED ORDER — ROPINIROLE HCL 1 MG PO TABS
0.5000 mg | ORAL_TABLET | Freq: Every day | ORAL | Status: DC
  Administered 2023-07-30: 0.5 mg via ORAL
  Filled 2023-07-30: qty 1

## 2023-07-30 MED ORDER — CLONAZEPAM 0.5 MG PO TABS: 0.5000 mg | ORAL_TABLET | Freq: Every evening | ORAL | Status: DC | PRN

## 2023-07-30 MED ORDER — STROKE: EARLY STAGES OF RECOVERY BOOK: Freq: Once | Status: DC

## 2023-07-30 MED ORDER — TAMSULOSIN HCL 0.4 MG PO CAPS: 0.4000 mg | ORAL_CAPSULE | Freq: Every day | ORAL | Status: DC

## 2023-07-30 MED ORDER — BUDESONIDE 3 MG PO CPEP
9.0000 mg | ORAL_CAPSULE | Freq: Every day | ORAL | Status: DC
  Filled 2023-07-30: qty 3

## 2023-07-30 MED ORDER — DULOXETINE HCL 60 MG PO CPEP: 60.0000 mg | ORAL_CAPSULE | Freq: Every day | ORAL | Status: DC

## 2023-07-30 MED ORDER — ACETAMINOPHEN 650 MG RE SUPP: 650.0000 mg | Freq: Four times a day (QID) | RECTAL | Status: DC | PRN

## 2023-07-30 MED ORDER — IOHEXOL 350 MG/ML SOLN
60.0000 mL | Freq: Once | INTRAVENOUS | Status: AC | PRN
  Administered 2023-07-30: 60 mL via INTRAVENOUS

## 2023-07-30 MED ORDER — PREGABALIN 100 MG PO CAPS
400.0000 mg | ORAL_CAPSULE | Freq: Every day | ORAL | Status: DC
  Administered 2023-07-30: 400 mg via ORAL
  Filled 2023-07-30: qty 4

## 2023-07-30 MED ORDER — APIXABAN 5 MG PO TABS
5.0000 mg | ORAL_TABLET | Freq: Two times a day (BID) | ORAL | Status: DC
  Administered 2023-07-30: 5 mg via ORAL
  Filled 2023-07-30: qty 1

## 2023-07-30 MED ORDER — TRAMADOL HCL 50 MG PO TABS: 50.0000 mg | ORAL_TABLET | Freq: Four times a day (QID) | ORAL | Status: DC | PRN

## 2023-07-30 MED ORDER — PANTOPRAZOLE SODIUM 40 MG PO TBEC: 40.0000 mg | DELAYED_RELEASE_TABLET | Freq: Every day | ORAL | Status: DC

## 2023-07-30 MED ORDER — ACETAMINOPHEN 325 MG PO TABS: 650.0000 mg | ORAL_TABLET | Freq: Four times a day (QID) | ORAL | Status: DC | PRN

## 2023-07-30 NOTE — ED Notes (Signed)
Pt ambulated to restroom with steady gait using cane 

## 2023-07-30 NOTE — ED Triage Notes (Addendum)
 Presents from home via EMS for TIA sx. Friend noted acute onset disorientation, unable to "process what I was seeing on the road, like a split screen." Pt recognized this feeling as unsafe and turned vehicle into grassy area slowly. Sx resolved pta. No focal deficits in triage.   EMS exam: 88CBG, 126/74, 20L hand, afib (eliquis, baseline)  R sided facial droop is baseline for him Denies HA, dizziness at this time

## 2023-07-30 NOTE — ED Provider Notes (Signed)
 [place] EMERGENCY DEPARTMENT AT [facility] Provider Note   CSN: [id number] Arrival date & time: 07/30/23  1008     History  Chief Complaint  Patient presents with   Altered Mental Status    resolved    [name] is a 83 y.o. male.   Altered Mental Status    Patient has a history of chest pain hyperlipidemia ulcerative proctitis paroxysmal atrial fibrillation hypertension chronic kidney disease.  Patient states last several days he has had some URI symptoms.  He had a slight cough.  He did not have much of an appetite.  He went to see his doctor the other day and tested negative for flu.  Patient states this morning he was actually feeling better.  He was driving with a friend when he had an episode of having difficulty with his vision.  Patient states it lasted for about a minute.  He was seen more than 2 objects of the things that were in front of him.  Patient recognize he was having difficulty and he was able to pull the vehicle to a grassy area after slowing down.  Patient states all his symptoms have resolved and he has not had any recurrence.  He did not have any trouble with his speech.  Did not have any trouble with his strength.  He denies any numbness.  No weakness.  Home Medications Prior to Admission medications   Medication Sig Start Date End Date Taking? Authorizing Provider  acetaminophen (TYLENOL) 325 MG tablet Take 2 tablets (650 mg total) by mouth every 6 (six) hours as needed for mild pain (or Fever >/= 101). 12/18/20   Lonia Blood, MD  carvedilol (COREG) 3.125 MG tablet Take 3.125 mg by mouth 2 (two) times daily.    [provider]  clonazePAM (KLONOPIN) 0.5 MG tablet take 1/2-1 TABLET BY MOUTH EVERYDAY AT BEDTIME AS NEEDED 02/06/22   Glean Salvo, NP  ELIQUIS 5 MG TABS tablet TAKE ONE TABLET BY MOUTH TWICE DAILY. 07/28/23   [place], Peter M, MD  fexofenadine (ALLEGRA) 60 MG tablet Take 1 tablet (60 mg total) by mouth 2 (two) times  daily. 07/29/23   Eloisa Northern, MD  Multiple Vitamins-Minerals (MULTIVITAMIN PO) Take 1 tablet by mouth daily.    [provider]  nitroGLYCERIN (NITROSTAT) 0.4 MG SL tablet Place 0.4 mg under the tongue every 5 (five) minutes as needed for chest pain.    [provider]  oxymetazoline (AFRIN) 0.05 % nasal spray Place 1 spray into both nostrils 2 (two) times daily. 07/29/23   Eloisa Northern, MD  pantoprazole (PROTONIX) 40 MG tablet Take 1 tablet (40 mg total) by mouth 2 (two) times daily before a meal. Patient taking differently: Take 40 mg by mouth 2 (two) times daily before a meal. Take 1 tablet by mouth twice a day 4d/week alternating with 1 tablet once a day 3d/week 12/18/20   Lonia Blood, MD  pregabalin (LYRICA) 100 MG capsule TAKE FOUR CAPSULES BY MOUTH AT BEDTIME. 06/05/23   Levert Feinstein, MD  rOPINIRole (REQUIP) 0.5 MG tablet TAKE ONE TABLET BY MOUTH EVERY EVENING 12/25/22   Glean Salvo, NP  rosuvastatin (CRESTOR) 10 MG tablet TAKE 1 TABLET ONCE DAILY OR AS DIRECTED. Patient taking differently: Take 10 mg by mouth every evening. 04/22/17   [place], Peter M, MD  tamsulosin (FLOMAX) 0.4 MG CAPS capsule Take 0.4 mg by mouth daily. 12/08/18   [provider]  traMADol Janean Sark) 50  MG tablet 1-2 tablets every 6 hours as needed with extra strength Tylenol. 06/16/23   Arby Barrette, MD      Allergies    Compazine [prochlorperazine edisylate], Penicillins, Welchol [colesevelam hcl], Other, Gemfibrozil, and Red yeast rice [cholestin]    Review of Systems   Review of Systems  Physical Exam Updated Vital Signs BP (!) 119/97 (BP Location: Right Arm)   Pulse 97   Temp 97.9 F (36.6 C) (Oral)   Resp 20   Wt 101.6 kg   SpO2 97%   BMI 32.14 kg/m  Physical Exam Vitals and nursing note reviewed.  Constitutional:      General: He is not in acute distress.    Appearance: He is well-developed.  HENT:     Head: Normocephalic and atraumatic.     Right Ear: External ear  normal.     Left Ear: External ear normal.  Eyes:     General: No visual field deficit or scleral icterus.       Right eye: No discharge.        Left eye: No discharge.     Conjunctiva/sclera: Conjunctivae normal.  Neck:     Trachea: No tracheal deviation.  Cardiovascular:     Rate and Rhythm: Normal rate and regular rhythm.  Pulmonary:     Effort: Pulmonary effort is normal. No respiratory distress.     Breath sounds: Normal breath sounds. No stridor. No wheezing or rales.  Abdominal:     General: Bowel sounds are normal. There is no distension.     Palpations: Abdomen is soft.     Tenderness: There is no abdominal tenderness. There is no guarding or rebound.  Musculoskeletal:        General: No tenderness.     Cervical back: Neck supple.  Skin:    General: Skin is warm and dry.     Findings: No rash.  Neurological:     Mental Status: He is alert and oriented to person, place, and time.     Cranial Nerves: No cranial nerve deficit, dysarthria or facial asymmetry.     Sensory: No sensory deficit.     Motor: No abnormal muscle tone, seizure activity or pronator drift.     Coordination: Coordination normal.     Comments:  able to hold both legs off bed for 5 seconds, sensation intact in all extremities,  no left or right sided neglect, normal finger-nose exam bilaterally, no nystagmus noted   Psychiatric:        Mood and Affect: Mood normal.     ED Results / Procedures / Treatments   Labs (all labs ordered are listed, but only abnormal results are displayed) Labs Reviewed  COMPREHENSIVE METABOLIC PANEL - Abnormal; Notable for the following components:      Result Value   Creatinine, Ser 1.46 (*)    AST 46 (*)    GFR, Estimated 47 (*)    All other components within normal limits  PROTIME-INR - Abnormal; Notable for the following components:   Prothrombin Time 15.9 (*)    All other components within normal limits  APTT - Abnormal; Notable for the following components:    aPTT 38 (*)    All other components within normal limits  ETHANOL  DIFFERENTIAL  CBC  RAPID URINE DRUG SCREEN, HOSP PERFORMED  URINALYSIS, ROUTINE W REFLEX MICROSCOPIC  CBG MONITORING, ED  I-STAT CHEM 8, ED    EKG EKG Interpretation Date/Time:  Wednesday July 30 2023 15:35:26 EST Ventricular  Rate:  71 PR Interval:    QRS Duration:  92 QT Interval:  402 QTC Calculation: 436 R Axis:   -54  Text Interpretation: Atrial fibrillation Left axis deviation Low voltage QRS Cannot rule out Anterior infarct , age undetermined Abnormal ECG When compared with ECG of 16-Jun-2023 10:41, No significant change since last tracing Confirmed by Linwood Dibbles [phone]) on 07/30/2023 3:42:45 PM  Radiology MR BRAIN WO CONTRAST Result Date: 07/30/2023 CLINICAL DATA:  Neuro deficit, acute, stroke suspected. Acute disorientation and visual disturbance, now resolved. EXAM: MRI HEAD WITHOUT CONTRAST TECHNIQUE: Multiplanar, multiecho pulse sequences of the brain and surrounding structures were obtained without intravenous contrast. COMPARISON:  Head CT 07/30/2023 and MRI 11/23/2012 FINDINGS: Brain: There is no evidence of an acute infarct, mass, midline shift, or extra-axial fluid collection. A single chronic microhemorrhage is noted in the left occipital lobe. No significant white matter disease is seen for age. There is mild generalized cerebral atrophy. Vascular: Major intracranial vascular flow voids are preserved. Skull and upper cervical spine: Unremarkable bone marrow signal. Sinuses/Orbits: Bilateral cataract extraction. Paranasal sinuses and mastoid air cells are clear. Other: None. IMPRESSION: Essentially unremarkable appearance of the brain for age. No acute intracranial abnormality. Electronically Signed   By: Sebastian Ache M.D.   On: 07/30/2023 15:48   CT HEAD WO CONTRAST Result Date: 07/30/2023 CLINICAL DATA:  Double vision and altered mental status EXAM: CT HEAD WITHOUT CONTRAST TECHNIQUE: Contiguous axial  images were obtained from the base of the skull through the vertex without intravenous contrast. RADIATION DOSE REDUCTION: This exam was performed according to the departmental dose-optimization program which includes automated exposure control, adjustment of the mA and/or kV according to patient size and/or use of iterative reconstruction technique. COMPARISON:  CT head 06/16/2023 FINDINGS: Brain: No acute intracranial hemorrhage. No CT evidence of acute infarct. No edema, mass effect, or midline shift. The basilar cisterns are patent. Ventricles: The ventricles are normal. Vascular: No hyperdense vessel or unexpected calcification. Skull: No acute or aggressive finding. Orbits: Orbits are symmetric. Sinuses: The visualized paranasal sinuses are clear. Other: Mastoid air cells are clear. IMPRESSION: No CT evidence of acute intracranial abnormality. Electronically Signed   By: Emily Filbert M.D.   On: 07/30/2023 13:00    Procedures Procedures    Medications Ordered in ED Medications - No data to display  ED Course/ Medical Decision Making/ A&P Clinical Course as of 07/30/23 1556  Wed Jul 30, 2023  1542 CT head without acute findings.  CBC metabolic panel unremarkable.  Alcohol level negative. [JK]  1555 Case discussed with Dr Lowell Guitar [JK]    Clinical Course User Index [JK] Linwood Dibbles, MD                                 Medical Decision Making Problems Addressed: Near syncope: acute illness or injury that poses a threat to life or bodily functions TIA (transient ischemic attack): acute illness or injury that poses a threat to life or bodily functions  Amount and/or Complexity of Data Reviewed Labs: ordered. Decision-making details documented in ED Course. Radiology: ordered and independent interpretation performed.   Patient presented to the ED for evaluation of an episode of changes in his vision, near syncopal type event.  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  Patient does not have  any focal neurologic deficits at this time.  He is currently back is not dehydrated.  He is in atrial fibrillation but he does not appear particularly tachycardic or bradycardic.  Presentation is concerning for the possibility of TIA.  Also concerned about the possible near syncopal episode causing this change in his vision.  His initial triage heart rate was documented at 46.  I do not have an EKG at that time.  It is possible he may be having bradycardic episodes.  Will consult medical service for admission and further workup.       Final Clinical Impression(s) / ED Diagnoses Final diagnoses:  Near syncope  TIA (transient ischemic attack)    Rx / DC Orders ED Discharge Orders     None         Linwood Dibbles, MD 07/30/23 1556

## 2023-07-30 NOTE — ED Notes (Signed)
 Hospitalist at bedside

## 2023-07-30 NOTE — ED Provider Triage Note (Signed)
 Emergency Medicine Provider Triage Evaluation Note  [name] , a 83 y.o. male  was evaluated in triage.  Pt complains of vision changes that began prior to arrival..  Patient is when he was driving when at 10 AM today he had 2 minutes of seeing "7 TV screens in his vision" that resolved.  Patient denies any history of stroke or ocular problems.  Patient's member walk since then.  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  Patient denies any facial or neck pain or paresthesias or new onset weakness in any of extremities or paresthesias.  Patient is on Eliquis for A-fib but states he has been compliant with this and denies any recent trauma to the head or neck area.  Review of Systems  Positive:  Negative:   Physical Exam  BP 120/78 (BP Location: Right Arm)   Pulse 64   Temp 98.4 F (36.9 C) (Oral)   Resp 18   Wt 101.6 kg   SpO2 96%   BMI 32.14 kg/m  Gen:   Awake, no distress , resting comfortably reading the newspaper Resp:  Normal effort  MSK:   Moves extremities without difficulty  Other:  Slight facial droop on the right side however patient states this is chronic facial droop, symmetrical smile, pupils PERRL bilaterally, visual fields intact, visual acuity intact grossly, normal finger-to-nose, able to ambulate without difficulty, no slurring of words, 5/5 bilateral grip strength  Medical Decision Making  Medically screening exam initiated at 11:04 AM.  Appropriate orders placed.  [name] was informed that the remainder of the evaluation will be completed by another provider, this initial triage assessment does not replace that evaluation, and the importance of remaining in the ED until their evaluation is complete.  Workup initiated, patient not showing any signs of LVO however concerned he may have had a TIA and so we will order labs and imaging, EMS triage nurse notified patient should  stay in the emergency department not go to the waiting room.   Netta Corrigan, PA-C 07/30/23 1106

## 2023-07-30 NOTE — ED Notes (Signed)
 Patient transported to MRI
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
## 2023-07-31 ENCOUNTER — Observation Stay (HOSPITAL_BASED_OUTPATIENT_CLINIC_OR_DEPARTMENT_OTHER): Payer: Medicare Other

## 2023-07-31 DIAGNOSIS — G459 Transient cerebral ischemic attack, unspecified: Secondary | ICD-10-CM | POA: Diagnosis not present

## 2023-07-31 DIAGNOSIS — H532 Diplopia: Secondary | ICD-10-CM | POA: Diagnosis not present

## 2023-07-31 LAB — ECHOCARDIOGRAM COMPLETE
AR max vel: 2.1 cm2
AV Area VTI: 1.9 cm2
AV Area mean vel: 1.83 cm2
AV Mean grad: 3 mmHg
AV Peak grad: 5.5 mmHg
Ao pk vel: 1.18 m/s
Area-P 1/2: 4.38 cm2
S' Lateral: 2.8 cm
Weight: 3584 [oz_av]

## 2023-07-31 LAB — COMPREHENSIVE METABOLIC PANEL
ALT: 25 U/L (ref 0–44)
AST: 34 U/L (ref 15–41)
Albumin: 3.1 g/dL — ABNORMAL LOW (ref 3.5–5.0)
Alkaline Phosphatase: 46 U/L (ref 38–126)
Anion gap: 12 (ref 5–15)
BUN: 22 mg/dL (ref 8–23)
CO2: 19 mmol/L — ABNORMAL LOW (ref 22–32)
Calcium: 7.7 mg/dL — ABNORMAL LOW (ref 8.9–10.3)
Chloride: 110 mmol/L (ref 98–111)
Creatinine, Ser: 1.34 mg/dL — ABNORMAL HIGH (ref 0.61–1.24)
GFR, Estimated: 52 mL/min — ABNORMAL LOW (ref >60)
Glucose, Bld: 87 mg/dL (ref 70–99)
Potassium: 3.4 mmol/L — ABNORMAL LOW (ref 3.5–5.1)
Sodium: 141 mmol/L (ref 135–145)
Total Bilirubin: 0.7 mg/dL (ref 0.0–1.2)
Total Protein: 5 g/dL — ABNORMAL LOW (ref 6.5–8.1)

## 2023-07-31 LAB — CBC
HCT: 41.1 % (ref 39.0–52.0)
Hemoglobin: 13.5 g/dL (ref 13.0–17.0)
MCH: 32.6 pg (ref 26.0–34.0)
MCHC: 32.8 g/dL (ref 30.0–36.0)
MCV: 99.3 fL (ref 80.0–100.0)
Platelets: 141 10*3/uL — ABNORMAL LOW (ref 150–400)
RBC: 4.14 MIL/uL — ABNORMAL LOW (ref 4.22–5.81)
RDW: 14.4 % (ref 11.5–15.5)
WBC: 6.3 10*3/uL (ref 4.0–10.5)
nRBC: 0 % (ref 0.0–0.2)

## 2023-07-31 LAB — LIPID PANEL
Cholesterol: 79 mg/dL (ref 0–200)
HDL: 30 mg/dL — ABNORMAL LOW (ref >40)
LDL Cholesterol: 31 mg/dL (ref 0–99)
Total CHOL/HDL Ratio: 2.6 ratio
Triglycerides: 90 mg/dL (ref <150)
VLDL: 18 mg/dL (ref 0–40)

## 2023-07-31 MED ORDER — TRAMADOL HCL 50 MG PO TABS: 50.0000 mg | ORAL_TABLET | Freq: Two times a day (BID) | ORAL | Status: DC | PRN

## 2023-07-31 NOTE — Discharge Summary (Addendum)
 Physician Discharge Summary  [name] ZOX:[id number] DOB: 01-17-1941 DOA: 07/30/2023  PCP: [name], MD  Admit date: 07/30/2023 Discharge date: 07/31/2023  Time spent: 47 minutes  Recommendations for Outpatient Follow-up:  Needs outpatient follow-up with his usual neurologist Dr. Debarah Crape in about a week Needs follow-up with Dr. Romana Juniper him Coreg was stopped this hospital stay  Discharge Diagnoses:  MAIN problem for hospitalization   TIA versus neurocardiogenic syncope  Please see below for itemized issues addressed in HOpsital- refer to other progress notes for clarity if needed  Discharge Condition: Improved  Diet recommendation: Heart healthy  Filed Weights   07/30/23 1016  Weight: 101.6 kg    History of present illness:  83 year old white male Hiatal hernia with prior gastric volvulus status post toupet fundoplication 12/31/2022 Dr. Derrell Lolling Prior UGIB duodenal ulcer NSAID use 2022 Persistent A-fib CHADVASC >4 on Eliquis CKD 3A HLD HTN Peripheral neuropathy with restless leg syndrome- Prior R TKA 11/21/2020 Dr. Campbell Lerner Total hip repair 04/2017 Depression Still smokes pipe-drinks liquor nightly   2/25 visit Dr. Jason Fila in [place], runny nose not feeling well-negative for flu COVID prescribed Afrin   2/27 brought to ER-"unable to process what I am seeing like a split screen" has a baseline right facial-This lasted for about a minute seeing 2 objects of things in front of him--- CT head no evidence of acute intracranial abnormality,  He reports 1 week prior to this having an issue with looking at the carpet and seeing the pattern change or shift-his description of the above events with he suddenly became hyperaware of the traffic slowed down to about 20 miles an hour and he was accompanied by a friend who urged him to pull over and the episode lasted for about 15 minutes He did not take any OTC meds-had not taken gabapentin prior to this nor had he had any alcohol  or other substances with the potential for this It is noted that he is on tramadol as well as duloxetine He had an episode about a year ago where he was having some issues with his heart rate being low and events that were slightly similar to this and his Coreg was stopped and for some reason he was placed back on Coreg 3.125  MRI brain unremarkable appearance of brain for age no acute intracranial abnormality CTA no high-grade stenosis occlusion dissection involving cervical carotid or vertebral artery, mild thoracic esophageal wall thickening with patulous thoracic esophagus, calcified subcarinal right hilar lymph node question granulomatous disease Sodium 135 potassium 4.0 BUN/creatinine 20/1.4 LFTs AST slightly elevated 46 bilirubin 1.1 LDL 31 HDL 30 WBC 8.2 hemoglobin 81.1 platelet 166 INR 1.2 A1c 5.7 urine negative talk screen negative alcohol level less than 10 His EKG showed A-fib with slow ventricular response no pauses   Patient is observed overnight noted to have some mild tachycardia but no significant pauses or drops in heart rate and nothing was sustained-he did not have any recurrence of episodes  Case was discussed with neurologist Dr. Iver Nestle who felt that patient was reasonably well protected on Eliquis twice daily as long as he was taking it ---no addition to meds needed--no benefit of chage to other agent or addition of other agent The differential remains either a small TIA versus cardiogenic/syncopal episode and I will reach out to Dr. Elvis Coil office to ensure that he is set up in the outpatient setting If he continues to have symptoms he may require ophthalmology to see him  On exam he does not  have any visual field cut, smile is symmetric, power is 5/5, finger-nose-finger is intact, shoulder shrug is intact bilaterally masseter is intact Power 5/5 upper lower extremities and knees shoulders elbows etc. sensory grossly intact Reflexes 2/3  I discussed his case with his  daughter at the bedside who is a Scientist, clinical (histocompatibility and immunogenetics) and she knows what to look out for He can return to his independent living and will need labs in about a week and discussion about meds going forward    Discharge Exam: Vitals:   07/31/23 0600 07/31/23 0700  BP: 117/79 113/85  Pulse: 80 85  Resp: 17 20  Temp:    SpO2: 100% 100%    Subj on day of d/c   Well seems back to baseline no distress Eating and drinking No complaints overnight  Discharge Instructions   Discharge Instructions     Diet - low sodium heart healthy   Complete by: As directed    Discharge instructions   Complete by: As directed    You were diagnosed an episode of transient confusion-this could have been mini stroke-the differential to this is possibly related to bradycardia from your Coreg which we would stop completely until you follow-up with cardiology and I have asked that Dr. Elvis Coil office/cardiology set you up in about a week or so for follow-up and you may require at some point an event monitor to see if you are dropping your heart rate etc. while on the Coreg-do not take the oral Coreg until you see cardiology--- as an outpatient you may require an echocardiogram and/or other workup as directed by them Do not take any over-the-counter meds without informing your physician I would be careful taking your meds take your Eliquis with food twice daily do not miss doses-discussing with neurologist, there is no changes to medications that we would make as you are protected with Eliquis and no medication is at 100% so even if this was a mini stroke it would not change what we do in terms of management at this time It would be advisable to not drive for short period of time unless you are accompanied as it is unclear what might of caused your symptoms and until you are reevaluated Would recommend labs in about 1 week in the outpatient setting with your primary physician If you have recurrent symptoms please return for  follow-up   Increase activity slowly   Complete by: As directed       Allergies as of 07/31/2023       Reactions   Compazine [prochlorperazine Edisylate] Other (See Comments)   EXTRAPYRAMIDAL MOVEMENT [Involuntary muscle movement]   Penicillins Nausea And Vomiting, Rash, Other (See Comments)      Welchol [colesevelam Hcl] Other (See Comments)   MYALGIAS [Leg aches]   Other    General anesthesia - constipation    Gemfibrozil Nausea Only   PATIENT TOLERATES   Red Yeast Rice [cholestin] Other (See Comments)   flushing        Medication List     STOP taking these medications    carvedilol 3.125 MG tablet Commonly known as: COREG   fexofenadine 60 MG tablet Commonly known as: ALLEGRA       TAKE these medications    acetaminophen 325 MG tablet Commonly known as: TYLENOL Take 2 tablets (650 mg total) by mouth every 6 (six) hours as needed for mild pain (or Fever >/= 101).   budesonide 3 MG 24 hr capsule Commonly known as: ENTOCORT EC Take 9  mg by mouth daily.   DULoxetine 60 MG capsule Commonly known as: CYMBALTA Take 60 mg by mouth daily.   Eliquis 5 MG Tabs tablet Generic drug: apixaban TAKE ONE TABLET BY MOUTH TWICE DAILY.   MULTIVITAMIN PO Take 1 tablet by mouth daily.   nitroGLYCERIN 0.4 MG SL tablet Commonly known as: NITROSTAT Place 0.4 mg under the tongue every 5 (five) minutes as needed for chest pain.   oxymetazoline 0.05 % nasal spray Commonly known as: AFRIN Place 1 spray into both nostrils 2 (two) times daily.   pantoprazole 40 MG tablet Commonly known as: PROTONIX Take 1 tablet (40 mg total) by mouth 2 (two) times daily before a meal. What changed: additional instructions   pregabalin 100 MG capsule Commonly known as: LYRICA TAKE FOUR CAPSULES BY MOUTH AT BEDTIME.   rOPINIRole 0.5 MG tablet Commonly known as: REQUIP TAKE ONE TABLET BY MOUTH EVERY EVENING   rosuvastatin 10 MG tablet Commonly known as: CRESTOR TAKE 1 TABLET ONCE  DAILY OR AS DIRECTED. What changed: See the new instructions.   tamsulosin 0.4 MG Caps capsule Commonly known as: FLOMAX Take 0.4 mg by mouth daily.   traMADol 50 MG tablet Commonly known as: ULTRAM Take 1 tablet (50 mg total) by mouth every 12 (twelve) hours as needed for moderate pain (pain score 4-6). What changed: when to take this       Allergies  Allergen Reactions   Compazine [Prochlorperazine Edisylate] Other (See Comments)    EXTRAPYRAMIDAL MOVEMENT [Involuntary muscle movement]   Penicillins Nausea And Vomiting, Rash and Other (See Comments)        Welchol [Colesevelam Hcl] Other (See Comments)    MYALGIAS [Leg aches]   Other     General anesthesia - constipation    Gemfibrozil Nausea Only    PATIENT TOLERATES   Red Yeast Rice [Cholestin] Other (See Comments)    flushing      The results of significant diagnostics from this hospitalization (including imaging, microbiology, ancillary and laboratory) are listed below for reference.    Significant Diagnostic Studies: CT Angio Neck W and/or Wo Contrast Result Date: 07/30/2023 CLINICAL DATA:  Carotid artery stenosis, vision changes. EXAM: CT ANGIOGRAPHY NECK TECHNIQUE: Multidetector CT imaging of the neck was performed using the standard protocol during bolus administration of intravenous contrast. Multiplanar CT image reconstructions and MIPs were obtained to evaluate the vascular anatomy. Carotid stenosis measurements (when applicable) are obtained utilizing NASCET criteria, using the distal internal carotid diameter as the denominator. RADIATION DOSE REDUCTION: This exam was performed according to the departmental dose-optimization program which includes automated exposure control, adjustment of the mA and/or kV according to patient size and/or use of iterative reconstruction technique. CONTRAST:  60mL OMNIPAQUE IOHEXOL 350 MG/ML SOLN COMPARISON:  Same day MRI head and CT head. FINDINGS: Aortic arch: Standard  configuration of the aortic arch. Imaged portion shows no evidence of aneurysm or dissection. Mild atherosclerosis. No significant stenosis of the major arch vessel origins. Pulmonary arteries: As permitted by contrast timing, there are no filling defects in the visualized pulmonary arteries. Subclavian arteries: The subclavian arteries are patent bilaterally. Right carotid system: No evidence of dissection, stenosis (50% or greater), or occlusion. Mild tortuosity of the cervical ICA. Left carotid system: No evidence of dissection, stenosis (50% or greater), or occlusion. Mild tortuosity of the cervical ICA. Vertebral arteries: Codominant. No evidence of dissection, stenosis (50% or greater), or occlusion. Mild tortuosity of the left V1 segment. Skeleton: No acute findings. Degenerative changes in  the cervical spine. Intervertebral disc space narrowing greatest at C5-6 and C6-7. Facet arthrosis throughout the cervical spine most pronounced on the left at C2-3 through C4-5. Other neck: The visualized airway is patent. No cervical lymphadenopathy. Upper chest: Visualized lung apices are clear. Calcified subcarinal and right hilar lymph nodes. Mildly patulous appearance of the thoracic esophagus. Mild thoracic esophageal wall thickening. Intracranial: Limited visualization of the intracranial arterial vasculature without focal abnormality. Fetal origin of the right PCA noted. Intracranial structures otherwise unremarkable. Review of the MIP images confirms the above findings IMPRESSION: 1. No high-grade stenosis, occlusion, or dissection involving the cervical carotid or vertebral arteries. 2. Mild thoracic esophageal wall thickening with mildly patulous appearance of the thoracic esophagus. Correlate for esophagitis. 3. Calcified subcarinal and right hilar lymph nodes, suggestive of prior granulomatous disease. 4. Aortic Atherosclerosis (ICD10-I.e. Aortic atherosclerosis). Electronically Signed   By: Emily Filbert  M.D.   On: 07/30/2023 18:14   MR BRAIN WO CONTRAST Result Date: 07/30/2023 CLINICAL DATA:  Neuro deficit, acute, stroke suspected. Acute disorientation and visual disturbance, now resolved. EXAM: MRI HEAD WITHOUT CONTRAST TECHNIQUE: Multiplanar, multiecho pulse sequences of the brain and surrounding structures were obtained without intravenous contrast. COMPARISON:  Head CT 07/30/2023 and MRI 11/23/2012 FINDINGS: Brain: There is no evidence of an acute infarct, mass, midline shift, or extra-axial fluid collection. A single chronic microhemorrhage is noted in the left occipital lobe. No significant white matter disease is seen for age. There is mild generalized cerebral atrophy. Vascular: Major intracranial vascular flow voids are preserved. Skull and upper cervical spine: Unremarkable bone marrow signal. Sinuses/Orbits: Bilateral cataract extraction. Paranasal sinuses and mastoid air cells are clear. Other: None. IMPRESSION: Essentially unremarkable appearance of the brain for age. No acute intracranial abnormality. Electronically Signed   By: Sebastian Ache M.D.   On: 07/30/2023 15:48   CT HEAD WO CONTRAST Result Date: 07/30/2023 CLINICAL DATA:  Double vision and altered mental status EXAM: CT HEAD WITHOUT CONTRAST TECHNIQUE: Contiguous axial images were obtained from the base of the skull through the vertex without intravenous contrast. RADIATION DOSE REDUCTION: This exam was performed according to the departmental dose-optimization program which includes automated exposure control, adjustment of the mA and/or kV according to patient size and/or use of iterative reconstruction technique. COMPARISON:  CT head 06/16/2023 FINDINGS: Brain: No acute intracranial hemorrhage. No CT evidence of acute infarct. No edema, mass effect, or midline shift. The basilar cisterns are patent. Ventricles: The ventricles are normal. Vascular: No hyperdense vessel or unexpected calcification. Skull: No acute or aggressive finding.  Orbits: Orbits are symmetric. Sinuses: The visualized paranasal sinuses are clear. Other: Mastoid air cells are clear. IMPRESSION: No CT evidence of acute intracranial abnormality. Electronically Signed   By: Emily Filbert M.D.   On: 07/30/2023 13:00    Microbiology: No results found for this or any previous visit (from the past 240 hours).   Labs: Basic Metabolic Panel: Recent Labs  Lab 07/30/23 1031 07/31/23 0431  NA 135 141  K 4.0 3.4*  CL 101 110  CO2 23 19*  GLUCOSE 91 87  BUN 20 22  CREATININE 1.46* 1.34*  CALCIUM 9.1 7.7*   Liver Function Tests: Recent Labs  Lab 07/30/23 1031 07/31/23 0431  AST 46* 34  ALT 35 25  ALKPHOS 51 46  BILITOT 1.1 0.7  PROT 6.5 5.0*  ALBUMIN 3.8 3.1*   No results for input(s): "LIPASE", "AMYLASE" in the last 168 hours. No results for input(s): "AMMONIA" in the last 168 hours.  CBC: Recent Labs  Lab 07/30/23 1103 07/31/23 0431  WBC 8.2 6.3  NEUTROABS 5.5  --   HGB 15.2 13.5  HCT 44.5 41.1  MCV 97.6 99.3  PLT 166 141*   Cardiac Enzymes: No results for input(s): "CKTOTAL", "CKMB", "CKMBINDEX", "TROPONINI" in the last 168 hours. BNP: BNP (last 3 results) No results for input(s): "BNP" in the last 8760 hours.  ProBNP (last 3 results) No results for input(s): "PROBNP" in the last 8760 hours.  CBG: No results for input(s): "GLUCAP" in the last 168 hours.  Signed:  Rhetta Mura MD   [facility] 07/31/2023, 8:06 AM

## 2023-07-31 NOTE — Progress Notes (Signed)
 Echocardiogram 2D Echocardiogram has been performed.  [name] 07/31/2023, 8:37 AM

## 2023-07-31 NOTE — ED Notes (Signed)
Admitting at beside 

## 2023-07-31 NOTE — Progress Notes (Signed)
 OT Cancellation Note  Patient Details Name: [name] MRN: [id number] DOB: Sep 25, 1940   Cancelled Treatment:    Reason Eval/Treat Not Completed: OT screened, no needs identified, will sign off  Suzanna Obey 07/31/2023, 8:18 AM 07/31/2023  RP, OTR/L  Acute Rehabilitation Services  Office:  [phone]

## 2023-07-31 NOTE — Progress Notes (Signed)
 PT Cancellation Note  Patient Details Name: [name] MRN: [id number] DOB: February 17, 1941   Cancelled Treatment:    Reason Eval/Treat Not Completed: PT screened, no needs identified, will sign off   Angelina Ok [facility] 07/31/2023, 8:30 AM Skip Mayer PT Acute Rehabilitation Services Office [phone]

## 2023-08-02 NOTE — Progress Notes (Unsigned)
 Cardiology Office Note:    Date:  08/06/2023   ID:  [name], DOB 1940/09/02, MRN [id number]  PCP:  [name], MD  Cardiologist:  Peter Swaziland, MD     Referring MD: [name], MD   Chief Complaint: hospital follow-up of TIA vs syncope  History of Present Illness:    [name] is a 83 y.o. male with a history of normal coronary arteries on cardiac catheterization in 2009, permanent atrial fibrillation on Eliquis, hypertension, hyperlipidemia, CKD stage III, GERD, GI bleed in 12/2020 secondary to multiple duodenal ulcers, and hiatal hernia with prior gastric volvulus s/p repair in 12/2022 who is followed by Dr. Swaziland and presents today for hospital follow-up of TIA vs syncope.   Patient was recently admitted from 07/30/2023 to 07/31/2023 after presenting with vision changes and confusion. He described is vision changes as being "unable to process what he was seeing like a split screen." He also described double vision. This lasted for a couple of minutes. He reported a couple of other episodes of vision changes ad hyper-awareness over the last year. One of these episodes was reportedly related to a low heart rate and his Coreg was stopped but then was subsequently restarted. EKG showed atrial fibrillation, rate 71 bpm, with no acute ischemic changes. Head CT, brain MRI, and head/ neck CTA were unremarkable. Echo showed LVEF of 55-60% with normal wall motion motion, normal RV function, severe biatrial enlargement, mild AI, and mild MR. Episodes were felt to be either a small TIA vs cardiogenic/ syncope. He reported compliance with his Eliquis. Coreg was stopped. He was advised to follow-up with Cardiology and Neurology.   Patient presents today for follow-up. We discussed the event that led him to go to the ED recently. He reported he was driving and all of a sudden had trouble focusing with "flurrying side vision" and trouble. He was having difficulty staying in the lane. His friend  was in the passenger seat who help guide the car to the side of the road. He reports he has had a couple of other episodes of difficulty focusing while playing Solitaire in the mornings. He describe one episode of this since leaving the hospital but less severe. He also reports some occasional mild lightheadedness/ dizziness or "something like that" that occurs about every other day and resolves after he takes 5 deep breaths. No near syncope/ syncope or palpitations. He describes having trouble focusing, flurrying side vision, trouble thinking and thinking while driving which led to recent admission. He states he had this before such as when doing solitary in the mornings. No chest pain or shortness of breath. No orthopnea or PND. He has some chronic lower extremity edema (worse on the right after prior ankle fracture) but this is stable.   He states his PCP previously stopped Coreg due to orthostatic symptoms. This subsequently got restarted but was stopped again during recent admission. Orthostatic vital signs are negative today.   EKGs/Labs/Other Studies Reviewed:    The following studies were reviewed:  Echocardiogram 07/31/2023: Impressions:  1. Left ventricular ejection fraction, by estimation, is 55 to 60%. The  left ventricle has normal function. The left ventricle has no regional  wall motion abnormalities. Left ventricular diastolic parameters are  indeterminate.   2. Right ventricular systolic function is normal. The right ventricular  size is mildly enlarged. There is mildly elevated pulmonary artery  systolic pressure.   3. Left atrial size was severely dilated.   4. Right  atrial size was severely dilated.   5. The mitral valve is normal in structure. Mild mitral valve  regurgitation. No evidence of mitral stenosis.   6. The aortic valve is tricuspid. Aortic valve regurgitation is mild. No  aortic stenosis is present.   EKG:  EKG not ordered today.   Recent Labs: 02/24/2023: TSH  1.050 07/31/2023: ALT 25; Hemoglobin 13.5; Platelets 141 08/06/2023: BUN 23; Creatinine, Ser 1.41; Magnesium 2.3; Potassium 4.5; Sodium 140  Recent Lipid Panel    Component Value Date/Time   CHOL 79 07/31/2023 0431   CHOL 90 (L) 01/13/2017 0846   CHOL 146 11/10/2014 0943   TRIG 90 07/31/2023 0431   TRIG 175 (H) 11/10/2014 0943   HDL 30 (L) 07/31/2023 0431   HDL 29 (L) 01/13/2017 0846   HDL 27 (L) 11/10/2014 0943   CHOLHDL 2.6 07/31/2023 0431   VLDL 18 07/31/2023 0431   LDLCALC 31 07/31/2023 0431   LDLCALC 33 01/13/2017 0846   LDLCALC 84 11/10/2014 0943    Physical Exam:    Vital Signs: BP 116/78   Pulse 75   Ht 5' 10.5" (1.791 m)   Wt 226 lb 9.6 oz (102.8 kg)   BMI 32.05 kg/m     Wt Readings from Last 3 Encounters:  08/06/23 226 lb 9.6 oz (102.8 kg)  07/30/23 224 lb (101.6 kg)  07/29/23 224 lb (101.6 kg)     General: 83 y.o. Caucasian male in no acute distress. HEENT: Normocephalic and atraumatic. Sclera clear.  Neck: Supple. No JVD. Heart: Irregularly irregular rhythm with normal rate.  No murmurs, gallops, or rubs.  Lungs: No increased work of breathing. Clear to ausculation bilaterally. No wheezes, rhonchi, or rales.  Extremities: Mild lower extremity edema bilaterally (right chronically larger than left).  Skin: Warm and dry. Neuro: No focal deficits. Psych: Normal affect. Responds appropriately.  Assessment:    1. Vision changes   2. Dizziness   3. Permanent atrial fibrillation (HCC)   4. Aortic valve insufficiency, etiology of cardiac valve disease unspecified   5. Mitral valve insufficiency, unspecified etiology   6. Primary hypertension   7. Hyperlipidemia, unspecified hyperlipidemia type   8. Stage 3a chronic kidney disease (HCC)   9. Hypokalemia     Plan:    Vision Changes Dizziness Patient was recently admitted at the end of 07/2023 for vision changes. Please see description of this above. Work-up including head/ brain imaging and Echo were  unremarkable. It was felt that this was likely due to a small TIA or cardiogenic/ syncope. Coreg was stopped as he described some similar symptoms in the past when heart rate got low.  - He described one episode of trouble focusing since recent admission as well as brief episodes of mild lightheadedness/ dizziness.  - Orthostatic vital signs negative in the office today.  - I am not sure that these episodes of vision changes are necessarily cardiac in nature. However, will order 2 week Zio monitor for further evaluation of any significant brady or tachy arrhythmias.  - Will defer driving restrictions to PCP.  - Follow-up with Neurology as recommended at recent discharge.   Permanent Atrial Fibrillation Rate controlled. - Coreg stopped during recent admission due to concern that this was the cause of her vision changes.  - Continue Eliquis 5mg  twice daily.  Mild Aortic Insufficiency Mild Mitral Regurgitation Noted on recent Echo in 07/2023.  - Can continue routine monitoring.  Hypertension BP well controlled.  - Not currently on any antihypertensives. Previously  on Coreg but this was recently stopped as above.   Hyperlipidemia Recent lipid panel in 07/2023: Total Cholesterol 79, Triglycerides 90, HDL 30, LDL 31.  - Continue Crestor 10mg  daily.   CKD Stage IIIa Baseline creatinine around 1.3 to 1.5.  - Stable at 1.34 on 07/31/2023.   Hypokalemia Potassium 3.4 on 07/31/2023.  - Will repeat BMET today. Will also check a Magnesium level given reports of some cramping in his hands.   Disposition: Follow up in 3 months.    Signed, Corrin Parker, PA-C  08/06/2023 9:25 PM    [facility]

## 2023-08-05 DIAGNOSIS — R001 Bradycardia, unspecified: Secondary | ICD-10-CM | POA: Diagnosis not present

## 2023-08-05 DIAGNOSIS — R404 Transient alteration of awareness: Secondary | ICD-10-CM | POA: Diagnosis not present

## 2023-08-05 DIAGNOSIS — I4819 Other persistent atrial fibrillation: Secondary | ICD-10-CM | POA: Diagnosis not present

## 2023-08-06 ENCOUNTER — Other Ambulatory Visit: Payer: Self-pay | Admitting: Student

## 2023-08-06 ENCOUNTER — Ambulatory Visit: Payer: Medicare Other | Attending: Student | Admitting: Student

## 2023-08-06 ENCOUNTER — Ambulatory Visit

## 2023-08-06 ENCOUNTER — Encounter: Payer: Self-pay | Admitting: Student

## 2023-08-06 ENCOUNTER — Ambulatory Visit (INDEPENDENT_AMBULATORY_CARE_PROVIDER_SITE_OTHER)

## 2023-08-06 VITALS — BP 116/78 | HR 75 | Ht 70.5 in | Wt 226.6 lb

## 2023-08-06 DIAGNOSIS — I340 Nonrheumatic mitral (valve) insufficiency: Secondary | ICD-10-CM

## 2023-08-06 DIAGNOSIS — E785 Hyperlipidemia, unspecified: Secondary | ICD-10-CM

## 2023-08-06 DIAGNOSIS — I10 Essential (primary) hypertension: Secondary | ICD-10-CM | POA: Diagnosis not present

## 2023-08-06 DIAGNOSIS — I351 Nonrheumatic aortic (valve) insufficiency: Secondary | ICD-10-CM | POA: Diagnosis not present

## 2023-08-06 DIAGNOSIS — E876 Hypokalemia: Secondary | ICD-10-CM

## 2023-08-06 DIAGNOSIS — H539 Unspecified visual disturbance: Secondary | ICD-10-CM | POA: Diagnosis not present

## 2023-08-06 DIAGNOSIS — I4821 Permanent atrial fibrillation: Secondary | ICD-10-CM

## 2023-08-06 DIAGNOSIS — R42 Dizziness and giddiness: Secondary | ICD-10-CM

## 2023-08-06 DIAGNOSIS — N1831 Chronic kidney disease, stage 3a: Secondary | ICD-10-CM | POA: Diagnosis not present

## 2023-08-06 LAB — BASIC METABOLIC PANEL
BUN/Creatinine Ratio: 16 (ref 10–24)
BUN: 23 mg/dL (ref 8–27)
CO2: 21 mmol/L (ref 20–29)
Calcium: 9.1 mg/dL (ref 8.6–10.2)
Chloride: 104 mmol/L (ref 96–106)
Creatinine, Ser: 1.41 mg/dL — ABNORMAL HIGH (ref 0.76–1.27)
Glucose: 95 mg/dL (ref 70–99)
Potassium: 4.5 mmol/L (ref 3.5–5.2)
Sodium: 140 mmol/L (ref 134–144)
eGFR: 49 mL/min/{1.73_m2} — ABNORMAL LOW (ref >59)

## 2023-08-06 LAB — MAGNESIUM: Magnesium: 2.3 mg/dL (ref 1.6–2.3)

## 2023-08-06 NOTE — Patient Instructions (Signed)
 Medication Instructions:  Continue same medications *If you need a refill on your cardiac medications before your next appointment, please call your pharmacy*   Lab Work: Bmet,magnesium today   Testing/Procedures: 2 week Zio heart monitor  will be mailed to your home with instructions   Follow-Up: At [facility], you and your health needs are our priority.  As part of our continuing mission to provide you with exceptional heart care, we have created designated Provider Care Teams.  These Care Teams include your primary Cardiologist (physician) and Advanced Practice Providers (APPs -  Physician Assistants and Nurse Practitioners) who all work together to provide you with the care you need, when you need it.  We recommend signing up for the patient portal called "MyChart".  Sign up information is provided on this After Visit Summary.  MyChart is used to connect with patients for Virtual Visits (Telemedicine).  Patients are able to view lab/test results, encounter notes, upcoming appointments, etc.  Non-urgent messages can be sent to your provider as well.   To learn more about what you can do with MyChart, go to [email or web address].    Your next appointment:  3 months    Provider:  Marjie Skiff PA

## 2023-08-06 NOTE — Progress Notes (Unsigned)
 Enrolled patient for a 14 day Zio XT  monitor to be mailed to patients home

## 2023-08-06 NOTE — Progress Notes (Unsigned)
Enrolled for [employer] to mail a ZIO XT long term holter monitor to the patients address on file.   Dr. Jordan to read. 

## 2023-08-09 DIAGNOSIS — R42 Dizziness and giddiness: Secondary | ICD-10-CM | POA: Diagnosis not present

## 2023-08-09 DIAGNOSIS — E876 Hypokalemia: Secondary | ICD-10-CM

## 2023-08-09 DIAGNOSIS — I4821 Permanent atrial fibrillation: Secondary | ICD-10-CM

## 2023-08-22 ENCOUNTER — Other Ambulatory Visit: Payer: Self-pay | Admitting: Neurology

## 2023-08-22 ENCOUNTER — Encounter: Payer: Self-pay | Admitting: Neurology

## 2023-08-25 MED ORDER — CLONAZEPAM 0.5 MG PO TABS: 0.5000 mg | ORAL_TABLET | Freq: Every evening | ORAL | 0 refills | Status: AC | PRN

## 2023-08-25 NOTE — Telephone Encounter (Signed)
 e  PMP               07/16/2023 06/05/2023  4 Pregabalin 100 Mg Capsule 120.00 30 Roney Jaffe [id number] Gat (0700) 1/1 2.68 LME Medicare [place]  06/16/2023 06/16/2023  4 Tramadol Hcl 50 Mg Tablet 15.00 2 Ma Pfe [id number] Gat (0700) 0/0 75.00 MME Medicare [place]  06/05/2023 06/05/2023  4 Pregabalin 100 Mg Capsule 120.00 30 Roney Jaffe [id number] Gat (0700) 0/1 2.68 LME Medicare [place]  04/30/2023 03/18/2023  4 Pregabalin 100 Mg Capsule 120.00 30 Roney Jaffe [phone] Gat (0700) 1/1 2.68 LME Medicare [place]  03/18/2023 03/18/2023  4 Pregabalin 100 Mg Capsule 120.00 30 Roney Jaffe [phone] Gat (0700) 0/1 2.68 LME Medicare [place]  01/28/2023 09/02/2022  4 Pregabalin 100 Mg Capsule 120.00 30 Sa Sla [id number] Gat (0700) 0/1 2.68 LME Medicare [place]  01/03/2023 01/02/2023  3 Tramadol Hcl 50 Mg Tablet 20.00 5 Jr Ram [id number] Pio [phone] 0/0 40.00 MME Medicare [place]  12/25/2022 09/02/2022  3 Pregabalin 100 Mg Capsule 120.00 30 Sa Sla [phone] Pio (6934) 3/5 2.68 LME Medicare [place]  11/25/2022 09/02/2022  2 Pregabalin 100 Mg Capsule 120.00 30 Sa Sla [id number] Pio (6934) 2/5 2.68 LME Medicare [place]  11/25/2022 11/24/2022  2 Hydrocodone-Acetamin 5-325 Mg 12.00 3 Marijo File [phone] Pio (6934) 0/0 20.00 MME Medicare [place]  10/24/2022 09/02/2022  2 Pregabalin 100 Mg Capsule 120.00 30 Sa Sla [phone] Pio (6934) 1/5 2.68 LME Medicare [place]  09/25/2022 09/02/2022  2 Pregabalin 100 Mg Capsule 120.00 30 Sa Sla [id number] Pio (6934) 0/5 2.68 LME Medicare [place]  08/13/2022 03/12/2022  2 Pregabalin 100 Mg Capsule 120.00 30 Roney Jaffe [id number] Pio (6934) 5/5 2.68 LME Medicare [place]  07/15/2022 03/12/2022  2 Pregabalin 100 Mg Capsule 120.00 30 Roney Jaffe [id number] Pio (6934) 4/5 2.68 LME Medicare [place]  06/17/2022 03/12/2022  2 Pregabalin 100 Mg Capsule 120.00 30 Roney Jaffe [phone] Pio (6934) 3/5 2.68 LME Medicare [place]  05/16/2022 03/12/2022  2 Pregabalin 100 Mg Capsule 120.00 30 Roney Jaffe [phone] Pio (6934) 2/5 2.68 LME Medicare [place]  04/17/2022 03/12/2022  1 Pregabalin 100 Mg Capsule 120.00 30 Roney Jaffe [id number] Pio (6934) 1/5 2.68 LME  Medicare [place]  03/18/2022 03/12/2022  1 Pregabalin 100 Mg Capsule 120.00 30 Roney Jaffe [phone] Pio (6934) 0/5 2.68 LME Medicare [place]  02/14/2022 02/06/2022  1 Clonazepam 0.5 Mg Tablet 20.00 20 Sa Sla [id number] Pio (6934) 0/0 1.00 LME Medicare   02/14/2022 12/18/2021  1 Pregabalin 100 Mg Capsule 120.00 30 Sa Sla [phone] Pio (6934) 2/5 2.68 LME Medicare     Meds ordered this encounter  Medications   clonazePAM (KLONOPIN) 0.5 MG tablet    Sig: Take 1 tablet (0.5 mg total) by mouth at bedtime as needed for anxiety.    Dispense:  30 tablet    Refill:  0

## 2023-08-25 NOTE — Telephone Encounter (Signed)
 Last seen 02/24/23, next appt 09/10/23  Dispenses   Dispensed Days Supply Quantity Provider Pharmacy  pregabalin 100 mg capsule 07/16/2023 30 120 each Levert Feinstein, MD [facility]...  pregabalin 100 mg capsule 06/05/2023 30 120 each Levert Feinstein, MD [facility]...  pregabalin 100 mg capsule 04/30/2023 30 120 each Levert Feinstein, MD [facility] - [facility]...  pregabalin 100 mg capsule 03/18/2023 30 120 each Levert Feinstein, MD [facility] - [facility]...  pregabalin 100 mg capsule 01/28/2023 30 120 each Glean Salvo, NP [facility] - [facility]...  PREGABALIN  100 MG CAPS 01/07/2023 12 60 capsule Hal Hope, FNP [facility]...  pregabalin 100 mg capsule 12/25/2022 30 120 each Glean Salvo, NP [facility]...  pregabalin 100 mg capsule 11/25/2022 30 120 each Glean Salvo, NP [facility]...  pregabalin 100 mg capsule 10/24/2022 30 120 each Glean Salvo, NP [facility]...  pregabalin 100 mg capsule 09/25/2022 30 120 each Glean Salvo, NP [facility].Marland KitchenMarland Kitchen

## 2023-08-26 DIAGNOSIS — H01001 Unspecified blepharitis right upper eyelid: Secondary | ICD-10-CM | POA: Diagnosis not present

## 2023-08-26 DIAGNOSIS — H10501 Unspecified blepharoconjunctivitis, right eye: Secondary | ICD-10-CM | POA: Diagnosis not present

## 2023-08-27 DIAGNOSIS — E876 Hypokalemia: Secondary | ICD-10-CM | POA: Diagnosis not present

## 2023-08-27 DIAGNOSIS — R42 Dizziness and giddiness: Secondary | ICD-10-CM | POA: Diagnosis not present

## 2023-08-28 DIAGNOSIS — L821 Other seborrheic keratosis: Secondary | ICD-10-CM | POA: Diagnosis not present

## 2023-08-28 DIAGNOSIS — L570 Actinic keratosis: Secondary | ICD-10-CM | POA: Diagnosis not present

## 2023-08-28 DIAGNOSIS — R202 Paresthesia of skin: Secondary | ICD-10-CM | POA: Diagnosis not present

## 2023-08-28 DIAGNOSIS — L814 Other melanin hyperpigmentation: Secondary | ICD-10-CM | POA: Diagnosis not present

## 2023-08-28 DIAGNOSIS — D225 Melanocytic nevi of trunk: Secondary | ICD-10-CM | POA: Diagnosis not present

## 2023-09-03 NOTE — Telephone Encounter (Signed)
 Forwarded monitor results to PCP via Epic.

## 2023-09-09 NOTE — Progress Notes (Unsigned)
 ASSESSMENT AND PLAN 83 y.o. year old male   1.  Peripheral neuropathy 2.  Restless leg syndrome 3.  Anemia-on iron supplement - Overall under good control - Continue lower dose Lyrica 300 mg at bedtime, Cymbalta 60 mg daily, Requip 0.5 mg at bedtime, clonazepam as needed - Check ferritin level  4.  Episode of vision change February 2025 (TIA versus near syncopal episode due to bradycardia). MRI brain unremarkable. A1C 5.7, LDL 31 - History of chronic A-fib on Eliquis, 5 mg twice a day with good compliance - Coreg was discontinued - Referral to ophthalmology with report of vision change, continues blurring to right periphery  - Continue follow-up with PCP for management of vascular risk factors Strict management of vascular risk factors with a goal BP less than 130/90, A1c less than 7.0, LDL less than 70 for secondary stroke prevention -Follow up in 6-8 months   IMPRESSION: 1. No high-grade stenosis, occlusion, or dissection involving the cervical carotid or vertebral arteries. 2. Mild thoracic esophageal wall thickening with mildly patulous appearance of the thoracic esophagus. Correlate for esophagitis. 3. Calcified subcarinal and right hilar lymph nodes, suggestive of prior granulomatous disease. 4. Aortic Atherosclerosis (ICD10-I.e. Aortic atherosclerosis).  DIAGNOSTIC DATA (LABS, IMAGING, TESTING) - I reviewed patient records, labs, notes, testing and imaging myself where available.    HISTORY  [name] is a 83 yo RH male, accompanied by his wife, referred by Dr. Modesto Charon, and his primary care physician Dr. Uvaldo Rising for evaluation of chronic low back pain, bilateral lower extremity muscle ache  He had a past medical history of hypertension, hyperlipidemia, presenting with chronic low back pain, bilateral lower extremity muscle achy pain since 2010.   Initially it was thought due to his statin treatment, he has tried different statin without improving his symptoms,over the  years, he also received multiple lumbar bilateral facet joint fluoroscopy guided injection without improving his symptoms.   In 2012, he was diagnosed with L4 and 5 lumbar stenosis, had lumbar laminectomy L3-4, L4-L5 by Dr. Jeral Fruit in July 2012,without improving his symptoms,  He was seen by different specialists, tried different medications, this including Elavil, Xanax as needed, he is currently taking hydrocodone 5/325 mg 1-2 tablets every night, Xanax 0.25 milligram as needed, Flexeril 10 mg every night, gabapentin 600 mg every night without significant improvement,  During the daytime, he denies significant low back pain, or bilateral lower extremity pain, he does has history of right ankle fracture, mild gait difficulty due to right ankle pain, but starting at evening time, he began to noticed deep achy pain starting from midline low back, radiating along bilateral lateral leg, constant, he tends to pace around, has difficulty sleeping at nighttime, sometimes he has to got up take a hot bath couple times every night, put ice on his back, on his feet, only provide temporary relief.  He has also tried acupuncture, massage, TENS unit, without significant improvement, previously, he has a short trial of low-dose Requip, without improving his symptoms, no significant side effect noticed either.  He has bilateral lateral 3 toes numbness, no weakness, no bilateral fingertips numbness, or weakness  Per record, electrodiagnostic study failed to demonstrate significant etiology,  We have reviewed MRI lumbar in 2012, there was evidence of L4-5 spinal stenosis, and the most recent MRI  Lumbar was  in June 2014,progression of multilevel facet arthropathy, status post a laminectomy at L3-4, with residual large synovial cyst extending along the posterior epidural space without significant central canal  stenosis, mild foraminal narrowing bilaterally at L3 and 4, progression of mild lateral recess narrowing bilaterally  at L4-5, foraminal narrowing is stable at this level  MRI of the brain in June 2014 showed mild atrophy, no acute intracranial abnormality   UPDATE Dec 15th 2015:   He is tolerating gabapentin 300 mg 2 tablets every night, Requip 1 mg 2 tablets every night, he can sleep better with the medications, but continue complains of bilateral lower extremity dull achy pain, numbness, urgency to move, especially at nighttime, He also complains of slow progressive gait difficulty, grasps things for support, he has urinary urgency, but no incontinence.   UPDATE Jan 29th 2016: He is now taking gabapentin 300mg   2 tabs qhs and requip 1mg , 3 tabs qhs, which has been very helpful,  30%, improvement, he still has bilatarel leg pain at evening, stretching,massage,  He denies bowel and bladder incontinence , he has tinlging at the soles of his feet.    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   He continue complaining significant difficulty at nighttime, came in with a full page list of symptoms, in the morning time, he denies significant back pain, no low back pain, symptoms started in the late afternoon, gradually building up, around 9: to10 PM, he noticed achy feeling down lateral side of both leg, increased by sitting still, intense urge to lie on the floor, stretching muscle backwards, late night hot soaking bath to help him sleep, he has to take about 3 AM last night, difficulty sleeping, excessive fatigue during the daytime, could not sit through movies, standing up in the aisle 2/3 of the movie He is now taking, hydrocodone  5/325mg  2 tabs 9pm, advil 200mg  2 tab    He has quit taking Requip 1 mg, up to  4 tablets every night, also gabapentin, complains of upset stomach, without relieving his symptoms, he is now taking Advil 200 mg 2 tablets as needed, hydrocodone/Tylenol 5/325 mg 2 tablets every night for symptoms control, which only mild, temporarily, Tylenol helps some too.   UPDATE April 29th 2016: Laboratory showed low iron, ferritin level at 9, he was put on iron supplement, his restless leg symptoms has much improved, He is now taking clonazepam 0.5 milligram every night, Lyrica 50 mg every night, he can sleep much better,   He continue, combat right ankle pain   UPDATE August 2nd 2016: He sleeps well, he is now taking clonazepam 0.5mg  qhs, Lyrica 50mg  3 tabs qhs, no longer on  Hydrocodone, he complains of side effect   UPDATE August 06 2016: I reviewed operation record on April 18 2016, he had right lumbar L3-4, L4-5 anterolateral lumbar interbody fusion, percutaneous pedicle screw on April 18 2016, which has helped his left-sided low back pain radiating pain to left lower extremity, 4. Of time he was almost pain-free, unfortunately he fell landed on his right side, is then he began to experience excruciating radiating pain from left side to left anterior thigh.   We have personally reviewed and compared to MRI lumbar in October 2017, and in February 2018. Which showed pedicle screw and interbody fusion at L3-4, L4-5 without stenosis, small fluid collection posterior to the thecal  sac at L3-4, moderate right foraminal narrowing, mild foraminal narrowing at L4-5.   He is planning on to have second left-sided lumbar decompression surgery on August 15 2016   His restless leg symptoms is under good control with current dose of Lyrica 100 mg twice a day, clonazepam as needed, oxycodone 5 mg every night   His most recent ferritin level was 47, hemoglobin was 11 point 5,   UPDATE August 07 2017: He is accompanied by his wife at today's clinical visit, complaining worsening restless leg  symptoms, for a while, his restless leg symptoms seems to be under okay control.   Since 2017, he had multiple surgery, lumbar fusion by Dr. Jennye Moccasin November 2017, redo in March 2018, left hip replacement in November 2018,   He has baseline gait abnormality, but no longer have significant pain, at nighttime, 1-2 hours after lying down, he has uncontrollable right leg muscle jerking, urged to move,   He is now taking Lyrica 100 mg 2 tablets at nighttime, clonazepam 0.5 mg every night, often have to take 1-1/2 tablets of Percocet 5/325 mg early morning time if he could not sleep his combination medication treatment.  Sometimes he has to take hot bath in the middle of the night to help him go to sleep.   UPDATE Sept 9 2019: Now he is taking generic Lyric 100mg  3 tabs qhs, Requip 0.5 mg every night,  20% of time he is able to sleep without any difficulties, but 80% of the time about 1 to 2 hours after he goes to bed, he has severe leg discomfort, has to take a hot bath, then was able to sleep the rest of the night, sometimes he took clonazepam 0.5 mg half to 1 tablets before bedtime, complains of excessive drowsiness even to the point of wetting his bed,     His ferritin level is low 15 ng/ml March 2019, last colonoscopy was in 2017, hemoglobin in November 2018 prior to his left hip surgery was 13, he is now on iron supplement   UPDATE August 10 2018: Ferritin in Sept 2019 was 49.  He continues to have restless leg symptoms, halftime in a week, he has trouble sleeping due to restless leg symptoms, to get up to take a hot shower, which usually is very helpful, is taking Lyrica 100 in the morning, 200 mg at 2 hours prior to sleep, also take iron supplement,   UPDATE August 11 2019: He has lost few dear family member over short period of time, is feeling sad.  Around February 2021, he felt like his restless leg symptoms is under good control, decided to taper down Lyrica doses, instead of 100 mg 3 tablets  every night, he tapered down to 100 mg every night, gradually increased difficulty sleeping, difficult to find a comfortable position for his leg, take hot bath, clonazepam 0.5 mg as needed was helpful last night, he also takes Cymbalta 60 mg daily, Requip 0.5 mg at bedtime   Laboratory evaluations in December 2020, normal CBC hemoglobin 14.3, CMP, creatinine of 1.2, ferritin was 15 August 2017, after iron supplement, improved to 49   CT abdomen in December 2020 showed no acute abnormality, large paraesophageal hiatal hernia, with approximately one half of the stomach present in the chest, diffuse colonic diverticulosis, without evidence of acute diverticulitis, mild diffuse hepatic steatosis, marked prostate gland enlargement, with enhancing nodule arising from the superior portion of the median lobe of the prostate gland.   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 based on symptoms alone without looking at his skin lesion,   He denies a previous history of shingles, had a Shingrix vaccination end of 2020, did reported history of left upper thoracic rash, itching few months ago   His restless leg symptoms is under good control with current regimen, Lyrica 100 mg 3 tablets every night, Requip 0.5 mg every night, Cymbalta 60 mg every morning, clonazepam 0.5 mg as needed   He has history of chronic low back pain, more to left side, had a history of lumbar decompression surgery in the past, left hip replacement in 2018, did have left hip injury within a week of replacement surgery, pain has  gradually improved, also had a history of right ankle surgery, had gait abnormality.   UPDATE Sept 12 2022: He lost his wife in March 2022, going through grieving, had right knee replacement in June 2021, during the rehab, he was found to have dark stool, epigastric discomfort, using NSAIDs for right knee pain, also taking Eliquis for atrial fibrillation  Reviewed hospital record CT showed duodenitis, had active GI bleeding due to duodenal ulcer, hemoglobin of 8.9 required blood transfusion,   He is planning on moving to independent living, current medicine works well for his restless leg   He also complains of worsening bilateral feet numbness tingling   Update April 25, 2021: His restless leg symptoms overall is under good control with his current medications, Lyrica 100 mg 3 tablets every night, clonazepam as needed, he rarely uses it, Cymbalta 60 mg every day,  He return for electrodiagnostic study today, which did show evidence of moderate axonal peripheral neuropathy, in addition there is evidence of chronic bilateral lumbosacral radiculopathy.  Reviewed laboratory evaluation in July 2022, when he was admitted to the hospital for GI bleeding, hemoglobin was 8.9, normal TSH, BMP, with creatinine of 1.27 calcium was 8.2, GFR of 56  UPDATE Sept 23: He is overall doing very well, ambulate without assistant, still have intermittent difficulty sleeping due to lower extremity paresthesia, restless leg syndrome, taking Lyrica 100 mg in the morning/400 mg at nighttime Requip 0.5 mg every night, only clonazepam rarely  He participated in his assisted living water exercise regularly, have some left hip issues, had a history of left hip replacement,  Update 09/10/23 SS: [name] to the ER 07/30/2023 for vision change while driving like a split screen (for 5 minutes), his companion took the wheel.  No other deficits.  TIA versus near syncopal event.  Was in A-fib at baseline.  At ER heart rate 46, having  bradycardic episodes?  MRI brain unremarkable, CTA no LVO.  Reviewed with neurology, felt well protected on Eliquis twice daily.  Coreg was stopped (similar symptoms in past when HR was low). Outpatient cardiac monitor showed AFIB, average HR 93, symptoms of lightheadedness with higher HR. For RLS, he reduced Lyrica 100 mg from 4 to 3 capsules daily due to feeling brain fog. On Requip 0.5 mg at bedtime, Cymbalta 60 mg daily, rarely uses Klonopin. RLS under good control. Would like iron checked Sept 2024 ferritin 53, A1C 5.8, TSH  1.050.   PHYSICAL EXAM  Vitals:   02/24/23 1242  BP: 125/85  Pulse: (!) 46  Weight: 229 lb 8 oz (104.1 kg)  Height: 5\' 10"  (1.778 m)   Body mass index is 32.93 kg/m.  PHYSICAL EXAMNIATION:  Physical Exam  General: The patient is alert and cooperative at the time of the examination.  Skin: No significant peripheral edema is noted.  Neurologic Exam  Mental status: The patient is alert and oriented x 3 at the time of the examination. The patient has apparent normal recent and remote memory, with an apparently normal attention span and concentration ability.  Cranial nerves: Facial symmetry is present. Speech is normal, no aphasia or dysarthria is noted. Extraocular movements are full. Visual fields are full.  Motor: The patient has good strength in all 4 extremities, except 4/5 left hip flexion   Sensory examination: Soft touch sensation is symmetric on the face, arms, and legs.  Coordination: The patient has good finger-nose-finger and heel-to-shin bilaterally.  Gait and station: The patient has a normal gait, has cane if needed   Reflexes: Deep tendon reflexes are symmetric but decreased.     REVIEW OF SYSTEMS: Out of a complete 14 system review of symptoms, the patient complains only of the following symptoms, and all other reviewed systems are negative.  See HPI  ALLERGIES: Allergies  Allergen Reactions   Compazine [Prochlorperazine Edisylate]  Other (See Comments)    EXTRAPYRAMIDAL MOVEMENT [Involuntary muscle movement]   Penicillins Nausea And Vomiting, Rash and Other (See Comments)        Welchol [Colesevelam Hcl] Other (See Comments)    MYALGIAS [Leg aches]   Other     General anesthesia - constipation    Gemfibrozil Nausea Only    PATIENT TOLERATES   Red Yeast Rice [Cholestin] Other (See Comments)    flushing    HOME MEDICATIONS: Outpatient Medications Prior to Visit  Medication Sig Dispense Refill   budesonide (ENTOCORT EC) 3 MG 24 hr capsule Take 9 mg by mouth daily.     clonazePAM (KLONOPIN) 0.5 MG tablet Take 1 tablet (0.5 mg total) by mouth at bedtime as needed for anxiety. 30 tablet 0   DULoxetine (CYMBALTA) 60 MG capsule Take 60 mg by mouth daily.     ELIQUIS 5 MG TABS tablet TAKE ONE TABLET BY MOUTH TWICE DAILY. 60 tablet 5   Ferrous Gluconate (IRON) 240 (27 Fe) MG TABS Take by mouth.     Multiple Vitamins-Minerals (MULTIVITAMIN PO) Take 1 tablet by mouth daily.     nitroGLYCERIN (NITROSTAT) 0.4 MG SL tablet Place 0.4 mg under the tongue every 5 (five) minutes as needed for chest pain.     pantoprazole (PROTONIX) 40 MG tablet Take 1 tablet (40 mg total) by mouth 2 (two) times daily before a meal. (Patient taking differently: Take 40 mg by mouth 2 (two) times daily before a meal. Take 1 tablet by mouth twice a day 4d/week alternating with 1 tablet once a day 3d/week) 60 tablet 1   pregabalin (LYRICA) 100 MG capsule TAKE FOUR CAPSULES BY MOUTH AT BEDTIME. 120 capsule 5   rOPINIRole (REQUIP) 0.5 MG tablet TAKE ONE TABLET BY MOUTH EVERY EVENING 90 tablet 3   rosuvastatin (CRESTOR) 10 MG tablet TAKE 1 TABLET ONCE DAILY OR AS DIRECTED. (Patient taking differently: Take 10 mg by mouth every evening.) 90 tablet 2   tamsulosin (FLOMAX) 0.4 MG CAPS capsule Take 0.4 mg by mouth daily.     acetaminophen (TYLENOL) 325  MG tablet Take 2 tablets (650 mg total) by mouth every 6 (six) hours as needed for mild pain (or Fever >/=  101).     oxymetazoline (AFRIN) 0.05 % nasal spray Place 1 spray into both nostrils 2 (two) times daily. (Patient not taking: Reported on 07/30/2023) 30 mL 0   traMADol (ULTRAM) 50 MG tablet Take 1 tablet (50 mg total) by mouth every 12 (twelve) hours as needed for moderate pain (pain score 4-6).     No facility-administered medications prior to visit.    PAST MEDICAL HISTORY: Past Medical History:  Diagnosis Date   Anginal pain (HCC)    NONE IN 3 YEARS   Arthritis    Atrial fibrillation (HCC) 1962   Chest pain    a. 1998 Cath: nl cors;  b. 2009 Cath: nl cors.   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 PETER [place]    Esophageal reflux    Essential hypertension    History of hiatal hernia    History of Paroxysmal atrial flutter (HCC)    Hyperlipidemia    Incontinence of urine    Leg pain, bilateral    Nocturia    PONV (postoperative nausea and vomiting)    CONSTIPATED   Scarlet fever 1945   Spinal stenosis    Staph skin infection    LEFT GROIN  04/11/16  TX W/ DOXYCYCLINE   Ulcerative proctitis (HCC)     PAST SURGICAL HISTORY: Past Surgical History:  Procedure Laterality Date   ANKLE RECONSTRUCTION Right 2007   [place]   ANTERIOR LAT LUMBAR FUSION Right 04/18/2016   Procedure: RIGHT LUMBAR THREE-FOUR, LUMBAR FOUR-FIVE ANTEROLATERAL LUMBAR INTERBODY FUSION;  Surgeon: Maeola Harman, MD;  Location: [facility] OR;  Service: Neurosurgery;  Laterality: Right;  RIGHT L3-4 L4-5 ANTEROLATERAL LUMBAR INTERBODY FUSION   BIOPSY  12/16/2020   Procedure: BIOPSY;  Surgeon: Kerin Salen, MD;  Location: [facility];  Service: Gastroenterology;;   CARDIAC CATHETERIZATION  03/03/2008   Left heart cardiac catheterization and coronary   CARDIAC CATHETERIZATION  1997   Dr Donnie Aho   CATARACT EXTRACTION W/ INTRAOCULAR LENS  IMPLANT, BILATERAL     2016   COLONOSCOPY  2011   ESOPHAGEAL DILATION     2008    ESOPHAGOGASTRODUODENOSCOPY N/A 12/16/2020   Procedure: ESOPHAGOGASTRODUODENOSCOPY (EGD);  Surgeon: Kerin Salen, MD;  Location: [facility];  Service: Gastroenterology;  Laterality: N/A;   ESOPHAGOGASTRODUODENOSCOPY (EGD) WITH PROPOFOL N/A 10/15/2022   Procedure: ESOPHAGOGASTRODUODENOSCOPY (EGD) WITH PROPOFOL;  Surgeon: Sherrilyn Rist, MD;  Location[facility];  Service: Gastroenterology;  Laterality: N/A;   INNER EAR SURGERY     EAR INJECTION FOR MENIERES   INSERTION OF MESH N/A 12/31/2022   Procedure: INSERTION OF MESH;  Surgeon: Axel Filler, MD;  Location: [facility] OR;  Service: General;  Laterality: N/A;   KNEE ARTHROSCOPY Right 1991, 1995   LUMBAR LAMINECTOMY  12/2010   LUMBAR LAMINECTOMY/DECOMPRESSION MICRODISCECTOMY Left 08/15/2016   Procedure: Left Left three- four Redo laminectomy;  Surgeon: Maeola Harman, MD;  Location: [facility] OR;  Service: Neurosurgery;  Laterality: Left;  Left L3-4 Redo laminectomy   LUMBAR PERCUTANEOUS PEDICLE SCREW 2 LEVEL N/A 04/18/2016   Procedure: LUMBAR THREE-FOUR, LUMBAR FOUR-FIVE PERCUTANEOUS PEDICLE SCREW;  Surgeon: Maeola Harman, MD;  Location: [facility] OR;  Service: Neurosurgery;  Laterality: N/A;   TOTAL HIP ARTHROPLASTY Left 04/22/2017   TOTAL  HIP ARTHROPLASTY Left 04/22/2017   Procedure: TOTAL HIP ARTHROPLASTY ANTERIOR APPROACH;  Surgeon: Marcene Corning, MD;  Location: [facility] OR;  Service: Orthopedics;  Laterality: Left;   TOTAL KNEE ARTHROPLASTY Right 11/21/2020   Procedure: RIGHT TOTAL KNEE ARTHROPLASTY;  Surgeon: Marcene Corning, MD;  Location: [facility];  Service: Orthopedics;  Laterality: Right;   XI ROBOTIC ASSISTED HIATAL HERNIA REPAIR N/A 12/31/2022   Procedure: XI ROBOTIC ASSISTED HIATAL HERNIA REPAIR WITH MESH AND FUNDOPLICATION;  Surgeon: Axel Filler, MD;  Location: [facility] OR;  Service: General;  Laterality: N/A;    FAMILY HISTORY: Family History  Problem Relation Age of Onset   Prostate cancer Father    Heart attack Brother    Kidney cancer  Brother    Brain cancer Brother     SOCIAL HISTORY: Social History   Socioeconomic History   Marital status: Widowed    Spouse name: Darel Hong   Number of children: 3   Years of education: college   Highest education level: Not on file  Occupational History    Comment: Retired   Tobacco Use   Smoking status: Some Days    Types: Pipe   Smokeless tobacco: Never   Tobacco comments:    08/06/2023 patient still smoke a pipe ocassionally   Vaping Use   Vaping status: Never Used  Substance and Sexual Activity   Alcohol use: Yes    Alcohol/week: 7.0 standard drinks of alcohol    Types: 7 Shots of liquor per week   Drug use: No   Sexual activity: Not on file  Other Topics Concern   Not on file  Social History Narrative   Patient is retired and lives at home.   Education college.   Caffeine one cup daily.   Social Drivers of Corporate investment banker Strain: Low Risk  (10/14/2022)   Overall Financial Resource Strain (CARDIA)    Difficulty of Paying Living Expenses: Not hard at all  Food Insecurity: No Food Insecurity (07/30/2023)   Hunger Vital Sign    Worried About Running Out of Food in the Last Year: Never true    Ran Out of Food in the Last Year: Never true  Transportation Needs: No Transportation Needs (07/30/2023)   PRAPARE - Administrator, Civil Service (Medical): No    Lack of Transportation (Non-Medical): No  Physical Activity: Sufficiently Active (10/14/2022)   Exercise Vital Sign    Days of Exercise per Week: 5 days    Minutes of Exercise per Session: 30 min  Stress: No Stress Concern Present (10/14/2022)   [employer] of Occupational Health - Occupational Stress Questionnaire    Feeling of Stress : Not at all  Social Connections: Moderately Isolated (07/30/2023)   Social Connection and Isolation Panel [NHANES]    Frequency of Communication with Friends and Family: More than three times a week    Frequency of Social Gatherings with Friends and  Family: More than three times a week    Attends Religious Services: More than 4 times per year    Active Member of [employer] or Organizations: No    Attends Banker Meetings: Never    Marital Status: Widowed  Intimate Partner Violence: Not At Risk (07/30/2023)   Humiliation, Afraid, Rape, and Kick questionnaire    Fear of Current or Ex-Partner: No    Emotionally Abused: No    Physically Abused: No    Sexually Abused: No     Margie Ege, Edrick Oh, DNP  [facility]  [address], [address] [place], [place] [address] [phone]

## 2023-09-10 ENCOUNTER — Encounter: Payer: Self-pay | Admitting: Neurology

## 2023-09-10 ENCOUNTER — Ambulatory Visit: Payer: Medicare Other | Admitting: Neurology

## 2023-09-10 VITALS — BP 128/87 | HR 86 | Ht 70.5 in | Wt 221.0 lb

## 2023-09-10 DIAGNOSIS — I4819 Other persistent atrial fibrillation: Secondary | ICD-10-CM

## 2023-09-10 DIAGNOSIS — G6289 Other specified polyneuropathies: Secondary | ICD-10-CM

## 2023-09-10 DIAGNOSIS — G2581 Restless legs syndrome: Secondary | ICD-10-CM | POA: Diagnosis not present

## 2023-09-10 DIAGNOSIS — G459 Transient cerebral ischemic attack, unspecified: Secondary | ICD-10-CM | POA: Diagnosis not present

## 2023-09-10 DIAGNOSIS — R799 Abnormal finding of blood chemistry, unspecified: Secondary | ICD-10-CM | POA: Diagnosis not present

## 2023-09-10 NOTE — Patient Instructions (Signed)
 Check Iron panel  Referral to Ophthalmology for vision change Continue to see primary care for management of vascular risk factors Strict management of vascular risk factors with a goal BP less than 130/90, A1c less than 7.0, LDL less than 70 for secondary stroke prevention Follow up in 6-8 months   Orders Placed This Encounter  Procedures   Iron, TIBC and Ferritin Panel   Ambulatory referral to Ophthalmology

## 2023-09-11 ENCOUNTER — Encounter: Payer: Self-pay | Admitting: Neurology

## 2023-09-11 ENCOUNTER — Telehealth: Payer: Self-pay | Admitting: Neurology

## 2023-09-11 LAB — IRON,TIBC AND FERRITIN PANEL
Ferritin: 59 ng/mL (ref 30–400)
Iron Saturation: 27 % (ref 15–55)
Iron: 79 ug/dL (ref 38–169)
Total Iron Binding Capacity: 298 ug/dL (ref 250–450)
UIBC: 219 ug/dL (ref 111–343)

## 2023-09-11 NOTE — Progress Notes (Signed)
 Chart reviewed, agree above plan ?

## 2023-09-11 NOTE — Telephone Encounter (Signed)
Referral for ophthalmology fax to [facility]. Phone: [phone], Fax: [phone].

## 2023-10-08 ENCOUNTER — Other Ambulatory Visit: Payer: Self-pay | Admitting: Neurology

## 2023-10-09 DIAGNOSIS — I480 Paroxysmal atrial fibrillation: Secondary | ICD-10-CM | POA: Diagnosis not present

## 2023-10-09 DIAGNOSIS — R55 Syncope and collapse: Secondary | ICD-10-CM | POA: Diagnosis not present

## 2023-10-13 ENCOUNTER — Encounter: Payer: Self-pay | Admitting: Cardiology

## 2023-10-13 ENCOUNTER — Ambulatory Visit: Attending: Cardiology | Admitting: Cardiology

## 2023-10-13 VITALS — BP 110/50 | HR 84 | Ht 70.5 in | Wt 225.0 lb

## 2023-10-13 DIAGNOSIS — I351 Nonrheumatic aortic (valve) insufficiency: Secondary | ICD-10-CM | POA: Diagnosis not present

## 2023-10-13 DIAGNOSIS — G459 Transient cerebral ischemic attack, unspecified: Secondary | ICD-10-CM

## 2023-10-13 DIAGNOSIS — I4821 Permanent atrial fibrillation: Secondary | ICD-10-CM

## 2023-10-13 NOTE — Progress Notes (Signed)
 [name] Date of Birth: [birth year] Medical Record #[id number]  History of Present Illness: [name] is seen for evaluation of new onset Afib. He has a history of mixed hyperlipidemia, HTN, and obesity.  He has been intolerant to statins due to severe myalgias. This includes lipitor at a dose of 20 mg daily and Crestor  10 mg 3 days a week. Welchol apparently made no change in his lipid levels. Niacin was associated with severe flushing.   He has no history of vascular disease. He had normal cardiac caths in 1998 and 2009. No history of PAD or CVA/TIA.  He apparently had afib in 1962 but no documented recurrence afterwards.   He was seen by his PCP in 2020 for routine physical and found to be in Afib with controlled rate. Not symptomatic. Started on anticoagulation with Eliquis . Echo showed Normal LV and valvular function. Moderate LAE.  He underwent right TKR in June 2023.   In July 2022 he was admitted with GI bleed. Hgb down to 8.3. transfused one unit of blood. EGD showed a large 10 cm hiatal hernia and multiple duodenal ulcers. NSAIDs discontinued and started on PPI.   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 Had this reduced with EGD. In August he underwent surgical repair.   He was  admitted from 07/30/2023 to 07/31/2023 after presenting with vision changes and confusion while driving. He described is vision changes as being "unable to process what he was seeing like a split screen." He also described double vision. This lasted for a couple of minutes. He reported a couple of other episodes of vision changes ad hyper-awareness over the last year. One of these episodes was reportedly related to a low heart rate and his Coreg  was stopped but then was subsequently restarted. EKG showed atrial fibrillation, rate 71 bpm, with no acute ischemic changes. Head CT, brain MRI, and head/ neck CTA were unremarkable. Echo showed LVEF of 55-60% with normal wall motion  motion, normal RV function, severe biatrial enlargement, mild AI, and mild MR. Episodes were felt to be either a small TIA vs cardiogenic/ syncope. He reported compliance with his Eliquis . Coreg  was stopped.   A couple of days ago he had another episode while in dining room. According to his friend he became "out of it". Eyes were open but not responsive. Started shaking and eyes rolled back in his head. Was out about 5 minutes but felt bad for 15. As he was coming out of it still not able to speak in complete sentence. States it felt like he was watching a film but there were gaps.   Current Outpatient Medications on File Prior to Visit  Medication Sig Dispense Refill   budesonide  (ENTOCORT EC ) 3 MG 24 hr capsule Take 9 mg by mouth daily.     clonazePAM  (KLONOPIN ) 0.5 MG tablet Take 1 tablet (0.5 mg total) by mouth at bedtime as needed for anxiety. 30 tablet 0   DULoxetine  (CYMBALTA ) 60 MG capsule TAKE ONE CAPSULE BY MOUTH EVERY MORNING. 30 capsule 9   ELIQUIS  5 MG TABS tablet TAKE ONE TABLET BY MOUTH TWICE DAILY. 60 tablet 5   Ferrous Gluconate (IRON ) 240 (27 Fe) MG TABS Take 1 tablet by mouth daily.     Multiple Vitamins-Minerals (MULTIVITAMIN PO) Take 1 tablet by mouth daily.     nitroGLYCERIN  (NITROSTAT ) 0.4 MG SL tablet Place 0.4 mg under the tongue every 5 (five) minutes as needed for chest  pain.     pantoprazole  (PROTONIX ) 40 MG tablet Patient takes 40 mg tablet by mouth in the morning and 40 mg tablet every other night     pregabalin  (LYRICA ) 100 MG capsule TAKE FOUR CAPSULES BY MOUTH AT BEDTIME. 120 capsule 5   rOPINIRole  (REQUIP ) 0.5 MG tablet TAKE ONE TABLET BY MOUTH EVERY EVENING 90 tablet 3   rosuvastatin  (CRESTOR ) 10 MG tablet TAKE 1 TABLET ONCE DAILY OR AS DIRECTED. 90 tablet 2   tamsulosin  (FLOMAX ) 0.4 MG CAPS capsule Take 0.4 mg by mouth daily.     No current facility-administered medications on file prior to visit.    Allergies  Allergen Reactions   Compazine  [Prochlorperazine Edisylate] Other (See Comments)    EXTRAPYRAMIDAL MOVEMENT [Involuntary muscle movement]   Penicillins Nausea And Vomiting, Rash and Other (See Comments)        Welchol [Colesevelam Hcl] Other (See Comments)    MYALGIAS [Leg aches]   Other     General anesthesia - constipation    Gemfibrozil Nausea Only    PATIENT TOLERATES   Red Yeast Rice [Cholestin] Other (See Comments)    flushing    Past Medical History:  Diagnosis Date   Anginal pain (HCC)    NONE IN 3 YEARS   Arthritis    Atrial fibrillation (HCC) 1962   Chest pain    a. 1998 Cath: nl cors;  b. 2009 Cath: nl cors.   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 Haunani Dickard Hawkins    Esophageal reflux    Essential hypertension    History of hiatal hernia    History of Paroxysmal atrial flutter (HCC)    Hyperlipidemia    Incontinence of urine    Leg pain, bilateral    Nocturia    PONV (postoperative nausea and vomiting)    CONSTIPATED   Scarlet fever 1945   Spinal stenosis    Staph skin infection    LEFT GROIN  04/11/16  TX W/ DOXYCYCLINE    Ulcerative proctitis [facility])     Past Surgical History:  Procedure Laterality Date   ANKLE RECONSTRUCTION Right 2007   [place]   ANTERIOR LAT LUMBAR FUSION Right 04/18/2016   Procedure: RIGHT LUMBAR THREE-FOUR, LUMBAR FOUR-FIVE ANTEROLATERAL LUMBAR INTERBODY FUSION;  Surgeon: Andrew Sells, MD;  Location: [facility] OR;  Service: Neurosurgery;  Laterality: Right;  RIGHT L3-4 L4-5 ANTEROLATERAL LUMBAR INTERBODY FUSION   BIOPSY  12/16/2020   Procedure: BIOPSY;  Surgeon: Andrew Ken, MD;  Location: [facility] ENDOSCOPY;  Service: Gastroenterology;;   CARDIAC CATHETERIZATION  03/03/2008   Left heart cardiac catheterization and coronary   CARDIAC CATHETERIZATION  1997   Dr [name]   CATARACT EXTRACTION W/ INTRAOCULAR LENS  IMPLANT, BILATERAL     2016   COLONOSCOPY  2011   ESOPHAGEAL DILATION     2008    ESOPHAGOGASTRODUODENOSCOPY N/A 12/16/2020   Procedure: ESOPHAGOGASTRODUODENOSCOPY (EGD);  Surgeon: Andrew Ken, MD;  Location: [facility];  Service: Gastroenterology;  Laterality: N/A;   ESOPHAGOGASTRODUODENOSCOPY (EGD) WITH PROPOFOL  N/A 10/15/2022   Procedure: ESOPHAGOGASTRODUODENOSCOPY (EGD) WITH PROPOFOL ;  Surgeon: Albertina Hugger, MD;  Location: [facility] ENDOSCOPY;  Service: Gastroenterology;  Laterality: N/A;   INNER EAR SURGERY     EAR INJECTION FOR MENIERES   INSERTION OF MESH N/A 12/31/2022   Procedure: INSERTION OF MESH;  Surgeon: Shela Derby, MD;  Location: [facility]  OR;  Service: General;  Laterality: N/A;   KNEE ARTHROSCOPY Right 1991, 1995   LUMBAR LAMINECTOMY  12/2010   LUMBAR LAMINECTOMY/DECOMPRESSION MICRODISCECTOMY Left 08/15/2016   Procedure: Left Left three- four Redo laminectomy;  Surgeon: Andrew Sells, MD;  Location: [facility] OR;  Service: Neurosurgery;  Laterality: Left;  Left L3-4 Redo laminectomy   LUMBAR PERCUTANEOUS PEDICLE SCREW 2 LEVEL N/A 04/18/2016   Procedure: LUMBAR THREE-FOUR, LUMBAR FOUR-FIVE PERCUTANEOUS PEDICLE SCREW;  Surgeon: Andrew Sells, MD;  Location: [facility] OR;  Service: Neurosurgery;  Laterality: N/A;   TOTAL HIP ARTHROPLASTY Left 04/22/2017   TOTAL HIP ARTHROPLASTY Left 04/22/2017   Procedure: TOTAL HIP ARTHROPLASTY ANTERIOR APPROACH;  Surgeon: Dayne Even, MD;  Location: [facility] OR;  Service: Orthopedics;  Laterality: Left;   TOTAL KNEE ARTHROPLASTY Right 11/21/2020   Procedure: RIGHT TOTAL KNEE ARTHROPLASTY;  Surgeon: Dayne Even, MD;  Location: [facility] ORS;  Service: Orthopedics;  Laterality: Right;   XI ROBOTIC ASSISTED HIATAL HERNIA REPAIR N/A 12/31/2022   Procedure: XI ROBOTIC ASSISTED HIATAL HERNIA REPAIR WITH MESH AND FUNDOPLICATION;  Surgeon: Shela Derby, MD;  Location: [facility] OR;  Service: General;  Laterality: N/A;    Social History   Tobacco Use  Smoking Status Some Days   Types: Pipe  Smokeless Tobacco Never  Tobacco Comments   08/06/2023 patient  still smoke a pipe ocassionally     Social History   Substance and Sexual Activity  Alcohol Use Yes   Alcohol/week: 7.0 standard drinks of alcohol   Types: 7 Shots of liquor per week    Family History  Problem Relation Age of Onset   Prostate cancer Father    Heart attack Brother    Kidney cancer Brother    Brain cancer Brother     Review of Systems: As noted in HPI.  All other systems were reviewed and are negative.  Physical Exam: BP (!) 110/50   Pulse 84   Ht 5' 10.5" (1.791 m)   Wt 225 lb (102.1 kg)   SpO2 92%   BMI 31.83 kg/m  GENERAL:  Well appearing WM in NAD. Walks with a cane HEENT:  PERRL, EOMI, sclera are clear. Oropharynx is clear. NECK:  No jugular venous distention, carotid upstroke brisk and symmetric, no bruits, no thyromegaly or adenopathy LUNGS:  Clear to auscultation bilaterally CHEST:  Unremarkable HEART:  IRRR,  PMI not displaced or sustained,S1 and S2 within normal limits, no S3, no S4: no clicks, no rubs, no murmurs ABD:  Soft, nontender. BS +, no masses or bruits. No hepatomegaly, no splenomegaly EXT:  2 + pulses throughout, no edema, no cyanosis no clubbing SKIN:  Warm and dry.  No rashes NEURO:  Alert and oriented x 3. Cranial nerves II through XII intact. PSYCH:  Cognitively intact  LABORATORY DATA: Lab Results  Component Value Date   WBC 6.3 07/31/2023   HGB 13.5 07/31/2023   HCT 41.1 07/31/2023   PLT 141 (L) 07/31/2023   GLUCOSE 95 08/06/2023   CHOL 79 07/31/2023   TRIG 90 07/31/2023   HDL 30 (L) 07/31/2023   LDLCALC 31 07/31/2023   ALT 25 07/31/2023   AST 34 07/31/2023   NA 140 08/06/2023   K 4.5 08/06/2023   CL 104 08/06/2023   CREATININE 1.41 (H) 08/06/2023   BUN 23 08/06/2023   CO2 21 08/06/2023   TSH 1.050 02/24/2023   INR 1.2 07/30/2023   HGBA1C 5.7 (H) 07/30/2023   Labs dated 12/26/16: A1c 5.8%, Hgb 14.2, creatinine 1.21. TSH normal.  Dated 07/07/18: A1c 5.6%. creatinine 1.34. potassium 5.5. TSH normal. CBC  normal. Dated 01/04/19: A1c 5.7% Dated 06/03/19: creatinine 1.32. CMET and CBC normal.  Dated 09/06/20: cholesterol 169, triglycerides 169, HDL 29, LDL 110, A1c 5.5%. creatinine 1.4. otherwise CMET normal. CBC normal. Dated 03/08/21: cholesterol 94, triglycerides 97, HDL 32, LDL 44. A1c 5.6%. creatinine 1.4. otherwise CBC, CMET and TSH normal. Dated 04/09/22: A1c 5.3%. creatinine 1.47. otherwise chemistries normal. Hgb 12.5.  Dated 5/10-/24: cholesterol 109, triglycerides 169, HDL 30, LDL 50.   Ecg not done today  Echo 07/09/18: IMPRESSIONS      1. The left ventricle has normal systolic function of 55-60%. The cavity size was normal. There is no increased left ventricular wall thickness. Left ventricular diastology could not be evaluated secondary to atrial fibrillation.  2. The right ventricle has normal systolic function. The cavity was mildly enlarged. There is no increase in right ventricular wall thickness.  3. Left atrial size was moderately dilated.  4. The mitral valve is normal in structure.  5. The tricuspid valve is normal in structure.  6. The aortic valve is normal in structure. Aortic valve regurgitation is mild by color flow Doppler.  7. The pulmonic valve was normal in structure.  8. There is mild dilatation of the ascending aorta.   FINDINGS  Left Ventricle: The left ventricle has normal systolic function of 55-60%. The cavity size was normal. There is no increased left ventricular wall thickness. Left ventricular diastology could not be evaluated secondary to atrial fibrillation. Right Ventricle: The right ventricle has normal systolic function. The cavity was mildly enlarged. There is no increase in right ventricular wall thickness. Left Atrium: left atrial size was moderately dilated Right Atrium: right atrial size was normal in size Interatrial Septum: No atrial level shunt detected by color flow Doppler.  Echo 07/31/23: IMPRESSIONS     1. Left ventricular ejection  fraction, by estimation, is 55 to 60%. The  left ventricle has normal function. The left ventricle has no regional  wall motion abnormalities. Left ventricular diastolic parameters are  indeterminate.   2. Right ventricular systolic function is normal. The right ventricular  size is mildly enlarged. There is mildly elevated pulmonary artery  systolic pressure.   3. Left atrial size was severely dilated.   4. Right atrial size was severely dilated.   5. The mitral valve is normal in structure. Mild mitral valve  regurgitation. No evidence of mitral stenosis.   6. The aortic valve is tricuspid. Aortic valve regurgitation is mild. No  aortic stenosis is present.   Comparison(s): Prior images reviewed side by side. Atrial dilation from  prior.   Event monitor 08/28/23: Study Highlights      Atrial fibrillation with average rate 93 bpm, range 52-199.   Otherwise rare ectopy   Symptoms of lightheadedness appear to be associated with higher HR     Patch Wear Time:  13 days and 18 hours (2025-03-08T12:19:59-0500 to 2025-03-22T07:56:31-0400)   Atrial Fibrillation occurred continuously (100% burden), ranging from 52-199 bpm (avg of 93 bpm). Isolated VEs were rare (<1.0%, 13874), VE Couplets were rare (<1.0%, 105), and VE Triplets were rare (<1.0%, 5). Ventricular Bigeminy and Trigeminy were  present.       Assessment / Plan: 1. Atrial fibrillation- persistent/ permanent.  He is asymptomatic. Rate overall ok on event monitor. I reviewed this with him. While some variability in HR there were no sustained tachy or brady arrhythmias and no pauses. On no rate slowing therapy.  Now on Eliquis  for anticoagulation.   2. Recurrent episodes of altered mentation and visual changes. 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 With last episode consider seizure. He is seeing Dr Gracie Lav again in 2 days. Consider EEG. ? Whether we should switch anticoagulant to Pradaxa or should we add ASA 81 mg daily. I don't think his symptoms  are cardiac. No longer on any antihypertensive therapy. Afib rate is satisfactory. Encouraged him to maintain good hydration. I have also advised him not to drive.   3. Mixed hyperlipidemia. Now on Crestor  labs followed by PCP  4. HTN- well controlled. On no meds  5. Normal cardiac cath 2009.  6.  History of GI bleed secondary to duodenal ulcers.   I will follow up in 4 months

## 2023-10-13 NOTE — Patient Instructions (Addendum)
 Medication Instructions:  Continue same medications *If you need a refill on your cardiac medications before your next appointment, please call your pharmacy*  Lab Work: None ordered  Testing/Procedures: None ordered  Follow-Up: At [facility], you and your health needs are our priority.  As part of our continuing mission to provide you with exceptional heart care, our providers are all part of one team.  This team includes your primary Cardiologist (physician) and Advanced Practice Providers or APPs (Physician Assistants and Nurse Practitioners) who all work together to provide you with the care you need, when you need it.  Your next appointment:  4 months    Tuesday 9/2 at 2:00 pm    Provider:  Dr.Jordan   We recommend signing up for the patient portal called "MyChart".  Sign up information is provided on this After Visit Summary.  MyChart is used to connect with patients for Virtual Visits (Telemedicine).  Patients are able to view lab/test results, encounter notes, upcoming appointments, etc.  Non-urgent messages can be sent to your provider as well.   To learn more about what you can do with MyChart, go to [email or web address].

## 2023-10-15 DIAGNOSIS — I700 Atherosclerosis of aorta: Secondary | ICD-10-CM | POA: Insufficient documentation

## 2023-10-15 DIAGNOSIS — D7589 Other specified diseases of blood and blood-forming organs: Secondary | ICD-10-CM | POA: Insufficient documentation

## 2023-10-15 DIAGNOSIS — K5900 Constipation, unspecified: Secondary | ICD-10-CM | POA: Insufficient documentation

## 2023-10-15 DIAGNOSIS — K529 Noninfective gastroenteritis and colitis, unspecified: Secondary | ICD-10-CM | POA: Insufficient documentation

## 2023-10-15 DIAGNOSIS — Z79899 Other long term (current) drug therapy: Secondary | ICD-10-CM | POA: Insufficient documentation

## 2023-10-15 DIAGNOSIS — N529 Male erectile dysfunction, unspecified: Secondary | ICD-10-CM | POA: Insufficient documentation

## 2023-10-15 DIAGNOSIS — D6869 Other thrombophilia: Secondary | ICD-10-CM | POA: Insufficient documentation

## 2023-10-15 DIAGNOSIS — Z8719 Personal history of other diseases of the digestive system: Secondary | ICD-10-CM | POA: Insufficient documentation

## 2023-10-15 DIAGNOSIS — R2689 Other abnormalities of gait and mobility: Secondary | ICD-10-CM | POA: Insufficient documentation

## 2023-10-15 DIAGNOSIS — N402 Nodular prostate without lower urinary tract symptoms: Secondary | ICD-10-CM | POA: Insufficient documentation

## 2023-10-16 ENCOUNTER — Encounter: Payer: Self-pay | Admitting: Neurology

## 2023-10-16 ENCOUNTER — Ambulatory Visit: Admitting: Neurology

## 2023-10-16 VITALS — BP 128/75 | HR 87

## 2023-10-16 DIAGNOSIS — R410 Disorientation, unspecified: Secondary | ICD-10-CM | POA: Diagnosis not present

## 2023-10-16 DIAGNOSIS — G2581 Restless legs syndrome: Secondary | ICD-10-CM | POA: Diagnosis not present

## 2023-10-16 DIAGNOSIS — M545 Low back pain, unspecified: Secondary | ICD-10-CM

## 2023-10-16 DIAGNOSIS — G8929 Other chronic pain: Secondary | ICD-10-CM

## 2023-10-16 MED ORDER — DIVALPROEX SODIUM ER 500 MG PO TB24: 500.0000 mg | ORAL_TABLET | Freq: Every day | ORAL | 11 refills | Status: DC
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
## 2023-10-23 ENCOUNTER — Ambulatory Visit: Admitting: Neurology

## 2023-10-23 DIAGNOSIS — G2581 Restless legs syndrome: Secondary | ICD-10-CM

## 2023-10-23 DIAGNOSIS — R4182 Altered mental status, unspecified: Secondary | ICD-10-CM

## 2023-10-23 DIAGNOSIS — R410 Disorientation, unspecified: Secondary | ICD-10-CM

## 2023-10-24 ENCOUNTER — Telehealth: Payer: Self-pay | Admitting: Neurology

## 2023-10-24 NOTE — Telephone Encounter (Signed)
 I called patient, EEG showed no significant abnormalities.  Long history of atrial fibrillation, doing noticed cardiac arrhythmia on recording  Also reviewed the tracing with epileptologist Dr.Camara, will order 72 hours video EEG monitoring

## 2023-10-24 NOTE — Procedures (Signed)
   HISTORY: 83 year old male presenting with recurrent transient confusion, seizure-like activity  TECHNIQUE:  This is a routine 16 channel EEG recording with one channel devoted to a limited EKG recording.  It was performed during wakefulness, drowsiness and asleep.  Hyperventilation and photic stimulation were performed as activating procedures.  There are minimum muscle and movement artifact noted.  Upon maximum arousal, posterior dominant waking rhythm consistent of rhythmic alpha range activity. Activities are symmetric over the bilateral posterior derivations and attenuated with eye opening.     Photic stimulation did not alter the tracing.  Hyperventilation produced mild/moderate buildup with higher amplitude and the slower activities noted.  During EEG recording, patient developed drowsiness and no deeper stage of sleep was achieved, there was occasionally sharp contoured P7 ,T7 with doing sleepiness. During EEG recording, there was no epileptiform discharge noted.  EKG demonstrate irregular cardiac rhythm  CONCLUSION: This is a  normal awake EEG.  There is no significant abnormality found.  Kriss Perleberg, M.D. Ph.D.  [facility] [address] [place], [place] [address] Phone: [phone] Fax:      [phone]

## 2023-10-27 NOTE — Progress Notes (Signed)
 Virtual Visit via Video Note   Because of [name]'s co-morbid illnesses, he is at least at moderate risk for complications without adequate follow up.  This format is felt to be most appropriate for this patient at this time.  All issues noted in this document were discussed and addressed.  A limited physical exam was performed with this format.  Please refer to the patient's chart for his consent to telehealth for [facility].  Date:  11/06/2023   ID:  [name], DOB 26-Aug-1940, MRN [id number]  Patient Location: Home Provider Location: Office  PCP:  Helyn Lobstein, MD  Cardiologist:  Peter Swaziland, MD  Electrophysiologist:  None   Evaluation Performed:  Follow-Up Visit  Chief Complaint:  follow-up of atrial fibrillation  History of Present Illness:    [name] is a 83 y.o. 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 Swaziland and presents today for routine follow-up.  Patient was recently admitted in 07/2023 after presenting with vision changes and confusion. He described is vision changes as being "unable to process what he was seeing like a split screen." He also described double vision. This lasted for a couple of minutes. He reported a couple of other episodes of vision changes ad hyper-awareness over the last year. One of these episodes was reportedly related to a low heart rate and his Coreg  was stopped but then was subsequently restarted. EKG showed atrial fibrillation, rate 71 bpm, with no acute ischemic changes. Head CT, brain MRI, and head/ neck CTA were unremarkable. Echo showed LVEF of 55-60% with normal wall motion motion, normal RV  function, severe biatrial enlargement, mild AI, and mild MR. Episodes were felt to be either a small TIA vs cardiogenic/ syncope. He reported compliance with his Eliquis . Coreg  was stopped. He was advised to follow-up with Cardiology and Neurology. At follow-up visit in 08/2023, he reported brief episodes of mild lightheadedness/ dizziness. Orthostatic vital signs were negative.  Zio monitor was ordered and showed continuous atrial fibrillation with average rate of 93 bpm and rare ectopy but no significant brady or tachy arrhythmias.   He was last seen by Dr. Swaziland on 10/13/2023 at which time he reported another episode where he became "out of it" while eating. His eyes were reportedly open but he was not responsive. He started shaking and his eye rolled back in his head. He was unconscious for about 5 minutes. Symptoms were not felt to be cardiac and there was concern for recurrent TIA and possible seizures. He was then seen by Dr. Gracie Lav (Neurology) on 10/16/2023 and was started on Depakote  for seizure like episodes.   Patient presents for follow-up.  This visit is being completed via telehealth (video) because he is currently unable to drive and the person who was going to bring him to the office today got sick.  He was unable to tolerate the Depakote  due to unsteadiness on his feet, balance issues, and dizziness.  Neurology told him to stop this until he had the results of the EEG.  He completed the EEG last week but is still waiting on the results.  He was also recently prescribed prednisone for IBS but did not tolerate this.  However, from a cardiac standpoint he is doing well.  He denies any chest pain or shortness of breath.  He is unaware of his atrial fibrillation.  He states he will sometimes notice a prominent heartbeat but no significant palpitations.  No lightheadedness/dizziness (other than after starting Depakote ) and no near-syncope/syncope.  Past Medical History:  Diagnosis Date   Anginal pain  (HCC)    NONE IN 3 YEARS   Arthritis    Atrial fibrillation (HCC) 1962   Chest pain    a. 1998 Cath: nl cors;  b. 2009 Cath: nl cors.   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 PETER Swaziland    Esophageal reflux    Essential hypertension    History of hiatal hernia    History of Paroxysmal atrial flutter (HCC)    Hypercholesterolemia    Hyperlipidemia    Incontinence of urine    Leg pain, bilateral    Nocturia    PONV (postoperative nausea and vomiting)    CONSTIPATED   Scarlet fever 1945   Spinal stenosis    Staph skin infection    LEFT GROIN  04/11/16  [place] W/ DOXYCYCLINE    Ulcerative proctitis [facility])    Past Surgical History:  Procedure Laterality Date   ANKLE RECONSTRUCTION Right 2007   [place]   ANTERIOR LAT LUMBAR FUSION Right 04/18/2016   Procedure: RIGHT LUMBAR THREE-FOUR, LUMBAR FOUR-FIVE ANTEROLATERAL LUMBAR INTERBODY FUSION;  Surgeon: Manya Sells, MD;  Location: [facility] OR;  Service: Neurosurgery;  Laterality: Right;  RIGHT L3-4 L4-5 ANTEROLATERAL LUMBAR INTERBODY FUSION   BIOPSY  12/16/2020   Procedure: BIOPSY;  Surgeon: Genell Ken, MD;  Location: [facility] ENDOSCOPY;  Service: Gastroenterology;;   CARDIAC CATHETERIZATION  03/03/2008   Left heart cardiac catheterization and coronary   CARDIAC CATHETERIZATION  1997   Dr Anastasia Balo   CATARACT EXTRACTION W/ INTRAOCULAR LENS  IMPLANT, BILATERAL     2016   COLONOSCOPY  2011   ESOPHAGEAL DILATION     2008   ESOPHAGOGASTRODUODENOSCOPY N/A 12/16/2020   Procedure: ESOPHAGOGASTRODUODENOSCOPY (EGD);  Surgeon: Genell Ken, MD;  Location: [facility];  Service: Gastroenterology;  Laterality: N/A;   ESOPHAGOGASTRODUODENOSCOPY (EGD) WITH PROPOFOL  N/A 10/15/2022   Procedure: ESOPHAGOGASTRODUODENOSCOPY (EGD) WITH PROPOFOL ;  Surgeon: Albertina Hugger, MD;  Location: [facility] ENDOSCOPY;  Service: Gastroenterology;  Laterality: N/A;   INNER EAR SURGERY     EAR INJECTION FOR  MENIERES   INSERTION OF MESH N/A 12/31/2022   Procedure: INSERTION OF MESH;  Surgeon: Shela Derby, MD;  Location: [facility] OR;  Service: General;  Laterality: N/A;   KNEE ARTHROSCOPY Right 1991, 1995   LUMBAR LAMINECTOMY  12/2010   LUMBAR LAMINECTOMY/DECOMPRESSION MICRODISCECTOMY Left 08/15/2016   Procedure: Left Left three- four Redo laminectomy;  Surgeon: Manya Sells, MD;  Location: [facility] OR;  Service: Neurosurgery;  Laterality: Left;  Left L3-4 Redo laminectomy   LUMBAR PERCUTANEOUS PEDICLE SCREW 2 LEVEL N/A 04/18/2016   Procedure: LUMBAR THREE-FOUR, LUMBAR FOUR-FIVE PERCUTANEOUS PEDICLE SCREW;  Surgeon: Manya Sells, MD;  Location: [facility] OR;  Service: Neurosurgery;  Laterality: N/A;   TOTAL HIP ARTHROPLASTY Left 04/22/2017   TOTAL HIP ARTHROPLASTY Left 04/22/2017   Procedure: TOTAL HIP ARTHROPLASTY ANTERIOR APPROACH;  Surgeon: Dayne Even, MD;  Location: [facility] OR;  Service: Orthopedics;  Laterality: Left;   TOTAL KNEE ARTHROPLASTY Right 11/21/2020   Procedure: RIGHT TOTAL KNEE ARTHROPLASTY;  Surgeon: Dayne Even, MD;  Location: [facility] ORS;  Service: Orthopedics;  Laterality: Right;   X-STOP IMPLANTATION     X-STOP IMPLANTATION     XI ROBOTIC ASSISTED HIATAL HERNIA REPAIR N/A 12/31/2022   Procedure: XI ROBOTIC ASSISTED HIATAL HERNIA REPAIR WITH MESH AND FUNDOPLICATION;  Surgeon: Shela Derby, MD;  Location: [facility] OR;  Service: General;  Laterality: N/A;     Current Meds  Medication Sig   budesonide  (ENTOCORT EC ) 3 MG 24 hr capsule Take 9 mg by mouth daily.   clonazePAM  (KLONOPIN ) 0.5 MG tablet Take 1 tablet (0.5 mg total) by mouth at bedtime as needed for anxiety.   DULoxetine  (CYMBALTA ) 60 MG capsule TAKE ONE CAPSULE BY MOUTH EVERY MORNING.   ELIQUIS  5 MG TABS tablet TAKE ONE TABLET BY MOUTH TWICE DAILY.   Multiple Vitamins-Minerals (MULTIVITAMIN PO) Take 1 tablet by mouth daily.   nitroGLYCERIN  (NITROSTAT ) 0.4 MG SL tablet Place 0.4 mg under the tongue every 5 (five) minutes as needed for  chest pain.   pantoprazole  (PROTONIX ) 40 MG tablet Patient takes 40 mg tablet by mouth in the morning and 40 mg tablet every other night   pregabalin  (LYRICA ) 100 MG capsule TAKE FOUR CAPSULES BY MOUTH AT BEDTIME.   rOPINIRole  (REQUIP ) 0.5 MG tablet TAKE ONE TABLET BY MOUTH EVERY EVENING   rosuvastatin  (CRESTOR ) 10 MG tablet TAKE 1 TABLET ONCE DAILY OR AS DIRECTED.   tamsulosin  (FLOMAX ) 0.4 MG CAPS capsule Take 0.4 mg by mouth daily.     Allergies:   Compazine [prochlorperazine edisylate], Penicillins, Welchol [colesevelam hcl], Other, Prednisone, Gemfibrozil, and Red yeast rice [cholestin]   Social History   Tobacco Use   Smoking status: Some Days    Types: Pipe   Smokeless tobacco: Never   Tobacco comments:    08/06/2023 patient still smoke a pipe ocassionally   Vaping Use   Vaping status: Never Used  Substance Use Topics   Alcohol use: Yes    Alcohol/week: 7.0 standard drinks of alcohol    Types: 7 Shots of liquor per week   Drug use: No     Family Hx: [name] family history includes Brain cancer in his brother; Heart attack in his brother; Kidney cancer in his brother; Prostate cancer in his father.  ROS:   Please see the history of present illness.    All other systems reviewed and are negative.   Prior CV studies:    The following studies were reviewed:  Echocardiogram 07/31/2023: Impressions:  1. Left ventricular ejection fraction, by estimation, is 55 to 60%. The  left ventricle has normal function. The left ventricle has no regional  wall motion abnormalities. Left ventricular diastolic parameters are  indeterminate.   2. Right ventricular systolic function is normal. The right ventricular  size is mildly enlarged. There is mildly elevated pulmonary artery  systolic pressure.   3. Left atrial size was severely dilated.   4. Right atrial size was severely dilated.   5. The mitral valve is normal in structure. Mild mitral valve  regurgitation. No  evidence of mitral stenosis.   6. The aortic valve is tricuspid. Aortic valve regurgitation is mild. No  aortic stenosis is present.  _______________  Monitor 08/09/2023 to 08/23/2023:   Atrial fibrillation with average rate 93 bpm, range 52-199.   Otherwise rare ectopy   Symptoms of lightheadedness appear to be associated with higher HR     Patch Wear Time:  13 days and 18 hours (2025-03-08T12:19:59-0500 to 2025-03-22T07:56:31-0400)  Atrial Fibrillation occurred continuously (100% burden), ranging from 52-199 bpm (avg of 93 bpm). Isolated VEs were rare (<1.0%, 13874), VE Couplets were rare (<1.0%, 105), and VE Triplets were rare (<1.0%, 5). Ventricular Bigeminy and Trigeminy were  present.   Labs/Other Tests and Data Reviewed:    EKG:  Last EKG from 07/30/2023 was personally reviewed and showed: Atrial fibrillation, rate 71 bpm, with low voltage QRS and underlying artifact but no acute ischemic changes.  Recent Labs: 02/24/2023: TSH 1.050 07/31/2023: ALT 25; Hemoglobin 13.5; Platelets 141 08/06/2023: BUN 23; Creatinine, Ser 1.41; Magnesium  2.3; Potassium 4.5; Sodium 140   Recent Lipid Panel Lab Results  Component Value Date/Time   CHOL 79 07/31/2023 04:31 AM   CHOL 90 (L) 01/13/2017 08:46 AM   CHOL 146 11/10/2014 09:43 AM   TRIG 90 07/31/2023 04:31 AM   TRIG 175 (H) 11/10/2014 09:43 AM   HDL 30 (L) 07/31/2023 04:31 AM   HDL 29 (L) 01/13/2017 08:46 AM   HDL 27 (L) 11/10/2014 09:43 AM   CHOLHDL 2.6 07/31/2023 04:31 AM   LDLCALC 31 07/31/2023 04:31 AM   LDLCALC 33 01/13/2017 08:46 AM   LDLCALC 84 11/10/2014 09:43 AM    Wt Readings from Last 3 Encounters:  11/06/23 218 lb (98.9 kg)  10/13/23 225 lb (102.1 kg)  09/10/23 221 lb (100.2 kg)     Objective:    Vital Signs:  BP 138/87   Pulse 97   Ht 5' 10.5" (1.791 m)   Wt 218 lb (98.9 kg)   SpO2 98%   BMI 30.84 kg/m    Vital Signs Reviewed. General: No acute distress. Pulm: No labored breathing. No coughing during visit.  No audible wheezing. Speaking in full sentences. Neuro: Alert and oriented. No slurred speech. Answers questions appropriately. Psych: Pleasant affect.  ASSESSMENT & PLAN:    Permanent Atrial Fibrillation Rate controlled.  - Coreg  stopped during recent admission due to concern that this was the cause of her vision changes.  - Continue Eliquis  5mg  twice daily.   Mild Aortic Insufficiency Mild Mitral Regurgitation Noted on recent Echo in 07/2023.  - Can continue routine monitoring. Just just today Hypertension BP borderline elevated at 138/87 today. However, usually better controlled. - Not currently on any antihypertensives. Previously on Coreg  but this was recently stopped as above.  - Asked patient to continue to monitor BP at home and let us  know if consistently >130/80.   Hyperlipidemia Recent lipid panel in 07/2023: Total Cholesterol 79, Triglycerides 90, HDL 30, LDL 31.  - Continue Crestor  10mg  daily.    CKD Stage IIIa Baseline creatinine around 1.3 to 1.5.  I have run into this issue any other day BMP  Possible Seizures Patient has had recurrent episodes of LOC and vision changes (most recently episode in 10/2023). Recent monitor in 08/2023 showed continuous atrial fibrillation with average rate of 93 bpm and rare ectopy but no significant brady or tachy arrhythmias. These episodes have not been felt to be cardiac in nature. She was recently seen by Neurology and was started on Depakote  for possible seizures. - No recurrent episodes.  - He completed EEG last week.. Patient's states he has not heard back on the results yet. Per review of chart, looks like EEG was normal. - He was unable to tolerate Depakote  so Neurology told him to him to hold this until results of EEG come back. - Management per Neurology.   Time:   Today, I have spent 8 minutes and 50 seconds with  the patient with telehealth technology discussing the above problems.     Follow Up:  Patient already has an  in-person follow-up visit with Dr. Swaziland scheduled for 02/2024.  Signed, Yahaira Bruski E Genny Caulder, PA-C  11/06/2023 10:07 AM    [facility]

## 2023-10-28 ENCOUNTER — Telehealth: Payer: Self-pay | Admitting: Neurology

## 2023-10-28 ENCOUNTER — Encounter: Payer: Self-pay | Admitting: Neurology

## 2023-10-28 NOTE — Telephone Encounter (Signed)
 AON form completed and waiting MD signature

## 2023-10-28 NOTE — Telephone Encounter (Signed)
 [name]

## 2023-10-30 DIAGNOSIS — K529 Noninfective gastroenteritis and colitis, unspecified: Secondary | ICD-10-CM | POA: Diagnosis not present

## 2023-10-31 DIAGNOSIS — R410 Disorientation, unspecified: Secondary | ICD-10-CM | POA: Diagnosis not present

## 2023-10-31 DIAGNOSIS — R404 Transient alteration of awareness: Secondary | ICD-10-CM | POA: Diagnosis not present

## 2023-10-31 DIAGNOSIS — R569 Unspecified convulsions: Secondary | ICD-10-CM | POA: Diagnosis not present

## 2023-11-01 DIAGNOSIS — R569 Unspecified convulsions: Secondary | ICD-10-CM | POA: Diagnosis not present

## 2023-11-01 DIAGNOSIS — R404 Transient alteration of awareness: Secondary | ICD-10-CM | POA: Diagnosis not present

## 2023-11-01 DIAGNOSIS — R410 Disorientation, unspecified: Secondary | ICD-10-CM | POA: Diagnosis not present

## 2023-11-02 DIAGNOSIS — R569 Unspecified convulsions: Secondary | ICD-10-CM | POA: Diagnosis not present

## 2023-11-02 DIAGNOSIS — R410 Disorientation, unspecified: Secondary | ICD-10-CM | POA: Diagnosis not present

## 2023-11-02 DIAGNOSIS — R404 Transient alteration of awareness: Secondary | ICD-10-CM | POA: Diagnosis not present

## 2023-11-03 DIAGNOSIS — R410 Disorientation, unspecified: Secondary | ICD-10-CM | POA: Diagnosis not present

## 2023-11-03 DIAGNOSIS — R404 Transient alteration of awareness: Secondary | ICD-10-CM | POA: Diagnosis not present

## 2023-11-03 DIAGNOSIS — R569 Unspecified convulsions: Secondary | ICD-10-CM

## 2023-11-06 ENCOUNTER — Ambulatory Visit: Attending: Student | Admitting: Student

## 2023-11-06 ENCOUNTER — Encounter: Payer: Self-pay | Admitting: Cardiology

## 2023-11-06 ENCOUNTER — Encounter: Payer: Self-pay | Admitting: Student

## 2023-11-06 VITALS — BP 138/87 | HR 97 | Ht 70.5 in | Wt 218.0 lb

## 2023-11-06 DIAGNOSIS — I351 Nonrheumatic aortic (valve) insufficiency: Secondary | ICD-10-CM | POA: Diagnosis not present

## 2023-11-06 DIAGNOSIS — E785 Hyperlipidemia, unspecified: Secondary | ICD-10-CM | POA: Diagnosis not present

## 2023-11-06 DIAGNOSIS — I340 Nonrheumatic mitral (valve) insufficiency: Secondary | ICD-10-CM | POA: Diagnosis not present

## 2023-11-06 DIAGNOSIS — I10 Essential (primary) hypertension: Secondary | ICD-10-CM

## 2023-11-06 DIAGNOSIS — I4821 Permanent atrial fibrillation: Secondary | ICD-10-CM | POA: Diagnosis not present

## 2023-11-06 NOTE — Telephone Encounter (Signed)
 Error

## 2023-11-06 NOTE — Patient Instructions (Signed)
 Medication Instructions:  NO CHANGES *If you need a refill on your cardiac medications before your next appointment, please call your pharmacy*  Lab Work: NO LABS If you have labs (blood work) drawn today and your tests are completely normal, you will receive your results only by: MyChart Message (if you have MyChart) OR A paper copy in the mail If you have any lab test that is abnormal or we need to change your treatment, we will call you to review the results.  Testing/Procedures: NO TESTING  Follow-Up: At [facility], you and your health needs are our priority.  As part of our continuing mission to provide you with exceptional heart care, our providers are all part of one team.  This team includes your primary Cardiologist (physician) and Advanced Practice Providers or APPs (Physician Assistants and Nurse Practitioners) who all work together to provide you with the care you need, when you need it.  Your next appointment:   KEEP FOLLOW UP SEPTEMBER 2025  Provider:   Peter Swaziland, MD   Other Instructions MONITOR BLOOD PRESSURE

## 2023-11-08 ENCOUNTER — Encounter (INDEPENDENT_AMBULATORY_CARE_PROVIDER_SITE_OTHER): Admitting: Neurology

## 2023-11-08 DIAGNOSIS — G2581 Restless legs syndrome: Secondary | ICD-10-CM

## 2023-11-08 DIAGNOSIS — R410 Disorientation, unspecified: Secondary | ICD-10-CM

## 2023-11-08 NOTE — Procedures (Signed)
 Clinical History: 83 year old male being evaluated for recurrent episodes of confusion, staring, seizure-like event. Cardiac monitoring showed atrial fibrillation, which is at his baseline. MRI of the brain showed no significant structural abnormality  INTERMITTENT MONITORING with VIDEO TECHNICAL SUMMARY: This AVEEG was performed using equipment provided by [employer] utilizing Bluetooth ( [employer] ) amplifiers with continuous EEGT attended video collection using encrypted remote transmission via [employer] secured cellular tower network with data rates for each AVEEG performed. This is a Therapist, music AVEEG, obtained, according to the 10-20 international electrode placement system, reformatted digitally into referential and bipolar montages. Data was acquired with a minimum of 21 bipolar connections and sampled at a minimum rate of 250 cycles per second per channel, maximum rate of 450 cycles per second per channel and two channels for EKG. The entire VEEG study was recorded through cable and or radio telemetry for subsequent analysis. Specified epochs of the AVEEG data were identified at the direction of the subject by the depression of a push button by the patient. Each patients event file included data acquired two minutes prior to the push button activation and continuing until two minutes afterwards. AVEEG files were reviewed on [employer] server, Licensed Software provided by [employer] with a digital high frequency filter set at 70 Hz and a low frequency filter set at 1 Hz with a paper speed of 37mm/s resulting in 10 seconds per digital page. This entire AVEEG was reviewed by the EEG Technologist. Random time samples, random sleep samples, clips, patient initiated push button files with included patient daily diary logs, EEG Technologist pruned data was reviewed and verified for accuracy and validity by the governing reading neurologist in full details. This AEEGV was  fully compliant with all requirements for CPT 97500 for setup, patient education, take down and administered by an EEG technologist.  Long-Term EEG with Video was monitored intermittently by a qualified EEG technologist for the entirety of the recording; quality check-ins were performed at a minimum of every two hours, checking and documenting real-time data and video to assure the integrity and quality of the recording (e.g., camera position, electrode integrity and impedance), and identify the need for maintenance. For intermittent monitoring, an EEG Technologist monitored no more than 12 patients concurrently. Diagnostic video was captured at least 80% of the time during the recording.  PATIENT EVENTS: There were no patient events noted or captured during this recording.  TECHNOLOGIST EVENTS: No clear epileptiform activity was detected by the reviewing neurodiagnostic technologist for further review.  TIME SAMPLES: 10-minutes of every 2 hours recorded are reviewed as random time samples.  SLEEP SAMPLES: 5-minutes of every 24 hours recorded are reviewed as random sleep samples.  AWAKE: At maximal level of alertness, the posterior dominant background activity was continuous, reactive, low voltage rhythm of 9 Hz. This was symmetric, well-modulated, and attenuated with eye opening. Diffuse, symmetric, frontocentral beta range activity was present.  SLEEP: N1 Sleep (Stage 1) was observed and characterized by the disappearance of alpha rhythm and the appearance of vertex activity.  N2 Sleep (Stage 2) was observed and characterized by vertex waves, K-complexes, and sleep spindles.  N3 (Stage 3) sleep was observed and characterized by high amplitude Delta activity of 20%.  REM sleep was observed.  EKG: There were no arrhythmias or abnormalities noted during this recording.  Impression: Normal EEG: awake and asleep  Clinical Correlation: This is a normal 3-day ambulatory EEG tracing. No  focal abnormalities or epileptiform discharges were  seen. There were no electrographic seizures noted. No events were captured during the recording. Please note a normal EEG does not exclude the diagnosis of epilepsy   Cassandra Cleveland, MD [facility]

## 2023-11-10 ENCOUNTER — Ambulatory Visit: Payer: Self-pay | Admitting: Neurology

## 2023-11-10 DIAGNOSIS — G8929 Other chronic pain: Secondary | ICD-10-CM | POA: Diagnosis not present

## 2023-11-10 DIAGNOSIS — Z79899 Other long term (current) drug therapy: Secondary | ICD-10-CM | POA: Diagnosis not present

## 2023-11-10 DIAGNOSIS — K219 Gastro-esophageal reflux disease without esophagitis: Secondary | ICD-10-CM | POA: Diagnosis not present

## 2023-11-10 DIAGNOSIS — K5120 Ulcerative (chronic) proctitis without complications: Secondary | ICD-10-CM | POA: Diagnosis not present

## 2023-11-10 DIAGNOSIS — R410 Disorientation, unspecified: Secondary | ICD-10-CM | POA: Diagnosis not present

## 2023-11-10 DIAGNOSIS — I4819 Other persistent atrial fibrillation: Secondary | ICD-10-CM | POA: Diagnosis not present

## 2023-11-10 DIAGNOSIS — G2581 Restless legs syndrome: Secondary | ICD-10-CM | POA: Diagnosis not present

## 2023-11-10 DIAGNOSIS — N138 Other obstructive and reflux uropathy: Secondary | ICD-10-CM | POA: Diagnosis not present

## 2023-11-17 DIAGNOSIS — H26493 Other secondary cataract, bilateral: Secondary | ICD-10-CM | POA: Diagnosis not present

## 2023-11-17 DIAGNOSIS — H52203 Unspecified astigmatism, bilateral: Secondary | ICD-10-CM | POA: Diagnosis not present

## 2023-11-17 DIAGNOSIS — Z961 Presence of intraocular lens: Secondary | ICD-10-CM | POA: Diagnosis not present

## 2023-11-17 DIAGNOSIS — H5310 Unspecified subjective visual disturbances: Secondary | ICD-10-CM | POA: Diagnosis not present

## 2023-11-24 ENCOUNTER — Encounter (INDEPENDENT_AMBULATORY_CARE_PROVIDER_SITE_OTHER): Payer: Self-pay | Admitting: Neurology

## 2023-11-24 ENCOUNTER — Other Ambulatory Visit: Payer: Self-pay

## 2023-11-24 ENCOUNTER — Encounter: Payer: Self-pay | Admitting: Cardiology

## 2023-11-24 DIAGNOSIS — G459 Transient cerebral ischemic attack, unspecified: Secondary | ICD-10-CM

## 2023-11-24 DIAGNOSIS — M48062 Spinal stenosis, lumbar region with neurogenic claudication: Secondary | ICD-10-CM

## 2023-11-24 DIAGNOSIS — G6289 Other specified polyneuropathies: Secondary | ICD-10-CM

## 2023-11-24 DIAGNOSIS — M545 Low back pain, unspecified: Secondary | ICD-10-CM

## 2023-11-24 DIAGNOSIS — G8929 Other chronic pain: Secondary | ICD-10-CM

## 2023-11-24 DIAGNOSIS — R269 Unspecified abnormalities of gait and mobility: Secondary | ICD-10-CM

## 2023-11-24 NOTE — Telephone Encounter (Signed)
 Spoke to patient Dr.Jordan's advice given.Advised EP scheduler will call back with appointment.

## 2023-11-26 NOTE — Telephone Encounter (Signed)
 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    This patient gave consent for this Medical Advice Message and is aware that it may result in a bill to [employer], as well as the possibility of receiving a bill for a co-payment or deductible. They are an established patient, but are not seeking medical advice exclusively about a problem treated during an in person or video visit in the last seven days. I did not recommend an in person or video visit within seven days of my reply.    I spent a total of 12 minutes cumulative time within 7 days through [employer].  Modena Callander, MD

## 2023-11-27 NOTE — Telephone Encounter (Signed)
 MRI: no auth required sent to GI [id number]

## 2023-11-27 NOTE — Telephone Encounter (Signed)
 Spoke with patient and scheduled NCV/EMG for the first available appointment which was 01/28/24 at 9:30am. Appointment has been added to wait list and I will keep an eye out for a sooner opening

## 2023-12-01 DIAGNOSIS — I4819 Other persistent atrial fibrillation: Secondary | ICD-10-CM | POA: Diagnosis not present

## 2023-12-01 DIAGNOSIS — E782 Mixed hyperlipidemia: Secondary | ICD-10-CM | POA: Diagnosis not present

## 2023-12-17 ENCOUNTER — Ambulatory Visit
Admission: RE | Admit: 2023-12-17 | Discharge: 2023-12-17 | Disposition: A | Discharge status: Home / Self Care | Source: Ambulatory Visit | Attending: Neurology | Admitting: Neurology

## 2023-12-17 DIAGNOSIS — G6289 Other specified polyneuropathies: Secondary | ICD-10-CM

## 2023-12-17 DIAGNOSIS — M545 Low back pain, unspecified: Secondary | ICD-10-CM

## 2023-12-17 DIAGNOSIS — R269 Unspecified abnormalities of gait and mobility: Secondary | ICD-10-CM

## 2023-12-22 ENCOUNTER — Ambulatory Visit: Payer: Self-pay | Admitting: Neurology
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
## 2023-12-25 ENCOUNTER — Encounter: Payer: Self-pay | Admitting: Cardiovascular Disease

## 2023-12-25 ENCOUNTER — Ambulatory Visit: Attending: Cardiovascular Disease | Admitting: Cardiovascular Disease

## 2023-12-25 VITALS — BP 120/82 | HR 84 | Ht 70.5 in | Wt 220.0 lb

## 2023-12-25 DIAGNOSIS — I4821 Permanent atrial fibrillation: Secondary | ICD-10-CM | POA: Diagnosis not present

## 2023-12-25 NOTE — Patient Instructions (Addendum)
 Medication Instructions:  Your physician recommends that you continue on your current medications as directed. Please refer to the Current Medication list given to you today.  *If you need a refill on your cardiac medications before your next appointment, please call your pharmacy*  Lab Work: None ordered.  You may go to any [facility] Location for your lab work:  [facility] - [address] ([facility]) - [address] [phone] [address]  [place] - [address]   [place]  - [address]   [place] - [address] - [address] [employer]  - [address] - [address] ([facility]   If you have labs (blood work) drawn today and your tests are completely normal, you will receive your results only by: [employer] (if you have MyChart)  If you have any lab test that is abnormal or we need to change your treatment, we will call you or send a MyChart message to review the results.  Testing/Procedures: Medication Instructions:  Your physician recommends that you continue on your current medications as directed. Please refer to the Current Medication list given to you today.  Labwork: None ordered.  Testing/Procedures:   Implantable Loop Recorder Placement, Care After This sheet gives you information about how to care for yourself after your procedure. Your health care provider may also give you more specific instructions. If you have problems or questions, contact your health care provider. What can I expect after the procedure? After the procedure, it is common to have: Soreness or discomfort near the incision. Some swelling or bruising near the incision.  Follow these instructions at home: Incision care  Monitor your cardiac device site for redness, swelling, and drainage. Call the device clinic at [phone] if you experience these symptoms  or fever/chills.  Keep the large square bandage on your site for 24 hours and then you may remove it yourself. Keep the steri-strips underneath in place.   You may shower after 72 hours / 3 days from your procedure with the steri-strips in place. They will usually fall off on their own, or may be removed after 10 days. Pat dry.   Avoid lotions, ointments, or perfumes over your incision until it is well-healed.  Please do not submerge in water until your site is completely healed.   Your device is MRI compatible.   Remote monitoring is used to monitor your cardiac device from home. This monitoring is scheduled every month by our office. It allows us  to keep an eye on the function of your device to ensure it is working properly.  If your wound site starts to bleed apply pressure.    For help with the monitor please call Med[employer]onic Monitor Support Specialist directly at [phone].    If you have any questions/concerns please call the device clinic at [phone].  Activity  Return to your normal activities.  General instructions Follow instructions from your health care provider about how to manage your implantable loop recorder and transmit the information. Learn how to activate a recording if this is necessary for your type of device. You may go through a metal detection gate, and you may let someone hold a metal detector over your chest. Show your ID card if needed. Do not have an MRI unless you check with your health care provider first. Take over-the-counter and prescription medicines only  as told by your health care provider. Keep all follow-up visits as told by your health care provider. This is important. Contact a health care provider if: You have redness, swelling, or pain around your incision. You have a fever. You have pain that is not relieved by your pain medicine. You have triggered your device because of fainting (syncope) or because of a heartbeat that feels like it  is racing, slow, fluttering, or skipping (palpitations). Get help right away if you have: Chest pain. Difficulty breathing. Summary After the procedure, it is common to have soreness or discomfort near the incision. Change your dressing as told by your health care provider. Follow instructions from your health care provider about how to manage your implantable loop recorder and transmit the information. Keep all follow-up visits as told by your health care provider. This is important. This information is not intended to replace advice given to you by your health care provider. Make sure you discuss any questions you have with your health care provider. Document Released: 05/01/2015 Document Revised: 07/05/2017 Document Reviewed: 07/05/2017 Elsevier Patient Education  2020 [employer].   Follow-Up: At [facility], you and your health needs are our priority.  As part of our continuing mission to provide you with exceptional heart care, we have created designated Provider Care Teams.  These Care Teams include your primary Cardiologist (physician) and Advanced Practice Providers (APPs -  Physician Assistants and Nurse Practitioners) who all work together to provide you with the care you need, when you need it.  We recommend signing up for the patient portal called MyChart.  Sign up information is provided on this After Visit Summary.  MyChart is used to connect with patients for Virtual Visits (Telemedicine).  Patients are able to view lab/test results, encounter notes, upcoming appointments, etc.  Non-urgent messages can be sent to your provider as well.   To learn more about what you can do with MyChart, go to [email or web address].    Your next appointment:   1 year(s)  The format for your next appointment:   In Person  Provider:   Dr Nancey, or one of the following Advanced Practice Providers on your designated Care Team:   Charlies Arthur, PA-C Ozell Jodie Passey, [place] Leotis Barrack, NP  Note: Remote monitoring is used to monitor your Pacemaker/ ICD from home. This monitoring reduces the number of office visits required to check your device to one time per year. It allows us  to keep an eye on the functioning of your device to ensure it is working properly.

## 2023-12-31 ENCOUNTER — Other Ambulatory Visit: Payer: Self-pay | Admitting: Neurology

## 2024-01-01 DIAGNOSIS — E782 Mixed hyperlipidemia: Secondary | ICD-10-CM | POA: Diagnosis not present

## 2024-01-01 DIAGNOSIS — I4819 Other persistent atrial fibrillation: Secondary | ICD-10-CM | POA: Diagnosis not present

## 2024-01-05 ENCOUNTER — Telehealth: Payer: Self-pay

## 2024-01-05 NOTE — Telephone Encounter (Signed)
 Alert received from [employer] for 1 symptom activation 8/2 @ 12:00, EGM c/w AF with some RVR, known AF, Eliquis  per EPIC .  Please see below highlighted area for patient symptoms.

## 2024-01-09 ENCOUNTER — Telehealth: Payer: Self-pay | Admitting: *Deleted

## 2024-01-09 NOTE — Telephone Encounter (Signed)
 Patient called with questions about upcoming Home Remote [employer] Ck on 02/09/24. Explained that this is an automated monthly report and he does not need to come into the office for these appointments. Pt verbalized understanding and appreciation of explanation.

## 2024-01-20 ENCOUNTER — Telehealth: Payer: Self-pay | Admitting: *Deleted

## 2024-01-20 NOTE — Telephone Encounter (Addendum)
 Alert remote transmission:  Symptom 1 new symptom activation 8/17 @ 17:46, EGM c/w AF/AFL some RVR - route to triage Follow up as scheduled. [place], CVRS ____________________________________________________________________________  Routing to MD for pt reported symptoms and awareness since patient not being considered for BB at this time as, the likelihood of rhythm control would be low and the risk not worth the benefit, per MD note 12/25/23.

## 2024-01-23 ENCOUNTER — Other Ambulatory Visit: Payer: Self-pay | Admitting: Pharmacist Clinician (PhC)/ Clinical Pharmacy Specialist

## 2024-01-23 MED ORDER — APIXABAN 5 MG PO TABS: 5.0000 mg | ORAL_TABLET | Freq: Two times a day (BID) | ORAL | 1 refills | Status: AC

## 2024-01-23 NOTE — Telephone Encounter (Signed)
 Prescription refill request for Eliquis  received. Indication: AF Last office visit: 7/25 Scr: 1.41 Age:  83 Weight: 99.8

## 2024-01-26 ENCOUNTER — Ambulatory Visit (INDEPENDENT_AMBULATORY_CARE_PROVIDER_SITE_OTHER)

## 2024-01-26 DIAGNOSIS — I4821 Permanent atrial fibrillation: Secondary | ICD-10-CM

## 2024-01-27 LAB — CUP PACEART REMOTE DEVICE CHECK
Date Time Interrogation Session: 20250824155344
Implantable Pulse Generator Implant Date: 20250724

## 2024-01-28 ENCOUNTER — Ambulatory Visit: Admitting: Neurology

## 2024-01-28 ENCOUNTER — Encounter: Payer: Self-pay | Admitting: Neurology

## 2024-01-28 DIAGNOSIS — G6289 Other specified polyneuropathies: Secondary | ICD-10-CM

## 2024-01-28 DIAGNOSIS — R269 Unspecified abnormalities of gait and mobility: Secondary | ICD-10-CM | POA: Diagnosis not present

## 2024-01-28 DIAGNOSIS — M545 Low back pain, unspecified: Secondary | ICD-10-CM

## 2024-01-28 DIAGNOSIS — G8929 Other chronic pain: Secondary | ICD-10-CM

## 2024-01-28 MED ORDER — PREGABALIN 100 MG PO CAPS: 100.0000 mg | ORAL_CAPSULE | Freq: Four times a day (QID) | ORAL | 3 refills | Status: DC

## 2024-01-28 MED ORDER — DULOXETINE HCL 60 MG PO CPEP: 60.0000 mg | ORAL_CAPSULE | Freq: Every morning | ORAL | 3 refills | Status: AC
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
## 2024-01-29 ENCOUNTER — Telehealth: Payer: Self-pay | Admitting: Neurology

## 2024-01-29 NOTE — Telephone Encounter (Signed)
 Referral to Physical Therapy Faxed to  [facility]  Physical Therapy   Teresa Dustman  Physical Therapy  Phone: [phone] [phone]

## 2024-01-30 NOTE — Procedures (Signed)
 Full Name: [name] Gender: Male MRN #: [id number] Date of Birth: May 05, 1941    Visit Date: 01/28/2024 09:56 Age: 83 Years Examining Physician: Onita Duos Referring Physician: Onita Duos Height: 5 feet 10 inch History: 83 year old male complains of worsening lower and upper extremity intermittent paresthesia gait abnormality, long history of restless leg syndrome, lumbar decompression surgery in the past.  Summary of the test: Nerve conduction study: Left sural, superficial peroneal sensory responses were absent.  Left tibial, peroneal to EDB motor responses were absent.  Bilateral ulnar sensory response showed slightly decreased snap amplitude.  Bilateral median sensory response showed mildly prolonged peak latency and moderately decreased snap amplitude.  Bilateral radial sensory response also showed mildly decreased snap amplitude  Bilateral ulnar motor responses showed no significant abnormality.  Bilateral median motor response showed mildly prolonged distal latency, with mildly decreased CMAP amplitude  Electromyography:  Selected needle examination was performed at bilateral lower extremity muscles, lumbosacral paraspinal muscles.  There is evidence of chronic neuropathic changes involving bilateral L2-3-4-5 myotomes.  There was well-healed lumbar surgical scar, there was no active denervation.  There was also evidence of mildly decreased recruitment at the left first dorsal interossei.  There was no spontaneous activity at left cervical paraspinal muscles.  Conclusion: This is an abnormal study.  There is electrodiagnostic evidence of chronic bilateral lumbosacral radiculopathy involving bilateral L2-3-4 5 myotomes, there was no evidence of active process.  This is superimposed on a background of moderately severe axonal sensorimotor polyneuropathy.  Compared to previous study in November 2022, there is only mild progression of his  neuropathy.    ------------------------------- Duos Onita. M.D. Ph.D.   [facility] [address], [address] [place], [place] [address] Tel: [phone] Fax: [phone]  Verbal informed consent was obtained from the patient, patient was informed of potential risk of procedure, including bruising, bleeding, hematoma formation, infection, muscle weakness, muscle pain, numbness, among others.        MNC    Nerve / Sites Muscle Latency Ref. Amplitude Ref. Rel Amp Segments Distance Velocity Ref. Area    ms ms mV mV %  cm m/s m/s mVms  R Median - APB     Wrist APB 5.0 <=4.4 3.1 >=4.0 100 Wrist - APB 7   11.0     Upper arm APB 9.7  3.2  105 Upper arm - Wrist 24 51 >=49 12.1  L Median - APB     Wrist APB 4.5 <=4.4 3.1 >=4.0 100 Wrist - APB 7   9.3     Upper arm APB 9.6  3.2  102 Upper arm - Wrist 25 50 >=49 10.1  R Ulnar - ADM     Wrist ADM 3.2 <=3.3 10.1 >=6.0 100 Wrist - ADM 7   28.7     B.Elbow ADM 6.1  10.4  104 B.Elbow - Wrist 17 57 >=49 26.5     A.Elbow ADM 8.5  10.0  95.5 A.Elbow - B.Elbow 13 55 >=49 25.7  L Ulnar - ADM     Wrist ADM 3.5 <=3.3 11.1 >=6.0 100 Wrist - ADM 7   27.7     B.Elbow ADM 5.7  9.6  87 B.Elbow - Wrist 13 59 >=49 24.8     A.Elbow ADM 8.7  9.3  96.3 A.Elbow - B.Elbow 16 53 >=49 26.9  L Peroneal - EDB     Ankle EDB NR <=6.5 NR >=2.0 NR Ankle - EDB 9   NR  Pop fossa - Ankle      L Tibial - AH     Ankle AH NR <=5.8 NR >=4.0 NR Ankle - AH 9   NR                 SNC    Nerve / Sites Rec. Site Peak Lat Ref.  Amp Ref. Segments Distance    ms ms V V  cm  L Radial - Anatomical snuff box (Forearm)     Forearm Wrist 2.7 <=2.9 10 >=15 Forearm - Wrist 10  R Radial - Anatomical snuff box (Forearm)     Forearm Wrist 2.9 <=2.9 9 >=15 Forearm - Wrist 10  L Sural - Ankle (Calf)     Calf Ankle NR <=4.4 NR >=6 Calf - Ankle 14  L Superficial peroneal - Ankle     Lat leg Ankle NR <=4.4 NR >=6 Lat leg - Ankle 14  R Median - Orthodromic (Dig  II, Mid palm)     Dig II Wrist 3.6 <=3.4 4 >=10 Dig II - Wrist 13  L Median - Orthodromic (Dig II, Mid palm)     Dig II Wrist 3.7 <=3.4 5 >=10 Dig II - Wrist 13  R Ulnar - Orthodromic, (Dig V, Mid palm)     Dig V Wrist 3.1 <=3.1 4 >=5 Dig V - Wrist 11  L Ulnar - Orthodromic, (Dig V, Mid palm)     Dig V Wrist 3.1 <=3.1 4 >=5 Dig V - Wrist 31                     F  Wave    Nerve F Lat Ref.   ms ms  R Ulnar - ADM 31.7 <=32.0  L Ulnar - ADM 33.0 <=32.0         EMG Summary Table    Spontaneous MUAP Recruitment  Muscle IA Fib PSW Fasc Other Amp Dur. Poly Pattern  L. Tibialis anterior Normal None None None _______ Normal Normal Normal Reduced  L. Tibialis posterior Normal None None None _______ Normal Normal Normal Reduced  L. Gastrocnemius (Medial head) Normal None None None _______ Normal Normal Normal Reduced  L. Vastus lateralis Normal None None None _______ Normal Normal Normal Reduced  L. Adductor longus Normal None None None _______ Normal Normal Normal Reduced  L. Gluteus medius Normal None None None _______ Normal Normal Normal Reduced  L. Lumbar paraspinals (low) Normal None None None _______ Normal Normal Normal Normal  L. Lumbar paraspinals (mid) Normal None None None _______ Normal Normal Normal Normal  R. Lumbar paraspinals (low) Normal None None None _______ Normal Normal Normal Normal  R. Lumbar paraspinals (mid) Normal None None None _______ Normal Normal Normal Normal  R. Tibialis anterior Normal None None None _______ Normal Normal Normal Reduced  R. Tibialis posterior Normal None None None _______ Normal Normal Normal Reduced  R. Gastrocnemius (Medial head) Normal None None None _______ Normal Normal Normal Reduced  R. Vastus lateralis Normal None None None _______ Normal Normal Normal Reduced  R. Iliopsoas Normal None None None _______ Normal Normal Normal Reduced  L. Iliopsoas Normal None None None _______ Normal Normal Normal Reduced  L. First dorsal interosseous  Normal None None None _______ Normal Normal Normal Reduced  L. Pronator teres Normal None None None _______ Normal Normal Normal Normal  L. Biceps brachii Normal None None None _______ Normal Normal Normal Normal  L. Deltoid Normal None None None _______ Normal Normal Normal  Normal  L. Triceps brachii Normal None None None _______ Normal Normal Normal Normal  L. Cervical paraspinals Normal None None None _______ Normal Normal Normal Normal

## 2024-01-31 NOTE — Progress Notes (Unsigned)
 [name] Date of Birth: 02/13/1941 Medical Record #[id number]  History of Present Illness: [name] is seen for evaluation of new onset Afib. He has a history of mixed hyperlipidemia, HTN, and obesity.  He has been intolerant to statins due to severe myalgias. This includes lipitor at a dose of 20 mg daily and Crestor  10 mg 3 days a week. Welchol apparently made no change in his lipid levels. Niacin was associated with severe flushing.   He has no history of vascular disease. He had normal cardiac caths in 1998 and 2009. No history of PAD or CVA/TIA.  He apparently had afib in 1962 but no documented recurrence afterwards.   He was seen by his PCP in 2020 for routine physical and found to be in Afib with controlled rate. Not symptomatic. Started on anticoagulation with Eliquis . Echo showed Normal LV and valvular function. Moderate LAE.  He underwent right TKR in June 2023.   In July 2022 he was admitted with GI bleed. Hgb down to 8.3. transfused one unit of blood. EGD showed a large 10 cm hiatal hernia and multiple duodenal ulcers. NSAIDs discontinued and started on PPI.   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 Had this reduced with EGD. In August he underwent surgical repair.   He was  admitted from 07/30/2023 to 07/31/2023 after presenting with vision changes and confusion while driving. He described is vision changes as being unable to process what he was seeing like a split screen. He also described double vision. This lasted for a couple of minutes. He reported a couple of other episodes of vision changes ad hyper-awareness over the last year. One of these episodes was reportedly related to a low heart rate and his Coreg  was stopped but then was subsequently restarted. EKG showed atrial fibrillation, rate 71 bpm, with no acute ischemic changes. Head CT, brain MRI, and head/ neck CTA were unremarkable. Echo showed LVEF of 55-60% with normal wall motion  motion, normal RV function, severe biatrial enlargement, mild AI, and mild MR. Episodes were felt to be either a small TIA vs cardiogenic/ syncope. He reported compliance with his Eliquis . Coreg  was stopped.   When I last saw him in May  he had another episode while in dining room. According to his friend he became out of it. Eyes were open but not responsive. Started shaking and eyes rolled back in his head. Was out about 5 minutes but felt bad for 15. As he was coming out of it still not able to speak in complete sentence. States it felt like he was watching a film but there were gaps.   He was seen by EP and had an ILR placed in July. On initial check in August noted Afib without significant brady, pauses or sustained tachycardia. EEG was done and was normal.   He has not had any more spells. Does note some SOB with exertion. No swelling or change in weight. Was doing water aerobics 3x/wk but notes pool has been closed for a while. PT ordered.    Current Outpatient Medications on File Prior to Visit  Medication Sig Dispense Refill   apixaban  (ELIQUIS ) 5 MG TABS tablet Take 1 tablet (5 mg total) by mouth 2 (two) times daily. 180 tablet 1   budesonide  (ENTOCORT EC ) 3 MG 24 hr capsule Take 9 mg by mouth daily.     DULoxetine  (CYMBALTA ) 60 MG capsule Take 1 capsule (60 mg total) by  mouth every morning. 90 capsule 3   Multiple Vitamins-Minerals (MULTIVITAMIN PO) Take 1 tablet by mouth daily.     pantoprazole  (PROTONIX ) 40 MG tablet Patient takes 40 mg tablet by mouth in the morning and 40 mg tablet every other night     pregabalin  (LYRICA ) 100 MG capsule Take 400 mg by mouth at bedtime.     rOPINIRole  (REQUIP ) 0.5 MG tablet TAKE ONE TABLET BY MOUTH EVERY EVENING. 30 tablet 10   rosuvastatin  (CRESTOR ) 10 MG tablet TAKE 1 TABLET ONCE DAILY OR AS DIRECTED. 90 tablet 2   simethicone  (GAS-X EXTRA STRENGTH) 125 MG chewable tablet Chew 125 mg by mouth every 6 (six) hours as needed.     tamsulosin   (FLOMAX ) 0.4 MG CAPS capsule Take 0.4 mg by mouth daily.     clonazePAM  (KLONOPIN ) 0.5 MG tablet Take 1 tablet (0.5 mg total) by mouth at bedtime as needed for anxiety. (Patient not taking: Reported on 02/03/2024) 30 tablet 0   nitroGLYCERIN  (NITROSTAT ) 0.4 MG SL tablet Place 0.4 mg under the tongue every 5 (five) minutes as needed for chest pain. (Patient not taking: Reported on 02/03/2024)     No current facility-administered medications on file prior to visit.    Allergies  Allergen Reactions   Compazine [Prochlorperazine Edisylate] Other (See Comments)    EXTRAPYRAMIDAL MOVEMENT [Involuntary muscle movement]   Penicillins Nausea And Vomiting, Rash and Other (See Comments)        Welchol [Colesevelam Hcl] Other (See Comments)    MYALGIAS [Leg aches]   Divalproex  Sodium Other (See Comments)   Other     General anesthesia - constipation    Prednisone     Other Reaction(s): sweats, felt bad, itching   Gemfibrozil Nausea Only    PATIENT TOLERATES   Red Yeast Rice [Cholestin] Other (See Comments)    flushing    Past Medical History:  Diagnosis Date   Anginal pain (HCC)    NONE IN 3 YEARS   Arthritis    Atrial fibrillation (HCC) 1962   Chest pain    a. 1998 Cath: nl cors;  b. 2009 Cath: nl cors.   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 Antavius Sperbeck [place]    Esophageal reflux    Essential hypertension    History of hiatal hernia    History of Paroxysmal atrial flutter (HCC)    Hypercholesterolemia    Hyperlipidemia    Incontinence of urine    Leg pain, bilateral    Nocturia    PONV (postoperative nausea and vomiting)    CONSTIPATED   Scarlet fever 1945   Spinal stenosis    Staph skin infection    LEFT GROIN  04/11/16  TX W/ DOXYCYCLINE    Ulcerative proctitis (HCC)     Past Surgical History:  Procedure Laterality Date   ANKLE RECONSTRUCTION Right 2007   [place]   ANTERIOR LAT LUMBAR FUSION Right  04/18/2016   Procedure: RIGHT LUMBAR THREE-FOUR, LUMBAR FOUR-FIVE ANTEROLATERAL LUMBAR INTERBODY FUSION;  Surgeon: Fairy Levels, MD;  Location: [facility] OR;  Service: Neurosurgery;  Laterality: Right;  RIGHT L3-4 L4-5 ANTEROLATERAL LUMBAR INTERBODY FUSION   BIOPSY  12/16/2020   Procedure: BIOPSY;  Surgeon: Saintclair Jasper, MD;  Location: [facility];  Service: Gastroenterology;;   CARDIAC CATHETERIZATION  03/03/2008   Left heart cardiac catheterization and coronary   CARDIAC CATHETERIZATION  1997  Dr Blanca   CATARACT EXTRACTION W/ INTRAOCULAR LENS  IMPLANT, BILATERAL     2016   COLONOSCOPY  2011   ESOPHAGEAL DILATION     2008   ESOPHAGOGASTRODUODENOSCOPY N/A 12/16/2020   Procedure: ESOPHAGOGASTRODUODENOSCOPY (EGD);  Surgeon: Saintclair Jasper, MD;  Location: [facility];  Service: Gastroenterology;  Laterality: N/A;   ESOPHAGOGASTRODUODENOSCOPY (EGD) WITH PROPOFOL  N/A 10/15/2022   Procedure: ESOPHAGOGASTRODUODENOSCOPY (EGD) WITH PROPOFOL ;  Surgeon: Legrand Victory LITTIE DOUGLAS, MD;  Location: [facility] ENDOSCOPY;  Service: Gastroenterology;  Laterality: N/A;   INNER EAR SURGERY     EAR INJECTION FOR MENIERES   INSERTION OF MESH N/A 12/31/2022   Procedure: INSERTION OF MESH;  Surgeon: Rubin Calamity, MD;  Location: [facility] OR;  Service: General;  Laterality: N/A;   KNEE ARTHROSCOPY Right 1991, 1995   LUMBAR LAMINECTOMY  12/2010   LUMBAR LAMINECTOMY/DECOMPRESSION MICRODISCECTOMY Left 08/15/2016   Procedure: Left Left three- four Redo laminectomy;  Surgeon: Fairy Levels, MD;  Location: [facility] OR;  Service: Neurosurgery;  Laterality: Left;  Left L3-4 Redo laminectomy   LUMBAR PERCUTANEOUS PEDICLE SCREW 2 LEVEL N/A 04/18/2016   Procedure: LUMBAR THREE-FOUR, LUMBAR FOUR-FIVE PERCUTANEOUS PEDICLE SCREW;  Surgeon: Fairy Levels, MD;  Location: [facility] OR;  Service: Neurosurgery;  Laterality: N/A;   TOTAL HIP ARTHROPLASTY Left 04/22/2017   TOTAL HIP ARTHROPLASTY Left 04/22/2017   Procedure: TOTAL HIP ARTHROPLASTY ANTERIOR APPROACH;   Surgeon: Sheril Coy, MD;  Location: [facility] OR;  Service: Orthopedics;  Laterality: Left;   TOTAL KNEE ARTHROPLASTY Right 11/21/2020   Procedure: RIGHT TOTAL KNEE ARTHROPLASTY;  Surgeon: Sheril Coy, MD;  Location: [facility]S;  Service: Orthopedics;  Laterality: Right;   X-STOP IMPLANTATION     X-STOP IMPLANTATION     XI ROBOTIC ASSISTED HIATAL HERNIA REPAIR N/A 12/31/2022   Procedure: XI ROBOTIC ASSISTED HIATAL HERNIA REPAIR WITH MESH AND FUNDOPLICATION;  Surgeon: Rubin Calamity, MD;  Location: [facility] OR;  Service: General;  Laterality: N/A;    Social History   Tobacco Use  Smoking Status Some Days   Types: Pipe  Smokeless Tobacco Never  Tobacco Comments   02/03/2024 patient still smoke a pipe some days   08/06/2023 patient still smoke a pipe ocassionally     Social History   Substance and Sexual Activity  Alcohol Use Yes   Alcohol/week: 7.0 standard drinks of alcohol   Types: 7 Shots of liquor per week    Family History  Problem Relation Age of Onset   Prostate cancer Father    Heart attack Brother    Kidney cancer Brother    Brain cancer Brother     Review of Systems: As noted in HPI.  All other systems were reviewed and are negative.  Physical Exam: BP 137/78 (BP Location: Left Arm, Patient Position: Sitting, Cuff Size: Normal)   Pulse 67   Ht 5' 10 (1.778 m)   Wt 225 lb (102.1 kg)   SpO2 98%   BMI 32.28 kg/m  GENERAL:  Well appearing WM in NAD. Walks with a cane HEENT:  PERRL, EOMI, sclera are clear. Oropharynx is clear. NECK:  No jugular venous distention, carotid upstroke brisk and symmetric, no bruits, no thyromegaly or adenopathy LUNGS:  Clear to auscultation bilaterally CHEST:  Unremarkable HEART:  IRRR,  PMI not displaced or sustained,S1 and S2 within normal limits, no S3, no S4: no clicks, no rubs, no murmurs ABD:  Soft, nontender. BS +, no masses or bruits. No hepatomegaly, no splenomegaly EXT:  2 + pulses throughout, no edema, no cyanosis no  clubbing SKIN:  Warm and dry.  No rashes NEURO:  Alert and oriented x 3. Cranial nerves II through XII intact. PSYCH:  Cognitively intact  LABORATORY DATA: Lab Results  Component Value Date   WBC 6.3 07/31/2023   HGB 13.5 07/31/2023   HCT 41.1 07/31/2023   PLT 141 (L) 07/31/2023   GLUCOSE 95 08/06/2023   CHOL 79 07/31/2023   TRIG 90 07/31/2023   HDL 30 (L) 07/31/2023   LDLCALC 31 07/31/2023   ALT 25 07/31/2023   AST 34 07/31/2023   NA 140 08/06/2023   K 4.5 08/06/2023   CL 104 08/06/2023   CREATININE 1.41 (H) 08/06/2023   BUN 23 08/06/2023   CO2 21 08/06/2023   TSH 1.050 02/24/2023   INR 1.2 07/30/2023   HGBA1C 5.7 (H) 07/30/2023   Labs dated 12/26/16: A1c 5.8%, Hgb 14.2, creatinine 1.21. TSH normal.  Dated 07/07/18: A1c 5.6%. creatinine 1.34. potassium 5.5. TSH normal. CBC normal. Dated 01/04/19: A1c 5.7% Dated 06/03/19: creatinine 1.32. CMET and CBC normal.  Dated 09/06/20: cholesterol 169, triglycerides 169, HDL 29, LDL 110, A1c 5.5%. creatinine 1.4. otherwise CMET normal. CBC normal. Dated 03/08/21: cholesterol 94, triglycerides 97, HDL 32, LDL 44. A1c 5.6%. creatinine 1.4. otherwise CBC, CMET and TSH normal. Dated 04/09/22: A1c 5.3%. creatinine 1.47. otherwise chemistries normal. Hgb 12.5.  Dated 5/10-/24: cholesterol 109, triglycerides 169, HDL 30, LDL 50.   Ecg not done today  Echo 07/09/18: IMPRESSIONS      1. The left ventricle has normal systolic function of 55-60%. The cavity size was normal. There is no increased left ventricular wall thickness. Left ventricular diastology could not be evaluated secondary to atrial fibrillation.  2. The right ventricle has normal systolic function. The cavity was mildly enlarged. There is no increase in right ventricular wall thickness.  3. Left atrial size was moderately dilated.  4. The mitral valve is normal in structure.  5. The tricuspid valve is normal in structure.  6. The aortic valve is normal in structure. Aortic valve  regurgitation is mild by color flow Doppler.  7. The pulmonic valve was normal in structure.  8. There is mild dilatation of the ascending aorta.   FINDINGS  Left Ventricle: The left ventricle has normal systolic function of 55-60%. The cavity size was normal. There is no increased left ventricular wall thickness. Left ventricular diastology could not be evaluated secondary to atrial fibrillation. Right Ventricle: The right ventricle has normal systolic function. The cavity was mildly enlarged. There is no increase in right ventricular wall thickness. Left Atrium: left atrial size was moderately dilated Right Atrium: right atrial size was normal in size Interatrial Septum: No atrial level shunt detected by color flow Doppler.  Echo 07/31/23: IMPRESSIONS     1. Left ventricular ejection fraction, by estimation, is 55 to 60%. The  left ventricle has normal function. The left ventricle has no regional  wall motion abnormalities. Left ventricular diastolic parameters are  indeterminate.   2. Right ventricular systolic function is normal. The right ventricular  size is mildly enlarged. There is mildly elevated pulmonary artery  systolic pressure.   3. Left atrial size was severely dilated.   4. Right atrial size was severely dilated.   5. The mitral valve is normal in structure. Mild mitral valve  regurgitation. No evidence of mitral stenosis.   6. The aortic valve is tricuspid. Aortic valve regurgitation is mild. No  aortic stenosis is present.   Comparison(s): Prior images reviewed side by side.  Atrial dilation from  prior.   Event monitor 08/28/23: Study Highlights      Atrial fibrillation with average rate 93 bpm, range 52-199.   Otherwise rare ectopy   Symptoms of lightheadedness appear to be associated with higher HR     Patch Wear Time:  13 days and 18 hours (2025-03-08T12:19:59-0500 to 2025-03-22T07:56:31-0400)   Atrial Fibrillation occurred continuously (100% burden),  ranging from 52-199 bpm (avg of 93 bpm). Isolated VEs were rare (<1.0%, 13874), VE Couplets were rare (<1.0%, 105), and VE Triplets were rare (<1.0%, 5). Ventricular Bigeminy and Trigeminy were  present.       Assessment / Plan: 1. Atrial fibrillation- persistent/ permanent.  He is asymptomatic. Rate overall ok on ILR On no rate slowing therapy.  Now on Eliquis  for anticoagulation. He was concerned over possible CHF. I am sure he has some diastolic dysfunction due to age and Afib but is not volume overloaded.   2. Recurrent episodes of altered mentation and visual changes. No clear etiology. ILR now in place. Ok'd him to drive some locally.   3. Mixed hyperlipidemia.   4. HTN- well controlled. On no meds  5. Normal cardiac cath 2009.  6.  History of GI bleed secondary to duodenal ulcers.   I will follow up in 6 months

## 2024-02-01 DIAGNOSIS — E782 Mixed hyperlipidemia: Secondary | ICD-10-CM | POA: Diagnosis not present

## 2024-02-01 DIAGNOSIS — I4819 Other persistent atrial fibrillation: Secondary | ICD-10-CM | POA: Diagnosis not present

## 2024-02-03 ENCOUNTER — Encounter: Payer: Self-pay | Admitting: Cardiology

## 2024-02-03 ENCOUNTER — Ambulatory Visit: Attending: Cardiology | Admitting: Cardiology

## 2024-02-03 VITALS — BP 137/78 | HR 67 | Ht 70.0 in | Wt 225.0 lb

## 2024-02-03 DIAGNOSIS — I4821 Permanent atrial fibrillation: Secondary | ICD-10-CM

## 2024-02-03 DIAGNOSIS — E785 Hyperlipidemia, unspecified: Secondary | ICD-10-CM

## 2024-02-03 DIAGNOSIS — G459 Transient cerebral ischemic attack, unspecified: Secondary | ICD-10-CM

## 2024-02-03 NOTE — Patient Instructions (Signed)
 Medication Instructions:  Continue all medications *If you need a refill on your cardiac medications before your next appointment, please call your pharmacy*  Lab Work: None ordered  Testing/Procedures: None ordered  Follow-Up: At [facility], you and your health needs are our priority.  As part of our continuing mission to provide you with exceptional heart care, our providers are all part of one team.  This team includes your primary Cardiologist (physician) and Advanced Practice Providers or APPs (Physician Assistants and Nurse Practitioners) who all work together to provide you with the care you need, when you need it.  Your next appointment:  6 months    Call in Nov to schedule March appointment    Provider:  Dr.Jordan   We recommend signing up for the patient portal called MyChart.  Sign up information is provided on this After Visit Summary.  MyChart is used to connect with patients for Virtual Visits (Telemedicine).  Patients are able to view lab/test results, encounter notes, upcoming appointments, etc.  Non-urgent messages can be sent to your provider as well.   To learn more about what you can do with MyChart, go to [email or web address].

## 2024-02-05 ENCOUNTER — Ambulatory Visit: Payer: Self-pay | Admitting: Cardiovascular Disease

## 2024-02-05 DIAGNOSIS — K529 Noninfective gastroenteritis and colitis, unspecified: Secondary | ICD-10-CM | POA: Diagnosis not present

## 2024-02-09 ENCOUNTER — Encounter

## 2024-02-12 DIAGNOSIS — M6281 Muscle weakness (generalized): Secondary | ICD-10-CM | POA: Diagnosis not present

## 2024-02-12 DIAGNOSIS — G6289 Other specified polyneuropathies: Secondary | ICD-10-CM | POA: Diagnosis not present

## 2024-02-12 DIAGNOSIS — M545 Low back pain, unspecified: Secondary | ICD-10-CM | POA: Diagnosis not present

## 2024-02-12 DIAGNOSIS — R269 Unspecified abnormalities of gait and mobility: Secondary | ICD-10-CM | POA: Diagnosis not present

## 2024-02-12 DIAGNOSIS — R2689 Other abnormalities of gait and mobility: Secondary | ICD-10-CM | POA: Diagnosis not present

## 2024-02-18 NOTE — Progress Notes (Signed)
 Remote Loop Recorder Transmission

## 2024-02-24 ENCOUNTER — Telehealth: Payer: Self-pay

## 2024-02-24 NOTE — Telephone Encounter (Signed)
 PT plan of care faxed to [facility]

## 2024-02-26 ENCOUNTER — Ambulatory Visit (INDEPENDENT_AMBULATORY_CARE_PROVIDER_SITE_OTHER)

## 2024-02-26 DIAGNOSIS — I4821 Permanent atrial fibrillation: Secondary | ICD-10-CM

## 2024-02-26 LAB — CUP PACEART REMOTE DEVICE CHECK
Date Time Interrogation Session: 20250925073014
Implantable Pulse Generator Implant Date: 20250724

## 2024-02-26 NOTE — Telephone Encounter (Signed)
 Plan of care faxed to [facility]

## 2024-02-27 DIAGNOSIS — M545 Low back pain, unspecified: Secondary | ICD-10-CM | POA: Diagnosis not present

## 2024-02-27 DIAGNOSIS — R2689 Other abnormalities of gait and mobility: Secondary | ICD-10-CM | POA: Diagnosis not present

## 2024-02-27 DIAGNOSIS — G6289 Other specified polyneuropathies: Secondary | ICD-10-CM | POA: Diagnosis not present

## 2024-02-27 DIAGNOSIS — M6281 Muscle weakness (generalized): Secondary | ICD-10-CM | POA: Diagnosis not present

## 2024-02-27 DIAGNOSIS — R269 Unspecified abnormalities of gait and mobility: Secondary | ICD-10-CM | POA: Diagnosis not present

## 2024-03-01 NOTE — Progress Notes (Signed)
 Remote Loop Recorder Transmission

## 2024-03-02 DIAGNOSIS — E782 Mixed hyperlipidemia: Secondary | ICD-10-CM | POA: Diagnosis not present

## 2024-03-02 DIAGNOSIS — I4819 Other persistent atrial fibrillation: Secondary | ICD-10-CM | POA: Diagnosis not present

## 2024-03-09 ENCOUNTER — Ambulatory Visit: Payer: Self-pay | Admitting: Cardiovascular Disease

## 2024-03-11 ENCOUNTER — Encounter

## 2024-03-23 DIAGNOSIS — K529 Noninfective gastroenteritis and colitis, unspecified: Secondary | ICD-10-CM | POA: Diagnosis not present

## 2024-03-29 ENCOUNTER — Ambulatory Visit (INDEPENDENT_AMBULATORY_CARE_PROVIDER_SITE_OTHER)

## 2024-03-29 DIAGNOSIS — I4821 Permanent atrial fibrillation: Secondary | ICD-10-CM | POA: Diagnosis not present

## 2024-03-29 LAB — CUP PACEART REMOTE DEVICE CHECK
Date Time Interrogation Session: 20251026235259
Implantable Pulse Generator Implant Date: 20250724

## 2024-03-30 ENCOUNTER — Ambulatory Visit: Payer: Self-pay | Admitting: Cardiology

## 2024-04-01 NOTE — Progress Notes (Signed)
 Remote Loop Recorder Transmission

## 2024-04-12 ENCOUNTER — Encounter

## 2024-04-22 ENCOUNTER — Ambulatory Visit: Admitting: Neurology

## 2024-04-29 ENCOUNTER — Encounter

## 2024-04-29 ENCOUNTER — Ambulatory Visit: Attending: Cardiovascular Disease

## 2024-04-29 DIAGNOSIS — G459 Transient cerebral ischemic attack, unspecified: Secondary | ICD-10-CM | POA: Diagnosis not present

## 2024-05-01 LAB — CUP PACEART REMOTE DEVICE CHECK
Date Time Interrogation Session: 20251126233952
Implantable Pulse Generator Implant Date: 20250724

## 2024-05-04 ENCOUNTER — Ambulatory Visit: Payer: Self-pay | Admitting: Cardiovascular Disease

## 2024-05-05 NOTE — Progress Notes (Signed)
 Remote Loop Recorder Transmission

## 2024-05-06 ENCOUNTER — Telehealth: Payer: Self-pay

## 2024-05-06 NOTE — Telephone Encounter (Signed)
 Alert remote transmission:  Symptom Symptom occurred 12/4 @ 00:19, EGM c/w AF with RVR.  Eliquis  per EPIC Increase in HR's per trends  Spoke with patient.  He reports waking up feeling weak, very thirsty and confused.  He was sweating extensively but denies any chest pain, shortness of breath, or stroke like symptoms.  After 30 minutes of drinking water and resting he returned to baseline.  Does not recall any palpitations.  Does report having a holiday party last night with rich foods and some alcohol and dancing.  Reports he does not drink very much water and that may have had an impact as well.    Timing of event correlates with AF/RVR events. Patient states he has had other milder symptom events in recent weeks. HR trends on graph show overall increase in median heart rates. Not currently on any rate control medication.  Has hx of taking carvedilol  per patient report, unsure why he was taken off.  Normally reports blood pressure is stable.  He did not take his vitals during sx events.   Forwarding to Dr. Jordan to make him aware, patient will wait for follow up call.

## 2024-05-06 NOTE — Telephone Encounter (Signed)
 Spoke to patient Dr.Jordan's advice given.

## 2024-05-07 ENCOUNTER — Other Ambulatory Visit (HOSPITAL_COMMUNITY): Payer: Self-pay | Admitting: Family Medicine

## 2024-05-07 DIAGNOSIS — R410 Disorientation, unspecified: Secondary | ICD-10-CM

## 2024-05-10 ENCOUNTER — Encounter: Payer: Self-pay | Admitting: Neurology

## 2024-05-10 ENCOUNTER — Ambulatory Visit (HOSPITAL_COMMUNITY)
Admission: RE | Admit: 2024-05-10 | Discharge: 2024-05-10 | Disposition: A | Source: Ambulatory Visit | Attending: Family Medicine

## 2024-05-10 ENCOUNTER — Ambulatory Visit: Admitting: Neurology

## 2024-05-10 VITALS — BP 148/88 | HR 93 | Resp 17 | Ht 70.0 in

## 2024-05-10 DIAGNOSIS — R4182 Altered mental status, unspecified: Secondary | ICD-10-CM | POA: Diagnosis not present

## 2024-05-10 DIAGNOSIS — G6289 Other specified polyneuropathies: Secondary | ICD-10-CM | POA: Diagnosis not present

## 2024-05-10 DIAGNOSIS — M545 Low back pain, unspecified: Secondary | ICD-10-CM | POA: Diagnosis not present

## 2024-05-10 DIAGNOSIS — G2581 Restless legs syndrome: Secondary | ICD-10-CM

## 2024-05-10 DIAGNOSIS — R410 Disorientation, unspecified: Secondary | ICD-10-CM

## 2024-05-10 DIAGNOSIS — G8929 Other chronic pain: Secondary | ICD-10-CM

## 2024-05-10 DIAGNOSIS — R269 Unspecified abnormalities of gait and mobility: Secondary | ICD-10-CM | POA: Diagnosis not present

## 2024-05-10 MED ORDER — PREGABALIN 100 MG PO CAPS: 400.0000 mg | ORAL_CAPSULE | Freq: Every day | ORAL | 3 refills | Status: AC

## 2024-05-10 MED ORDER — ROPINIROLE HCL 0.5 MG PO TABS: 0.5000 mg | ORAL_TABLET | Freq: Every evening | ORAL | 3 refills | Status: AC
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
## 2024-05-13 ENCOUNTER — Encounter

## 2024-05-30 ENCOUNTER — Ambulatory Visit: Attending: Cardiovascular Disease

## 2024-05-30 DIAGNOSIS — G459 Transient cerebral ischemic attack, unspecified: Secondary | ICD-10-CM | POA: Diagnosis not present

## 2024-05-31 ENCOUNTER — Encounter

## 2024-06-01 LAB — CUP PACEART REMOTE DEVICE CHECK
Date Time Interrogation Session: 20251227233525
Implantable Pulse Generator Implant Date: 20250724

## 2024-06-02 ENCOUNTER — Ambulatory Visit: Payer: Self-pay | Admitting: Cardiovascular Disease

## 2024-06-07 NOTE — Progress Notes (Signed)
 Remote Loop Recorder Transmission

## 2024-06-11 ENCOUNTER — Telehealth: Payer: Self-pay

## 2024-06-11 NOTE — Telephone Encounter (Addendum)
 Alert remote transmission: Symptom Event occurred 1/8 @ 12:27, EGM c/w AF with RVR - route to triage Permanent AF, Eliquis  er EPIC  ___________________________________________________________________  Patient known hx permanent AF. On OAC per EPIC. Overall rate controlled. Currently follows Dr. Jordan for AF management. Previously symptomatic in early Decemeber. Patient advised to drink water & stay hydrated as well and avoid ETOH.   Spoke w/ patient regarding recent symptom activation. Patient states he was getting ready to go to the theater when he felt confused, hot/sweaty, and fatigue/weakness. States he was with a nurse at the time. Patient laid down for approx 30 minutes and hydrated w/ water. States his symptoms went away. Notes symptoms associated w/ this episode were not as serious as what he has noted before. States medication compliance. Also states he drank small amount of coffee that morning and a couple bites of cereal. Patient does not believe he stays very well hydrated.   Advised patient to stay hydrated w/ water and to drink a glass of water first thing in the morning if possible. Verbalized understanding. Informed patient risks associated w/ caffeine or ETOH intake.   Informed patient to contact device clinic for any further questions or if symptoms reoccur or worsen. Verbalized understanding and appreciative for call.   Will forward to provider for awareness or further recommendations. F/U scheduled w/ Dr. Jordan on 08/03/2024 @ 1:20pm. Will continue to monitor and update accordingly.

## 2024-06-14 ENCOUNTER — Encounter

## 2024-06-30 ENCOUNTER — Ambulatory Visit

## 2024-06-30 DIAGNOSIS — G459 Transient cerebral ischemic attack, unspecified: Secondary | ICD-10-CM

## 2024-06-30 LAB — CUP PACEART REMOTE DEVICE CHECK
Date Time Interrogation Session: 20260127234324
Implantable Pulse Generator Implant Date: 20250724

## 2024-07-01 ENCOUNTER — Ambulatory Visit: Payer: Self-pay | Admitting: Cardiovascular Disease

## 2024-07-01 ENCOUNTER — Encounter

## 2024-07-07 NOTE — Telephone Encounter (Signed)
 SABRA

## 2024-07-08 NOTE — Progress Notes (Signed)
 Remote Loop Recorder Transmission

## 2024-07-09 NOTE — Telephone Encounter (Signed)
 ILR: for permanent AF Symptom activation  Patient reports feeling dizzy, lightheaded/faint, while exercising in a heated pool.   Became lethargic and was assisted out. VS: 120/71, HR 76, 02: 96%. Was given oral fluids, showered and rested at 8:45am on 07/08/24.   Symptom event is recorded at 2pm on 07/08/24, asked for clarification and if he flagged several hours later.  Regardless, there were no abnormal arrhythmias noted by device other than ongoing Afib.    Sent my chart message to follow up with patient.   Let him know that I would update Dr. Jordan of event but likely patient may have over-exerted self and/or was mildly dehydrated.

## 2024-07-15 ENCOUNTER — Encounter

## 2024-07-31 ENCOUNTER — Ambulatory Visit

## 2024-08-03 ENCOUNTER — Ambulatory Visit: Admitting: Cardiology

## 2024-08-16 ENCOUNTER — Encounter

## 2024-08-25 ENCOUNTER — Ambulatory Visit: Admitting: Neurology

## 2024-08-31 ENCOUNTER — Ambulatory Visit

## 2024-09-16 ENCOUNTER — Encounter

## 2024-10-18 ENCOUNTER — Encounter

## 2024-11-18 ENCOUNTER — Encounter

## 2025-01-12 ENCOUNTER — Ambulatory Visit: Admitting: Neurology
